# Patient Record
Sex: Female | Born: 1980 | Race: Black or African American | Hispanic: No | Marital: Single | Smoking: Former smoker
Health system: Southern US, Community
[De-identification: ages and names within clinical notes are randomized; demographics above are authoritative.]

## PROBLEM LIST (undated history)

## (undated) ENCOUNTER — Inpatient Hospital Stay (HOSPITAL_COMMUNITY): Payer: Self-pay

## (undated) DIAGNOSIS — F329 Major depressive disorder, single episode, unspecified: Secondary | ICD-10-CM

## (undated) DIAGNOSIS — F32A Depression, unspecified: Secondary | ICD-10-CM

## (undated) DIAGNOSIS — R7303 Prediabetes: Secondary | ICD-10-CM

## (undated) DIAGNOSIS — E669 Obesity, unspecified: Secondary | ICD-10-CM

## (undated) DIAGNOSIS — O119 Pre-existing hypertension with pre-eclampsia, unspecified trimester: Secondary | ICD-10-CM

## (undated) DIAGNOSIS — I10 Essential (primary) hypertension: Secondary | ICD-10-CM

## (undated) DIAGNOSIS — R0789 Other chest pain: Secondary | ICD-10-CM

## (undated) DIAGNOSIS — Z8619 Personal history of other infectious and parasitic diseases: Secondary | ICD-10-CM

## (undated) HISTORY — PX: DILATION AND CURETTAGE OF UTERUS: SHX78

## (undated) HISTORY — PX: WISDOM TOOTH EXTRACTION: SHX21

## (undated) HISTORY — DX: Pre-existing hypertension with pre-eclampsia, unspecified trimester: O11.9

## (undated) HISTORY — DX: Other chest pain: R07.89

## (undated) HISTORY — DX: Prediabetes: R73.03

## (undated) HISTORY — DX: Personal history of other infectious and parasitic diseases: Z86.19

## (undated) HISTORY — PX: CHOLECYSTECTOMY: SHX55

---
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
## 2000-01-21 ENCOUNTER — Encounter: Payer: Self-pay | Admitting: Emergency Medicine

## 2000-01-21 ENCOUNTER — Emergency Department (HOSPITAL_COMMUNITY): Admission: EM | Admit: 2000-01-21 | Discharge: 2000-01-21 | Payer: Self-pay | Admitting: Emergency Medicine

## 2000-06-14 ENCOUNTER — Emergency Department (HOSPITAL_COMMUNITY): Admission: EM | Admit: 2000-06-14 | Discharge: 2000-06-15 | Payer: Self-pay | Admitting: Emergency Medicine

## 2001-05-13 ENCOUNTER — Ambulatory Visit (HOSPITAL_COMMUNITY): Admission: RE | Admit: 2001-05-13 | Discharge: 2001-05-13 | Payer: Self-pay | Admitting: *Deleted

## 2001-10-01 ENCOUNTER — Inpatient Hospital Stay (HOSPITAL_COMMUNITY): Admission: AD | Admit: 2001-10-01 | Discharge: 2001-10-01 | Payer: Self-pay | Admitting: *Deleted

## 2001-10-16 ENCOUNTER — Inpatient Hospital Stay (HOSPITAL_COMMUNITY): Admission: AD | Admit: 2001-10-16 | Discharge: 2001-10-16 | Payer: Self-pay | Admitting: *Deleted

## 2001-10-16 ENCOUNTER — Encounter: Payer: Self-pay | Admitting: *Deleted

## 2001-10-23 ENCOUNTER — Encounter (HOSPITAL_COMMUNITY): Admission: RE | Admit: 2001-10-23 | Discharge: 2001-10-23 | Payer: Self-pay | Admitting: *Deleted

## 2001-10-23 ENCOUNTER — Inpatient Hospital Stay (HOSPITAL_COMMUNITY): Admission: AD | Admit: 2001-10-23 | Discharge: 2001-10-25 | Payer: Self-pay | Admitting: *Deleted

## 2002-06-23 ENCOUNTER — Ambulatory Visit (HOSPITAL_COMMUNITY): Admission: RE | Admit: 2002-06-23 | Discharge: 2002-06-23 | Payer: Self-pay | Admitting: Obstetrics and Gynecology

## 2002-12-20 ENCOUNTER — Inpatient Hospital Stay (HOSPITAL_COMMUNITY): Admission: AD | Admit: 2002-12-20 | Discharge: 2002-12-20 | Payer: Self-pay | Admitting: *Deleted

## 2002-12-20 ENCOUNTER — Encounter: Payer: Self-pay | Admitting: *Deleted

## 2002-12-23 ENCOUNTER — Inpatient Hospital Stay (HOSPITAL_COMMUNITY): Admission: AD | Admit: 2002-12-23 | Discharge: 2002-12-25 | Payer: Self-pay | Admitting: *Deleted

## 2003-07-28 ENCOUNTER — Emergency Department (HOSPITAL_COMMUNITY): Admission: EM | Admit: 2003-07-28 | Discharge: 2003-07-28 | Payer: Self-pay | Admitting: Emergency Medicine

## 2003-09-29 ENCOUNTER — Ambulatory Visit (HOSPITAL_COMMUNITY): Admission: RE | Admit: 2003-09-29 | Discharge: 2003-09-29 | Payer: Self-pay | Admitting: Obstetrics & Gynecology

## 2003-11-29 ENCOUNTER — Ambulatory Visit (HOSPITAL_COMMUNITY): Admission: RE | Admit: 2003-11-29 | Discharge: 2003-11-29 | Payer: Self-pay | Admitting: Obstetrics & Gynecology

## 2004-04-09 ENCOUNTER — Inpatient Hospital Stay (HOSPITAL_COMMUNITY): Admission: AD | Admit: 2004-04-09 | Discharge: 2004-04-09 | Payer: Self-pay | Admitting: Obstetrics

## 2004-04-26 ENCOUNTER — Inpatient Hospital Stay (HOSPITAL_COMMUNITY): Admission: AD | Admit: 2004-04-26 | Discharge: 2004-04-28 | Payer: Self-pay | Admitting: Obstetrics & Gynecology

## 2004-05-11 ENCOUNTER — Inpatient Hospital Stay (HOSPITAL_COMMUNITY): Admission: AD | Admit: 2004-05-11 | Discharge: 2004-05-11 | Payer: Self-pay | Admitting: Obstetrics

## 2008-01-30 ENCOUNTER — Ambulatory Visit: Payer: Self-pay | Admitting: Internal Medicine

## 2008-01-30 ENCOUNTER — Observation Stay (HOSPITAL_COMMUNITY): Admission: EM | Admit: 2008-01-30 | Discharge: 2008-01-31 | Payer: Self-pay | Admitting: Emergency Medicine

## 2008-01-30 ENCOUNTER — Ambulatory Visit: Payer: Self-pay | Admitting: Infectious Disease

## 2008-02-24 ENCOUNTER — Encounter (INDEPENDENT_AMBULATORY_CARE_PROVIDER_SITE_OTHER): Payer: Self-pay | Admitting: Internal Medicine

## 2008-02-24 ENCOUNTER — Ambulatory Visit: Payer: Self-pay | Admitting: Infectious Diseases

## 2008-02-24 DIAGNOSIS — J45901 Unspecified asthma with (acute) exacerbation: Secondary | ICD-10-CM | POA: Insufficient documentation

## 2008-02-24 DIAGNOSIS — R7309 Other abnormal glucose: Secondary | ICD-10-CM | POA: Insufficient documentation

## 2008-02-25 ENCOUNTER — Encounter (INDEPENDENT_AMBULATORY_CARE_PROVIDER_SITE_OTHER): Payer: Self-pay | Admitting: Internal Medicine

## 2008-02-25 LAB — CONVERTED CEMR LAB
ALT: 14 U/L
AST: 11 U/L
Albumin: 3.9 g/dL
Alkaline Phosphatase: 66 U/L
BUN: 11 mg/dL
Basophils Absolute: 0 K/uL
Basophils Relative: 0 %
CO2: 25 meq/L
Calcium: 9.2 mg/dL
Chloride: 104 meq/L
Creatinine, Ser: 0.64 mg/dL
Eosinophils Absolute: 0.7 K/uL
Eosinophils Relative: 9 % — ABNORMAL HIGH
Glucose, Bld: 81 mg/dL
HCT: 37.3 %
Hemoglobin: 11.5 g/dL — ABNORMAL LOW
Lymphocytes Relative: 36 %
Lymphs Abs: 2.8 K/uL
MCHC: 30.8 g/dL
MCV: 84.8 fL
Monocytes Absolute: 0.4 K/uL
Monocytes Relative: 6 %
Neutro Abs: 3.8 K/uL
Neutrophils Relative %: 49 %
Platelets: 380 K/uL
Potassium: 4.2 meq/L
RBC: 4.4 M/uL
RDW: 13.9 %
Sodium: 140 meq/L
TSH: 1.085 u[IU]/mL
Total Bilirubin: 0.3 mg/dL
Total Protein: 6.6 g/dL
WBC: 7.8 10*3/microliter

## 2008-02-26 ENCOUNTER — Encounter (INDEPENDENT_AMBULATORY_CARE_PROVIDER_SITE_OTHER): Payer: Self-pay | Admitting: Internal Medicine

## 2008-03-18 ENCOUNTER — Telehealth (INDEPENDENT_AMBULATORY_CARE_PROVIDER_SITE_OTHER): Payer: Self-pay | Admitting: Internal Medicine

## 2008-07-03 ENCOUNTER — Emergency Department (HOSPITAL_COMMUNITY): Admission: EM | Admit: 2008-07-03 | Discharge: 2008-07-03 | Payer: Self-pay | Admitting: Emergency Medicine

## 2008-07-05 ENCOUNTER — Ambulatory Visit (HOSPITAL_COMMUNITY): Admission: RE | Admit: 2008-07-05 | Discharge: 2008-07-05 | Payer: Self-pay | Admitting: *Deleted

## 2008-07-05 ENCOUNTER — Ambulatory Visit: Payer: Self-pay | Admitting: *Deleted

## 2008-07-05 DIAGNOSIS — S335XXA Sprain of ligaments of lumbar spine, initial encounter: Secondary | ICD-10-CM

## 2008-07-05 DIAGNOSIS — S339XXA Sprain of unspecified parts of lumbar spine and pelvis, initial encounter: Secondary | ICD-10-CM | POA: Insufficient documentation

## 2009-01-21 ENCOUNTER — Emergency Department (HOSPITAL_COMMUNITY): Admission: EM | Admit: 2009-01-21 | Discharge: 2009-01-21 | Payer: Self-pay | Admitting: Family Medicine

## 2009-02-17 ENCOUNTER — Telehealth: Payer: Self-pay | Admitting: Internal Medicine

## 2009-02-17 ENCOUNTER — Emergency Department (HOSPITAL_COMMUNITY): Admission: EM | Admit: 2009-02-17 | Discharge: 2009-02-17 | Payer: Self-pay | Admitting: Emergency Medicine

## 2009-07-14 ENCOUNTER — Emergency Department (HOSPITAL_COMMUNITY): Admission: EM | Admit: 2009-07-14 | Discharge: 2009-07-14 | Payer: Self-pay | Admitting: Family Medicine

## 2009-08-18 ENCOUNTER — Emergency Department (HOSPITAL_COMMUNITY): Admission: EM | Admit: 2009-08-18 | Discharge: 2009-08-18 | Payer: Self-pay | Admitting: Family Medicine

## 2010-02-05 ENCOUNTER — Ambulatory Visit: Payer: Self-pay | Admitting: Nurse Practitioner

## 2010-02-05 ENCOUNTER — Inpatient Hospital Stay (HOSPITAL_COMMUNITY): Admission: AD | Admit: 2010-02-05 | Discharge: 2010-02-05 | Payer: Self-pay | Admitting: Obstetrics and Gynecology

## 2010-03-14 ENCOUNTER — Ambulatory Visit (HOSPITAL_COMMUNITY)
Admission: RE | Admit: 2010-03-14 | Discharge: 2010-03-14 | Payer: Self-pay | Source: Home / Self Care | Admitting: Obstetrics & Gynecology

## 2010-04-02 IMAGING — CR DG CHEST 2V
2 series · 2 of 2 positions shown · non-contrast
Comparison: 07/03/2008

CLINICAL DATA: Cough.  Shortness of breath.  Asthma.

CHEST - 2 VIEW

[view not recorded (1 of 2)]
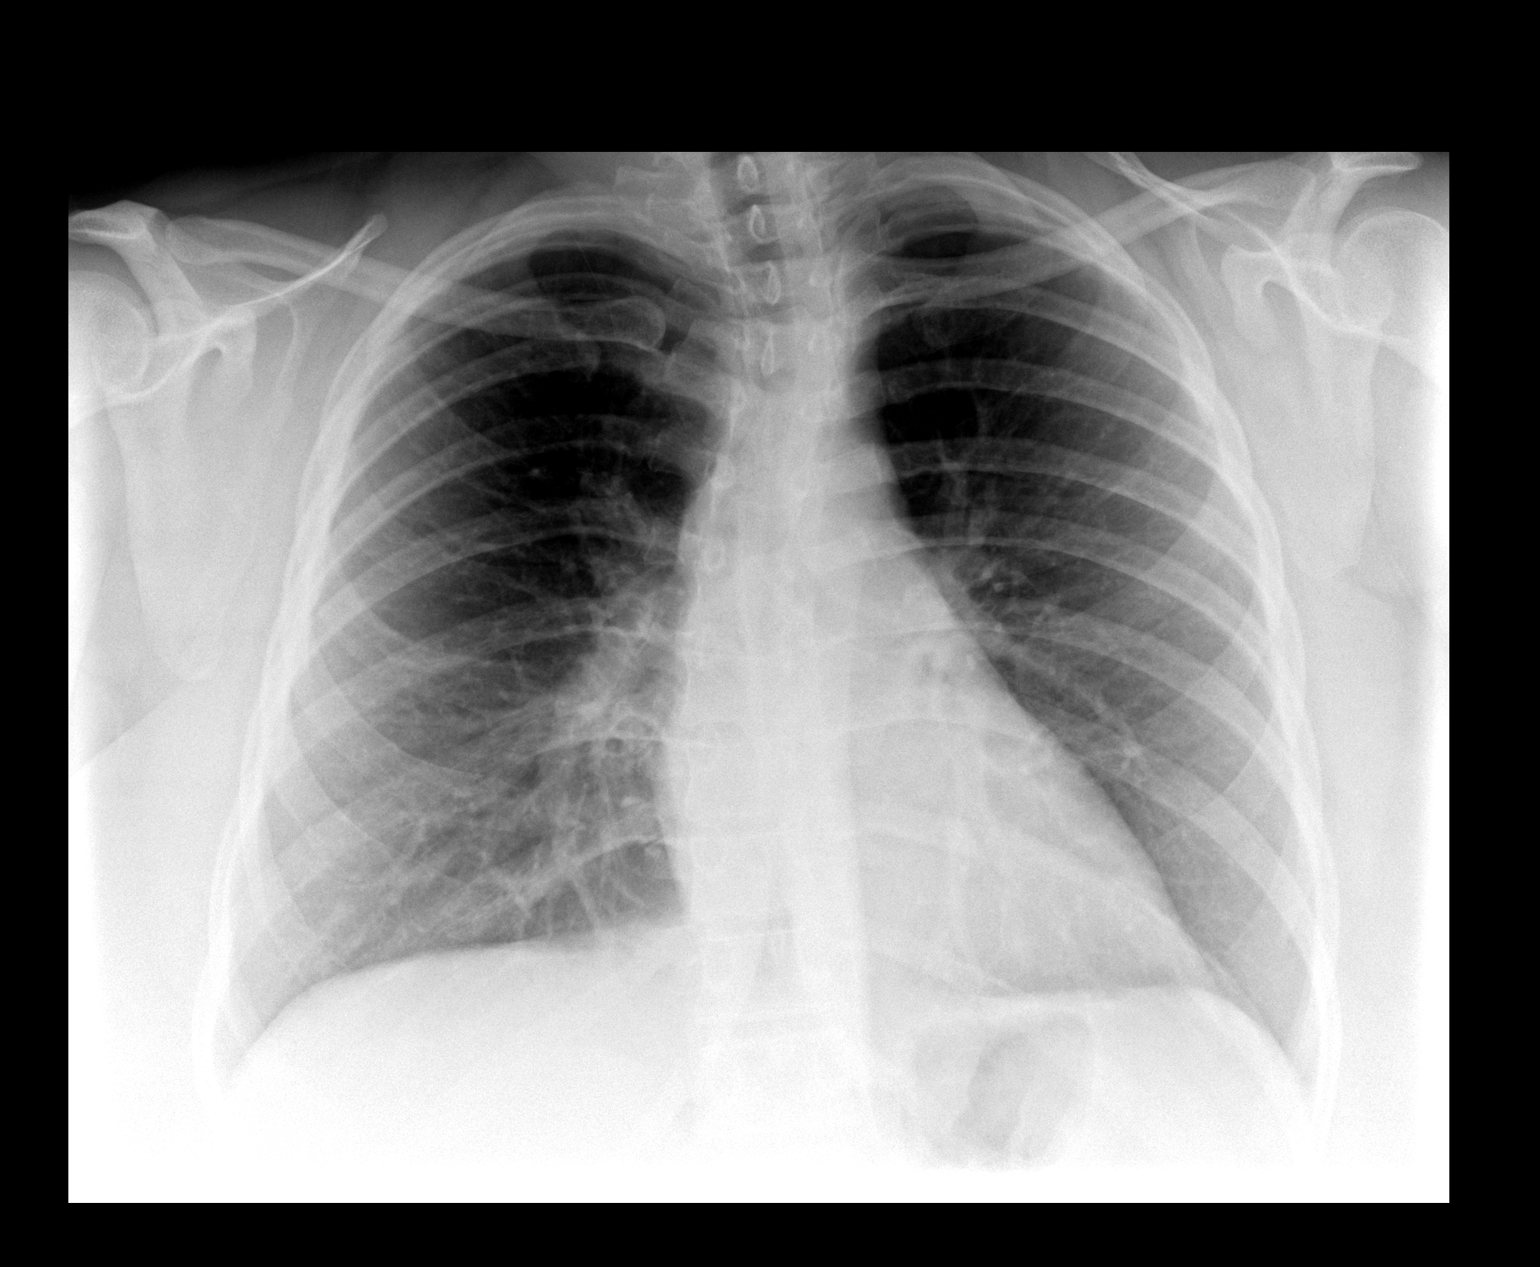

[view not recorded (2 of 2)]
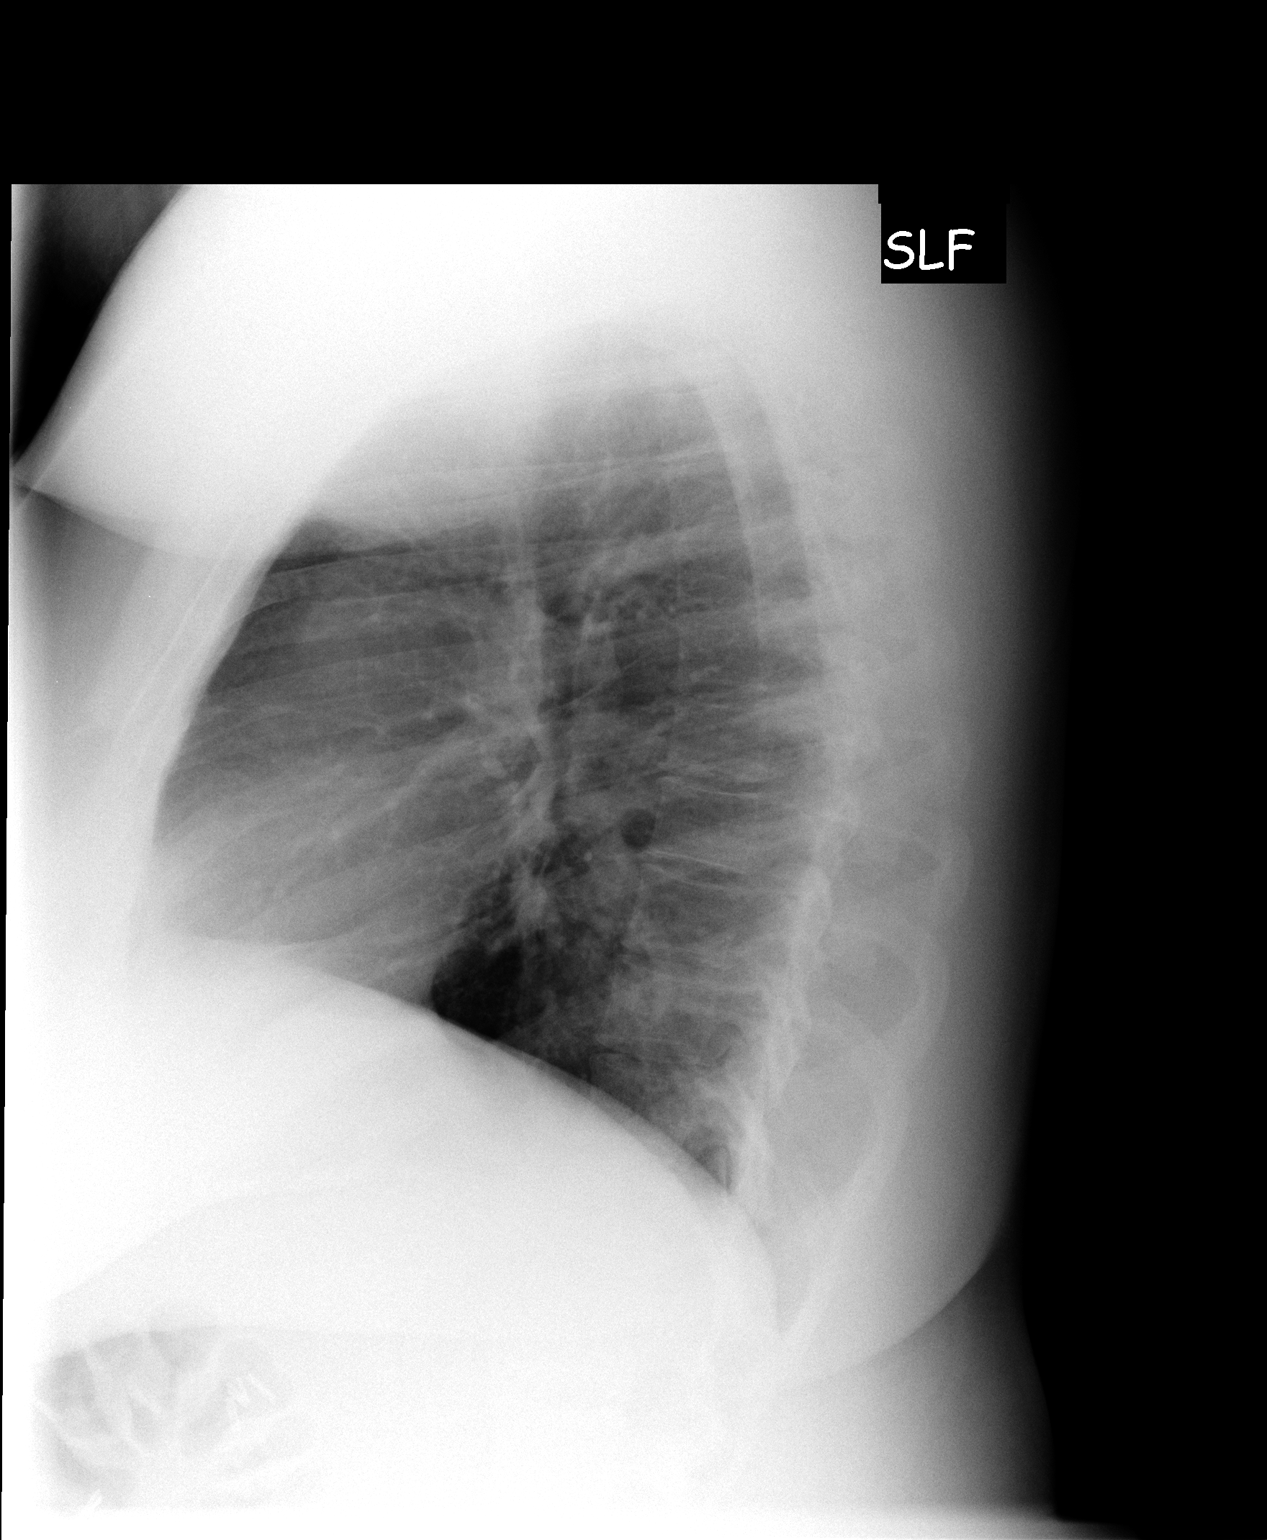

[2 of 2 positions shown; findings below may reference images not displayed]

FINDINGS: Heart size and mediastinal contours are normal.  Both
lungs are clear.  No evidence of pleural effusion.  No mass or
adenopathy identified.
IMPRESSION: Stable exam.  No active disease.

## 2010-04-29 ENCOUNTER — Encounter: Payer: Self-pay | Admitting: Obstetrics & Gynecology

## 2010-05-04 ENCOUNTER — Ambulatory Visit (HOSPITAL_COMMUNITY)
Admission: RE | Admit: 2010-05-04 | Discharge: 2010-05-04 | Payer: Self-pay | Source: Home / Self Care | Attending: Obstetrics | Admitting: Obstetrics

## 2010-06-20 LAB — URINALYSIS, ROUTINE W REFLEX MICROSCOPIC
Hgb urine dipstick: NEGATIVE
Ketones, ur: NEGATIVE mg/dL
Nitrite: NEGATIVE
Urobilinogen, UA: 0.2 mg/dL (ref 0.0–1.0)

## 2010-06-20 LAB — POCT PREGNANCY, URINE: Preg Test, Ur: POSITIVE

## 2010-06-26 LAB — POCT I-STAT, CHEM 8
BUN: 12 mg/dL (ref 6–23)
Chloride: 104 mEq/L (ref 96–112)
Glucose, Bld: 81 mg/dL (ref 70–99)
HCT: 38 % (ref 36.0–46.0)
Potassium: 4.3 mEq/L (ref 3.5–5.1)
Sodium: 142 mEq/L (ref 135–145)

## 2010-06-26 LAB — POCT URINALYSIS DIP (DEVICE)
Ketones, ur: NEGATIVE mg/dL
pH: 6 (ref 5.0–8.0)

## 2010-06-26 LAB — POCT PREGNANCY, URINE: Preg Test, Ur: NEGATIVE

## 2010-07-19 LAB — CBC
MCV: 82.1 fL (ref 78.0–100.0)
RBC: 4.02 MIL/uL (ref 3.87–5.11)
RDW: 14.2 % (ref 11.5–15.5)
WBC: 7.7 10*3/uL (ref 4.0–10.5)

## 2010-07-19 LAB — DIFFERENTIAL
Basophils Relative: 1 % (ref 0–1)
Eosinophils Relative: 3 % (ref 0–5)
Lymphocytes Relative: 26 % (ref 12–46)
Monocytes Relative: 6 % (ref 3–12)
Neutro Abs: 4.9 10*3/uL (ref 1.7–7.7)
Neutrophils Relative %: 64 % (ref 43–77)

## 2010-07-19 LAB — COMPREHENSIVE METABOLIC PANEL
AST: 18 U/L (ref 0–37)
CO2: 29 mEq/L (ref 19–32)
Calcium: 8.9 mg/dL (ref 8.4–10.5)
Chloride: 102 mEq/L (ref 96–112)
Creatinine, Ser: 0.6 mg/dL (ref 0.4–1.2)
GFR calc non Af Amer: >60 mL/min (ref >60)
Sodium: 135 mEq/L (ref 135–145)

## 2010-07-19 LAB — URINALYSIS, ROUTINE W REFLEX MICROSCOPIC
Bilirubin Urine: NEGATIVE
Nitrite: NEGATIVE
pH: 6 (ref 5.0–8.0)

## 2010-08-24 ENCOUNTER — Inpatient Hospital Stay (HOSPITAL_COMMUNITY)
Admission: AD | Admit: 2010-08-24 | Discharge: 2010-08-24 | Disposition: A | Discharge status: Home / Self Care | Payer: Self-pay | Source: Ambulatory Visit | Attending: Obstetrics | Admitting: Obstetrics

## 2010-08-24 DIAGNOSIS — O212 Late vomiting of pregnancy: Secondary | ICD-10-CM | POA: Insufficient documentation

## 2010-08-24 DIAGNOSIS — M545 Low back pain, unspecified: Secondary | ICD-10-CM | POA: Insufficient documentation

## 2010-08-24 LAB — URINALYSIS, ROUTINE W REFLEX MICROSCOPIC
Bilirubin Urine: NEGATIVE
Glucose, UA: NEGATIVE mg/dL
Hgb urine dipstick: NEGATIVE
Ketones, ur: NEGATIVE mg/dL
Protein, ur: NEGATIVE mg/dL

## 2010-08-24 NOTE — Discharge Summary (Signed)
Burns, Crystal NO.:  [id number]   MEDICAL RECORD NO.:  [id number]          PATIENT TYPE:  OBV   LOCATION:  5003                         FACILITY:  [facility]   PHYSICIAN:  Acey Lav, MD  DATE OF BIRTH:  [birth year]   DATE OF ADMISSION:  01/30/2008  DATE OF DISCHARGE:  01/31/2008                               DISCHARGE SUMMARY   DISCHARGE DIAGNOSES:  1. Shortness of breath likely secondary to acute upper respiratory      illness/bronchitis with asthmatic component.  2. Hypertension.  3. Likely prediabetic as the patient had a hemoglobin A1c of 6.1.   DISCHARGE MEDICATIONS:  1. p.o. azithromycin 250 mg daily x4 days.  2. p.o. Tamiflu 75 mg b.i.d. x5 days.  3. p.o. prednisone 60 mg daily x5 days.  4. Symbicort inhaler 160/4.5 one puff b.i.d.  This medication was      provided by urgent care.  5. Ventolin 90 mcg inhaler 2 puffs q.6 h. p.r.n. shortness of breath.      This medication was provided by urgent care.   CONDITION ON DISCHARGE:  The patient's dyspnea had improved  substantially with the albuterol and Atrovent nebulizers as well as the  IV Solu-Medrol that the patient had received during the hospitalization.  The patient is to follow up with Dr. Elby Showers at the outpatient  clinic on February 24, 2008, at 1:30 p.m.  She has a BMET scheduled for  that time.  Please evaluate the patient's recovery from the upper  respiratory illness/asthma exacerbation.  Please also evaluate her for  hypertension as she had been started on Cozaar by the urgent care that  she had seen prior to hospitalization.  Please also follow her in the  outpatient clinic for potential diabetes as the patient's hemoglobin A1c  was 6.1.   PROCEDURES:  The patient had a peak flow done at [facility] on  the morning of January 31, 2008.  The patient had a chest x-ray done  that showed no acute cardiopulmonary process.  Low volume exam.   CONSULTATIONS:  None.   ADMISSION HISTORY AND PHYSICAL:  The patient is a 30 year old female  smoker with past medical history significant for hypertension, once-a-  year bronchitis, no asthma history, who presents with shortness of  breath, cough with productive yellow sputum, rhinorrhea, and sneezing x3  days.  The patient denies any fevers or chills or diffuse myalgias.  The  patient seen at urgent care 2 days prior to admission and diagnosed with  acute bronchitis/possible asthma and prescribed amoxicillin, Symbicort,  and Ventolin inhalers.  The patient also took daughter's amoxicillin.  The patient's only sick contact is her daughter, who had the flu 2 weeks  prior to admission.  The patient works as a Conservation officer, nature at [employer].  The  patient became acutely worse on the morning of admission and this  prompted her to come to the emergency department.   PHYSICAL EXAMINATION:  VITAL SIGNS:  Temperature of 97.5, blood pressure  of 93/63, it came up to 103/72 in the ED.  Pulse of 105, respiratory  rate of 22,  and O2 sats of 98% on 4 L.  GENERAL APPEARANCE:  Short of breath, obese young female.  HEENT:  Eyes, anicteric.  Extraocular muscles intact.  ENT, moist  membranous mucosa.  RESPIRATORY:  Decreased air movement.  Mild inspiratory and expiratory  wheezes.  Use of accessory muscles.  No crackles.  No rhonchi.  CARDIOVASCULAR:  Tachycardic, regular rhythm.  No murmurs, rubs, or  gallops.  GI:  Obese, nontender, normoactive bowel sounds.  EXTREMITIES:  No cyanosis, clubbing, or edema.  NEUROLOGIC:  Nonfocal.   INITIAL LABORATORY DATA:  The patient's initial CMET; sodium of 137,  potassium 4.3, chloride of 101, bicarbonate 27, glucose 133, BUN of 8,  creatinine of 0.65.  Alkaline phosphatase 77, AST 23, ALT 28.  Calcium  9.2.  Initial CBC; white blood cell count of 12.1, hemoglobin 11.9,  hematocrit 35.5, platelet count of 296.  Venous blood gas, pH 7.37, PCO2  of 47.1, PO2 of 161, bicarbonate 27.6, and O2  saturation of 99.0.   HOSPITAL COURSE:  1. Upper respiratory illness with likely asthmatic component:  The      patient's chest x-ray did not show any focal infiltrate, making a      pneumonia much less likely.  Given her symptoms and progression of      her disease, it sounded like an upper respiratory illness from a      virus etiology was most likely.  She was covered with Tamiflu and      we will continue with that as an outpatient for a full 5-day      course.  Given the possibility of bronchitis or atypical pneumonia,      although less likely, she was started on azithromycin and we will      continue with that as an outpatient.  Because the patient exhibited      wheezing on physical exam, she was started on Atrovent and      albuterol nebulizers q.4 h. scheduled and q.2 h. p.r.n.  With this,      she improved dramatically overnight and was close to her baseline      upon discharge on January 31, 2008.  Given that she has no history      of asthma, but has once-a-year episodes of bronchitis, she should      be evaluated as an outpatient for asthma with PFTs.  2. Hypertension:  The patient's initial blood pressure was borderline      low at 103/72, however, her blood pressure did increase to as high      as 149/94.  She had been prescribed Cozaar as an outpatient at the      urgent care.  However, this may not be the best first line      treatment for hypertension, and she should be reevaluated on an      outpatient basis for continued care.  3. Hemoglobin A1c of 6.1:  The patient does not have a history of      diabetes.  However, she has multiple risk factors including family      history significant for diabetes as well as obesity.  She should be      evaluated as an outpatient for her potential diabetes and treated      with lifestyle changes or medications accordingly.   DISCHARGE VITALS:  The patient's vitals on discharge, temperature 98.7,  pulse of 88, respiratory rate of  18, saturations of 97%-100% on room  air, and blood  pressure of 133/84.   DISCHARGE LABS:  BMET; sodium 138, potassium 4.0, chloride 106,  bicarbonate 23, glucose 159, BUN 7, creatinine 0.58.  CBC; white blood  cell count 24.7, likely secondary to steroids, hemoglobin 1.4,  hematocrit 34.3, and platelets of 323.   PENDING LABS:  There are no pending labs at this time.      Linward Foster, MD  Electronically Signed      Acey Lav, MD  Electronically Signed    LW/MEDQ  D:  02/01/2008  T:  02/02/2008  Job:  [id number]   cc:   Elby Showers, MD

## 2010-09-06 ENCOUNTER — Other Ambulatory Visit: Payer: Self-pay | Admitting: Obstetrics & Gynecology

## 2010-09-06 DIAGNOSIS — I10 Essential (primary) hypertension: Secondary | ICD-10-CM

## 2010-09-11 ENCOUNTER — Ambulatory Visit (HOSPITAL_COMMUNITY)
Admission: RE | Admit: 2010-09-11 | Discharge: 2010-09-11 | Disposition: A | Discharge status: Home / Self Care | Payer: Medicaid Other | Source: Ambulatory Visit | Attending: Obstetrics & Gynecology | Admitting: Obstetrics & Gynecology

## 2010-09-11 ENCOUNTER — Other Ambulatory Visit: Payer: Self-pay | Admitting: Obstetrics & Gynecology

## 2010-09-11 ENCOUNTER — Other Ambulatory Visit: Payer: Self-pay | Admitting: Obstetrics

## 2010-09-11 DIAGNOSIS — O09299 Supervision of pregnancy with other poor reproductive or obstetric history, unspecified trimester: Secondary | ICD-10-CM | POA: Insufficient documentation

## 2010-09-11 DIAGNOSIS — O24419 Gestational diabetes mellitus in pregnancy, unspecified control: Secondary | ICD-10-CM

## 2010-09-11 DIAGNOSIS — O365990 Maternal care for other known or suspected poor fetal growth, unspecified trimester, not applicable or unspecified: Secondary | ICD-10-CM | POA: Insufficient documentation

## 2010-09-11 DIAGNOSIS — O99810 Abnormal glucose complicating pregnancy: Secondary | ICD-10-CM | POA: Insufficient documentation

## 2010-09-11 DIAGNOSIS — I10 Essential (primary) hypertension: Secondary | ICD-10-CM

## 2010-09-11 DIAGNOSIS — O10019 Pre-existing essential hypertension complicating pregnancy, unspecified trimester: Secondary | ICD-10-CM | POA: Insufficient documentation

## 2010-09-11 DIAGNOSIS — E669 Obesity, unspecified: Secondary | ICD-10-CM | POA: Insufficient documentation

## 2010-09-18 ENCOUNTER — Inpatient Hospital Stay (HOSPITAL_COMMUNITY): Admission: RE | Admit: 2010-09-18 | Payer: Medicaid Other | Source: Ambulatory Visit

## 2010-09-26 ENCOUNTER — Inpatient Hospital Stay (HOSPITAL_COMMUNITY)
Admission: EM | Admit: 2010-09-26 | Discharge: 2010-09-28 | DRG: 775 | Disposition: A | Discharge status: Home / Self Care | Payer: Medicaid Other | Source: Ambulatory Visit | Attending: Obstetrics | Admitting: Obstetrics

## 2010-09-26 ENCOUNTER — Encounter (HOSPITAL_COMMUNITY): Payer: Self-pay | Admitting: *Deleted

## 2010-09-26 DIAGNOSIS — O99892 Other specified diseases and conditions complicating childbirth: Secondary | ICD-10-CM | POA: Diagnosis present

## 2010-09-26 DIAGNOSIS — O99814 Abnormal glucose complicating childbirth: Principal | ICD-10-CM | POA: Diagnosis present

## 2010-09-26 DIAGNOSIS — O24419 Gestational diabetes mellitus in pregnancy, unspecified control: Secondary | ICD-10-CM

## 2010-09-26 DIAGNOSIS — Z22330 Carrier of Group B streptococcus: Secondary | ICD-10-CM

## 2010-09-26 LAB — GLUCOSE, CAPILLARY: Glucose-Capillary: 105 mg/dL — ABNORMAL HIGH (ref 70–99)

## 2010-09-26 LAB — CBC
HCT: 31.2 % — ABNORMAL LOW (ref 36.0–46.0)
MCH: 24.7 pg — ABNORMAL LOW (ref 26.0–34.0)
MCV: 77.8 fL — ABNORMAL LOW (ref 78.0–100.0)
RDW: 15.5 % (ref 11.5–15.5)
WBC: 8.1 10*3/uL (ref 4.0–10.5)

## 2010-09-27 LAB — CBC
HCT: 31 % — ABNORMAL LOW (ref 36.0–46.0)
Platelets: 260 10*3/uL (ref 150–400)
RDW: 15.5 % (ref 11.5–15.5)
WBC: 11 10*3/uL — ABNORMAL HIGH (ref 4.0–10.5)

## 2010-09-27 LAB — ABO/RH: ABO/RH(D): O POS

## 2010-09-27 LAB — GLUCOSE, CAPILLARY: Glucose-Capillary: 72 mg/dL (ref 70–99)

## 2010-10-01 NOTE — H&P (Signed)
  NAMEWILHELMENA, ZEA NO.:  [id number]  MEDICAL RECORD NO.:  [id number]  LOCATION:  9163                          FACILITY:  [facility]  PHYSICIAN:  Roseanna Rainbow, M.D.DATE OF BIRTH:  Jun 10, 1980  DATE OF ADMISSION:  09/26/2010 DATE OF DISCHARGE:                             HISTORY & PHYSICAL   CHIEF COMPLAINT:  The patient is a 30 year old para 4 with an estimated date of confinement of October 03, 2010, with an intrauterine pregnancy at 37 plus weeks for induction of labor secondary to the gestational diabetes on glyburide.  HISTORY OF PRESENT ILLNESS:  Please see the above.  ALLERGIES:  No known drug allergies.  MEDICATIONS:  Glyburide.  OB RISK FACTORS:  History of chronic hypertension.  Please see the above.  The patient was initially on Aldomet for blood pressure control; however, the blood pressures nadir in the second trimester and the blood pressure medications were discontinued.  PRENATAL LABS:  Chlamydia probe negative.  Urine culture and sensitivity insignificant growth.  GC probe negative.  GBS positive on Aug 31, 2010. 2-hour GTT abnormal.  Hepatitis B surface antigen negative.  Hematocrit 36, hemoglobin 11.7.  Hemoglobin A1c December 2011 of 6, platelets 358,000.  Quad screen negative.  Blood type O positive.  Antibody screen negative.  RPR nonreactive.  Rubella immune.  Sickle cell negative.  PAST OB HISTORY:  In October 1999, she was delivered at term 5 pounds 10 ounces female vaginal delivery.  In February 2003, she was delivered at 41 weeks 7 pounds 10 ounces female vaginal delivery.  In May 2004, she was delivered a 39-week 6 pounds 6 ounces female vaginal delivery.  In January 2006, she was delivered at term 6 pounds 11 ounces female vaginal delivery.  SOCIAL HISTORY:  She denies any tobacco, ethanol, or drug abuse.  PAST MEDICAL HISTORY:  Please see the above; asthma, anemia.  PHYSICAL EXAMINATION:  Vital signs stable, afebrile.   Fetal heart tracing 140.  Moderate long-term variability.  Tocodynamometer no uterine contractions.  Vulva and vaginal exam per the RN.  ASSESSMENT:  Multipara at term with the pregnancy complicated by gestational diabetes on an oral agent.  The patient has been well- controlled.  The infant is AGA on recent ultrasound.  Other comorbidities; history of chronic hypertension, now currently without medications and normotensive.  Category one fetal heart tracing, unfavorable Bishop score.  GBS positive, penicillin GBS prophylaxis in labor.  PLAN:  Admission, two-stage induction of labor.  Continue glyburide during the wiping process.  Check CBGs.  ADA diet.  Anticipate a vaginal delivery.     Roseanna Rainbow, M.D.     Judee Clara  D:  09/26/2010  T:  09/26/2010  Job:  [id number]  Electronically Signed by Antionette Char M.D. on 10/01/2010 10:56:22 AM

## 2011-01-08 LAB — DIFFERENTIAL
Basophils Absolute: 0
Basophils Relative: 0
Eosinophils Absolute: 0.8 — ABNORMAL HIGH
Eosinophils Relative: 7 — ABNORMAL HIGH
Monocytes Absolute: 0.2
Monocytes Relative: 1 — ABNORMAL LOW
Neutro Abs: 9 — ABNORMAL HIGH

## 2011-01-08 LAB — POCT I-STAT 3, VENOUS BLOOD GAS (G3P V)
Acid-Base Excess: 2
Bicarbonate: 27.6 — ABNORMAL HIGH
pH, Ven: 7.376 — ABNORMAL HIGH

## 2011-01-08 LAB — POCT I-STAT, CHEM 8
Calcium, Ion: 1.14
Chloride: 104
Glucose, Bld: 127 — ABNORMAL HIGH
HCT: 37
Hemoglobin: 12.6
TCO2: 26

## 2011-01-08 LAB — CBC
HCT: 34.3 — ABNORMAL LOW
Hemoglobin: 11.9 — ABNORMAL LOW
MCHC: 33.1
MCHC: 33.5
MCV: 81.7
MCV: 82.9
RBC: 4.14
RBC: 4.35
RDW: 14
WBC: 12.1 — ABNORMAL HIGH
WBC: 24.7 — ABNORMAL HIGH

## 2011-01-08 LAB — BASIC METABOLIC PANEL
BUN: 7
CO2: 23
Chloride: 106
GFR calc Af Amer: >60
Potassium: 4

## 2011-01-08 LAB — COMPREHENSIVE METABOLIC PANEL
AST: 23
Albumin: 3.8
Alkaline Phosphatase: 77
BUN: 8
Chloride: 101
GFR calc Af Amer: >60
Potassium: 4.3
Total Protein: 7.2

## 2011-01-08 LAB — HEMOGLOBIN A1C: Mean Plasma Glucose: 128

## 2011-04-05 ENCOUNTER — Emergency Department (HOSPITAL_COMMUNITY)
Admission: EM | Admit: 2011-04-05 | Discharge: 2011-04-05 | Disposition: A | Discharge status: Home / Self Care | Payer: Medicaid Other | Attending: Emergency Medicine | Admitting: Emergency Medicine

## 2011-04-05 ENCOUNTER — Encounter (HOSPITAL_COMMUNITY): Payer: Self-pay

## 2011-04-05 DIAGNOSIS — R059 Cough, unspecified: Secondary | ICD-10-CM | POA: Insufficient documentation

## 2011-04-05 DIAGNOSIS — J3489 Other specified disorders of nose and nasal sinuses: Secondary | ICD-10-CM | POA: Insufficient documentation

## 2011-04-05 DIAGNOSIS — R220 Localized swelling, mass and lump, head: Secondary | ICD-10-CM | POA: Insufficient documentation

## 2011-04-05 DIAGNOSIS — R05 Cough: Secondary | ICD-10-CM | POA: Insufficient documentation

## 2011-04-05 DIAGNOSIS — R5381 Other malaise: Secondary | ICD-10-CM | POA: Insufficient documentation

## 2011-04-05 DIAGNOSIS — J069 Acute upper respiratory infection, unspecified: Secondary | ICD-10-CM | POA: Insufficient documentation

## 2011-04-05 DIAGNOSIS — H9209 Otalgia, unspecified ear: Secondary | ICD-10-CM | POA: Insufficient documentation

## 2011-04-05 DIAGNOSIS — R070 Pain in throat: Secondary | ICD-10-CM | POA: Insufficient documentation

## 2011-04-05 DIAGNOSIS — R062 Wheezing: Secondary | ICD-10-CM | POA: Insufficient documentation

## 2011-04-05 DIAGNOSIS — I10 Essential (primary) hypertension: Secondary | ICD-10-CM | POA: Insufficient documentation

## 2011-04-05 HISTORY — DX: Essential (primary) hypertension: I10

## 2011-04-05 MED ORDER — BENZONATATE 100 MG PO CAPS: 100.0000 mg | ORAL_CAPSULE | Freq: Three times a day (TID) | ORAL | Status: AC

## 2011-04-05 NOTE — ED Provider Notes (Signed)
History     CSN: [id number]  Arrival date & time 04/05/11  1535   First MD Initiated Contact with Patient 04/05/11 1603      Chief Complaint  Patient presents with  . Cough    (Consider location/radiation/quality/duration/timing/severity/associated sxs/prior treatment) HPI Comments: Patient with 48-hour history of nasal congestion, sore throat, cough. Patient denies fever or vomiting. She states that she has a history of bronchitis and has been having some wheezing. She has an albuterol inhaler at home that she has used twice. She also reports using various over-the-counter cough and congestion medications which give mild relief. The patient's daughters have been sick with similar symptoms over the past week.  Patient is a 30 y.o. female presenting with URI. The history is provided by the patient.  URI The primary symptoms include fatigue, ear pain, sore throat, cough and wheezing. Primary symptoms do not include fever, headaches, abdominal pain, nausea, vomiting, myalgias or rash. The current episode started 2 days ago. This is a new problem.  Symptoms associated with the illness include plugged ear sensation, sinus pressure, congestion and rhinorrhea. The illness is not associated with chills or facial pain.    Past Medical History  Diagnosis Date  . Hypertension     No past surgical history on file.  No family history on file.  History  Substance Use Topics  . Smoking status: Not on file  . Smokeless tobacco: Not on file  . Alcohol Use:     OB History    Grav Para Term Preterm Abortions TAB SAB Ect Mult Living   1               Review of Systems  Constitutional: Positive for fatigue. Negative for fever and chills.  HENT: Positive for ear pain, congestion, sore throat, rhinorrhea and sinus pressure.   Eyes: Negative for discharge.  Respiratory: Positive for cough and wheezing. Negative for shortness of breath.   Cardiovascular: Negative for chest pain.    Gastrointestinal: Negative for nausea, vomiting, abdominal pain, diarrhea and constipation.  Genitourinary: Negative for dysuria.  Musculoskeletal: Negative for myalgias.  Skin: Negative for rash.  Neurological: Negative for headaches.  Psychiatric/Behavioral: Negative for confusion.    Allergies  Review of patient's allergies indicates no known allergies.  Home Medications   Current Outpatient Rx  Name Route Sig Dispense Refill  . ACETAMINOPHEN-GUAIFENESIN 500-200 MG/15ML PO LIQD Oral Take 15 mLs by mouth every 4 (four) hours as needed. For cough and cold     . AMLODIPINE BESYLATE 5 MG PO TABS Oral Take 5 mg by mouth daily.      . AZITHROMYCIN 500 MG PO TABS Oral Take 500 mg by mouth daily.        BP 160/104  Pulse 91  Temp(Src) 97.1 F (36.2 C) (Oral)  Resp 24  SpO2 98%  Physical Exam  Nursing note and vitals reviewed. Constitutional: She is oriented to person, place, and time. She appears well-developed and well-nourished.  HENT:  Head: Normocephalic and atraumatic.  Right Ear: Tympanic membrane and external ear normal.  Left Ear: Tympanic membrane and external ear normal.  Nose: Mucosal edema and rhinorrhea present. Right sinus exhibits frontal sinus tenderness. Left sinus exhibits frontal sinus tenderness.  Mouth/Throat: Uvula is midline and mucous membranes are normal. Posterior oropharyngeal erythema present. No oropharyngeal exudate.  Eyes: Pupils are equal, round, and reactive to light. Right eye exhibits no discharge. Left eye exhibits no discharge.  Neck: Normal range of motion. Neck supple.  Cardiovascular: Normal rate and regular rhythm.  Exam reveals no gallop and no friction rub.   No murmur heard. Pulmonary/Chest: Effort normal and breath sounds normal. No respiratory distress. She has no wheezes.  Abdominal: Soft. There is no tenderness. There is no rebound and no guarding.  Musculoskeletal: Normal range of motion.  Neurological: She is alert and oriented  to person, place, and time.  Skin: Skin is warm and dry. No rash noted.  Psychiatric: She has a normal mood and affect.    ED Course  Procedures (including critical care time)  Labs Reviewed - No data to display No results found.   1. Upper respiratory tract infection   2. Hypertension     4:24 PM Pt seen and examined.   4:25 PM Patient counseled on supportive care for viral URI and s/s to return including worsening symptoms, persistent fever, persistent vomiting, or if they have any other concerns.  Urged to see PCP if symptoms persist for more than 3 days. Patient verbalizes understanding and agrees with plan.   4:25 PM Patient counseled on use of albuterol HFA.  Told to use 1-2 puffs q 4 hours as needed for SOB.    MDM  Patient with symptoms consistent with a viral syndrome.  Vitals are stable, no fever.  No signs of dehydration.  Lung exam normal, no signs of pneumonia.  Supportive therapy indicated with return if symptoms worsen.  Patient is stable and appears well for discharge. Asked patient to take her blood pressure medications as directed. Her hypertension today is likely a result of her not taking her blood pressure medications this morning. Do not suspect any complications of high blood pressure at this time.    Medical screening examination/treatment/procedure(s) were performed by non-physician practitioner and as supervising physician I was immediately available for consultation/collaboration. Osvaldo Human, M.D.      Eustace Moore [place], [place] 04/05/11 1637  Carleene Cooper III, MD 04/06/11 [phone]

## 2011-04-05 NOTE — ED Notes (Signed)
Pt reports cough/congestion x 3 days.  C/o chest pain due to cough.  Reports tactile fever--temp not taken at home.

## 2011-05-01 ENCOUNTER — Emergency Department (INDEPENDENT_AMBULATORY_CARE_PROVIDER_SITE_OTHER)
Admission: EM | Admit: 2011-05-01 | Discharge: 2011-05-01 | Disposition: A | Discharge status: Home / Self Care | Payer: Medicaid Other | Source: Home / Self Care | Attending: Emergency Medicine | Admitting: Emergency Medicine

## 2011-05-01 DIAGNOSIS — J45901 Unspecified asthma with (acute) exacerbation: Secondary | ICD-10-CM

## 2011-05-01 MED ORDER — PREDNISONE 20 MG PO TABS
ORAL_TABLET | ORAL | Status: AC
  Filled 2011-05-01: qty 3

## 2011-05-01 MED ORDER — PREDNISONE 20 MG PO TABS: 40.0000 mg | ORAL_TABLET | Freq: Every day | ORAL | Status: AC

## 2011-05-01 MED ORDER — PREDNISONE 20 MG PO TABS
60.0000 mg | ORAL_TABLET | Freq: Once | ORAL | Status: AC
  Administered 2011-05-01: 60 mg via ORAL

## 2011-05-01 MED ORDER — ALBUTEROL SULFATE (5 MG/ML) 0.5% IN NEBU
5.0000 mg | INHALATION_SOLUTION | Freq: Once | RESPIRATORY_TRACT | Status: AC
  Administered 2011-05-01: 5 mg via RESPIRATORY_TRACT

## 2011-05-01 MED ORDER — ALBUTEROL SULFATE (5 MG/ML) 0.5% IN NEBU: 5.0000 mg | INHALATION_SOLUTION | Freq: Once | RESPIRATORY_TRACT | Status: DC

## 2011-05-01 MED ORDER — ALBUTEROL SULFATE (5 MG/ML) 0.5% IN NEBU
INHALATION_SOLUTION | RESPIRATORY_TRACT | Status: AC
  Filled 2011-05-01: qty 1

## 2011-05-01 MED ORDER — IPRATROPIUM BROMIDE 0.02 % IN SOLN
0.5000 mg | Freq: Once | RESPIRATORY_TRACT | Status: AC
  Administered 2011-05-01: 0.5 mg via RESPIRATORY_TRACT

## 2011-05-01 NOTE — ED Provider Notes (Signed)
History     CSN: [id number]  Arrival date & time 05/01/11  0830   First MD Initiated Contact with Patient 05/01/11 [phone]      Chief Complaint  Patient presents with  . Asthma    (Consider location/radiation/quality/duration/timing/severity/associated sxs/prior treatment) HPI Comments: Been wheezing and short of breath with tightness for the last 2 days, but this month been getting asthma almost every week, if its not a cold its the cold weather, have a neb machine at home, been using it" Feel a bit of congestion, no fevers, woke up this morning around 3-4 am and use the machine cause couldn't breath well"  Patient is a 31 y.o. female presenting with wheezing. The history is provided by the patient.  Wheezing  The current episode started 2 days ago. The problem occurs frequently. The problem has been gradually worsening. The problem is moderate. The symptoms are relieved by rest and beta-agonist inhalers. The symptoms are aggravated by activity (cold weather and when i get a cold"). Associated symptoms include cough, shortness of breath and wheezing. Pertinent negatives include no fever.    Past Medical History  Diagnosis Date  . Hypertension     No past surgical history on file.  No family history on file.  History  Substance Use Topics  . Smoking status: Not on file  . Smokeless tobacco: Not on file  . Alcohol Use:     OB History    Grav Para Term Preterm Abortions TAB SAB Ect Mult Living   1               Review of Systems  Constitutional: Negative for fever, diaphoresis, activity change and appetite change.  Respiratory: Positive for cough, shortness of breath and wheezing.     Allergies  Review of patient's allergies indicates no known allergies.  Home Medications   Current Outpatient Rx  Name Route Sig Dispense Refill  . ACETAMINOPHEN-GUAIFENESIN 500-200 MG/15ML PO LIQD Oral Take 15 mLs by mouth every 4 (four) hours as needed. For cough and cold     .  ALBUTEROL SULFATE (5 MG/ML) 0.5% IN NEBU Nebulization Take 1 mL (5 mg total) by nebulization once. 20 mL 1  . AMLODIPINE BESYLATE 5 MG PO TABS Oral Take 5 mg by mouth daily.      . AZITHROMYCIN 500 MG PO TABS Oral Take 500 mg by mouth daily.      Marland Kitchen PREDNISONE 20 MG PO TABS Oral Take 2 tablets (40 mg total) by mouth daily. 2 tablets daily for 5 days 10 tablet 0    BP 125/85  Pulse 75  Temp(Src) 97.3 F (36.3 C) (Oral)  Resp 20  SpO2 97%  Physical Exam  Nursing note and vitals reviewed. Constitutional: No distress.  HENT:  Head: Normocephalic.  Eyes: Conjunctivae are normal.  Neck: Normal range of motion.  Cardiovascular: Normal rate and regular rhythm.   Pulmonary/Chest: Effort normal. No accessory muscle usage. Not tachypneic. She has decreased breath sounds. She has wheezes in the right upper field, the right middle field, the right lower field, the left upper field, the left middle field and the left lower field. She has no rhonchi. She has no rales.  Lymphadenopathy:    She has no cervical adenopathy.  Skin: She is not diaphoretic.    ED Course  Procedures (including critical care time)  Labs Reviewed - No data to display No results found.   1. ASTHMA UNSPECIFIED WITH EXACERBATION  MDM  Asthma exacerbations last 48 hours- afebrile- comfortable global wheezing- mild distress- full sentences, oxygenating well        Jimmie Molly, MD 05/01/11 2020

## 2011-05-01 NOTE — ED Notes (Signed)
PT HERE WITH ASTHMA FLARE UP POST COLD/COUGH SX X 2 DYS.PT AHS HX ASTHMA AND HAS BEEN TAKING NEBS/INHALER BUT DIFF WITH SLEEPING,SOB AND WHEEZING

## 2011-06-18 ENCOUNTER — Other Ambulatory Visit: Payer: Self-pay | Admitting: Obstetrics & Gynecology

## 2011-06-18 DIAGNOSIS — O3680X0 Pregnancy with inconclusive fetal viability, not applicable or unspecified: Secondary | ICD-10-CM

## 2011-07-05 ENCOUNTER — Ambulatory Visit (HOSPITAL_COMMUNITY)
Admission: RE | Admit: 2011-07-05 | Discharge: 2011-07-05 | Disposition: A | Discharge status: Home / Self Care | Payer: Medicaid Other | Source: Ambulatory Visit | Attending: Obstetrics & Gynecology | Admitting: Obstetrics & Gynecology

## 2011-07-05 DIAGNOSIS — O3680X0 Pregnancy with inconclusive fetal viability, not applicable or unspecified: Secondary | ICD-10-CM

## 2011-07-05 DIAGNOSIS — O10019 Pre-existing essential hypertension complicating pregnancy, unspecified trimester: Secondary | ICD-10-CM | POA: Insufficient documentation

## 2011-07-05 DIAGNOSIS — Z3689 Encounter for other specified antenatal screening: Secondary | ICD-10-CM | POA: Insufficient documentation

## 2011-07-25 ENCOUNTER — Inpatient Hospital Stay (HOSPITAL_COMMUNITY)
Admission: AD | Admit: 2011-07-25 | Discharge: 2011-07-26 | Disposition: A | Discharge status: Home / Self Care | Payer: Medicaid Other | Source: Ambulatory Visit | Attending: Obstetrics & Gynecology | Admitting: Obstetrics & Gynecology

## 2011-07-25 ENCOUNTER — Encounter (HOSPITAL_COMMUNITY): Payer: Self-pay | Admitting: *Deleted

## 2011-07-25 DIAGNOSIS — O039 Complete or unspecified spontaneous abortion without complication: Secondary | ICD-10-CM

## 2011-07-25 DIAGNOSIS — O021 Missed abortion: Secondary | ICD-10-CM | POA: Insufficient documentation

## 2011-07-25 NOTE — MAU Note (Signed)
Pt reports she is 9 weeks preg and has had brownish discharge off/on. Today started having bleeding on the tissue when she wiped. Denies cramping at the time of bleeding , states she is cramping some now but she thinks it is because she is so nervous.

## 2011-07-26 ENCOUNTER — Inpatient Hospital Stay (HOSPITAL_COMMUNITY): Payer: Medicaid Other

## 2011-07-26 LAB — GC/CHLAMYDIA PROBE AMP, GENITAL: GC Probe Amp, Genital: NEGATIVE

## 2011-07-26 LAB — WET PREP, GENITAL: Yeast Wet Prep HPF POC: NONE SEEN

## 2011-07-26 NOTE — MAU Provider Note (Signed)
History     CSN: [id number]  Arrival date and time: 07/25/11 2307   First Provider Initiated Contact with Patient 07/26/11 0004      Chief Complaint  Patient presents with  . Vaginal Bleeding   HPI 31 y.o. [id number] at [redacted]w[redacted]d with brown spotting on and off since onset of pregnancy, some red bleeding with wiping today, no bleeding now, no pain. Had u/s around 6 weeks that showed fetal pole with ? FHR, no u/s since.    Past Medical History  Diagnosis Date  . Hypertension   . Asthma   . Gestational diabetes     2012    Past Surgical History  Procedure Date  . Dilation and curettage of uterus   . Cholecystectomy   . Tonsillectomy   . Wisdom tooth extraction     Family History  Problem Relation Age of Onset  . Hypertension Mother   . Diabetes Mother   . Cancer Mother   . Hypertension Father   . Diabetes Father   . Hyperlipidemia Father     History  Substance Use Topics  . Smoking status: Never Smoker   . Smokeless tobacco: Not on file  . Alcohol Use: No    Allergies: No Known Allergies  No prescriptions prior to admission    Review of Systems  Constitutional: Negative.   Respiratory: Negative.   Cardiovascular: Negative.   Gastrointestinal: Negative for nausea, vomiting, abdominal pain, diarrhea and constipation.  Genitourinary: Negative for dysuria, urgency, frequency, hematuria and flank pain.       Positive for vaginal bleeding  Musculoskeletal: Negative.   Neurological: Negative.   Psychiatric/Behavioral: Negative.    Physical Exam   Blood pressure 141/90, pulse 82, temperature 98.6 F (37 C), temperature source Oral, resp. rate 20, height 5\' 3"  (1.6 m), weight 253 lb (114.76 kg), SpO2 100.00%, unknown if currently breastfeeding.  Physical Exam  Vitals reviewed. Constitutional: She is oriented to person, place, and time. She appears well-developed and well-nourished. No distress.  HENT:  Head: Normocephalic and atraumatic.  Cardiovascular:  Normal rate, regular rhythm and normal heart sounds.   Respiratory: Effort normal and breath sounds normal. No respiratory distress.  GI: Soft. Bowel sounds are normal. She exhibits no distension and no mass. There is no tenderness. There is no rebound and no guarding.  Genitourinary: There is no rash or lesion on the right labia. There is no rash or lesion on the left labia. Uterus is not deviated, not enlarged, not fixed and not tender. Cervix exhibits no motion tenderness, no discharge and no friability. Right adnexum displays no mass, no tenderness and no fullness. Left adnexum displays no mass, no tenderness and no fullness. No erythema, tenderness or bleeding around the vagina. Vaginal discharge (brown) found.       Cervix closed  Neurological: She is alert and oriented to person, place, and time.  Skin: Skin is warm and dry.  Psychiatric: She has a normal mood and affect.    MAU Course  Procedures  Results for orders placed during the hospital encounter of 07/25/11 (from the past 24 hour(s))  WET PREP, GENITAL     Status: Abnormal   Collection Time   07/26/11 12:05 AM      Component Value Range   Yeast Wet Prep HPF POC NONE SEEN  NONE SEEN    Trich, Wet Prep NONE SEEN  NONE SEEN    Clue Cells Wet Prep HPF POC NONE SEEN  NONE SEEN    WBC,  Wet Prep HPF POC FEW (*) NONE SEEN    U/S: 7.2 week size irregular IUGS, yolk sac and fetal pole no longer seen Assessment and Plan  30 y.o. [id number] with missed AB F/U with Dr. Tamela Oddi tomorrow Precautions rev'd  Georges Mouse 07/26/2011, 3:34 AM

## 2011-12-05 LAB — OB RESULTS CONSOLE ABO/RH: RH Type: POSITIVE

## 2011-12-05 LAB — OB RESULTS CONSOLE HEPATITIS B SURFACE ANTIGEN: Hepatitis B Surface Ag: NEGATIVE

## 2011-12-05 LAB — OB RESULTS CONSOLE ANTIBODY SCREEN: Antibody Screen: NEGATIVE

## 2011-12-05 LAB — OB RESULTS CONSOLE RUBELLA ANTIBODY, IGM: Rubella: IMMUNE

## 2012-01-03 ENCOUNTER — Encounter (HOSPITAL_COMMUNITY): Payer: Self-pay

## 2012-01-03 ENCOUNTER — Ambulatory Visit (HOSPITAL_COMMUNITY)
Admission: RE | Admit: 2012-01-03 | Discharge: 2012-01-03 | Disposition: A | Discharge status: Home / Self Care | Payer: Medicaid Other | Source: Ambulatory Visit | Attending: Obstetrics | Admitting: Obstetrics

## 2012-01-03 ENCOUNTER — Other Ambulatory Visit: Payer: Self-pay | Admitting: Obstetrics

## 2012-01-03 DIAGNOSIS — O9989 Other specified diseases and conditions complicating pregnancy, childbirth and the puerperium: Secondary | ICD-10-CM

## 2012-01-03 DIAGNOSIS — O3680X0 Pregnancy with inconclusive fetal viability, not applicable or unspecified: Secondary | ICD-10-CM | POA: Insufficient documentation

## 2012-01-03 DIAGNOSIS — O368390 Maternal care for abnormalities of the fetal heart rate or rhythm, unspecified trimester, not applicable or unspecified: Secondary | ICD-10-CM | POA: Insufficient documentation

## 2012-02-07 ENCOUNTER — Encounter (HOSPITAL_COMMUNITY): Payer: Self-pay

## 2012-02-07 ENCOUNTER — Inpatient Hospital Stay (HOSPITAL_COMMUNITY)
Admission: AD | Admit: 2012-02-07 | Discharge: 2012-02-07 | Disposition: A | Discharge status: Home / Self Care | Payer: Medicaid Other | Source: Ambulatory Visit | Attending: Obstetrics & Gynecology | Admitting: Obstetrics & Gynecology

## 2012-02-07 DIAGNOSIS — O210 Mild hyperemesis gravidarum: Secondary | ICD-10-CM | POA: Insufficient documentation

## 2012-02-07 DIAGNOSIS — O219 Vomiting of pregnancy, unspecified: Secondary | ICD-10-CM

## 2012-02-07 DIAGNOSIS — R51 Headache: Secondary | ICD-10-CM | POA: Insufficient documentation

## 2012-02-07 HISTORY — DX: Depression, unspecified: F32.A

## 2012-02-07 HISTORY — DX: Major depressive disorder, single episode, unspecified: F32.9

## 2012-02-07 LAB — URINALYSIS, ROUTINE W REFLEX MICROSCOPIC
Glucose, UA: NEGATIVE mg/dL
Ketones, ur: NEGATIVE mg/dL
Leukocytes, UA: NEGATIVE
pH: 7 (ref 5.0–8.0)

## 2012-02-07 MED ORDER — PROMETHAZINE HCL 25 MG/ML IJ SOLN
25.0000 mg | Freq: Once | INTRAMUSCULAR | Status: AC
  Administered 2012-02-07: 25 mg via INTRAMUSCULAR
  Filled 2012-02-07: qty 1

## 2012-02-07 MED ORDER — PROMETHAZINE HCL 12.5 MG PO TABS: 25.0000 mg | ORAL_TABLET | Freq: Four times a day (QID) | ORAL | Status: DC | PRN

## 2012-02-07 NOTE — MAU Note (Signed)
Pt states having sharp stomach cramps, has had headache and nausea and vomiting. Vomited x3. Took zofran and it didn't help. Denies abnormal vaginal discharge and bleeding.

## 2012-02-07 NOTE — MAU Provider Note (Signed)
  History     CSN: [id number]  Arrival date and time: 02/07/12 1752   First Provider Initiated Contact with Patient 02/07/12 2152      Chief Complaint  Patient presents with  . Abdominal Pain   HPI  Crystal Burns is a 32 y.o. [id number] who presents today with nausea and vomiting. She has been taking zofran, and it has stopped working for the nausea/vomiting. Pt states she lost about 5# in the last week or two. She also has a headache.   Past Medical History  Diagnosis Date  . Hypertension   . Asthma   . Gestational diabetes     2012  . Depression     Past Surgical History  Procedure Date  . Dilation and curettage of uterus   . Cholecystectomy   . Wisdom tooth extraction     Family History  Problem Relation Age of Onset  . Hypertension Mother   . Diabetes Mother   . Cancer Mother   . Hypertension Father   . Diabetes Father   . Hyperlipidemia Father     History  Substance Use Topics  . Smoking status: Never Smoker   . Smokeless tobacco: Not on file  . Alcohol Use: No    Allergies:  Allergies  Allergen Reactions  . Shellfish Allergy Anaphylaxis    Prescriptions prior to admission  Medication Sig Dispense Refill  . acetaminophen (TYLENOL) 500 MG tablet Take 1,000 mg by mouth every 6 (six) hours as needed. Head ache      . albuterol (PROVENTIL HFA;VENTOLIN HFA) 108 (90 BASE) MCG/ACT inhaler Inhale 2 puffs into the lungs every 6 (six) hours as needed. asthma      . ondansetron (ZOFRAN-ODT) 8 MG disintegrating tablet Take 8 mg by mouth as needed. Used for nausea.      . Prenatal Vit-Fe Fumarate-FA (PRENATAL MULTIVITAMIN) TABS Take 1 tablet by mouth daily.      Marland Kitchen albuterol (PROVENTIL) (5 MG/ML) 0.5% nebulizer solution Take 1 mL (5 mg total) by nebulization once.  20 mL  1    Review of Systems  Constitutional: Negative for fever and chills.  Eyes: Negative for blurred vision.  Respiratory: Negative for cough.   Cardiovascular: Negative for chest pain and  orthopnea.  Gastrointestinal: Positive for nausea and vomiting. Negative for heartburn, abdominal pain, diarrhea and constipation.  Genitourinary: Negative for dysuria, urgency and frequency.   Physical Exam   Blood pressure 144/71, pulse 78, temperature 98.4 F (36.9 C), temperature source Oral, resp. rate 18, height 5\' 3"  (1.6 m), weight 114.533 kg (252 lb 8 oz), last menstrual period 09/07/2011, not currently breastfeeding.  Physical Exam  Nursing note and vitals reviewed. Constitutional: She is oriented to person, place, and time. She appears well-developed and well-nourished.  Cardiovascular: Normal rate and regular rhythm.   Respiratory: Effort normal and breath sounds normal.  GI: Soft. Bowel sounds are normal.  Neurological: She is alert and oriented to person, place, and time.  Skin: Skin is warm and dry.  Psychiatric: She has a normal mood and affect.    MAU Course  Procedures  2250: pt states that she has been able to keep down food after taking phenergan and is feeling better.   Assessment and Plan   1. Nausea and vomiting in pregnancy   phenergan 25 mg PO q 6 hours PRN FU with [facility] as needed or when scheduled.  [name] 02/07/2012, 9:53 PM

## 2012-02-07 NOTE — MAU Note (Signed)
Pt states she has had vomitting for entire pregnancy & Zofran has helped.  Pt states she is now vomitting every time she eats & the Zofran doesn't help, also has HA.

## 2012-04-07 LAB — OB RESULTS CONSOLE RPR: RPR: NONREACTIVE

## 2012-04-08 NOTE — L&D Delivery Note (Signed)
Delivery Note At 12:46 PM a viable female was delivered via Vaginal, Spontaneous Delivery.  APGAR: 8, 9; weight .   Placenta status: Intact, Spontaneous.  Cord: 3 vessels with the following complications: None.    Anesthesia: Local  Episiotomy: None Lacerations: first degree, peiurethral Suture Repair: 3.0 vicryl rapide Est. Blood Loss (mL): 100 ml  Mom to postpartum.  Baby to nursery-stable.  Burns,Crystal Yoshino A 07/18/2012, 1:16 PM

## 2012-05-10 ENCOUNTER — Encounter (HOSPITAL_COMMUNITY): Payer: Self-pay | Admitting: *Deleted

## 2012-05-10 ENCOUNTER — Inpatient Hospital Stay (HOSPITAL_COMMUNITY)
Admission: AD | Admit: 2012-05-10 | Discharge: 2012-05-10 | Disposition: A | Discharge status: Home / Self Care | Payer: Medicaid Other | Source: Ambulatory Visit | Attending: Obstetrics | Admitting: Obstetrics

## 2012-05-10 DIAGNOSIS — N949 Unspecified condition associated with female genital organs and menstrual cycle: Secondary | ICD-10-CM | POA: Insufficient documentation

## 2012-05-10 DIAGNOSIS — N898 Other specified noninflammatory disorders of vagina: Secondary | ICD-10-CM

## 2012-05-10 DIAGNOSIS — O99891 Other specified diseases and conditions complicating pregnancy: Secondary | ICD-10-CM | POA: Insufficient documentation

## 2012-05-10 DIAGNOSIS — O9989 Other specified diseases and conditions complicating pregnancy, childbirth and the puerperium: Secondary | ICD-10-CM

## 2012-05-10 DIAGNOSIS — O26899 Other specified pregnancy related conditions, unspecified trimester: Secondary | ICD-10-CM

## 2012-05-10 LAB — POCT FERN TEST: POCT Fern Test: NEGATIVE

## 2012-05-10 LAB — URINALYSIS, ROUTINE W REFLEX MICROSCOPIC
Bilirubin Urine: NEGATIVE
Leukocytes, UA: NEGATIVE
Nitrite: NEGATIVE
Specific Gravity, Urine: 1.02 (ref 1.005–1.030)
pH: 6 (ref 5.0–8.0)

## 2012-05-10 LAB — WET PREP, GENITAL: Yeast Wet Prep HPF POC: NONE SEEN

## 2012-05-10 NOTE — MAU Provider Note (Signed)
History     CSN: [id number]  Arrival date and time: 05/10/12 1528   First Provider Initiated Contact with Patient 05/10/12 1634      Chief Complaint  Patient presents with  . Vaginal Discharge   HPI 32 y.o. [id number] at [redacted]w[redacted]d with vaginal discharge noted upon wakening. Brownish/pink discharge, watery. No contractions.    Past Medical History  Diagnosis Date  . Hypertension   . Asthma   . Gestational diabetes     2012  . Depression     Past Surgical History  Procedure Date  . Dilation and curettage of uterus   . Cholecystectomy   . Wisdom tooth extraction     Family History  Problem Relation Age of Onset  . Hypertension Mother   . Diabetes Mother   . Cancer Mother   . Hypertension Father   . Diabetes Father   . Hyperlipidemia Father     History  Substance Use Topics  . Smoking status: Never Smoker   . Smokeless tobacco: Not on file  . Alcohol Use: No    Allergies:  Allergies  Allergen Reactions  . Shellfish Allergy Anaphylaxis    No prescriptions prior to admission    Review of Systems  Constitutional: Negative.   Respiratory: Negative.   Cardiovascular: Negative.   Gastrointestinal: Negative for nausea, vomiting, abdominal pain, diarrhea and constipation.  Genitourinary: Negative for dysuria, urgency, frequency, hematuria and flank pain.       Negative for vaginal bleeding, cramping/contractions  Musculoskeletal: Negative.   Neurological: Negative.   Psychiatric/Behavioral: Negative.    Physical Exam   Blood pressure 124/69, pulse 85, temperature 98 F (36.7 C), temperature source Oral, resp. rate 16, height 5\' 3"  (1.6 m), last menstrual period 09/07/2011.  Physical Exam  Nursing note and vitals reviewed. Constitutional: She is oriented to person, place, and time. She appears well-developed and well-nourished. No distress.  Cardiovascular: Normal rate.   Respiratory: Effort normal.  GI: Soft. There is no tenderness.  Genitourinary: No  bleeding around the vagina. Vaginal discharge (white) found.       Dilation: Closed Effacement (%): Thick Cervical Position: Posterior Exam by:: Georges Mouse CNM   Musculoskeletal: Normal range of motion.  Neurological: She is alert and oriented to person, place, and time.  Skin: Skin is warm.  Psychiatric: She has a normal mood and affect.    MAU Course  Procedures  Results for orders placed during the hospital encounter of 05/10/12 (from the past 24 hour(s))  WET PREP, GENITAL     Status: Abnormal   Collection Time   05/10/12  4:23 PM      Component Value Range   Yeast Wet Prep HPF POC NONE SEEN  NONE SEEN   Trich, Wet Prep NONE SEEN  NONE SEEN   Clue Cells Wet Prep HPF POC NONE SEEN  NONE SEEN   WBC, Wet Prep HPF POC FEW (*) NONE SEEN  URINALYSIS, ROUTINE W REFLEX MICROSCOPIC     Status: Normal   Collection Time   05/10/12  4:38 PM      Component Value Range   Color, Urine YELLOW  YELLOW   APPearance CLEAR  CLEAR   Specific Gravity, Urine 1.020  1.005 - 1.030   pH 6.0  5.0 - 8.0   Glucose, UA NEGATIVE  NEGATIVE mg/dL   Hgb urine dipstick NEGATIVE  NEGATIVE   Bilirubin Urine NEGATIVE  NEGATIVE   Ketones, ur NEGATIVE  NEGATIVE mg/dL   Protein, ur NEGATIVE  NEGATIVE  mg/dL   Urobilinogen, UA 1.0  0.0 - 1.0 mg/dL   Nitrite NEGATIVE  NEGATIVE   Leukocytes, UA NEGATIVE  NEGATIVE  POCT FERN TEST     Status: Normal   Collection Time   05/10/12  5:22 PM      Component Value Range   POCT Fern Test Negative = intact amniotic membranes     EFM reactive at 29 weeks, TOCO quiet   Assessment and Plan   1. Vaginal discharge in pregnancy       Medication List     As of 05/10/2012  5:43 PM    CONTINUE taking these medications         acetaminophen 500 MG tablet   Commonly known as: TYLENOL      * albuterol 108 (90 BASE) MCG/ACT inhaler   Commonly known as: PROVENTIL HFA;VENTOLIN HFA      * albuterol (5 MG/ML) 0.5% nebulizer solution   Commonly known as: PROVENTIL       ondansetron 8 MG disintegrating tablet   Commonly known as: ZOFRAN-ODT     * Notice: This list has 2 medication(s) that are the same as other medications prescribed for you. Read the directions carefully, and ask your doctor or other care provider to review them with you.          Follow-up Information    Follow up with Roseanna Rainbow, MD. (as scheduled)    Contact information:   [address], [address] [place] [address] [phone]            Georges Mouse 05/10/2012, 5:40 PM

## 2012-05-10 NOTE — MAU Note (Signed)
Crystal Burns is here due to increased vaginal discharge. She called the nurse line and they told her this could be her membranes that ruptured. She is [redacted]w[redacted]d. The discharge is white with brownish, pink liquid discharge as well.

## 2012-05-11 LAB — GC/CHLAMYDIA PROBE AMP: GC Probe RNA: NEGATIVE

## 2012-05-18 ENCOUNTER — Ambulatory Visit: Payer: Medicaid Other | Attending: Obstetrics & Gynecology | Admitting: Physical Therapy

## 2012-05-25 ENCOUNTER — Ambulatory Visit: Payer: Medicaid Other | Admitting: Physical Therapy

## 2012-06-02 ENCOUNTER — Ambulatory Visit: Payer: Medicaid Other | Admitting: *Deleted

## 2012-06-20 LAB — OB RESULTS CONSOLE GBS: GBS: NEGATIVE

## 2012-06-24 ENCOUNTER — Encounter: Payer: Self-pay | Admitting: Obstetrics

## 2012-06-27 ENCOUNTER — Encounter: Payer: Self-pay | Admitting: *Deleted

## 2012-06-30 ENCOUNTER — Encounter: Payer: Medicaid Other | Admitting: Obstetrics & Gynecology

## 2012-06-30 ENCOUNTER — Encounter: Payer: Self-pay | Admitting: Obstetrics & Gynecology

## 2012-06-30 ENCOUNTER — Other Ambulatory Visit: Payer: Medicaid Other

## 2012-06-30 DIAGNOSIS — IMO0002 Reserved for concepts with insufficient information to code with codable children: Secondary | ICD-10-CM

## 2012-06-30 DIAGNOSIS — O0990 Supervision of high risk pregnancy, unspecified, unspecified trimester: Secondary | ICD-10-CM

## 2012-07-06 ENCOUNTER — Ambulatory Visit (INDEPENDENT_AMBULATORY_CARE_PROVIDER_SITE_OTHER): Payer: Medicaid Other | Admitting: Obstetrics & Gynecology

## 2012-07-06 ENCOUNTER — Inpatient Hospital Stay (HOSPITAL_COMMUNITY)
Admission: AD | Admit: 2012-07-06 | Discharge: 2012-07-06 | Disposition: A | Discharge status: Home / Self Care | Payer: Medicaid Other | Source: Ambulatory Visit | Attending: Obstetrics & Gynecology | Admitting: Obstetrics & Gynecology

## 2012-07-06 ENCOUNTER — Encounter (HOSPITAL_COMMUNITY): Payer: Self-pay | Admitting: *Deleted

## 2012-07-06 VITALS — BP 159/96 | Temp 97.2°F | Wt 269.0 lb

## 2012-07-06 DIAGNOSIS — O99891 Other specified diseases and conditions complicating pregnancy: Secondary | ICD-10-CM | POA: Insufficient documentation

## 2012-07-06 DIAGNOSIS — E669 Obesity, unspecified: Secondary | ICD-10-CM

## 2012-07-06 DIAGNOSIS — O0990 Supervision of high risk pregnancy, unspecified, unspecified trimester: Secondary | ICD-10-CM

## 2012-07-06 DIAGNOSIS — R030 Elevated blood-pressure reading, without diagnosis of hypertension: Secondary | ICD-10-CM | POA: Insufficient documentation

## 2012-07-06 LAB — COMPREHENSIVE METABOLIC PANEL
ALT: 8 U/L (ref 0–35)
Alkaline Phosphatase: 129 U/L — ABNORMAL HIGH (ref 39–117)
CO2: 26 mEq/L (ref 19–32)
Chloride: 102 mEq/L (ref 96–112)
GFR calc Af Amer: >90 mL/min (ref >90)
GFR calc non Af Amer: >90 mL/min (ref >90)
Glucose, Bld: 71 mg/dL (ref 70–99)
Potassium: 3.9 mEq/L (ref 3.5–5.1)
Sodium: 134 mEq/L — ABNORMAL LOW (ref 135–145)

## 2012-07-06 LAB — PROTEIN / CREATININE RATIO, URINE: Protein Creatinine Ratio: 0.09 (ref 0.00–0.15)

## 2012-07-06 LAB — POCT URINALYSIS DIPSTICK
Bilirubin, UA: NEGATIVE
Blood, UA: NEGATIVE
Nitrite, UA: NEGATIVE
pH, UA: 7

## 2012-07-06 LAB — CBC
Hemoglobin: 10.1 g/dL — ABNORMAL LOW (ref 12.0–15.0)
RBC: 4.08 MIL/uL (ref 3.87–5.11)

## 2012-07-06 LAB — URINALYSIS, ROUTINE W REFLEX MICROSCOPIC
Bilirubin Urine: NEGATIVE
Leukocytes, UA: NEGATIVE
Nitrite: NEGATIVE
Specific Gravity, Urine: 1.02 (ref 1.005–1.030)
pH: 8 (ref 5.0–8.0)

## 2012-07-06 MED ORDER — ACETAMINOPHEN 500 MG PO TABS
1000.0000 mg | ORAL_TABLET | Freq: Once | ORAL | Status: AC
  Administered 2012-07-06: 1000 mg via ORAL
  Filled 2012-07-06: qty 2

## 2012-07-06 NOTE — MAU Note (Signed)
Pt sent from MD office for PIH eval.  BP in office 148/100.  Pt C/O HA, nausea, occasional uc's, vaginal pressure.  Denies any LOF or bleeding.

## 2012-07-06 NOTE — MAU Provider Note (Signed)
History     CSN: [id number]  Arrival date and time: 07/06/12 1312   First Provider Initiated Contact with Patient 07/06/12 1456      Chief Complaint  Patient presents with  . Hypertension   HPI [name] is a 32 y.o. [id number] at [redacted]w[redacted]d who was sent over from the office for evaluation of GHTN. The patient states BP was 159/100 in the office. She has been having headache and lower abdominal discomfort. She states that she has had a lot of pressure down low. She states that she has had a headache x3 days and that was why she went to the office this morning, but she was not seen as the MD was called away for an emergency.   OB History   Grav Para Term Preterm Abortions TAB SAB Ect Mult Living   9 5 5  3  3   4       Past Medical History  Diagnosis Date  . Hypertension   . Asthma   . Gestational diabetes     2012  . Depression   . History of gonorrhea     Past Surgical History  Procedure Laterality Date  . Dilation and curettage of uterus    . Cholecystectomy    . Wisdom tooth extraction      Family History  Problem Relation Age of Onset  . Hypertension Mother   . Diabetes Mother   . Cancer Mother   . Hypertension Father   . Diabetes Father   . Hyperlipidemia Father   . Arthritis    . Asthma    . Breast cancer    . Heart failure    . Congenital heart disease    . Depression    . Heart attack      History  Substance Use Topics  . Smoking status: Never Smoker   . Smokeless tobacco: Not on file  . Alcohol Use: No    Allergies:  Allergies  Allergen Reactions  . Shellfish Allergy Anaphylaxis    No prescriptions prior to admission    Review of Systems  Constitutional: Negative for fever.  Gastrointestinal: Positive for abdominal pain. Negative for nausea and vomiting.  Genitourinary:       Neg - vaginal bleeding Neg - abnormal discharge, LOF  Neurological: Positive for headaches.   Physical Exam   Blood pressure 139/81, pulse 93,  temperature 98 F (36.7 C), temperature source Oral, resp. rate 20, height 5\' 3"  (1.6 m), weight 268 lb 6.4 oz (121.745 kg), last menstrual period 09/07/2011.  Physical Exam  Constitutional: She is oriented to person, place, and time. She appears well-developed and well-nourished. No distress.  HENT:  Head: Normocephalic and atraumatic.  Cardiovascular: Normal rate, regular rhythm and normal heart sounds.   Respiratory: Effort normal and breath sounds normal. No respiratory distress.  GI: Soft. Bowel sounds are normal. She exhibits no distension and no mass. There is tenderness (moderate suprapubic tenderness to palpation). There is no rebound and no guarding.  Neurological: She is alert and oriented to person, place, and time.  Skin: Skin is warm and dry. No erythema.  Psychiatric: She has a normal mood and affect.  Dilation: Fingertip Station: Ballotable Presentation: Vertex  Results for orders placed during the hospital encounter of 07/06/12 (from the past 24 hour(s))  PROTEIN / CREATININE RATIO, URINE     Status: None   Collection Time    07/06/12  1:35 PM      Result Value Range  Creatinine, Urine 57.29     Total Protein, Urine 5.3     PROTEIN CREATININE RATIO 0.09  0.00 - 0.15  URINALYSIS, ROUTINE W REFLEX MICROSCOPIC     Status: None   Collection Time    07/06/12  1:38 PM      Result Value Range   Color, Urine YELLOW  YELLOW   APPearance CLEAR  CLEAR   Specific Gravity, Urine 1.020  1.005 - 1.030   pH 8.0  5.0 - 8.0   Glucose, UA NEGATIVE  NEGATIVE mg/dL   Hgb urine dipstick NEGATIVE  NEGATIVE   Bilirubin Urine NEGATIVE  NEGATIVE   Ketones, ur NEGATIVE  NEGATIVE mg/dL   Protein, ur NEGATIVE  NEGATIVE mg/dL   Urobilinogen, UA 1.0  0.0 - 1.0 mg/dL   Nitrite NEGATIVE  NEGATIVE   Leukocytes, UA NEGATIVE  NEGATIVE  CBC     Status: Abnormal   Collection Time    07/06/12  1:40 PM      Result Value Range   WBC 7.7  4.0 - 10.5 K/uL   RBC 4.08  3.87 - 5.11 MIL/uL    Hemoglobin 10.1 (*) 12.0 - 15.0 g/dL   HCT 11.9 (*) 14.7 - 82.9 %   MCV 77.2 (*) 78.0 - 100.0 fL   MCH 24.8 (*) 26.0 - 34.0 pg   MCHC 32.1  30.0 - 36.0 g/dL   RDW 56.2  13.0 - 86.5 %   Platelets 208  150 - 400 K/uL  COMPREHENSIVE METABOLIC PANEL     Status: Abnormal   Collection Time    07/06/12  1:40 PM      Result Value Range   Sodium 134 (*) 135 - 145 mEq/L   Potassium 3.9  3.5 - 5.1 mEq/L   Chloride 102  96 - 112 mEq/L   CO2 26  19 - 32 mEq/L   Glucose, Bld 71  70 - 99 mg/dL   BUN 3 (*) 6 - 23 mg/dL   Creatinine, Ser 7.84 (*) 0.50 - 1.10 mg/dL   Calcium 9.5  8.4 - 69.6 mg/dL   Total Protein 6.6  6.0 - 8.3 g/dL   Albumin 2.6 (*) 3.5 - 5.2 g/dL   AST 8  0 - 37 U/L   ALT 8  0 - 35 U/L   Alkaline Phosphatase 129 (*) 39 - 117 U/L   Total Bilirubin 0.3  0.3 - 1.2 mg/dL   GFR calc non Af Amer >90  >90 mL/min   GFR calc Af Amer >90  >90 mL/min  LACTATE DEHYDROGENASE     Status: None   Collection Time    07/06/12  1:40 PM      Result Value Range   LDH 116  94 - 250 U/L   Fetal Monitoring: Baseline: 125 bpm, moderate variability, + accelerations, no decelerations Contractions: none  MAU Course  Procedures None  MDM Discussed patient with Dr. Tamela Oddi. She states that patient should be re-evaluated 1 hour after tylenol is given. If headache is improved she may be discharged, otherwise call back to Dr. Tamela Oddi for further instruction.  Patient given Tylenol. Patient refuses to wait for re-evaluation. Patient left AMA. Has follow-up scheduled in the office for Wednesday.    Assessment and Plan  A: Elevated blood pressure in pregnancy  P: Patient left AMA prior to re-evaluation for headache management Patient encouraged previously to follow-up as scheduled in the office or return to MAU with any worsening of her condition  Crystal Alexandria  Ethier, PA-C  07/06/2012, 5:40 PM

## 2012-07-06 NOTE — Progress Notes (Signed)
Pulse-88 Pt c/o headache x 3 days, lightheaded, dizzy, nausea, vomiting, vaginal pain with pressure, and constipation.

## 2012-07-08 ENCOUNTER — Ambulatory Visit (INDEPENDENT_AMBULATORY_CARE_PROVIDER_SITE_OTHER): Payer: Medicaid Other | Admitting: Obstetrics & Gynecology

## 2012-07-08 ENCOUNTER — Ambulatory Visit (INDEPENDENT_AMBULATORY_CARE_PROVIDER_SITE_OTHER): Payer: Medicaid Other

## 2012-07-08 ENCOUNTER — Encounter: Payer: Self-pay | Admitting: Obstetrics & Gynecology

## 2012-07-08 ENCOUNTER — Other Ambulatory Visit: Payer: Self-pay | Admitting: Obstetrics & Gynecology

## 2012-07-08 VITALS — BP 133/93 | Temp 97.2°F | Wt 267.0 lb

## 2012-07-08 DIAGNOSIS — O3660X0 Maternal care for excessive fetal growth, unspecified trimester, not applicable or unspecified: Secondary | ICD-10-CM

## 2012-07-08 DIAGNOSIS — O3663X1 Maternal care for excessive fetal growth, third trimester, fetus 1: Secondary | ICD-10-CM

## 2012-07-08 DIAGNOSIS — O99810 Abnormal glucose complicating pregnancy: Secondary | ICD-10-CM

## 2012-07-08 DIAGNOSIS — O0990 Supervision of high risk pregnancy, unspecified, unspecified trimester: Secondary | ICD-10-CM | POA: Insufficient documentation

## 2012-07-08 LAB — US OB DETAIL + 14 WK

## 2012-07-08 LAB — POCT URINALYSIS DIPSTICK
Blood, UA: NEGATIVE
Glucose, UA: NEGATIVE
Nitrite, UA: NEGATIVE
Spec Grav, UA: 1.015

## 2012-07-08 MED ORDER — ZOLPIDEM TARTRATE 5 MG PO TABS: 5.0000 mg | ORAL_TABLET | Freq: Every evening | ORAL | Status: DC | PRN

## 2012-07-08 NOTE — Progress Notes (Signed)
NST OK. 

## 2012-07-08 NOTE — Progress Notes (Signed)
Pulse-102 Peak flow-400

## 2012-07-08 NOTE — Patient Instructions (Signed)
Labor Induction  Most women go into labor on their own between 37 and 42 weeks of the pregnancy. When this does not happen or when there is a medical need, medicine or other methods may be used to induce labor. Labor induction causes a pregnant woman's uterus to contract. It also causes the cervix to soften (ripen), open (dilate), and thin out (efface). Usually, labor is not induced before 39 weeks of the pregnancy unless there is a problem with the baby or mother. Whether your labor will be induced depends on a number of factors, including the following:  The medical condition of you and the baby.  How many weeks along you are.  The status of baby's lung maturity.  The condition of the cervix.  The position of the baby. REASONS FOR LABOR INDUCTION  The health of the baby or mother is at risk.  The pregnancy is overdue by 1 week or more.  The water breaks but labor does not start on its own.  The mother has a health condition or serious illness such as high blood pressure, infection, placental abruption, or diabetes.  The amniotic fluid amounts are low around the baby.  The baby is distressed. REASONS TO NOT INDUCE LABOR Labor induction may not be a good idea if:  It is shown that your baby does not tolerate labor.  An induction is just more convenient.  You want the baby to be born on a certain date, like a holiday.  You have had previous surgeries on your uterus, such as a myomectomy or the removal of fibroids.  Your placenta lies very low in the uterus and blocks the opening of the cervix (placenta previa).  Your baby is not in a head down position.  The umbilical cord drops down into the birth canal in front of the baby. This could cut off the baby's blood and oxygen supply.  You have had a previous cesarean delivery.  There areunusual circumstances, such as the baby being extremely premature. RISKS AND COMPLICATIONS Problems may occur in the process of induction  and plans may need to be modified as a situation unfolds. Some of the risks of induction include:  Change in fetal heart rate, such as too high, too low, or erratic.  Risk of fetal distress.  Risk of infection to mother and baby.  Increased chance of having a cesarean delivery.  The rare, but increased chance that the placenta will separate from the uterus (abruption).  Uterine rupture (very rare). When induction is needed for medical reasons, the benefits of induction may outweigh the risks. BEFORE THE PROCEDURE Your caregiver will check your cervix and the baby's position. This will help your caregiver decide if you are far enough along for an induction to work. PROCEDURE Several methods of labor induction may be used, such as:   Taking prostaglandin medicine to dilate and ripen the cervix. The medicine will also start contractions. It can be taken by mouth or by inserting a suppository into the vagina.  A thin tube (catheter) with a balloon on the end may be inserted into your vagina to dilate the cervix. Once inserted, the balloon expands with water, which causes the cervix to open.  Striping the membranes. Your caregiver inserts a finger between the cervix and membranes, which causes the cervix to be stretched and may cause the uterus to contract. This is often done during an office visit. You will be sent home to wait for the contractions to begin. You will   then come in for an induction.  Breaking the water. Your caregiver will make a hole in the amniotic sac using a small instrument. Once the amniotic sac breaks, contractions should begin. This may still take hours to see an effect.  Taking medicine to trigger or strengthen contractions. This medicine is given intravenously through a tube in your arm. All of the methods of induction, besides stripping the membranes, will be done in the hospital. Induction is done in the hospital so that you and the baby can be carefully  monitored. AFTER THE PROCEDURE Some inductions can take up to 2 or 3 days. Depending on the cervix, it usually takes less time. It takes longer when you are induced early in the pregnancy or if this is your first pregnancy. If a mother is still pregnant and the induction has been going on for 2 to 3 days, either the mother will be sent home or a cesarean delivery will be needed. Document Released: 08/14/2006 Document Revised: 06/17/2011 Document Reviewed: 01/28/2011 [employer] Patient Information 2013 [employer].  

## 2012-07-13 ENCOUNTER — Encounter: Payer: Self-pay | Admitting: Obstetrics & Gynecology

## 2012-07-14 ENCOUNTER — Telehealth: Payer: Self-pay | Admitting: *Deleted

## 2012-07-14 NOTE — Telephone Encounter (Signed)
Spoke with patient- she is miserable. Pt wants to be induced- she is having problems walking and she is so uncomfortable. Encouraged to keep appointment on Thursday increase fluids, take her Tylenol and get rest. Will call to check her tomorrow.

## 2012-07-14 NOTE — Telephone Encounter (Signed)
Pt states she is has a headache and her legs are hurting. Pt states her children have a stomach virus. Pt has taken tylenol and is resting Pt want to know what she should do. Pt encourage to increase fluids and to make sure she is eating. Pt encourage to make sure everyone is washing their hands to keep from spreading the stomach virus. Pt states she is wanting to be induced. Pt has a follow up appointment on Thursday with Dr. Tamela Oddi. Pt informed she need to discuss it at that appointment. Per August Saucer, RN

## 2012-07-16 ENCOUNTER — Encounter: Payer: Self-pay | Admitting: Obstetrics & Gynecology

## 2012-07-16 ENCOUNTER — Other Ambulatory Visit: Payer: Medicaid Other

## 2012-07-16 ENCOUNTER — Ambulatory Visit (INDEPENDENT_AMBULATORY_CARE_PROVIDER_SITE_OTHER): Payer: Medicaid Other | Admitting: Obstetrics & Gynecology

## 2012-07-16 DIAGNOSIS — O0990 Supervision of high risk pregnancy, unspecified, unspecified trimester: Secondary | ICD-10-CM

## 2012-07-16 NOTE — Progress Notes (Signed)
NST OK.  IOL tomorrow.

## 2012-07-16 NOTE — Patient Instructions (Signed)
Labor Induction  Most women go into labor on their own between 37 and 42 weeks of the pregnancy. When this does not happen or when there is a medical need, medicine or other methods may be used to induce labor. Labor induction causes a pregnant woman's uterus to contract. It also causes the cervix to soften (ripen), open (dilate), and thin out (efface). Usually, labor is not induced before 39 weeks of the pregnancy unless there is a problem with the baby or mother. Whether your labor will be induced depends on a number of factors, including the following:  The medical condition of you and the baby.  How many weeks along you are.  The status of baby's lung maturity.  The condition of the cervix.  The position of the baby. REASONS FOR LABOR INDUCTION  The health of the baby or mother is at risk.  The pregnancy is overdue by 1 week or more.  The water breaks but labor does not start on its own.  The mother has a health condition or serious illness such as high blood pressure, infection, placental abruption, or diabetes.  The amniotic fluid amounts are low around the baby.  The baby is distressed. REASONS TO NOT INDUCE LABOR Labor induction may not be a good idea if:  It is shown that your baby does not tolerate labor.  An induction is just more convenient.  You want the baby to be born on a certain date, like a holiday.  You have had previous surgeries on your uterus, such as a myomectomy or the removal of fibroids.  Your placenta lies very low in the uterus and blocks the opening of the cervix (placenta previa).  Your baby is not in a head down position.  The umbilical cord drops down into the birth canal in front of the baby. This could cut off the baby's blood and oxygen supply.  You have had a previous cesarean delivery.  There areunusual circumstances, such as the baby being extremely premature. RISKS AND COMPLICATIONS Problems may occur in the process of induction  and plans may need to be modified as a situation unfolds. Some of the risks of induction include:  Change in fetal heart rate, such as too high, too low, or erratic.  Risk of fetal distress.  Risk of infection to mother and baby.  Increased chance of having a cesarean delivery.  The rare, but increased chance that the placenta will separate from the uterus (abruption).  Uterine rupture (very rare). When induction is needed for medical reasons, the benefits of induction may outweigh the risks. BEFORE THE PROCEDURE Your caregiver will check your cervix and the baby's position. This will help your caregiver decide if you are far enough along for an induction to work. PROCEDURE Several methods of labor induction may be used, such as:   Taking prostaglandin medicine to dilate and ripen the cervix. The medicine will also start contractions. It can be taken by mouth or by inserting a suppository into the vagina.  A thin tube (catheter) with a balloon on the end may be inserted into your vagina to dilate the cervix. Once inserted, the balloon expands with water, which causes the cervix to open.  Striping the membranes. Your caregiver inserts a finger between the cervix and membranes, which causes the cervix to be stretched and may cause the uterus to contract. This is often done during an office visit. You will be sent home to wait for the contractions to begin. You will   then come in for an induction.  Breaking the water. Your caregiver will make a hole in the amniotic sac using a small instrument. Once the amniotic sac breaks, contractions should begin. This may still take hours to see an effect.  Taking medicine to trigger or strengthen contractions. This medicine is given intravenously through a tube in your arm. All of the methods of induction, besides stripping the membranes, will be done in the hospital. Induction is done in the hospital so that you and the baby can be carefully  monitored. AFTER THE PROCEDURE Some inductions can take up to 2 or 3 days. Depending on the cervix, it usually takes less time. It takes longer when you are induced early in the pregnancy or if this is your first pregnancy. If a mother is still pregnant and the induction has been going on for 2 to 3 days, either the mother will be sent home or a cesarean delivery will be needed. Document Released: 08/14/2006 Document Revised: 06/17/2011 Document Reviewed: 01/28/2011 [employer] Patient Information 2013 [employer].  

## 2012-07-17 ENCOUNTER — Inpatient Hospital Stay (HOSPITAL_COMMUNITY)
Admission: RE | Admit: 2012-07-17 | Discharge: 2012-07-19 | DRG: 774 | Disposition: A | Discharge status: Home / Self Care | Payer: Medicaid Other | Source: Ambulatory Visit | Attending: Obstetrics & Gynecology | Admitting: Obstetrics & Gynecology

## 2012-07-17 ENCOUNTER — Other Ambulatory Visit: Payer: Self-pay | Admitting: *Deleted

## 2012-07-17 ENCOUNTER — Encounter (HOSPITAL_COMMUNITY): Payer: Self-pay

## 2012-07-17 VITALS — BP 137/83 | HR 87 | Temp 98.5°F | Resp 20 | Ht 63.0 in | Wt 269.0 lb

## 2012-07-17 DIAGNOSIS — O1002 Pre-existing essential hypertension complicating childbirth: Principal | ICD-10-CM | POA: Diagnosis present

## 2012-07-17 DIAGNOSIS — J45901 Unspecified asthma with (acute) exacerbation: Secondary | ICD-10-CM

## 2012-07-17 DIAGNOSIS — I10 Essential (primary) hypertension: Secondary | ICD-10-CM | POA: Diagnosis present

## 2012-07-17 LAB — CBC
HCT: 30.6 % — ABNORMAL LOW (ref 36.0–46.0)
MCHC: 31.7 g/dL (ref 30.0–36.0)
Platelets: 225 10*3/uL (ref 150–400)
RDW: 14.7 % (ref 11.5–15.5)
WBC: 7.7 10*3/uL (ref 4.0–10.5)

## 2012-07-17 LAB — TYPE AND SCREEN
ABO/RH(D): O POS
Antibody Screen: NEGATIVE

## 2012-07-17 MED ORDER — LIDOCAINE HCL (PF) 1 % IJ SOLN
30.0000 mL | INTRAMUSCULAR | Status: DC | PRN
  Administered 2012-07-18: 30 mL via SUBCUTANEOUS
  Filled 2012-07-17 (×2): qty 30

## 2012-07-17 MED ORDER — MISOPROSTOL 25 MCG QUARTER TABLET
25.0000 ug | ORAL_TABLET | ORAL | Status: DC | PRN
  Administered 2012-07-17 – 2012-07-18 (×3): 25 ug via VAGINAL
  Filled 2012-07-17 (×2): qty 0.25
  Filled 2012-07-17: qty 1
  Filled 2012-07-17 (×2): qty 0.25

## 2012-07-17 MED ORDER — ONDANSETRON HCL 4 MG/2ML IJ SOLN
4.0000 mg | Freq: Four times a day (QID) | INTRAMUSCULAR | Status: DC | PRN
  Administered 2012-07-17: 4 mg via INTRAVENOUS
  Filled 2012-07-17: qty 2

## 2012-07-17 MED ORDER — BUTORPHANOL TARTRATE 1 MG/ML IJ SOLN
1.0000 mg | INTRAMUSCULAR | Status: DC | PRN
  Administered 2012-07-18 (×5): 1 mg via INTRAVENOUS
  Filled 2012-07-17 (×5): qty 1

## 2012-07-17 MED ORDER — ACETAMINOPHEN 325 MG PO TABS
650.0000 mg | ORAL_TABLET | ORAL | Status: DC | PRN
  Administered 2012-07-18: 650 mg via ORAL
  Filled 2012-07-17: qty 2

## 2012-07-17 MED ORDER — IBUPROFEN 600 MG PO TABS
600.0000 mg | ORAL_TABLET | Freq: Four times a day (QID) | ORAL | Status: DC | PRN
  Administered 2012-07-18: 600 mg via ORAL
  Filled 2012-07-17: qty 1

## 2012-07-17 MED ORDER — LACTATED RINGERS IV SOLN
INTRAVENOUS | Status: DC
  Administered 2012-07-17 – 2012-07-18 (×3): via INTRAVENOUS

## 2012-07-17 MED ORDER — LACTATED RINGERS IV SOLN: 500.0000 mL | INTRAVENOUS | Status: DC | PRN

## 2012-07-17 MED ORDER — OXYTOCIN BOLUS FROM INFUSION
500.0000 mL | INTRAVENOUS | Status: DC
  Administered 2012-07-18: 500 mL via INTRAVENOUS

## 2012-07-17 MED ORDER — HYDROXYZINE HCL 50 MG PO TABS
50.0000 mg | ORAL_TABLET | Freq: Four times a day (QID) | ORAL | Status: DC | PRN
  Administered 2012-07-18: 50 mg via ORAL
  Filled 2012-07-17 (×2): qty 1

## 2012-07-17 MED ORDER — TERBUTALINE SULFATE 1 MG/ML IJ SOLN: 0.2500 mg | Freq: Once | INTRAMUSCULAR | Status: AC | PRN

## 2012-07-17 MED ORDER — CITRIC ACID-SODIUM CITRATE 334-500 MG/5ML PO SOLN: 30.0000 mL | ORAL | Status: DC | PRN

## 2012-07-17 MED ORDER — OXYCODONE-ACETAMINOPHEN 5-325 MG PO TABS: 1.0000 | ORAL_TABLET | ORAL | Status: DC | PRN

## 2012-07-17 MED ORDER — OXYTOCIN 40 UNITS IN LACTATED RINGERS INFUSION - SIMPLE MED
62.5000 mL/h | INTRAVENOUS | Status: DC
  Filled 2012-07-17: qty 1000

## 2012-07-17 NOTE — H&P (Signed)
Crystal Burns is a 32 y.o. female presenting for IOL. Maternal Medical History:  Reason for admission: The pt has multiple musculoskeletal complaints.  H/O chronic HTN-- no medications required for B/P management this pregnancy.  Contractions: Onset was more than 2 days ago.   Frequency: irregular.    Fetal activity: Perceived fetal activity is normal.    Prenatal complications: PIH.   Prenatal Complications - Diabetes: none.    OB History   Grav Para Term Preterm Abortions TAB SAB Ect Mult Living   9 5 5  3  3   4      Past Medical History  Diagnosis Date  . Hypertension   . Asthma   . Gestational diabetes     2012  . Depression   . History of gonorrhea    Past Surgical History  Procedure Laterality Date  . Dilation and curettage of uterus    . Cholecystectomy    . Wisdom tooth extraction     Family History: family history includes Arthritis in an unspecified family member; Asthma in an unspecified family member; Breast cancer in an unspecified family member; Cancer in her mother; Congenital heart disease in an unspecified family member; Depression in an unspecified family member; Diabetes in her father and mother; Heart attack in an unspecified family member; Heart failure in an unspecified family member; Hyperlipidemia in her father; and Hypertension in her father and mother. Social History:  reports that she quit smoking about 20 months ago. Her smoking use included Cigarettes. She smoked 0.00 packs per day. She has never used smokeless tobacco. She reports that she does not drink alcohol or use illicit drugs.   Prenatal Transfer Tool  Maternal Diabetes: No Genetic Screening: Normal Maternal Ultrasounds/Referrals: Normal Fetal Ultrasounds or other Referrals:  None Maternal Substance Abuse:  No Significant Maternal Medications:  None Significant Maternal Lab Results:  None Other Comments:  None  Review of Systems  Constitutional: Negative for fever.  Eyes:  Negative for blurred vision.  Respiratory: Negative for shortness of breath.   Gastrointestinal: Negative for vomiting.  Musculoskeletal: Positive for joint pain.  Skin: Negative for rash.  Neurological: Negative for headaches.      Last menstrual period 09/07/2011. Maternal Exam:  Uterine Assessment: Contraction frequency is irregular.   Abdomen: Fetal presentation: vertex  Introitus: not evaluated.   Pelvis: adequate for delivery.   Cervix: Cervix evaluated by digital exam.     Fetal Exam Fetal Monitor Review: Variability: moderate (6-25 bpm).   Pattern: accelerations present and no decelerations.    Fetal State Assessment: Category I - tracings are normal.     Physical Exam  Constitutional: She appears well-developed.  HENT:  Head: Normocephalic.  Neck: Neck supple. No thyromegaly present.  Cardiovascular: Normal rate and regular rhythm.   Respiratory: Breath sounds normal.  GI: Soft. Bowel sounds are normal.  Skin: No rash noted.    Prenatal labs: ABO, Rh: O/Positive/-- (08/29 0000) Antibody: Negative (08/29 0000) Rubella: Immune (08/29 0000) RPR: Nonreactive (12/31 0000)  HBsAg: Negative (08/29 0000)  HIV: Non-reactive (12/31 0000)  GBS: Negative (03/15 0000)   Assessment/Plan: Multipara @ [redacted]w[redacted]d w/ multiple musculoskeletal complaints, unfavorable Bishop's score. Multiple false labor starts.  For IOL at maternal request  Admit  Two-stage IOL  JACKSON-MOORE,Mellie Buccellato A 07/17/2012, 7:51 PM

## 2012-07-18 ENCOUNTER — Encounter (HOSPITAL_COMMUNITY): Payer: Self-pay

## 2012-07-18 DIAGNOSIS — I10 Essential (primary) hypertension: Secondary | ICD-10-CM | POA: Diagnosis present

## 2012-07-18 LAB — COMPREHENSIVE METABOLIC PANEL
BUN: 4 mg/dL — ABNORMAL LOW (ref 6–23)
CO2: 24 mEq/L (ref 19–32)
Calcium: 9.2 mg/dL (ref 8.4–10.5)
Creatinine, Ser: 0.53 mg/dL (ref 0.50–1.10)
GFR calc Af Amer: >90 mL/min (ref >90)
GFR calc non Af Amer: >90 mL/min (ref >90)
Glucose, Bld: 98 mg/dL (ref 70–99)
Sodium: 137 mEq/L (ref 135–145)
Total Protein: 6.4 g/dL (ref 6.0–8.3)

## 2012-07-18 LAB — RPR: RPR Ser Ql: NONREACTIVE

## 2012-07-18 MED ORDER — OXYCODONE-ACETAMINOPHEN 5-325 MG PO TABS
1.0000 | ORAL_TABLET | ORAL | Status: DC | PRN
  Administered 2012-07-18 – 2012-07-19 (×2): 1 via ORAL
  Filled 2012-07-18 (×3): qty 1

## 2012-07-18 MED ORDER — PRENATAL MULTIVITAMIN CH
1.0000 | ORAL_TABLET | Freq: Every day | ORAL | Status: DC
  Administered 2012-07-19: 1 via ORAL
  Filled 2012-07-18: qty 1

## 2012-07-18 MED ORDER — TETANUS-DIPHTH-ACELL PERTUSSIS 5-2.5-18.5 LF-MCG/0.5 IM SUSP: 0.5000 mL | Freq: Once | INTRAMUSCULAR | Status: DC

## 2012-07-18 MED ORDER — HYDROXYZINE HCL 25 MG PO TABS
25.0000 mg | ORAL_TABLET | Freq: Four times a day (QID) | ORAL | Status: DC | PRN
  Administered 2012-07-18: 25 mg via ORAL
  Filled 2012-07-18 (×2): qty 1

## 2012-07-18 MED ORDER — TERBUTALINE SULFATE 1 MG/ML IJ SOLN: 0.2500 mg | Freq: Once | INTRAMUSCULAR | Status: DC | PRN

## 2012-07-18 MED ORDER — OXYTOCIN 40 UNITS IN LACTATED RINGERS INFUSION - SIMPLE MED
1.0000 m[IU]/min | INTRAVENOUS | Status: DC
  Administered 2012-07-18: 2 m[IU]/min via INTRAVENOUS

## 2012-07-18 MED ORDER — NALBUPHINE SYRINGE 5 MG/0.5 ML
10.0000 mg | INJECTION | INTRAMUSCULAR | Status: DC | PRN
  Administered 2012-07-18: 10 mg via INTRAVENOUS
  Filled 2012-07-18 (×2): qty 1

## 2012-07-18 MED ORDER — MEDROXYPROGESTERONE ACETATE 150 MG/ML IM SUSP: 150.0000 mg | INTRAMUSCULAR | Status: DC | PRN

## 2012-07-18 MED ORDER — MEASLES, MUMPS & RUBELLA VAC ~~LOC~~ INJ: 0.5000 mL | INJECTION | Freq: Once | SUBCUTANEOUS | Status: DC

## 2012-07-18 MED ORDER — ZOLPIDEM TARTRATE 5 MG PO TABS: 5.0000 mg | ORAL_TABLET | Freq: Every evening | ORAL | Status: DC | PRN

## 2012-07-18 MED ORDER — IBUPROFEN 600 MG PO TABS
600.0000 mg | ORAL_TABLET | Freq: Four times a day (QID) | ORAL | Status: DC
  Administered 2012-07-18 – 2012-07-19 (×4): 600 mg via ORAL
  Filled 2012-07-18 (×4): qty 1

## 2012-07-18 MED ORDER — DIBUCAINE 1 % RE OINT: 1.0000 "application " | TOPICAL_OINTMENT | RECTAL | Status: DC | PRN

## 2012-07-18 MED ORDER — SENNOSIDES-DOCUSATE SODIUM 8.6-50 MG PO TABS
2.0000 | ORAL_TABLET | Freq: Every day | ORAL | Status: DC
  Administered 2012-07-18: 2 via ORAL

## 2012-07-18 MED ORDER — BENZOCAINE-MENTHOL 20-0.5 % EX AERO
1.0000 "application " | INHALATION_SPRAY | CUTANEOUS | Status: DC | PRN
  Filled 2012-07-18: qty 56

## 2012-07-18 MED ORDER — MAGNESIUM HYDROXIDE 400 MG/5ML PO SUSP: 30.0000 mL | ORAL | Status: DC | PRN

## 2012-07-18 MED ORDER — NALBUPHINE SYRINGE 5 MG/0.5 ML
10.0000 mg | INJECTION | INTRAMUSCULAR | Status: DC | PRN
  Administered 2012-07-18: 10 mg via INTRAMUSCULAR
  Filled 2012-07-18 (×2): qty 1

## 2012-07-18 MED ORDER — ONDANSETRON HCL 4 MG PO TABS
4.0000 mg | ORAL_TABLET | ORAL | Status: DC | PRN
  Administered 2012-07-18: 4 mg via ORAL
  Filled 2012-07-18 (×2): qty 1

## 2012-07-18 MED ORDER — WITCH HAZEL-GLYCERIN EX PADS: 1.0000 "application " | MEDICATED_PAD | CUTANEOUS | Status: DC | PRN

## 2012-07-18 MED ORDER — LANOLIN HYDROUS EX OINT: TOPICAL_OINTMENT | CUTANEOUS | Status: DC | PRN

## 2012-07-18 MED ORDER — FERROUS SULFATE 325 (65 FE) MG PO TABS
325.0000 mg | ORAL_TABLET | Freq: Two times a day (BID) | ORAL | Status: DC
  Administered 2012-07-18 – 2012-07-19 (×2): 325 mg via ORAL
  Filled 2012-07-18 (×2): qty 1

## 2012-07-18 MED ORDER — DIPHENHYDRAMINE HCL 25 MG PO CAPS: 25.0000 mg | ORAL_CAPSULE | Freq: Four times a day (QID) | ORAL | Status: DC | PRN

## 2012-07-18 MED ORDER — ONDANSETRON HCL 4 MG/2ML IJ SOLN: 4.0000 mg | INTRAMUSCULAR | Status: DC | PRN

## 2012-07-18 NOTE — Progress Notes (Signed)
Crystal Burns is a 32 y.o. [id number] at [redacted]w[redacted]d by LMP admitted for induction of labor due to patient request.  Subjective: Comfortable  Objective: BP 139/96  Pulse 83  Temp(Src) 98.2 F (36.8 C) (Oral)  Resp 20  Ht 5\' 3"  (1.6 m)  Wt 122.018 kg (269 lb)  BMI 47.66 kg/m2  LMP 09/07/2011      FHT:  FHR: 140 bpm, variability: moderate,  accelerations:  Present,  decelerations:  Absent UC:   irregular, every 5 minutes SVE:   Dilation: 2.5 Effacement (%): 70;60 Station: -2 Exam by:: Dr. Tamela Oddi  Labs: Lab Results  Component Value Date   WBC 7.7 07/17/2012   HGB 9.7* 07/17/2012   HCT 30.6* 07/17/2012   MCV 77.5* 07/17/2012   PLT 225 07/17/2012    Assessment / Plan: Early labor  Labor: see above Preeclampsia:  check labs, no signs or sxs of severe, superimposed PIH Fetal Wellbeing:  Category I Pain Control:  IV narcotics I/D:  n/a Anticipated MOD:  NSVD  JACKSON-MOORE,Shadi Larner A 07/18/2012, 10:11 AM

## 2012-07-18 NOTE — Progress Notes (Signed)
CSW aware of consult.  MOB currently in birthing suite.  CSW will consult when MOB is transferred to regular unit. [phone]

## 2012-07-18 NOTE — Progress Notes (Signed)
Dr Tamela Oddi updated on SVE, FHR, and inability to place 0400 dose of cytotec due to UC pattern.  Orders given to continue to watch UC pattern and place cytotec if possible.

## 2012-07-19 LAB — CBC
HCT: 31.8 % — ABNORMAL LOW (ref 36.0–46.0)
Hemoglobin: 10.1 g/dL — ABNORMAL LOW (ref 12.0–15.0)
MCH: 24.7 pg — ABNORMAL LOW (ref 26.0–34.0)
MCV: 77.8 fL — ABNORMAL LOW (ref 78.0–100.0)
RBC: 4.09 MIL/uL (ref 3.87–5.11)

## 2012-07-19 MED ORDER — OXYCODONE-ACETAMINOPHEN 5-325 MG PO TABS: 1.0000 | ORAL_TABLET | ORAL | Status: DC | PRN

## 2012-07-19 NOTE — Discharge Summary (Signed)
  Obstetric Discharge Summary Reason for Admission: induction of labor Prenatal Procedures: none Intrapartum Procedures: spontaneous vaginal delivery Postpartum Procedures: none Complications-Operative and Postpartum: none  Hemoglobin  Date Value Range Status  07/19/2012 10.1* 12.0 - 15.0 g/dL Final     HCT  Date Value Range Status  07/19/2012 31.8* 36.0 - 46.0 % Final    Physical Exam:  General: alert Lochia: appropriate Uterine: firm Incision: n/a DVT Evaluation: No evidence of DVT seen on physical exam.  Discharge Diagnoses: Active Problems:   Essential hypertension   Normal delivery   Discharge Information: Date: 07/19/2012 Activity: pelvic rest Diet: routine Medications:  Prior to Admission medications   Medication Sig Start Date End Date Taking? Authorizing Provider  oxyCODONE-acetaminophen (PERCOCET/ROXICET) 5-325 MG per tablet Take 1-2 tablets by mouth every 4 (four) hours as needed. 07/19/12   Antionette Char, MD    Condition: stable Instructions: refer to routine discharge instructions Discharge to: home Follow-up Information   Follow up with Antionette Char A, MD. Schedule an appointment as soon as possible for a visit in 2 days. (For B/P check)    Contact information:   [address] [place] [address] [phone]       Newborn Data: Live born  Information for the patient's newborn:  Aysiah, Jurado Girl Shahana [[id number]]  female ; APGAR (1 MIN): 9   APGAR (5 MINS): 9     Home with mother.  JACKSON-MOORE,Maahir Horst A 07/19/2012, 12:05 PM

## 2012-07-20 NOTE — Progress Notes (Signed)
Post discharge chart review completed.  

## 2012-07-27 ENCOUNTER — Ambulatory Visit (INDEPENDENT_AMBULATORY_CARE_PROVIDER_SITE_OTHER): Payer: Medicaid Other | Admitting: Obstetrics & Gynecology

## 2012-07-27 VITALS — BP 139/100 | HR 61 | Wt 252.0 lb

## 2012-07-27 DIAGNOSIS — IMO0001 Reserved for inherently not codable concepts without codable children: Secondary | ICD-10-CM

## 2012-07-27 DIAGNOSIS — O1002 Pre-existing essential hypertension complicating childbirth: Secondary | ICD-10-CM

## 2012-07-27 MED ORDER — NIFEDIPINE ER OSMOTIC RELEASE 30 MG PO TB24: 30.0000 mg | ORAL_TABLET | Freq: Every day | ORAL | Status: DC

## 2012-07-27 NOTE — Progress Notes (Signed)
Pt in office today for blood pressure check. Pt reports she delivered 07/18/2012, vaginally, and her blood pressure was high at the hospital. Per Dr Tamela Oddi - Procardia 30XL and recheck next week.

## 2012-08-03 ENCOUNTER — Ambulatory Visit (INDEPENDENT_AMBULATORY_CARE_PROVIDER_SITE_OTHER): Payer: Medicaid Other | Admitting: Obstetrics & Gynecology

## 2012-08-03 ENCOUNTER — Encounter: Payer: Self-pay | Admitting: *Deleted

## 2012-08-03 NOTE — Patient Instructions (Addendum)

## 2012-08-03 NOTE — Progress Notes (Signed)
Subjective:     Crystal Burns is a 32 y.o. female here for a routine exam.  Current complaints: Pt here today for a blood pressure check. Pt states she also wants to discuss birth control. Pt states she is interested in the Nexplanon.  Personal health questionnaire reviewed: yes.   Gynecologic History No LMP recorded. Patient is not currently having periods (Reason: Lactating). Contraception: abstinence Last Pap: 2013. Results were: normal Last mammogram: n/a. Results were: n/a   Obstetric History OB History   Grav Para Term Preterm Abortions TAB SAB Ect Mult Living   9 6 6  3  3   5      # Outc Date GA Lbr Len/2nd Wgt Sex Del Anes PTL Lv   1 TRM 4/14 [redacted]w[redacted]d 12:16 / 00:00 7lb6.9oz(3.37kg) F SVD Local  Yes   Comments: na   2 SAB            Comments: Molar with D&C   3 SAB            4 TRM      SVD      5 TRM      SVD      6 TRM      SVD   ND   Comments: SIDS   7 TRM      SVD      8 TRM      SVD      9 SAB                The following portions of the patient's history were reviewed and updated as appropriate: allergies, current medications, past family history, past medical history, past social history, past surgical history and problem list.  Review of Systems Pertinent items are noted in HPI.    Objective:   General:  alert     Abdomen: soft, non-tender; bowel sounds normal; no masses,  no organomegaly   Vulva:  normal  Vagina: normal vagina  Cervix:  no lesions  Corpus: normal size, contour, position, consistency, mobility, non-tender  Adnexa:  normal adnexa    Assessment:    Healthy female exam.  B/Ps OK   Plan:   Return for Nexplanon insertion

## 2012-08-27 ENCOUNTER — Encounter: Payer: Self-pay | Admitting: Obstetrics & Gynecology

## 2012-08-27 ENCOUNTER — Ambulatory Visit (INDEPENDENT_AMBULATORY_CARE_PROVIDER_SITE_OTHER): Payer: Medicaid Other | Admitting: Obstetrics & Gynecology

## 2012-08-27 VITALS — BP 113/79 | HR 81 | Temp 98.9°F | Wt 239.0 lb

## 2012-08-27 DIAGNOSIS — Z30017 Encounter for initial prescription of implantable subdermal contraceptive: Secondary | ICD-10-CM

## 2012-08-27 DIAGNOSIS — Z3202 Encounter for pregnancy test, result negative: Secondary | ICD-10-CM

## 2012-08-27 DIAGNOSIS — IMO0001 Reserved for inherently not codable concepts without codable children: Secondary | ICD-10-CM

## 2012-08-27 LAB — POCT URINE PREGNANCY: Preg Test, Ur: NEGATIVE

## 2012-08-27 NOTE — Progress Notes (Deleted)
Subjective:     Crystal Burns is a 32 y.o. female who presents for a postpartum visit. She is 6 weeks postpartum following a spontaneous vaginal delivery. I have fully reviewed the prenatal and intrapartum course. The delivery was at 39 gestational weeks. Outcome: spontaneous vaginal delivery. Anesthesia: IV sedation. Postpartum course has been normal. Baby's course has been normal. Baby is feeding by bottle - Gerber-Soothe. Bleeding {vag bleed:12292}. Bowel function is {normal:32111}. Bladder function is {normal:32111}. Patient is not sexually active. Contraception method is abstinence. Postpartum depression screening: {neg default:13464::"negative"}.  {Common ambulatory SmartLinks:19316}  Review of Systems {ros; complete:30496}   Objective:    BP 113/79  Pulse 81  Temp(Src) 98.9 F (37.2 C)  Wt 239 lb (108.41 kg)  BMI 42.35 kg/m2  Breastfeeding? No  General:  {gen appearance:16600}   Breasts:  {breast exam:1202::"inspection negative, no nipple discharge or bleeding, no masses or nodularity palpable"}  Lungs: {lung exam:16931}  Heart:  {heart exam:5510}  Abdomen: {abdomen exam:16834}   Vulva:  {labia exam:12198}  Vagina: {vagina exam:12200}  Cervix:  {cervix exam:14595}  Corpus: {uterus exam:12215}  Adnexa:  {adnexa exam:12223}  Rectal Exam: {rectal/vaginal exam:12274}        Assessment:    *** postpartum exam. Pap smear {done:10129} at today's visit.   Plan:    1. Contraception: {method:5051} 2. *** 3. Follow up in: {1-10:13787} {time; units:19136} or as needed.   NEXPLANON INSERTION NOTE  Date of LMP:   Recently delivered  Contraception used: abstience  Pregnancy test result:  Lab Results  Component Value Date   PREGTESTUR  Value: POSITIVE        THE SENSITIVITY OF THIS METHODOLOGY IS >24 mIU/mL 02/05/2010    Indications:  The patient desires contraception.  She understands risks, benefits, and alternatives to Implanon and would like to  proceed.  Anesthesia:   Lidocaine 1% plain.  Procedure:  A time-out was performed confirming the procedure and the patient's allergy status.  The patient's non-dominant was identified as the {left/right:311354} arm.  The protection cap was removed. While placing countertraction on the skin, the needle was inserted at a 30 degree angle.  The applicator was held horizontal to the skin; the skin was tented upward as the needle was introduced into the subdermal space.  While holding the applicator in place, the slider was unlocked. The Nexplanon was removed from the field.  The Nexplanon was palpated to ensure proper placement.  Complications: {Mis complications:31741}  Instructions:  The patient was instructed to remove the dressing in 24 hours and that some bruising is to be expected.  She was advised to use over the counter analgesics as needed for any pain at the site.  She is to keep the area dry for 24 hours and to call if her hand or arm becomes cold, numb, or blue.  Return visit:  Return in {1-6 weeks:20341}

## 2012-08-27 NOTE — Patient Instructions (Signed)

## 2012-08-27 NOTE — Progress Notes (Signed)
NEXPLANON INSERTION NOTE      Pregnancy test result:  Lab Results  Component Value Date   PREGTESTUR Negative 08/27/2012    Indications:  The patient desires contraception.  She understands risks, benefits, and alternatives to Implanon and would like to proceed.  Anesthesia:   Lidocaine 1% plain.  Procedure:  A time-out was performed confirming the procedure and the patient's allergy status.  The patient's non-dominant was identified as the left arm.  The protection cap was removed. While placing countertraction on the skin, the needle was inserted at a 30 degree angle.  The applicator was held horizontal to the skin; the skin was tented upward as the needle was introduced into the subdermal space.  While holding the applicator in place, the slider was unlocked. The Nexplanon was removed from the field.  The Nexplanon was palpated to ensure proper placement.  Complications: None  Instructions:  The patient was instructed to remove the dressing in 24 hours and that some bruising is to be expected.  She was advised to use over the counter analgesics as needed for any pain at the site.  She is to keep the area dry for 24 hours and to call if her hand or arm becomes cold, numb, or blue.  Return visit:  Return in 6+ weeks

## 2012-09-02 ENCOUNTER — Telehealth: Payer: Self-pay | Admitting: *Deleted

## 2012-09-02 ENCOUNTER — Ambulatory Visit: Payer: Medicaid Other | Admitting: Obstetrics & Gynecology

## 2012-09-02 NOTE — Telephone Encounter (Signed)
error 

## 2012-09-10 ENCOUNTER — Ambulatory Visit: Payer: Medicaid Other | Admitting: Obstetrics & Gynecology

## 2012-09-13 ENCOUNTER — Encounter: Payer: Self-pay | Admitting: Obstetrics & Gynecology

## 2012-09-18 ENCOUNTER — Encounter: Payer: Self-pay | Admitting: Obstetrics & Gynecology

## 2012-10-02 ENCOUNTER — Encounter: Payer: Self-pay | Admitting: Obstetrics & Gynecology

## 2012-12-09 ENCOUNTER — Ambulatory Visit (INDEPENDENT_AMBULATORY_CARE_PROVIDER_SITE_OTHER): Payer: Medicaid Other | Admitting: Obstetrics & Gynecology

## 2012-12-09 ENCOUNTER — Encounter: Payer: Self-pay | Admitting: Obstetrics & Gynecology

## 2012-12-09 VITALS — BP 141/91 | HR 67 | Temp 99.1°F | Ht 63.0 in | Wt 247.4 lb

## 2012-12-09 DIAGNOSIS — Z3046 Encounter for surveillance of implantable subdermal contraceptive: Secondary | ICD-10-CM

## 2012-12-09 NOTE — Progress Notes (Signed)
 .  NEXPLANON REMOVAL NOTE  Date of LMP:   unknown  Contraception used: *Nexplanon     Anesthesia:   Lidocaine 1% plain.  Procedure:  A time-out was performed confirming the procedure and the patient's allergy status.  Complications: None                      The rod was palpated and the area was sterilely prepped.  The area beneath the distal tip was anesthetized with 1% xylocaine and the skin incised                       Over the tip and the tip was exposed, grasped with forcep and removed intact.  A single suture of 4-0 Vicryl was used to close incision.  Steri strip                       And a bandage applied and the arm was wrapped with gauze bandage.  The patient tolerated well.  Instructions:  The patient was instructed to remove the dressing in 24 hours and that some bruising is to be expected.  She was advised to use over the counter analgesics as needed for any pain at the site.  She is to keep the area dry for 24 hours and to call if her hand or arm becomes cold, numb, or blue.  Return visit:  prn

## 2012-12-11 ENCOUNTER — Encounter: Payer: Self-pay | Admitting: Obstetrics & Gynecology

## 2012-12-11 NOTE — Patient Instructions (Signed)
Contraception Choices  Contraception (birth control) is the use of any methods or devices to prevent pregnancy. Below are some methods to help avoid pregnancy.  HORMONAL METHODS   · Contraceptive implant. This is a thin, plastic tube containing progesterone hormone. It does not contain estrogen hormone. Your caregiver inserts the tube in the inner part of the upper arm. The tube can remain in place for up to 3 years. After 3 years, the implant must be removed. The implant prevents the ovaries from releasing an egg (ovulation), thickens the cervical mucus which prevents sperm from entering the uterus, and thins the lining of the inside of the uterus.  · Progesterone-only injections. These injections are given every 3 months by your caregiver to prevent pregnancy. This synthetic progesterone hormone stops the ovaries from releasing eggs. It also thickens cervical mucus and changes the uterine lining. This makes it harder for sperm to survive in the uterus.  · Birth control pills. These pills contain estrogen and progesterone hormone. They work by stopping the egg from forming in the ovary (ovulation). Birth control pills are prescribed by a caregiver. Birth control pills can also be used to treat heavy periods.  · Minipill. This type of birth control pill contains only the progesterone hormone. They are taken every day of each month and must be prescribed by your caregiver.  · Birth control patch. The patch contains hormones similar to those in birth control pills. It must be changed once a week and is prescribed by a caregiver.  · Vaginal ring. The ring contains hormones similar to those in birth control pills. It is left in the vagina for 3 weeks, removed for 1 week, and then a new one is put back in place. The patient must be comfortable inserting and removing the ring from the vagina. A caregiver's prescription is necessary.  · Emergency contraception. Emergency contraceptives prevent pregnancy after unprotected  sexual intercourse. This pill can be taken right after sex or up to 5 days after unprotected sex. It is most effective the sooner you take the pills after having sexual intercourse. Emergency contraceptive pills are available without a prescription. Check with your pharmacist. Do not use emergency contraception as your only form of birth control.  BARRIER METHODS   · Female condom. This is a thin sheath (latex or rubber) that is worn over the penis during sexual intercourse. It can be used with spermicide to increase effectiveness.  · Female condom. This is a soft, loose-fitting sheath that is put into the vagina before sexual intercourse.  · Diaphragm. This is a soft, latex, dome-shaped barrier that must be fitted by a caregiver. It is inserted into the vagina, along with a spermicidal jelly. It is inserted before intercourse. The diaphragm should be left in the vagina for 6 to 8 hours after intercourse.  · Cervical cap. This is a round, soft, latex or plastic cup that fits over the cervix and must be fitted by a caregiver. The cap can be left in place for up to 48 hours after intercourse.  · Sponge. This is a soft, circular piece of polyurethane foam. The sponge has spermicide in it. It is inserted into the vagina after wetting it and before sexual intercourse.  · Spermicides. These are chemicals that kill or block sperm from entering the cervix and uterus. They come in the form of creams, jellies, suppositories, foam, or tablets. They do not require a prescription. They are inserted into the vagina with an applicator before having sexual intercourse.   The process must be repeated every time you have sexual intercourse.  INTRAUTERINE CONTRACEPTION  · Intrauterine device (IUD). This is a T-shaped device that is put in a woman's uterus during a menstrual period to prevent pregnancy. There are 2 types:  · Copper IUD. This type of IUD is wrapped in copper wire and is placed inside the uterus. Copper makes the uterus and  fallopian tubes produce a fluid that kills sperm. It can stay in place for 10 years.  · Hormone IUD. This type of IUD contains the hormone progestin (synthetic progesterone). The hormone thickens the cervical mucus and prevents sperm from entering the uterus, and it also thins the uterine lining to prevent implantation of a fertilized egg. The hormone can weaken or kill the sperm that get into the uterus. It can stay in place for 5 years.  PERMANENT METHODS OF CONTRACEPTION  · Female tubal ligation. This is when the woman's fallopian tubes are surgically sealed, tied, or blocked to prevent the egg from traveling to the uterus.  · Female sterilization. This is when the female has the tubes that carry sperm tied off (vasectomy). This blocks sperm from entering the vagina during sexual intercourse. After the procedure, the man can still ejaculate fluid (semen).  NATURAL PLANNING METHODS  · Natural family planning. This is not having sexual intercourse or using a barrier method (condom, diaphragm, cervical cap) on days the woman could become pregnant.  · Calendar method. This is keeping track of the length of each menstrual cycle and identifying when you are fertile.  · Ovulation method. This is avoiding sexual intercourse during ovulation.  · Symptothermal method. This is avoiding sexual intercourse during ovulation, using a thermometer and ovulation symptoms.  · Post-ovulation method. This is timing sexual intercourse after you have ovulated.  Regardless of which type or method of contraception you choose, it is important that you use condoms to protect against the transmission of sexually transmitted diseases (STDs). Talk with your caregiver about which form of contraception is most appropriate for you.  Document Released: 03/25/2005 Document Revised: 06/17/2011 Document Reviewed: 08/01/2010  [employer]® Patient Information ©2014 [employer].

## 2012-12-17 ENCOUNTER — Ambulatory Visit: Payer: Medicaid Other | Admitting: Obstetrics & Gynecology

## 2012-12-18 ENCOUNTER — Ambulatory Visit: Payer: Medicaid Other | Admitting: Advanced Practice Midwife

## 2013-02-15 ENCOUNTER — Ambulatory Visit (INDEPENDENT_AMBULATORY_CARE_PROVIDER_SITE_OTHER): Payer: Medicaid Other | Admitting: Obstetrics & Gynecology

## 2013-02-15 ENCOUNTER — Encounter: Payer: Self-pay | Admitting: Obstetrics & Gynecology

## 2013-02-15 VITALS — BP 136/91 | HR 75 | Wt 246.0 lb

## 2013-02-15 DIAGNOSIS — N912 Amenorrhea, unspecified: Secondary | ICD-10-CM

## 2013-02-15 LAB — POCT URINE PREGNANCY: Preg Test, Ur: NEGATIVE

## 2013-02-15 NOTE — Patient Instructions (Signed)
Contraception Choices Contraception (birth control) is the use of any methods or devices to prevent pregnancy. Below are some methods to help avoid pregnancy. HORMONAL METHODS   Contraceptive implant This is a thin, plastic tube containing progesterone hormone. It does not contain estrogen hormone. Your health care provider inserts the tube in the inner part of the upper arm. The tube can remain in place for up to 3 years. After 3 years, the implant must be removed. The implant prevents the ovaries from releasing an egg (ovulation), thickens the cervical mucus to prevent sperm from entering the uterus, and thins the lining of the inside of the uterus.  Progesterone-only injections These injections are given every 3 months by your health care provider to prevent pregnancy. This synthetic progesterone hormone stops the ovaries from releasing eggs. It also thickens cervical mucus and changes the uterine lining. This makes it harder for sperm to survive in the uterus.  Birth control pills These pills contain estrogen and progesterone hormone. They work by preventing the ovaries from releasing eggs (ovulation). They also cause the cervical mucus to thicken, preventing the sperm from entering the uterus. Birth control pills are prescribed by a health care provider.Birth control pills can also be used to treat heavy periods.  Minipill This type of birth control pill contains only the progesterone hormone. They are taken every day of each month and must be prescribed by your health care provider.  Birth control patch The patch contains hormones similar to those in birth control pills. It must be changed once a week and is prescribed by a health care provider.  Vaginal ring The ring contains hormones similar to those in birth control pills. It is left in the vagina for 3 weeks, removed for 1 week, and then a new one is put back in place. The patient must be comfortable inserting and removing the ring from the  vagina.A health care provider's prescription is necessary.  Emergency contraception Emergency contraceptives prevent pregnancy after unprotected sexual intercourse. This pill can be taken right after sex or up to 5 days after unprotected sex. It is most effective the sooner you take the pills after having sexual intercourse. Most emergency contraceptive pills are available without a prescription. Check with your pharmacist. Do not use emergency contraception as your only form of birth control. BARRIER METHODS   Female condom This is a thin sheath (latex or rubber) that is worn over the penis during sexual intercourse. It can be used with spermicide to increase effectiveness.  Female condom. This is a soft, loose-fitting sheath that is put into the vagina before sexual intercourse.  Diaphragm This is a soft, latex, dome-shaped barrier that must be fitted by a health care provider. It is inserted into the vagina, along with a spermicidal jelly. It is inserted before intercourse. The diaphragm should be left in the vagina for 6 to 8 hours after intercourse.  Cervical cap This is a round, soft, latex or plastic cup that fits over the cervix and must be fitted by a health care provider. The cap can be left in place for up to 48 hours after intercourse.  Sponge This is a soft, circular piece of polyurethane foam. The sponge has spermicide in it. It is inserted into the vagina after wetting it and before sexual intercourse.  Spermicides These are chemicals that kill or block sperm from entering the cervix and uterus. They come in the form of creams, jellies, suppositories, foam, or tablets. They do not require a   prescription. They are inserted into the vagina with an applicator before having sexual intercourse. The process must be repeated every time you have sexual intercourse. INTRAUTERINE CONTRACEPTION  Intrauterine device (IUD) This is a T-shaped device that is put in a woman's uterus during a  menstrual period to prevent pregnancy. There are 2 types:  Copper IUD This type of IUD is wrapped in copper wire and is placed inside the uterus. Copper makes the uterus and fallopian tubes produce a fluid that kills sperm. It can stay in place for 10 years.  Hormone IUD This type of IUD contains the hormone progestin (synthetic progesterone). The hormone thickens the cervical mucus and prevents sperm from entering the uterus, and it also thins the uterine lining to prevent implantation of a fertilized egg. The hormone can weaken or kill the sperm that get into the uterus. It can stay in place for 3 5 years, depending on which type of IUD is used. PERMANENT METHODS OF CONTRACEPTION  Female tubal ligation This is when the woman's fallopian tubes are surgically sealed, tied, or blocked to prevent the egg from traveling to the uterus.  Hysteroscopic sterilization This involves placing a small coil or insert into each fallopian tube. Your doctor uses a technique called hysteroscopy to do the procedure. The device causes scar tissue to form. This results in permanent blockage of the fallopian tubes, so the sperm cannot fertilize the egg. It takes about 3 months after the procedure for the tubes to become blocked. You must use another form of birth control for these 3 months.  Female sterilization This is when the female has the tubes that carry sperm tied off (vasectomy).This blocks sperm from entering the vagina during sexual intercourse. After the procedure, the man can still ejaculate fluid (semen). NATURAL PLANNING METHODS  Natural family planning This is not having sexual intercourse or using a barrier method (condom, diaphragm, cervical cap) on days the woman could become pregnant.  Calendar method This is keeping track of the length of each menstrual cycle and identifying when you are fertile.  Ovulation method This is avoiding sexual intercourse during ovulation.  Symptothermal method This is  avoiding sexual intercourse during ovulation, using a thermometer and ovulation symptoms.  Post ovulation method This is timing sexual intercourse after you have ovulated. Regardless of which type or method of contraception you choose, it is important that you use condoms to protect against the transmission of sexually transmitted infections (STIs). Talk with your health care provider about which form of contraception is most appropriate for you. Document Released: 03/25/2005 Document Revised: 11/25/2012 Document Reviewed: 09/17/2012 [employer] Patient Information 2014 [employer].  

## 2013-02-15 NOTE — Progress Notes (Signed)
Subjective:     Crystal Burns is a 32 y.o. female here for a routine exam.  Current complaints: Patient states she messed up her pill pack and is having breast tenderness and bloating with gas. Patient states her cycle is 10 days late and her UPT at home was negative..  Personal health questionnaire reviewed: no.   Gynecologic History No LMP recorded. Contraception: OCP (estrogen/progesterone)    Obstetric History OB History  Gravida Para Term Preterm AB SAB TAB Ectopic Multiple Living  9 6 6  3 3    5     # Outcome Date GA Lbr Len/2nd Weight Sex Delivery Anes PTL Lv  9 TRM 07/18/12 [redacted]w[redacted]d 12:16 7 lb 6.9 oz (3.37 kg) F SVD Local  Y     Comments: na  8 TRM      SVD     7 TRM      SVD     6 TRM      SVD   ND     Comments: SIDS  5 TRM      SVD     4 TRM      SVD     3 SAB           2 SAB           1 SAB              Comments: Molar with D&C       The following portions of the patient's history were reviewed and updated as appropriate: allergies, current medications, past family history, past medical history, past social history, past surgical history and problem list.  Review of Systems Pertinent items are noted in HPI.    Objective:   No exam today   Assessment:    Symptoms of pregnancy reviewed; missed one pill and doubled up  Plan:     Alternative contraceptive methods reviewed Ordered Check quantitative bHCG Return prn

## 2013-02-16 LAB — HCG, QUANTITATIVE, PREGNANCY: hCG, Beta Chain, Quant, S: <2 m[IU]/mL

## 2013-02-17 ENCOUNTER — Other Ambulatory Visit: Payer: Self-pay | Admitting: *Deleted

## 2013-02-17 DIAGNOSIS — IMO0001 Reserved for inherently not codable concepts without codable children: Secondary | ICD-10-CM

## 2013-02-17 MED ORDER — NORETHIN-ETH ESTRAD-FE BIPHAS 1 MG-10 MCG / 10 MCG PO TABS: 1.0000 | ORAL_TABLET | Freq: Every day | ORAL | Status: DC

## 2013-05-24 ENCOUNTER — Encounter: Payer: Self-pay | Admitting: Obstetrics & Gynecology

## 2013-05-24 ENCOUNTER — Ambulatory Visit (INDEPENDENT_AMBULATORY_CARE_PROVIDER_SITE_OTHER): Payer: Medicaid Other | Admitting: Obstetrics & Gynecology

## 2013-05-24 VITALS — BP 136/96 | HR 86 | Temp 98.6°F | Ht 63.0 in | Wt 270.0 lb

## 2013-05-24 DIAGNOSIS — Z3201 Encounter for pregnancy test, result positive: Secondary | ICD-10-CM

## 2013-05-24 LAB — POCT URINE PREGNANCY: Preg Test, Ur: POSITIVE

## 2013-05-24 MED ORDER — CITRANATAL ASSURE 300 MG PO MISC: 1.0000 | Freq: Every day | ORAL | Status: DC

## 2013-05-24 NOTE — Progress Notes (Signed)
Subjective:     Crystal Burns is a 33 y.o. female here for a routine exam.  Current complaints: patient in office today for a confirmation of pregnancy. Pregnancy test in office was positive. Patient states she is having some cramping.   Personal health questionnaire reviewed: yes.   Gynecologic History Patient's last menstrual period was 03/27/2013. Contraception: OCP (estrogen/progesterone) Last Dose: 05-19-13  Obstetric History OB History  Gravida Para Term Preterm AB SAB TAB Ectopic Multiple Living  10 6 6  3 3    5     # Outcome Date GA Lbr Len/2nd Weight Sex Delivery Anes PTL Lv  10 CUR           9 TRM 07/18/12 [id number] 12:16 7 lb 6.9 oz (3.37 kg) F SVD Local  Y     Comments: na  8 TRM      SVD     7 TRM      SVD     6 TRM      SVD   ND     Comments: SIDS  5 TRM      SVD     4 TRM      SVD     3 SAB           2 SAB           1 SAB              Comments: Molar with D&C       The following portions of the patient's history were reviewed and updated as appropriate: allergies, current medications, past family history, past medical history, past social history, past surgical history and problem list.  Review of Systems Pertinent items are noted in HPI.    Objective:     Exam deferred     Assessment:   Early pregnant  Plan:   Orders Placed This Encounter  Procedures  . B-HCG Quant  . POCT urine pregnancy  U/S for dating Return to start prenatal care

## 2013-05-25 LAB — HCG, QUANTITATIVE, PREGNANCY: hCG, Beta Chain, Quant, S: 464.4 m[IU]/mL

## 2013-05-28 ENCOUNTER — Other Ambulatory Visit: Payer: Self-pay | Admitting: *Deleted

## 2013-05-28 DIAGNOSIS — O3680X0 Pregnancy with inconclusive fetal viability, not applicable or unspecified: Secondary | ICD-10-CM

## 2013-05-31 ENCOUNTER — Ambulatory Visit (HOSPITAL_COMMUNITY)
Admission: RE | Admit: 2013-05-31 | Discharge: 2013-05-31 | Disposition: A | Discharge status: Home / Self Care | Payer: Medicaid Other | Source: Ambulatory Visit | Attending: Obstetrics & Gynecology | Admitting: Obstetrics & Gynecology

## 2013-05-31 ENCOUNTER — Other Ambulatory Visit: Payer: Self-pay | Admitting: Obstetrics & Gynecology

## 2013-05-31 DIAGNOSIS — O3680X0 Pregnancy with inconclusive fetal viability, not applicable or unspecified: Secondary | ICD-10-CM

## 2013-05-31 DIAGNOSIS — Z3689 Encounter for other specified antenatal screening: Secondary | ICD-10-CM | POA: Insufficient documentation

## 2013-06-04 ENCOUNTER — Inpatient Hospital Stay (HOSPITAL_COMMUNITY)
Admission: AD | Admit: 2013-06-04 | Discharge: 2013-06-04 | Disposition: A | Discharge status: Home / Self Care | Payer: Medicaid Other | Source: Ambulatory Visit | Attending: Obstetrics & Gynecology | Admitting: Obstetrics & Gynecology

## 2013-06-04 ENCOUNTER — Encounter (HOSPITAL_COMMUNITY): Payer: Self-pay

## 2013-06-04 DIAGNOSIS — R109 Unspecified abdominal pain: Secondary | ICD-10-CM | POA: Insufficient documentation

## 2013-06-04 DIAGNOSIS — O209 Hemorrhage in early pregnancy, unspecified: Secondary | ICD-10-CM

## 2013-06-04 LAB — HCG, QUANTITATIVE, PREGNANCY: HCG, BETA CHAIN, QUANT, S: 258 m[IU]/mL — AB (ref <5)

## 2013-06-04 LAB — URINALYSIS, ROUTINE W REFLEX MICROSCOPIC
Bilirubin Urine: NEGATIVE
GLUCOSE, UA: NEGATIVE mg/dL
Ketones, ur: NEGATIVE mg/dL
LEUKOCYTES UA: NEGATIVE
NITRITE: NEGATIVE
PH: 6 (ref 5.0–8.0)
Protein, ur: NEGATIVE mg/dL
Specific Gravity, Urine: 1.01 (ref 1.005–1.030)
Urobilinogen, UA: 0.2 mg/dL (ref 0.0–1.0)

## 2013-06-04 LAB — URINE MICROSCOPIC-ADD ON

## 2013-06-04 NOTE — Discharge Instructions (Signed)
Your pregnancy hormone level today has dropped to 258. Last week it was over 400. With a normal pregnancy the number should double every 2 days. This appears to be a failed pregnancy. You will need to follow up with Dr. Tamela OddiJackson-Moore on Monday for re evaluation. If you have problems before then such as heavy bleeding, severe cramping, fever or other problems.

## 2013-06-04 NOTE — MAU Provider Note (Signed)
Crystal Burns is a 33 y.o. female who presents to the MAU with bleeding in early pregnancy. She was in the office last week and had a BHCG of 464 and ultrasound that showed a 5 week GS, no YS. Today the Bhcg has dropped to 258. She denies pain just the bleeding.   Results for orders placed during the hospital encounter of 06/04/13 (from the past 24 hour(s))  URINALYSIS, ROUTINE W REFLEX MICROSCOPIC     Status: Abnormal   Collection Time    06/04/13 11:40 AM      Result Value Ref Range   Color, Urine YELLOW  YELLOW   APPearance CLEAR  CLEAR   Specific Gravity, Urine 1.010  1.005 - 1.030   pH 6.0  5.0 - 8.0   Glucose, UA NEGATIVE  NEGATIVE mg/dL   Hgb urine dipstick LARGE (*) NEGATIVE   Bilirubin Urine NEGATIVE  NEGATIVE   Ketones, ur NEGATIVE  NEGATIVE mg/dL   Protein, ur NEGATIVE  NEGATIVE mg/dL   Urobilinogen, UA 0.2  0.0 - 1.0 mg/dL   Nitrite NEGATIVE  NEGATIVE   Leukocytes, UA NEGATIVE  NEGATIVE  URINE MICROSCOPIC-ADD ON     Status: Abnormal   Collection Time    06/04/13 11:40 AM      Result Value Ref Range   Squamous Epithelial / LPF FEW (*) RARE   WBC, UA 3-6  <3 WBC/hpf   RBC / HPF 3-6  <3 RBC/hpf   Bacteria, UA FEW (*) RARE   Urine-Other MUCOUS PRESENT    HCG, QUANTITATIVE, PREGNANCY     Status: Abnormal   Collection Time    06/04/13 12:22 PM      Result Value Ref Range   hCG, Beta Chain, Quant, S 258 (*) <5 mIU/mL   I have reviewed this patient's vital signs, nurses notes, appropriate labs and imaging.  I have discussed findings and plan of care with the patient and she voices understanding. She will call Dr. Marcia BrashJackson-Moore's office for a follow up appointment. She will return here for any problems such as fever, heavy bleeding, severe pain or other problems. Stable for discharge without any signs of hemorrhage or problems at this time.

## 2013-06-04 NOTE — MAU Note (Signed)
Patient states she has had spotting of blood with wiping since yesterday. Sometime brown, sometime red. Has been having upper abdominal pain that she thinks is gas.

## 2013-06-09 ENCOUNTER — Other Ambulatory Visit: Payer: Medicaid Other

## 2013-06-09 ENCOUNTER — Ambulatory Visit: Payer: Medicaid Other | Admitting: Obstetrics & Gynecology

## 2013-06-14 ENCOUNTER — Ambulatory Visit: Payer: Medicaid Other | Admitting: Obstetrics & Gynecology

## 2013-06-17 ENCOUNTER — Ambulatory Visit: Payer: Medicaid Other | Admitting: Obstetrics & Gynecology

## 2013-07-22 ENCOUNTER — Ambulatory Visit: Payer: Medicaid Other | Admitting: Obstetrics & Gynecology

## 2013-07-29 ENCOUNTER — Encounter: Payer: Self-pay | Admitting: Obstetrics & Gynecology

## 2013-07-29 ENCOUNTER — Ambulatory Visit (INDEPENDENT_AMBULATORY_CARE_PROVIDER_SITE_OTHER): Payer: Medicaid Other | Admitting: Obstetrics & Gynecology

## 2013-07-29 VITALS — BP 150/90 | HR 72 | Wt 275.0 lb

## 2013-07-29 DIAGNOSIS — IMO0001 Reserved for inherently not codable concepts without codable children: Secondary | ICD-10-CM

## 2013-07-29 DIAGNOSIS — Z309 Encounter for contraceptive management, unspecified: Secondary | ICD-10-CM

## 2013-07-29 LAB — POCT URINE PREGNANCY: Preg Test, Ur: NEGATIVE

## 2013-07-29 NOTE — Progress Notes (Signed)
Patient ID: Crystal Burns, female   DOB: 02/06/1981, 33 y.o.   MRN: [id number]  Chief Complaint  Patient presents with  . Follow-up    SAB    HPI Crystal Burns is a 33 y.o. female.   Vaginal Bleeding The patient's pertinent negatives include no vaginal bleeding. Episode frequency: Minimal bleeding since passing the POC several weeks ago. The patient is experiencing no pain. She is not pregnant. Her past medical history is significant for miscarriage.    Past Medical History  Diagnosis Date  . Hypertension   . Asthma   . Gestational diabetes     2012  . Depression   . History of gonorrhea     Past Surgical History  Procedure Laterality Date  . Dilation and curettage of uterus    . Cholecystectomy    . Wisdom tooth extraction      Family History  Problem Relation Age of Onset  . Hypertension Mother   . Diabetes Mother   . Cancer Mother   . Hypertension Father   . Diabetes Father   . Hyperlipidemia Father   . Arthritis    . Asthma    . Breast cancer    . Heart failure    . Congenital heart disease    . Depression    . Heart attack      Social History History  Substance Use Topics  . Smoking status: Former Smoker    Types: Cigarettes    Quit date: 10/31/2010  . Smokeless tobacco: Never Used  . Alcohol Use: Yes     Comment: Socially    Allergies  Allergen Reactions  . Shellfish Allergy Anaphylaxis  . Bee Venom Swelling    Current Outpatient Prescriptions  Medication Sig Dispense Refill  . NIFEdipine (PROCARDIA XL) 30 MG 24 hr tablet Take 1 tablet (30 mg total) by mouth daily.  30 tablet  3  . Prenat w/o A-FeCbGl-DSS-FA-DHA (CITRANATAL ASSURE) 300 MG MISC Take 1 capsule by mouth daily.  30 each  11   No current facility-administered medications for this visit.    Review of Systems Review of Systems  Genitourinary: Positive for vaginal bleeding.  Constitutional: negative for fatigue and weight loss Respiratory: negative for cough and  wheezing Cardiovascular: negative for chest pain, fatigue and palpitations Gastrointestinal: negative for abdominal pain and change in bowel habits Integument/breast: negative for nipple discharge Musculoskeletal:negative for myalgias Neurological: negative for gait problems and tremors Behavioral/Psych: negative for abusive relationship, depression Endocrine: negative for temperature intolerance     Blood pressure 150/90, pulse 72, weight 124.739 kg (275 lb), last menstrual period 07/05/2013, unknown if currently breastfeeding.  Physical Exam Physical Exam General:   alert  Skin:   no rash or abnormalities  Lungs:   clear to auscultation bilaterally  Heart:   regular rate and rhythm, S1, S2 normal, no murmur, click, rub or gallop  Breasts:   normal without suspicious masses, skin or nipple changes or axillary nodes  Abdomen:  normal findings: no organomegaly, soft, non-tender and no hernia  Pelvis:  External genitalia: normal general appearance Urinary system: urethral meatus normal and bladder without fullness, nontender Vaginal: normal without tenderness, induration or masses Cervix: normal appearance Adnexa: normal bimanual exam Uterus: anteverted and non-tender, normal size   Data Reviewed Labs, U/S  Assessment    Complete SAB  H/O multiple SAB  Plan    Possible management options include: LARC, sterilization Follow up as needed.  Antionette CharLisa Jackson-Moore 07/29/2013, 4:11 PM

## 2013-08-01 ENCOUNTER — Encounter: Payer: Self-pay | Admitting: Obstetrics & Gynecology

## 2013-08-01 NOTE — Patient Instructions (Signed)
Sterilization Information, Female Female sterilization is a procedure to permanently prevent pregnancy. There are different ways to perform sterilization, but all either block or close the fallopian tubes so that your eggs cannot reach your uterus. If your egg cannot reach your uterus, sperm cannot fertilize the egg, and you cannot get pregnant.  Sterilization is performed by a surgical procedure. Sometimes these procedures are performed in a hospital while a patient is asleep. Sometimes they can be done in a clinic setting with the patient awake. The fallopian tubes can be surgically cut, tied, or sealed through a procedure called tubal ligation. The fallopian tubes can also be closed with clips or rings. Sterilization can also be done by placing a tiny coil into each fallopian tube, which causes scar tissue to grow inside the tube. The scar tissue then blocks the tubes.  Discuss sterilization with your caregiver to answer any concerns you or your partner may have. You may want to ask what type of sterilization your caregiver performs. Some caregivers may not perform all the various options. Sterilization is permanent and should only be done if you are sure you do not want children or do not want any more children. Having a sterilization reversed may not be successful.  STERILIZATION PROCEDURES  Laparoscopic sterilization. This is a surgical method performed at a time other than right after childbirth. Two incisions are made in the lower abdomen. A thin, lighted tube (laparoscope) is inserted into one of the incisions and is used to perform the procedure. The fallopian tubes are closed with a ring or a clip. An instrument that uses heat could be used to seal the tubes closed (electrocautery).   Mini-laparotomy. This is a surgical method done 1 or 2 days after giving birth. Typically, a small incision is made just below the belly button (umbilicus) and the fallopian tubes are exposed. The tubes can then be  sealed, tied, or cut.   Hysteroscopic sterilization. This is performed at a time other than right after childbirth. A tiny, spring-like coil is inserted through the cervix and uterus and placed into the fallopian tubes. The coil causes scaring and blocks the tubes. Other forms of contraception should be used for 3 months after the procedure to allow the scar tissue to form completely. Additionally, it is required hysterosalpingography be done 3 months later to ensure that the procedure was successful. Hysterosalpingography is a procedure that uses X-rays to look at your uterus and fallopian tubes after a material to make them show up better has been inserted. IS STERILIZATION SAFE? Sterilization is considered safe with very rare complications. Risks depend on the type of procedure you have. As with any surgical procedure, there are risks. Some risks of sterilization by any means include:   Bleeding.  Infection.  Reaction to anesthesia medicine.  Injury to surrounding organs. Risks specific to having hysteroscopic coils placed include:  The coils may not be placed correctly the first time.   The coils may move out of place.   The tubes may not get completely blocked after 3 months.   Injury to surrounding organs when placing the coil.  HOW EFFECTIVE IS FEMALE STERILIZATION? Sterilization is nearly 100% effective, but it can fail. Depending on the type of sterilization, the rate of failure can be as high as 3%. After hysteroscopic sterilization with placement of fallopian tube coils, you will need back-up birth control for 3 months after the procedure. Sterilization is effective for a lifetime.  BENEFITS OF STERILIZATION  It does   not affect your hormones, and therefore will not affect your menstrual periods, sexual desire, or performance.   It is effective for a lifetime.   It is safe.   You do not need to worry about getting pregnant. Keep in mind that if you had the  hysteroscopic placement procedure, you must wait 3 months after the procedure (or until your caregiver confirms) before pregnancy is not considered possible.   There are no side effects unlike other types of birth control (contraception).  DRAWBACKS OF STERILIZATION  You must be sure you do not want children or any more children. The procedure is permanent.   It does not provide protection against sexually transmitted infections (STIs).   The tubes can grow back together. If this happens, there is a risk of pregnancy. There is also an increased risk (50%) of pregnancy being an ectopic pregnancy. This is a pregnancy that happens outside of the uterus. Document Released: 09/11/2007 Document Revised: 09/24/2011 Document Reviewed: 07/11/2011 Exit[employer] Patient Information 2014 [employer].  

## 2013-08-18 ENCOUNTER — Ambulatory Visit (INDEPENDENT_AMBULATORY_CARE_PROVIDER_SITE_OTHER): Payer: Medicaid Other | Admitting: Obstetrics & Gynecology

## 2013-08-18 ENCOUNTER — Encounter: Payer: Self-pay | Admitting: Obstetrics & Gynecology

## 2013-08-18 VITALS — BP 132/84 | HR 71 | Temp 98.2°F

## 2013-08-18 DIAGNOSIS — Z3202 Encounter for pregnancy test, result negative: Secondary | ICD-10-CM

## 2013-08-18 DIAGNOSIS — Z30430 Encounter for insertion of intrauterine contraceptive device: Secondary | ICD-10-CM

## 2013-08-18 DIAGNOSIS — Z01818 Encounter for other preprocedural examination: Secondary | ICD-10-CM

## 2013-08-18 LAB — POCT URINE PREGNANCY: Preg Test, Ur: NEGATIVE

## 2013-08-18 NOTE — Progress Notes (Signed)
IUD Insertion Procedure Note  Pre-operative Diagnosis: Requests LARC  Post-operative Diagnosis: same  Indications: contraception  Procedure Details  Urine pregnancy test was done and result was negative.  The risks (including infection, bleeding, pain, and uterine perforation) and benefits of the procedure were explained to the patient and Written informed consent was obtained.    Cervix cleansed with Betadine. Uterus sounded to 9 cm. IUD inserted without difficulty. String visible and trimmed. Patient tolerated procedure well.  IUD Information: Mirena.  Condition: Stable  Complications: None  Plan:  The patient was advised to call for any fever or for prolonged or severe pain or bleeding. She was advised to use OTC ibuprofen as needed for mild to moderate pain.

## 2013-08-19 ENCOUNTER — Encounter: Payer: Self-pay | Admitting: Obstetrics & Gynecology

## 2013-08-22 NOTE — Patient Instructions (Signed)
Intrauterine Device Insertion, Care After  Refer to this sheet in the next few weeks. These instructions provide you with information on caring for yourself after your procedure. Your health care provider may also give you more specific instructions. Your treatment has been planned according to current medical practices, but problems sometimes occur. Call your health care provider if you have any problems or questions after your procedure.  WHAT TO EXPECT AFTER THE PROCEDURE  Insertion of the IUD may cause some discomfort, such as cramping. The cramping should improve after the IUD is in place. You may have bleeding after the procedure. This is normal. It varies from light spotting for a few days to menstrual-like bleeding. When the IUD is in place, a string will extend past the cervix into the vagina for 1 2 inches. The strings should not bother you or your partner. If they do, talk to your health care provider.   HOME CARE INSTRUCTIONS    Check your intrauterine device (IUD) to make sure it is in place before you resume sexual activity. You should be able to feel the strings. If you cannot feel the strings, something may be wrong. The IUD may have fallen out of the uterus, or the uterus may have been punctured (perforated) during placement. Also, if the strings are getting longer, it may mean that the IUD is being forced out of the uterus. You no longer have full protection from pregnancy if any of these problems occur.   You may resume sexual intercourse if you are not having problems with the IUD. The copper IUD is considered immediately effective, and the hormone IUD works right away if inserted within 7 days of your period starting. You will need to use a backup method of birth control for 7 days if the IUD in inserted at any other time in your cycle.   Continue to check that the IUD is still in place by feeling for the strings after every menstrual period.   You may need to take pain medicine such as  acetaminophen or ibuprofen. Only take medicines as directed by your health care provider.  SEEK MEDICAL CARE IF:    You have bleeding that is heavier or lasts longer than a normal menstrual cycle.   You have a fever.   You have increasing cramps or abdominal pain not relieved with medicine.   You have abdominal pain that does not seem to be related to the same area of earlier cramping and pain.   You are lightheaded, unusually weak, or faint.   You have abnormal vaginal discharge or smells.   You have pain during sexual intercourse.   You cannot feel the IUD strings, or the IUD string has gotten longer.   You feel the IUD at the opening of the cervix in the vagina.   You think you are pregnant, or you miss your menstrual period.   The IUD string is hurting your sex partner.  MAKE SURE YOU:   Understand these instructions.   Will watch your condition.   Will get help right away if you are not doing well or get worse.  Document Released: 11/21/2010 Document Revised: 01/13/2013 Document Reviewed: 09/13/2012  [employer] Patient Information 2014 [employer].

## 2013-08-26 ENCOUNTER — Ambulatory Visit: Payer: Medicaid Other | Admitting: Obstetrics & Gynecology

## 2013-09-07 ENCOUNTER — Telehealth: Payer: Self-pay | Admitting: *Deleted

## 2013-09-07 NOTE — Telephone Encounter (Signed)
Patient c/o pain with IUD and bleeding with intercourse. Pain not going away as expected and patient is worried about the position.Advised she probably needs appointment. Call forwarded to brenda to schedule .

## 2013-09-09 ENCOUNTER — Encounter: Payer: Self-pay | Admitting: Obstetrics & Gynecology

## 2013-09-09 ENCOUNTER — Ambulatory Visit (INDEPENDENT_AMBULATORY_CARE_PROVIDER_SITE_OTHER): Payer: Medicaid Other | Admitting: Obstetrics & Gynecology

## 2013-09-09 VITALS — BP 139/94 | HR 86 | Temp 97.9°F | Ht 63.0 in | Wt 281.0 lb

## 2013-09-09 DIAGNOSIS — Z30431 Encounter for routine checking of intrauterine contraceptive device: Secondary | ICD-10-CM

## 2013-09-09 DIAGNOSIS — R102 Pelvic and perineal pain: Secondary | ICD-10-CM

## 2013-09-09 MED ORDER — DOXYCYCLINE HYCLATE 50 MG PO CAPS: 100.0000 mg | ORAL_CAPSULE | Freq: Two times a day (BID) | ORAL | Status: AC

## 2013-09-11 LAB — GC/CHLAMYDIA PROBE AMP
CT PROBE, AMP APTIMA: NEGATIVE
GC Probe RNA: NEGATIVE

## 2013-09-14 NOTE — Progress Notes (Signed)
Patient ID: Crystal Burns, female   DOB: March 19, 1981, 33 y.o.   MRN: [id number]  Chief Complaint  Patient presents with  . Problem    IUD, cramping    HPI HELEANA Burns is a 33 y.o. female.  Reports cramping since insertion.  HPI  Past Medical History  Diagnosis Date  . Hypertension   . Asthma   . Gestational diabetes     2012  . Depression   . History of gonorrhea     Past Surgical History  Procedure Laterality Date  . Dilation and curettage of uterus    . Cholecystectomy    . Wisdom tooth extraction      Family History  Problem Relation Age of Onset  . Hypertension Mother   . Diabetes Mother   . Cancer Mother   . Hypertension Father   . Diabetes Father   . Hyperlipidemia Father   . Arthritis    . Asthma    . Breast cancer    . Heart failure    . Congenital heart disease    . Depression    . Heart attack      Social History History  Substance Use Topics  . Smoking status: Former Smoker    Types: Cigarettes    Quit date: 10/31/2010  . Smokeless tobacco: Never Used  . Alcohol Use: Yes     Comment: Socially    Allergies  Allergen Reactions  . Shellfish Allergy Anaphylaxis  . Bee Venom Swelling    Current Outpatient Prescriptions  Medication Sig Dispense Refill  . NIFEdipine (PROCARDIA XL) 30 MG 24 hr tablet Take 1 tablet (30 mg total) by mouth daily.  30 tablet  3  . Prenat w/o A-FeCbGl-DSS-FA-DHA (CITRANATAL ASSURE) 300 MG MISC Take 1 capsule by mouth daily.  30 each  11  . doxycycline (VIBRAMYCIN) 50 MG capsule Take 2 capsules (100 mg total) by mouth 2 (two) times daily.  40 capsule  0   No current facility-administered medications for this visit.    Review of Systems Review of Systems Constitutional: negative for fatigue and weight loss Respiratory: negative for cough and wheezing Cardiovascular: negative for chest pain, fatigue and palpitations Gastrointestinal: negative for abdominal pain and change in bowel  habits Genitourinary: positive for cramping Integument/breast: negative for nipple discharge Musculoskeletal:negative for myalgias Neurological: negative for gait problems and tremors Behavioral/Psych: negative for abusive relationship, depression Endocrine: negative for temperature intolerance     Blood pressure 139/94, pulse 86, temperature 97.9 F (36.6 C), height 5\' 3"  (1.6 m), weight 127.461 kg (281 lb), last menstrual period 08/06/2013, unknown if currently breastfeeding.  Physical Exam Physical Exam General:   alert  Skin:   no rash or abnormalities  Lungs:   clear to auscultation bilaterally  Heart:   regular rate and rhythm, S1, S2 normal, no murmur, click, rub or gallop  Abdomen:  normal findings: no organomegaly, soft, non-tender and no hernia  Pelvis:  External genitalia: normal general appearance Urinary system: urethral meatus normal and bladder without fullness, nontender Vaginal: normal without tenderness, induration or masses Cervix: normal appearance; IUD strings present Adnexa: normal bimanual exam Uterus: anteverted and non-tender, normal size    3-D coronal imaging/U/S: IUD in an appropriate position; no myometrial penetration; left ovary normal; right ovary not seen   Data Reviewed None  Assessment    IUD w/cramping after insertion Offered removal     Plan    Orders Placed This Encounter  Procedures  . GC/Chlamydia Probe Amp  .  US Transvaginal Non-OB    Standing Status: Future     Number of Occurrences:      Standing Expiration Date: 11/10/2014    Order Specific Question:  Reason for Exam (SYMPTOM  OR DIAGNOSIS REQUIRED)    Answer:  pelvic pain    Order Specific Question:  Preferred imaging location?    Answer[facility]  . US Pelvis Complete    Standing Status: Future     Number of Occurrences:      Standing Expiration Date: 11/10/2014    Order Specific Question:  Reason for Exam (SYMPTOM  OR DIAGNOSIS REQUIRED)    Answer:  pelvic pain     Order Specific Question:  Preferred imaging location?    Answer:  [facility]  . US 3-Dimensional Imaging    Order Specific Question:  Reason for Exam (SYMPTOM  OR DIAGNOSIS REQUIRED)    Answer:  IUD placement    Order Specific Question:  Preferred imaging location?    Answer:  Internal   Meds ordered this encounter  Medications  . doxycycline (VIBRAMYCIN) 50 MG capsule    Sig: Take 2 capsules (100 mg total) by mouth 2 (two) times daily.    Dispense:  40 capsule    Refill:  0    Follow up as needed or after the U/S         Crystal Burns 09/14/2013, 6:43 PM

## 2013-09-16 ENCOUNTER — Ambulatory Visit (HOSPITAL_COMMUNITY): Admission: RE | Admit: 2013-09-16 | Payer: Medicaid Other | Source: Ambulatory Visit

## 2013-09-20 ENCOUNTER — Telehealth: Payer: Self-pay | Admitting: *Deleted

## 2013-09-20 NOTE — Telephone Encounter (Signed)
Patient missed her appointment for her [facility] at [name]. Patient states she is still bleeding and cramping. Appointment rescheduled, will ask Dr Tamela OddiJackson Moore about her pain and bleeding.

## 2013-09-21 ENCOUNTER — Ambulatory Visit (HOSPITAL_COMMUNITY)
Admission: RE | Admit: 2013-09-21 | Discharge: 2013-09-21 | Disposition: A | Discharge status: Home / Self Care | Payer: Medicaid Other | Source: Ambulatory Visit | Attending: Obstetrics & Gynecology | Admitting: Obstetrics & Gynecology

## 2013-09-21 DIAGNOSIS — N949 Unspecified condition associated with female genital organs and menstrual cycle: Secondary | ICD-10-CM | POA: Insufficient documentation

## 2013-09-21 DIAGNOSIS — N898 Other specified noninflammatory disorders of vagina: Secondary | ICD-10-CM | POA: Insufficient documentation

## 2013-09-21 DIAGNOSIS — Z30431 Encounter for routine checking of intrauterine contraceptive device: Secondary | ICD-10-CM | POA: Insufficient documentation

## 2013-09-21 DIAGNOSIS — R102 Pelvic and perineal pain: Secondary | ICD-10-CM

## 2013-09-22 ENCOUNTER — Ambulatory Visit: Payer: Medicaid Other | Admitting: Obstetrics & Gynecology

## 2013-09-27 ENCOUNTER — Telehealth: Payer: Self-pay | Admitting: *Deleted

## 2013-09-27 NOTE — Telephone Encounter (Signed)
Patient called stating she had her IUD placed one month ago, Patient states she has been bleeding since then. Patient states she has had an ultrasound and would like to know if she can just have the IUD removed.   IUD appropriate per U/S on 6/16.

## 2013-11-03 NOTE — Telephone Encounter (Signed)
Followed up with patient. Patient states she is doing better with the IUD, still having some spotting and cramping here and there. Patient advised that is normal. Patient notified that if she ever had any issues with it that she could come in and we could remove it or that if she was ever worried about the placement she could come in and we could check the placement for her. Patient voiced understanding.

## 2013-12-09 ENCOUNTER — Telehealth: Payer: Self-pay | Admitting: *Deleted

## 2013-12-09 NOTE — Telephone Encounter (Signed)
Patient states she is requesting a sooner appointment because she is having increasing problems with her IUD. Patient states her partner can feel the IUD and she has cramping. 2:36 Call to patient -cell number- left message paitent could come today. Patient did not come to office. Will forward call to see if we can schedule her next Friday.

## 2013-12-16 ENCOUNTER — Encounter: Payer: Self-pay | Admitting: Obstetrics & Gynecology

## 2013-12-16 ENCOUNTER — Ambulatory Visit (INDEPENDENT_AMBULATORY_CARE_PROVIDER_SITE_OTHER): Payer: Medicaid Other | Admitting: Obstetrics & Gynecology

## 2013-12-16 ENCOUNTER — Other Ambulatory Visit: Payer: Self-pay | Admitting: Obstetrics & Gynecology

## 2013-12-16 VITALS — Temp 97.4°F | Ht 63.0 in | Wt 285.0 lb

## 2013-12-16 DIAGNOSIS — Z30011 Encounter for initial prescription of contraceptive pills: Secondary | ICD-10-CM

## 2013-12-16 DIAGNOSIS — Z30432 Encounter for removal of intrauterine contraceptive device: Secondary | ICD-10-CM

## 2013-12-16 DIAGNOSIS — T8384XA Pain from genitourinary prosthetic devices, implants and grafts, initial encounter: Secondary | ICD-10-CM

## 2013-12-16 DIAGNOSIS — Z3202 Encounter for pregnancy test, result negative: Secondary | ICD-10-CM

## 2013-12-16 DIAGNOSIS — G8918 Other acute postprocedural pain: Secondary | ICD-10-CM

## 2013-12-16 LAB — OB RESULTS CONSOLE GC/CHLAMYDIA
CHLAMYDIA, DNA PROBE: NEGATIVE
GC PROBE AMP, GENITAL: NEGATIVE

## 2013-12-16 LAB — POCT URINE PREGNANCY: PREG TEST UR: NEGATIVE

## 2013-12-16 MED ORDER — LEVONORGESTREL-ETHINYL ESTRAD 0.15-30 MG-MCG PO TABS: 1.0000 | ORAL_TABLET | Freq: Every day | ORAL | Status: DC

## 2013-12-16 NOTE — Progress Notes (Signed)
Patient ID: Crystal Burns, female   DOB: [birth year], 33 y.o.   MRN: [id number]  Chief Complaint  Patient presents with  . IUD Check    HPI Crystal Burns is a 33 y.o. female.  C/O cramping/abnormal uterine bleeding x several months.  HPI  Past Medical History  Diagnosis Date  . Hypertension   . Asthma   . Gestational diabetes     2012  . Depression   . History of gonorrhea     Past Surgical History  Procedure Laterality Date  . Dilation and curettage of uterus    . Cholecystectomy    . Wisdom tooth extraction      Family History  Problem Relation Age of Onset  . Hypertension Mother   . Diabetes Mother   . Cancer Mother   . Hypertension Father   . Diabetes Father   . Hyperlipidemia Father   . Arthritis    . Asthma    . Breast cancer    . Heart failure    . Congenital heart disease    . Depression    . Heart attack      Social History History  Substance Use Topics  . Smoking status: Former Smoker    Types: Cigarettes    Quit date: 10/31/2010  . Smokeless tobacco: Never Used  . Alcohol Use: Yes     Comment: Socially    Allergies  Allergen Reactions  . Shellfish Allergy Anaphylaxis  . Bee Venom Swelling    Current Outpatient Prescriptions  Medication Sig Dispense Refill  . NIFEdipine (PROCARDIA XL) 30 MG 24 hr tablet Take 1 tablet (30 mg total) by mouth daily.  30 tablet  3  . levonorgestrel-ethinyl estradiol (NORDETTE) 0.15-30 MG-MCG tablet Take 1 tablet by mouth daily.  1 Package  11   No current facility-administered medications for this visit.    Review of Systems Review of Systems Constitutional: negative for fatigue and weight loss Respiratory: negative for cough and wheezing Cardiovascular: negative for chest pain, fatigue and palpitations Gastrointestinal: negative for abdominal pain and change in bowel habits Genitourinary:positive cramping, abnormal uterine bleeding Integument/breast: negative for nipple  discharge Musculoskeletal:negative for myalgias Neurological: negative for gait problems and tremors Behavioral/Psych: negative for abusive relationship, depression Endocrine: negative for temperature intolerance     Temperature 97.4 F (36.3 C), height  (1.6 m), weight 129.275 kg (285 lb), unknown if currently breastfeeding.  Physical Exam Physical Exam General:   alert  Skin:   no rash or abnormalities  Lungs:   clear to auscultation bilaterally  Heart:   regular rate and rhythm, S1, S2 normal, no murmur, click, rub or gallop  Abdomen:  normal findings: no organomegaly, soft, non-tender and no hernia  Pelvis:  External genitalia: normal general appearance Urinary system: urethral meatus normal and bladder without fullness, nontender Vaginal: normal without tenderness, induration or masses Cervix: normal appearance; IUD strings seen Adnexa: normal bimanual exam Uterus: anteverted and non-tender, normal size    U/S w/ 3-D imaging-->IUD in an appropriate position  As per the pt's request, the IUD was removed intact  Data Reviewed UPT  Assessment    ?Etiology of complaints AUB likely unscheduled bleed with the Mirena IUD    Plan    Orders Placed This Encounter  Procedures  . WET PREP BY MOLECULAR PROBE  . GC/Chlamydia Probe Amp  . US Transvaginal Non-OB    Standing Status: Future     Number of Occurrences:      Standing  Expiration Date: 02/16/2015    Order Specific Question:  Reason for Exam (SYMPTOM  OR DIAGNOSIS REQUIRED)    Answer:  IUD Placement    Order Specific Question:  Preferred imaging location?    Answer:  Internal  . POCT urine pregnancy   Meds ordered this encounter  Medications  . levonorgestrel-ethinyl estradiol (NORDETTE) 0.15-30 MG-MCG tablet    Sig: Take 1 tablet by mouth daily.    Dispense:  1 Package    Refill:  11    Possible management options include: keep menstrual/symptom diary, NSAIDS prn Follow up in a few months or prn          JACKSON-MOORE,Araly Kaas A 12/19/2013, 10:35 AM

## 2013-12-17 LAB — GC/CHLAMYDIA PROBE AMP
CT Probe RNA: NEGATIVE
GC Probe RNA: NEGATIVE

## 2013-12-21 ENCOUNTER — Other Ambulatory Visit: Payer: Medicaid Other

## 2013-12-22 ENCOUNTER — Other Ambulatory Visit: Payer: Medicaid Other

## 2013-12-30 ENCOUNTER — Ambulatory Visit: Payer: Medicaid Other | Admitting: Obstetrics & Gynecology

## 2014-01-12 ENCOUNTER — Other Ambulatory Visit (INDEPENDENT_AMBULATORY_CARE_PROVIDER_SITE_OTHER): Payer: Medicaid Other

## 2014-01-12 DIAGNOSIS — T8384XA Pain from genitourinary prosthetic devices, implants and grafts, initial encounter: Secondary | ICD-10-CM

## 2014-01-31 ENCOUNTER — Emergency Department (INDEPENDENT_AMBULATORY_CARE_PROVIDER_SITE_OTHER)
Admission: EM | Admit: 2014-01-31 | Discharge: 2014-01-31 | Disposition: A | Discharge status: Home / Self Care | Payer: Medicaid Other | Source: Home / Self Care | Attending: Family Medicine | Admitting: Family Medicine

## 2014-01-31 ENCOUNTER — Encounter (HOSPITAL_COMMUNITY): Payer: Self-pay | Admitting: Emergency Medicine

## 2014-01-31 DIAGNOSIS — J020 Streptococcal pharyngitis: Secondary | ICD-10-CM

## 2014-01-31 LAB — POCT URINALYSIS DIP (DEVICE)
Bilirubin Urine: NEGATIVE
Glucose, UA: NEGATIVE mg/dL
Hgb urine dipstick: NEGATIVE
Ketones, ur: NEGATIVE mg/dL
Nitrite: NEGATIVE
PH: 6.5 (ref 5.0–8.0)
PROTEIN: NEGATIVE mg/dL
Specific Gravity, Urine: 1.015 (ref 1.005–1.030)
Urobilinogen, UA: 0.2 mg/dL (ref 0.0–1.0)

## 2014-01-31 LAB — POCT PREGNANCY, URINE: Preg Test, Ur: NEGATIVE

## 2014-01-31 LAB — POCT RAPID STREP A: STREPTOCOCCUS, GROUP A SCREEN (DIRECT): POSITIVE — AB

## 2014-01-31 MED ORDER — AMOXICILLIN 500 MG PO CAPS: 500.0000 mg | ORAL_CAPSULE | Freq: Three times a day (TID) | ORAL | Status: DC

## 2014-01-31 NOTE — Discharge Instructions (Signed)
Drink lots of fluids, take all of medicine, use lozenges as needed.return if needed °

## 2014-01-31 NOTE — ED Provider Notes (Signed)
CSN: [id number]     Arrival date & time 01/31/14  1538 History   First MD Initiated Contact with Patient 01/31/14 1603     Chief Complaint  Patient presents with  . Sore Throat   (Consider location/radiation/quality/duration/timing/severity/associated sxs/prior Treatment) Patient is a 33 y.o. female presenting with pharyngitis. The history is provided by the patient.  Sore Throat This is a new problem. The current episode started yesterday. The problem has been gradually worsening. The symptoms are aggravated by swallowing.    Past Medical History  Diagnosis Date  . Hypertension   . Asthma   . Gestational diabetes     2012  . Depression   . History of gonorrhea    Past Surgical History  Procedure Laterality Date  . Dilation and curettage of uterus    . Cholecystectomy    . Wisdom tooth extraction     Family History  Problem Relation Age of Onset  . Hypertension Mother   . Diabetes Mother   . Cancer Mother   . Hypertension Father   . Diabetes Father   . Hyperlipidemia Father   . Arthritis    . Asthma    . Breast cancer    . Heart failure    . Congenital heart disease    . Depression    . Heart attack     History  Substance Use Topics  . Smoking status: Former Smoker    Types: Cigarettes    Quit date: 10/31/2010  . Smokeless tobacco: Never Used  . Alcohol Use: Yes     Comment: Socially   OB History   Grav Para Term Preterm Abortions TAB SAB Ect Mult Living   10 6 6  3  3   5      Review of Systems  Constitutional: Positive for fever, chills and appetite change.  HENT: Positive for sore throat.   Respiratory: Negative.   Gastrointestinal: Negative.   Skin: Negative for rash.    Allergies  Shellfish allergy and Bee venom  Home Medications   Prior to Admission medications   Medication Sig Start Date End Date Taking? Authorizing Provider  amoxicillin (AMOXIL) 500 MG capsule Take 1 capsule (500 mg total) by mouth 3 (three) times daily. 01/31/14    Linna HoffJames D Kindl, MD  levonorgestrel-ethinyl estradiol (NORDETTE) 0.15-30 MG-MCG tablet Take 1 tablet by mouth daily. 12/16/13   Antionette CharLisa Jackson-Moore, MD  NIFEdipine (PROCARDIA XL) 30 MG 24 hr tablet Take 1 tablet (30 mg total) by mouth daily. 07/27/12   Antionette CharLisa Jackson-Moore, MD   BP 122/74  Pulse 86  Temp(Src) 98.8 F (37.1 C) (Oral)  Resp 18  SpO2 98% Physical Exam  Nursing note and vitals reviewed. Constitutional: She is oriented to person, place, and time. She appears well-developed and well-nourished.  HENT:  Right Ear: External ear normal.  Mouth/Throat: Uvula is midline and mucous membranes are normal. Oropharyngeal exudate and posterior oropharyngeal erythema present.  Eyes: Conjunctivae are normal. Pupils are equal, round, and reactive to light.  Neck: Normal range of motion. Neck supple.  Cardiovascular: Normal heart sounds.   Pulmonary/Chest: Effort normal and breath sounds normal.  Lymphadenopathy:    She has cervical adenopathy.  Neurological: She is alert and oriented to person, place, and time.  Skin: Skin is warm and dry.    ED Course  Procedures (including critical care time) Labs Review Labs Reviewed  POCT URINALYSIS DIP (DEVICE) - Abnormal; Notable for the following:    Leukocytes, UA TRACE (*)  All other components within normal limits  POCT RAPID STREP A ([facility] CARE ONLY) - Abnormal; Notable for the following:    Streptococcus, Group A Screen (Direct) POSITIVE (*)    All other components within normal limits  POCT PREGNANCY, URINE    Imaging Review No results found.   MDM   1. Streptococcal sore throat       Linna HoffJames D Kindl, MD 01/31/14 [phone]

## 2014-01-31 NOTE — ED Notes (Signed)
Pt  Has        sorethroat      r  Earache                With  Fever          Since  yest            Pt  Reports  She  Has  Been taking  otc    Motrin    And  otc    Cold  meds  For same      Pt sitting  Upright on  The  Exam table  Speaking in complete  sentances      Appearing   In no  Acute  Distress

## 2014-02-07 ENCOUNTER — Encounter (HOSPITAL_COMMUNITY): Payer: Self-pay | Admitting: Emergency Medicine

## 2014-02-14 ENCOUNTER — Ambulatory Visit: Payer: Medicaid Other | Admitting: Obstetrics & Gynecology

## 2014-02-15 ENCOUNTER — Encounter (HOSPITAL_COMMUNITY): Payer: Self-pay | Admitting: Emergency Medicine

## 2014-02-15 ENCOUNTER — Emergency Department (INDEPENDENT_AMBULATORY_CARE_PROVIDER_SITE_OTHER)
Admission: EM | Admit: 2014-02-15 | Discharge: 2014-02-15 | Disposition: A | Discharge status: Home / Self Care | Payer: Medicaid Other | Source: Home / Self Care | Attending: Family Medicine | Admitting: Family Medicine

## 2014-02-15 DIAGNOSIS — M79604 Pain in right leg: Secondary | ICD-10-CM

## 2014-02-15 DIAGNOSIS — M79601 Pain in right arm: Secondary | ICD-10-CM

## 2014-02-15 MED ORDER — IBUPROFEN 800 MG PO TABS: 800.0000 mg | ORAL_TABLET | Freq: Three times a day (TID) | ORAL | Status: DC | PRN

## 2014-02-15 NOTE — ED Provider Notes (Signed)
CSN: [id number]     Arrival date & time 02/15/14  1857 History   First MD Initiated Contact with Patient 02/15/14 1958     Chief Complaint  Patient presents with  . 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 This is a second job. Also works at [employer] standing all day. Does not wear supportive footwear. Since starting new job, R lower leg has been sore from knee to ankle. Usually goes away with rest but has not resolved in last 2 days and pt feels is getting more severe.   Patient is a 33 y.o. female presenting with leg pain. The history is provided by the patient. No language interpreter was used.  Leg Pain Location:  Leg Time since incident:  2 days Injury: no   Leg location:  R lower leg Pain details:    Quality:  Aching   Radiates to:  Does not radiate   Severity:  Moderate   Onset quality:  Gradual   Duration:  2 days   Timing:  Constant   Progression:  Unchanged Chronicity:  New Prior injury to area:  No Relieved by:  Nothing Worsened by:  Bearing weight Ineffective treatments:  Rest and NSAIDs Associated symptoms: no fever, no muscle weakness, no numbness and no swelling     Past Medical History  Diagnosis Date  . Hypertension   . Asthma   . Gestational diabetes     2012  . Depression   . History of gonorrhea    Past Surgical History  Procedure Laterality Date  . Dilation and curettage of uterus    . Cholecystectomy    . Wisdom tooth extraction     Family History  Problem Relation Age of Onset  . Hypertension Mother   . Diabetes Mother   . Cancer Mother   . Hypertension Father   . Diabetes Father   . Hyperlipidemia Father   . Arthritis    . Asthma    . Breast cancer    . Heart failure    . Congenital heart disease    . Depression    . Heart attack     History   Substance Use Topics  . Smoking status: Former Smoker    Types: Cigarettes    Quit date: 10/31/2010  . Smokeless tobacco: Never Used  . Alcohol Use: Yes     Comment: Socially   OB History    Gravida Para Term Preterm AB TAB SAB Ectopic Multiple Living   10 6 6  3  3   5      Review of Systems  Constitutional: Negative for fever and chills.  Musculoskeletal: Negative for joint swelling.       R lower leg pain  Skin: Negative for color change.  Neurological: Negative for weakness and numbness.    Allergies  Shellfish allergy and Bee venom  Home Medications   Prior to Admission medications   Medication Sig Start Date End Date Taking? Authorizing Provider  ibuprofen (ADVIL,MOTRIN) 800 MG tablet Take 1 tablet (800 mg total) by mouth every 8 (eight) hours as needed. 02/15/14   Cathlyn ParsonsAngela M Gerardine Peltz, NP  levonorgestrel-ethinyl estradiol (NORDETTE) 0.15-30 MG-MCG tablet Take 1 tablet by mouth daily. 12/16/13   Antionette CharLisa Jackson-Moore, MD  NIFEdipine (PROCARDIA XL) 30 MG 24 hr tablet Take 1 tablet (30 mg total) by mouth daily. 07/27/12   Antionette CharLisa Jackson-Moore,  MD   BP 123/83 mmHg  Pulse 82  Temp(Src) 97.8 F (36.6 C) (Oral)  SpO2 98%  LMP 01/31/2014 Physical Exam  Constitutional: She appears well-developed and well-nourished. No distress.  Pt is morbidly obese. BMI 50.   Musculoskeletal:       Right lower leg: She exhibits tenderness. She exhibits no bony tenderness, no swelling and no deformity.  No erythema, warmth or edema of R calf. Negative Homan's sign. Pt is wearing unsupportive shoes. Does not have "knot" behind R knee as reported to RN but there is a roll of fat behind both knees. No tenderness to palpation of B knees.   Neurological:  Gait is normal except limping, favoring RLE.   Skin: Skin is warm, dry and intact. No erythema.    ED Course  Procedures (including critical care time) Labs Review Labs Reviewed - No data to display  Imaging Review No results found.   MDM    1. 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 Rx ibuprofen 800mg  TID prn #21.      Cathlyn ParsonsAngela M Krystle Polcyn, NP 02/15/14 2031

## 2014-02-15 NOTE — ED Notes (Signed)
C/o right calf pain.  Patient reports she has had a knot behind right knee.  Patient noted pain for 2 weeks.  Pin into calf of leg.  Patient has been working 2 jobs for 3 weeks.

## 2014-02-15 NOTE — Discharge Instructions (Signed)
Consider getting more supportive footwear, consider wearing support hose or support socks. Consider a different kind of job if you are able.    Musculoskeletal Pain Musculoskeletal pain is muscle and boney aches and pains. These pains can occur in any part of the body. Your caregiver may treat you without knowing the cause of the pain. They may treat you if blood or urine tests, X-rays, and other tests were normal.  CAUSES There is often not a definite cause or reason for these pains. These pains may be caused by a type of germ (virus). The discomfort may also come from overuse. Overuse includes working out too hard when your body is not fit. Boney aches also come from weather changes. Bone is sensitive to atmospheric pressure changes. HOME CARE INSTRUCTIONS   Ask when your test results will be ready. Make sure you get your test results.  Only take over-the-counter or prescription medicines for pain, discomfort, or fever as directed by your caregiver. If you were given medications for your condition, do not drive, operate machinery or power tools, or sign legal documents for 24 hours. Do not drink alcohol. Do not take sleeping pills or other medications that may interfere with treatment.  Continue all activities unless the activities cause more pain. When the pain lessens, slowly resume normal activities. Gradually increase the intensity and duration of the activities or exercise.  During periods of severe pain, bed rest may be helpful. Lay or sit in any position that is comfortable.  Putting ice on the injured area.  Put ice in a bag.  Place a towel between your skin and the bag.  Leave the ice on for 15 to 20 minutes, 3 to 4 times a day.  Follow up with your caregiver for continued problems and no reason can be found for the pain. If the pain becomes worse or does not go away, it may be necessary to repeat tests or do additional testing. Your caregiver may need to look further for a  possible cause. SEEK IMMEDIATE MEDICAL CARE IF:  You have pain that is getting worse and is not relieved by medications.  You develop chest pain that is associated with shortness or breath, sweating, feeling sick to your stomach (nauseous), or throw up (vomit).  Your pain becomes localized to the abdomen.  You develop any new symptoms that seem different or that concern you. MAKE SURE YOU:   Understand these instructions.  Will watch your condition.  Will get help right away if you are not doing well or get worse. Document Released: 03/25/2005 Document Revised: 06/17/2011 Document Reviewed: 11/27/2012 [facility] Patient Information 2015 [employer] [place]. This information is not intended to replace advice given to you by your health care provider. Make sure you discuss any questions you have with your health care provider.

## 2014-03-08 ENCOUNTER — Encounter (HOSPITAL_COMMUNITY): Payer: Self-pay | Admitting: Emergency Medicine

## 2014-03-08 ENCOUNTER — Emergency Department (HOSPITAL_COMMUNITY)
Admission: EM | Admit: 2014-03-08 | Discharge: 2014-03-08 | Disposition: A | Discharge status: Home / Self Care | Payer: Medicaid Other | Attending: Emergency Medicine | Admitting: Emergency Medicine

## 2014-03-08 ENCOUNTER — Emergency Department (HOSPITAL_COMMUNITY): Payer: Medicaid Other

## 2014-03-08 DIAGNOSIS — Z79899 Other long term (current) drug therapy: Secondary | ICD-10-CM | POA: Insufficient documentation

## 2014-03-08 DIAGNOSIS — M25561 Pain in right knee: Secondary | ICD-10-CM | POA: Insufficient documentation

## 2014-03-08 DIAGNOSIS — Z8619 Personal history of other infectious and parasitic diseases: Secondary | ICD-10-CM | POA: Insufficient documentation

## 2014-03-08 DIAGNOSIS — Z87891 Personal history of nicotine dependence: Secondary | ICD-10-CM | POA: Diagnosis not present

## 2014-03-08 DIAGNOSIS — I10 Essential (primary) hypertension: Secondary | ICD-10-CM | POA: Diagnosis not present

## 2014-03-08 DIAGNOSIS — J45909 Unspecified asthma, uncomplicated: Secondary | ICD-10-CM | POA: Insufficient documentation

## 2014-03-08 DIAGNOSIS — R609 Edema, unspecified: Secondary | ICD-10-CM

## 2014-03-08 DIAGNOSIS — Z8659 Personal history of other mental and behavioral disorders: Secondary | ICD-10-CM | POA: Diagnosis not present

## 2014-03-08 DIAGNOSIS — R52 Pain, unspecified: Secondary | ICD-10-CM

## 2014-03-08 MED ORDER — HYDROCODONE-ACETAMINOPHEN 5-325 MG PO TABS: 1.0000 | ORAL_TABLET | Freq: Once | ORAL | Status: DC

## 2014-03-08 MED ORDER — HYDROCODONE-ACETAMINOPHEN 5-325 MG PO TABS
1.0000 | ORAL_TABLET | Freq: Once | ORAL | Status: DC
  Filled 2014-03-08: qty 1

## 2014-03-08 MED ORDER — IBUPROFEN 600 MG PO TABS: 600.0000 mg | ORAL_TABLET | Freq: Four times a day (QID) | ORAL | Status: DC | PRN

## 2014-03-08 MED ORDER — HYDROCODONE-ACETAMINOPHEN 5-325 MG PO TABS: 2.0000 | ORAL_TABLET | ORAL | Status: DC | PRN

## 2014-03-08 NOTE — Discharge Instructions (Signed)
Please follow up with your primary care physician in 1-2 days. If you do not have one please call the [facility] number listed above. Please follow up with Dr. Eulah PontMurphy to schedule a follow up appointment.  Please follow RICE method below. Please take pain medication and/or muscle relaxants as prescribed and as needed for pain. Please do not drive on narcotic pain medication or on muscle relaxants. Please read all discharge instructions and return precautions.   Knee Pain The knee is the complex joint between your thigh and your lower leg. It is made up of bones, tendons, ligaments, and cartilage. The bones that make up the knee are:  The femur in the thigh.  The tibia and fibula in the lower leg.  The patella or kneecap riding in the groove on the lower femur. CAUSES  Knee pain is a common complaint with many causes. A few of these causes are:  Injury, such as:  A ruptured ligament or tendon injury.  Torn cartilage.  Medical conditions, such as:  Gout  Arthritis  Infections  Overuse, over training, or overdoing a physical activity. Knee pain can be minor or severe. Knee pain can accompany debilitating injury. Minor knee problems often respond well to self-care measures or get well on their own. More serious injuries may need medical intervention or even surgery. SYMPTOMS The knee is complex. Symptoms of knee problems can vary widely. Some of the problems are:  Pain with movement and weight bearing.  Swelling and tenderness.  Buckling of the knee.  Inability to straighten or extend your knee.  Your knee locks and you cannot straighten it.  Warmth and redness with pain and fever.  Deformity or dislocation of the kneecap. DIAGNOSIS  Determining what is wrong may be very straight forward such as when there is an injury. It can also be challenging because of the complexity of the knee. Tests to make a diagnosis may include:  Your caregiver taking a  history and doing a physical exam.  Routine X-rays can be used to rule out other problems. X-rays will not reveal a cartilage tear. Some injuries of the knee can be diagnosed by:  Arthroscopy a surgical technique by which a small video camera is inserted through tiny incisions on the sides of the knee. This procedure is used to examine and repair internal knee joint problems. Tiny instruments can be used during arthroscopy to repair the torn knee cartilage (meniscus).  Arthrography is a radiology technique. A contrast liquid is directly injected into the knee joint. Internal structures of the knee joint then become visible on X-ray film.  An MRI scan is a non X-ray radiology procedure in which magnetic fields and a computer produce two- or three-dimensional images of the inside of the knee. Cartilage tears are often visible using an MRI scanner. MRI scans have largely replaced arthrography in diagnosing cartilage tears of the knee.  Blood work.  Examination of the fluid that helps to lubricate the knee joint (synovial fluid). This is done by taking a sample out using a needle and a syringe. TREATMENT The treatment of knee problems depends on the cause. Some of these treatments are:  Depending on the injury, proper casting, splinting, surgery, or physical therapy care will be needed.  Give yourself adequate recovery time. Do not overuse your joints. If you begin to get sore during workout routines, back off. Slow down or do fewer repetitions.  For repetitive activities such as cycling or running, maintain your  strength and nutrition.  Alternate muscle groups. For example, if you are a weight lifter, work the upper body on one day and the lower body the next.  Either tight or weak muscles do not give the proper support for your knee. Tight or weak muscles do not absorb the stress placed on the knee joint. Keep the muscles surrounding the knee strong.  Take care of mechanical problems.  If  you have flat feet, orthotics or special shoes may help. See your caregiver if you need help.  Arch supports, sometimes with wedges on the inner or outer aspect of the heel, can help. These can shift pressure away from the side of the knee most bothered by osteoarthritis.  A brace called an "unloader" brace also may be used to help ease the pressure on the most arthritic side of the knee.  If your caregiver has prescribed crutches, braces, wraps or ice, use as directed. The acronym for this is PRICE. This means protection, rest, ice, compression, and elevation.  Nonsteroidal anti-inflammatory drugs (NSAIDs), can help relieve pain. But if taken immediately after an injury, they may actually increase swelling. Take NSAIDs with food in your stomach. Stop them if you develop stomach problems. Do not take these if you have a history of ulcers, stomach pain, or bleeding from the bowel. Do not take without your caregiver's approval if you have problems with fluid retention, heart failure, or kidney problems.  For ongoing knee problems, physical therapy may be helpful.  Glucosamine and chondroitin are over-the-counter dietary supplements. Both may help relieve the pain of osteoarthritis in the knee. These medicines are different from the usual anti-inflammatory drugs. Glucosamine may decrease the rate of cartilage destruction.  Injections of a corticosteroid drug into your knee joint may help reduce the symptoms of an arthritis flare-up. They may provide pain relief that lasts a few months. You may have to wait a few months between injections. The injections do have a small increased risk of infection, water retention, and elevated blood sugar levels.  Hyaluronic acid injected into damaged joints may ease pain and provide lubrication. These injections may work by reducing inflammation. A series of shots may give relief for as long as 6 months.  Topical painkillers. Applying certain ointments to your skin  may help relieve the pain and stiffness of osteoarthritis. Ask your pharmacist for suggestions. Many over the-counter products are approved for temporary relief of arthritis pain.  In some countries, doctors often prescribe topical NSAIDs for relief of chronic conditions such as arthritis and tendinitis. A review of treatment with NSAID creams found that they worked as well as oral medications but without the serious side effects. PREVENTION  Maintain a healthy weight. Extra pounds put more strain on your joints.  Get strong, stay limber. Weak muscles are a common cause of knee injuries. Stretching is important. Include flexibility exercises in your workouts.  Be smart about exercise. If you have osteoarthritis, chronic knee pain or recurring injuries, you may need to change the way you exercise. This does not mean you have to stop being active. If your knees ache after jogging or playing basketball, consider switching to swimming, water aerobics, or other low-impact activities, at least for a few days a week. Sometimes limiting high-impact activities will provide relief.  Make sure your shoes fit well. Choose footwear that is right for your sport.  Protect your knees. Use the proper gear for knee-sensitive activities. Use kneepads when playing volleyball or laying carpet. Buckle your seat  belt every time you drive. Most shattered kneecaps occur in car accidents.  Rest when you are tired. SEEK MEDICAL CARE IF:  You have knee pain that is continual and does not seem to be getting better.  SEEK IMMEDIATE MEDICAL CARE IF:  Your knee joint feels hot to the touch and you have a high fever. MAKE SURE YOU:   Understand these instructions.  Will watch your condition.  Will get help right away if you are not doing well or get worse. Document Released: 01/20/2007 Document Revised: 06/17/2011 Document Reviewed: 01/20/2007 [facility] Patient Information 2015 [place], [place]. This information is not  intended to replace advice given to you by your health care provider. Make sure you discuss any questions you have with your health care provider.  RICE: Routine Care for Injuries The routine care of many injuries includes Rest, Ice, Compression, and Elevation (RICE). HOME CARE INSTRUCTIONS  Rest is needed to allow your body to heal. Routine activities can usually be resumed when comfortable. Injured tendons and bones can take up to 6 weeks to heal. Tendons are the cord-like structures that attach muscle to bone.  Ice following an injury helps keep the swelling down and reduces pain.  Put ice in a plastic bag.  Place a towel between your skin and the bag.  Leave the ice on for 15-20 minutes, 3-4 times a day, or as directed by your health care provider. Do this while awake, for the first 24 to 48 hours. After that, continue as directed by your caregiver.  Compression helps keep swelling down. It also gives support and helps with discomfort. If an elastic bandage has been applied, it should be removed and reapplied every 3 to 4 hours. It should not be applied tightly, but firmly enough to keep swelling down. Watch fingers or toes for swelling, bluish discoloration, coldness, numbness, or excessive pain. If any of these problems occur, remove the bandage and reapply loosely. Contact your caregiver if these problems continue.  Elevation helps reduce swelling and decreases pain. With extremities, such as the arms, hands, legs, and feet, the injured area should be placed near or above the level of the heart, if possible. SEEK IMMEDIATE MEDICAL CARE IF:  You have persistent pain and swelling.  You develop redness, numbness, or unexpected weakness.  Your symptoms are getting worse rather than improving after several days. These symptoms may indicate that further evaluation or further X-rays are needed. Sometimes, X-rays may not show a small broken bone (fracture) until 1 week or 10 days later. Make  a follow-up appointment with your caregiver. Ask when your X-ray results will be ready. Make sure you get your X-ray results. Document Released: 07/07/2000 Document Revised: 03/30/2013 Document Reviewed: 08/24/2010 [facility] Patient Information 2015 [place], [place]. This information is not intended to replace advice given to you by your health care provider. Make sure you discuss any questions you have with your health care provider. Knee Effusion The medical term for having fluid in your knee is effusion. This is often due to an internal derangement of the knee. This means something is wrong inside the knee. Some of the causes of fluid in the knee may be torn cartilage, a torn ligament, or bleeding into the joint from an injury. Your knee is likely more difficult to bend and move. This is often because there is increased pain and pressure in the joint. The time it takes for recovery from a knee effusion depends on different factors, including:   Type of  injury.  Your age.  Physical and medical conditions.  Rehabilitation Strategies. How long you will be away from your normal activities will depend on what kind of knee problem you have and how much damage is present. Your knee has two types of cartilage. Articular cartilage covers the bone ends and lets your knee bend and move smoothly. Two menisci, thick pads of cartilage that form a rim inside the joint, help absorb shock and stabilize your knee. Ligaments bind the bones together and support your knee joint. Muscles move the joint, help support your knee, and take stress off the joint itself. CAUSES  Often an effusion in the knee is caused by an injury to one of the menisci. This is often a tear in the cartilage. Recovery after a meniscus injury depends on how much meniscus is damaged and whether you have damaged other knee tissue. Small tears may heal on their own with conservative treatment. Conservative means rest, limited weight bearing activity  and muscle strengthening exercises. Your recovery may take up to 6 weeks.  TREATMENT  Larger tears may require surgery. Meniscus injuries may be treated during arthroscopy. Arthroscopy is a procedure in which your surgeon uses a small telescope like instrument to look in your knee. Your caregiver can make a more accurate diagnosis (learning what is wrong) by performing an arthroscopic procedure. If your injury is on the inner margin of the meniscus, your surgeon may trim the meniscus back to a smooth rim. In other cases your surgeon will try to repair a damaged meniscus with stitches (sutures). This may make rehabilitation take longer, but may provide better long term result by helping your knee keep its shock absorption capabilities. Ligaments which are completely torn usually require surgery for repair. HOME CARE INSTRUCTIONS  Use crutches as instructed.  If a brace is applied, use as directed.  Once you are home, an ice pack applied to your swollen knee may help with discomfort and help decrease swelling.  Keep your knee raised (elevated) when you are not up and around or on crutches.  Only take over-the-counter or prescription medicines for pain, discomfort, or fever as directed by your caregiver.  Your caregivers will help with instructions for rehabilitation of your knee. This often includes strengthening exercises.  You may resume a normal diet and activities as directed. SEEK MEDICAL CARE IF:   There is increased swelling in your knee.  You notice redness, swelling, or increasing pain in your knee.  An unexplained oral temperature above 102 F (38.9 C) develops. SEEK IMMEDIATE MEDICAL CARE IF:   You develop a rash.  You have difficulty breathing.  You have any allergic reactions from medications you may have been given.  There is severe pain with any motion of the knee. MAKE SURE YOU:   Understand these instructions.  Will watch your condition.  Will get help right  away if you are not doing well or get worse. Document Released: 06/15/2003 Document Revised: 06/17/2011 Document Reviewed: 08/19/2007 [facility] Patient Information 2015 [place], [place]. This information is not intended to replace advice given to you by your health care provider. Make sure you discuss any questions you have with your health care provider.

## 2014-03-08 NOTE — ED Notes (Signed)
Pt. reports right knee pain / swelling onset 2 weeks ago , denies injury or fall / ambulatory , pt. stated long hours of standing./walking / heavy work load on her job .

## 2014-03-08 NOTE — ED Provider Notes (Signed)
CSN: [id number]     Arrival date & time 03/08/14  2011 History  This chart was scribed for non-physician practitioner, Francee PiccoloJennifer Phuong Moffatt, PA-C working with Gerhard Munchobert Lockwood, MD by Greggory StallionKayla Andersen, ED scribe. This patient was seen in room TR07C/TR07C and the patient's care was started at 10:21 PM.   Chief Complaint  Patient presents with  . Knee Pain   The history is provided by the patient. No language interpreter was used.    HPI Comments: Crystal Burns is a 33 y.o. female who presents to the Emergency Department complaining of right knee pain with associated swelling that started 2 weeks ago. Denies fall or injury but states she recently started a second job and has to stand and walk around a lot. She has taken 800 mg ibuprofen and used cold compresses with some relief. Denies fever. Pt has not had any recent injections into her knee. Denies past injury to knee. Denies history of IV drug use.  Past Medical History  Diagnosis Date  . Hypertension   . Asthma   . Gestational diabetes     2012  . Depression   . History of gonorrhea    Past Surgical History  Procedure Laterality Date  . Dilation and curettage of uterus    . Cholecystectomy    . Wisdom tooth extraction     Family History  Problem Relation Age of Onset  . Hypertension Mother   . Diabetes Mother   . Cancer Mother   . Hypertension Father   . Diabetes Father   . Hyperlipidemia Father   . Arthritis    . Asthma    . Breast cancer    . Heart failure    . Congenital heart disease    . Depression    . Heart attack     History  Substance Use Topics  . Smoking status: Former Smoker    Types: Cigarettes    Quit date: 10/31/2010  . Smokeless tobacco: Never Used  . Alcohol Use: Yes     Comment: Socially   OB History    Gravida Para Term Preterm AB TAB SAB Ectopic Multiple Living   10 6 6  3  3   5      Review of Systems  Constitutional: Negative for fever.  Musculoskeletal: Positive for joint swelling  and arthralgias.  All other systems reviewed and are negative.  Allergies  Shellfish allergy and Bee venom  Home Medications   Prior to Admission medications   Medication Sig Start Date End Date Taking? Authorizing Provider  HYDROcodone-acetaminophen (NORCO/VICODIN) 5-325 MG per tablet Take 2 tablets by mouth every 4 (four) hours as needed. 03/08/14   Jeralynn Vaquera L Tahmir Kleckner, PA-C  ibuprofen (ADVIL,MOTRIN) 600 MG tablet Take 1 tablet (600 mg total) by mouth every 6 (six) hours as needed. 03/08/14   Jaselyn Nahm L Claudene Gatliff, PA-C  ibuprofen (ADVIL,MOTRIN) 800 MG tablet Take 1 tablet (800 mg total) by mouth every 8 (eight) hours as needed. 02/15/14   Cathlyn ParsonsAngela M Kabbe, NP  levonorgestrel-ethinyl estradiol (NORDETTE) 0.15-30 MG-MCG tablet Take 1 tablet by mouth daily. 12/16/13   Antionette CharLisa Jackson-Moore, MD  NIFEdipine (PROCARDIA XL) 30 MG 24 hr tablet Take 1 tablet (30 mg total) by mouth daily. 07/27/12   Antionette CharLisa Jackson-Moore, MD   BP 152/95 mmHg  Pulse 75  Temp(Src) 98.4 F (36.9 C) (Oral)  Resp 16  Ht 5\' 4"  (1.626 m)  Wt 279 lb (126.554 kg)  BMI 47.87 kg/m2  SpO2 100%  LMP 03/03/2014  Physical Exam  Constitutional: She is oriented to person, place, and time. She appears well-developed and well-nourished.  Non-toxic appearance. No distress.  Examination limited by body habitus.   HENT:  Head: Normocephalic and atraumatic.  Right Ear: External ear normal.  Left Ear: External ear normal.  Nose: Nose normal.  Mouth/Throat: Oropharynx is clear and moist.  Eyes: Conjunctivae are normal.  Neck: Normal range of motion. Neck supple.  Cardiovascular: Normal rate, regular rhythm, normal heart sounds and intact distal pulses.   Pulmonary/Chest: Effort normal.  Abdominal: Soft. There is no tenderness.  Musculoskeletal: Normal range of motion. She exhibits no edema.       Right knee: She exhibits normal range of motion, no swelling, no effusion, no ecchymosis, no deformity, no laceration, no erythema,  normal alignment, no LCL laxity, normal patellar mobility and no bony tenderness. Tenderness found.       Left knee: Normal.       Right lower leg: Normal.       Left lower leg: Normal.       Legs: Neurological: She is alert and oriented to person, place, and time.  Skin: Skin is warm and dry. She is not diaphoretic. No erythema.  Psychiatric: She has a normal mood and affect.  Nursing note and vitals reviewed.   ED Course  Procedures (including critical care time)  DIAGNOSTIC STUDIES: Oxygen Saturation is 100% on RA, normal by my interpretation.    COORDINATION OF CARE: 10:25 PM-Discussed treatment plan which includes knee sleeve, ice and pain management with pt at bedside and pt agreed to plan. Will give pt an orthopedic referral and advised her to follow up.   Labs Review Labs Reviewed - No data to display  Imaging Review Dg Knee Complete 4 Views Right  03/08/2014   CLINICAL DATA:  33 year old female with 2 week history of right knee pain and swelling  EXAM: RIGHT KNEE - COMPLETE 4+ VIEW  COMPARISON:  None.  FINDINGS: No evidence of acute fracture or malalignment. Suspect suprapatellar knee joint effusion. Mild degenerative change with narrowing of the medial joint space and early osteophyte formation. Normal bony mineralization. No lytic or blastic osseous lesion.  IMPRESSION: 1. Suprapatellar knee joint effusion. Differential considerations include inflammatory common degenerative than infectious etiologies. 2. 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   Electronically Signed   By: Malachy MoanHeath  McCullough M.D.   On: 03/08/2014 22:03     EKG Interpretation None      MDM   Final diagnoses:  Right knee pain    Filed Vitals:   03/08/14 2240  BP: 152/95  Pulse: 75  Temp: 98.4 F (36.9 C)  Resp: 16   Afebrile, NAD, non-toxic appearing, AAOx4.  Neurovascularly intact. Normal sensation. No evidence of compartment syndrome. Patient  with R knee pain x 3 weeks. No obvious swelling to the knee appreciated. ROM intact without pain. No erythema or warmth to suggest infectious process. Patient prefers to attempt conservative measures and will follow up with orthopedics for persistent symptoms. Return precautions discussed. Patient is agreeable to plan.  Patient is stable at time of discharge   I personally performed the services described in this documentation, which was scribed in my presence. The recorded information has been reviewed and is accurate.  Jeannetta EllisJennifer L Lyall Faciane, PA-C 03/08/14 2250  Gerhard Munchobert Lockwood, MD 03/09/14 [phone]

## 2014-04-04 ENCOUNTER — Encounter: Payer: Self-pay | Admitting: *Deleted

## 2014-04-05 ENCOUNTER — Encounter: Payer: Self-pay | Admitting: Obstetrics & Gynecology

## 2014-04-08 NOTE — L&D Delivery Note (Signed)
Delivery Note At 5:51 PM a viable female was delivered via Vaginal, Spontaneous Delivery (Presentation: ; Occiput Anterior).  APGAR: 8, 9; weight: 3290 grams .   Placenta status: Intact, Spontaneous.  Cord: 3 vessels with the following complications: None.  Cord pH: none  Anesthesia: None  Episiotomy: None Lacerations: None Suture Repair: none Est. Blood Loss (mL): 350  Mom to postpartum.  Baby to Couplet care / Skin to Skin.  Narely Nobles A 01/27/2015, 6:09 PM

## 2014-04-22 ENCOUNTER — Ambulatory Visit: Payer: Medicaid Other | Attending: Family Medicine | Admitting: Family Medicine

## 2014-04-22 ENCOUNTER — Encounter: Payer: Self-pay | Admitting: Family Medicine

## 2014-04-22 VITALS — BP 148/86 | HR 97 | Temp 98.7°F | Resp 16 | Ht 63.0 in | Wt 284.0 lb

## 2014-04-22 DIAGNOSIS — Z9114 Patient's other noncompliance with medication regimen: Secondary | ICD-10-CM | POA: Insufficient documentation

## 2014-04-22 DIAGNOSIS — M25561 Pain in right knee: Secondary | ICD-10-CM | POA: Diagnosis present

## 2014-04-22 DIAGNOSIS — M179 Osteoarthritis of knee, unspecified: Secondary | ICD-10-CM | POA: Diagnosis not present

## 2014-04-22 DIAGNOSIS — Z6841 Body Mass Index (BMI) 40.0 and over, adult: Secondary | ICD-10-CM | POA: Insufficient documentation

## 2014-04-22 DIAGNOSIS — I10 Essential (primary) hypertension: Secondary | ICD-10-CM | POA: Diagnosis not present

## 2014-04-22 DIAGNOSIS — Z87891 Personal history of nicotine dependence: Secondary | ICD-10-CM | POA: Insufficient documentation

## 2014-04-22 DIAGNOSIS — M1711 Unilateral primary osteoarthritis, right knee: Secondary | ICD-10-CM

## 2014-04-22 LAB — CBC
HEMATOCRIT: 35.7 % — AB (ref 36.0–46.0)
Hemoglobin: 11.7 g/dL — ABNORMAL LOW (ref 12.0–15.0)
MCH: 25.5 pg — ABNORMAL LOW (ref 26.0–34.0)
MCHC: 32.8 g/dL (ref 30.0–36.0)
MCV: 77.9 fL — AB (ref 78.0–100.0)
MPV: 9.5 fL (ref 8.6–12.4)
Platelets: 349 10*3/uL (ref 150–400)
RBC: 4.58 MIL/uL (ref 3.87–5.11)
RDW: 15.1 % (ref 11.5–15.5)
WBC: 7.5 10*3/uL (ref 4.0–10.5)

## 2014-04-22 LAB — COMPLETE METABOLIC PANEL WITH GFR
ALT: 20 U/L (ref 0–35)
AST: 15 U/L (ref 0–37)
Albumin: 3.7 g/dL (ref 3.5–5.2)
Alkaline Phosphatase: 71 U/L (ref 39–117)
BILIRUBIN TOTAL: 0.3 mg/dL (ref 0.2–1.2)
BUN: 10 mg/dL (ref 6–23)
CHLORIDE: 102 meq/L (ref 96–112)
CO2: 28 mEq/L (ref 19–32)
Calcium: 10 mg/dL (ref 8.4–10.5)
Creat: 0.59 mg/dL (ref 0.50–1.10)
GFR, Est African American: >89 mL/min
GFR, Est Non African American: >89 mL/min
Glucose, Bld: 83 mg/dL (ref 70–99)
POTASSIUM: 5.1 meq/L (ref 3.5–5.3)
SODIUM: 136 meq/L (ref 135–145)
Total Protein: 6.8 g/dL (ref 6.0–8.3)

## 2014-04-22 MED ORDER — METHYLPREDNISOLONE ACETATE 40 MG/ML IJ SUSP
40.0000 mg | Freq: Once | INTRAMUSCULAR | Status: AC
  Administered 2014-04-22: 40 mg via INTRA_ARTICULAR

## 2014-04-22 MED ORDER — HYDROCHLOROTHIAZIDE 25 MG PO TABS: 25.0000 mg | ORAL_TABLET | Freq: Every day | ORAL | Status: DC

## 2014-04-22 NOTE — Assessment & Plan Note (Addendum)
1. R knee OA You have received a shot of steroid in your joint today. Rest and ice knee today. Regular activity tomorrow. Look out for redness, swelling, fever,severe pain in joint and call if you experience these symptoms.  Return to work Monday without restrictions.  Work on weight loss. Your plan to cut out pepsi is a great one. Replace Pepsi with caffeine tea or decaf coffee, unsweetened. Choose healthy snack, I recommend whole foods like vegetable and fruits.  Be as physically active as possible.  You may take scheduled tylenol [phone] mg every 8 hrs.

## 2014-04-22 NOTE — Assessment & Plan Note (Signed)
2. HTN: Change to HCTZ 25 mg daily Stop nifedipine

## 2014-04-22 NOTE — Assessment & Plan Note (Signed)
Work on weight loss. Your plan to cut out pepsi is a great one. Replace Pepsi with caffeine tea or decaf coffee, unsweetened. Choose healthy snack, I recommend whole foods like vegetable and fruits.  Be as physically active as possible.

## 2014-04-22 NOTE — Progress Notes (Signed)
Patient here to establish care Takes procardia-noncompliant due to side effects Right knee pain 7/10 aching, constant, shooting pain Knee "pops" according to patient Pain shoots into calf Edema every day according to patient Works 2 jobs on her feet works 55+ week Patient needs note for work on her restrictions if any Lost her mother in 2010 and son to SIDS in 2005 Pap within three years -normal

## 2014-04-22 NOTE — Patient Instructions (Addendum)
Crystal Burns,   Thank you for coming in today. It was a pleasure meeting you. I look forward to being your primary doctor.  1. R knee OA You have received a shot of steroid in your joint today. Rest and ice knee today. Regular activity tomorrow. Look out for redness, swelling, fever,severe pain in joint and call if you experience these symptoms.  Return to work Monday without restrictions.  Work on weight loss. Your plan to cut out pepsi is a great one. Replace Pepsi with caffeine tea or decaf coffee, unsweetened. Choose healthy snack, I recommend whole foods like vegetable and fruits.  Be as physically active as possible.  You may take scheduled tylenol [phone] mg every 8 hrs.   2. HTN: Change to HCTZ 25 mg daily Stop nifedipine   F/u with me in 6 weeks  For R knee OA and HTN  Dr. Armen Pickup   Osteoarthritis Osteoarthritis is a disease that causes soreness and inflammation of a joint. It occurs when the cartilage at the affected joint wears down. Cartilage acts as a cushion, covering the ends of bones where they meet to form a joint. Osteoarthritis is the most common form of arthritis. It often occurs in older people. The joints affected most often by this condition include those in the:  Ends of the fingers.  Thumbs.  Neck.  Lower back.  Knees.  Hips. CAUSES  Over time, the cartilage that covers the ends of bones begins to wear away. This causes bone to rub on bone, producing pain and stiffness in the affected joints.  RISK FACTORS Certain factors can increase your chances of having osteoarthritis, including:  Older age.  Excessive body weight.  Overuse of joints.  Previous joint injury. SIGNS AND SYMPTOMS   Pain, swelling, and stiffness in the joint.  Over time, the joint may lose its normal shape.  Small deposits of bone (osteophytes) may grow on the edges of the joint.  Bits of bone or cartilage can break off and float inside the joint space. This may  cause more pain and damage. DIAGNOSIS  Your health care provider will do a physical exam and ask about your symptoms. Various tests may be ordered, such as:  X-rays of the affected joint.  An MRI scan.  Blood tests to rule out other types of arthritis.  Joint fluid tests. This involves using a needle to draw fluid from the joint and examining the fluid under a microscope. TREATMENT  Goals of treatment are to control pain and improve joint function. Treatment plans may include:  A prescribed exercise program that allows for rest and joint relief.  A weight control plan.  Pain relief techniques, such as:  Properly applied heat and cold.  Electric pulses delivered to nerve endings under the skin (transcutaneous electrical nerve stimulation [TENS]).  Massage.  Certain nutritional supplements.  Medicines to control pain, such as:  Acetaminophen.  Nonsteroidal anti-inflammatory drugs (NSAIDs), such as naproxen.  Narcotic or central-acting agents, such as tramadol.  Corticosteroids. These can be given orally or as an injection.  Surgery to reposition the bones and relieve pain (osteotomy) or to remove loose pieces of bone and cartilage. Joint replacement may be needed in advanced states of osteoarthritis. HOME CARE INSTRUCTIONS   Take medicines only as directed by your health care provider.  Maintain a healthy weight. Follow your health care provider's instructions for weight control. This may include dietary instructions.  Exercise as directed. Your health care provider can recommend specific types  of exercise. These may include:  Strengthening exercises. These are done to strengthen the muscles that support joints affected by arthritis. They can be performed with weights or with exercise bands to add resistance.  Aerobic activities. These are exercises, such as brisk walking or low-impact aerobics, that get your heart pumping.  Range-of-motion activities. These keep your  joints limber.  Balance and agility exercises. These help you maintain daily living skills.  Rest your affected joints as directed by your health care provider.  Keep all follow-up visits as directed by your health care provider. SEEK MEDICAL CARE IF:   Your skin turns red.  You develop a rash in addition to your joint pain.  You have worsening joint pain.  You have a fever along with joint or muscle aches. SEEK IMMEDIATE MEDICAL CARE IF:  You have a significant loss of weight or appetite.  You have night sweats. FOR MORE INFORMATION   National Institute of Arthritis and Musculoskeletal and Skin Diseases: www.niams.http://www.myers.net/nih.gov  [employer]ging: https://walker.com/www.nia.nih.gov  American College of Rheumatology: www.rheumatology.org Document Released: 03/25/2005 Document Revised: 08/09/2013 Document Reviewed: 11/30/2012 [facility] Patient Information 2015 [place], [place]. This information is not intended to replace advice given to you by your health care provider. Make sure you discuss any questions you have with your health care provider.

## 2014-04-22 NOTE — Progress Notes (Signed)
   Subjective:    Patient ID: Crystal Burns, female    DOB: 1980/06/14, 34 y.o.   MRN: [id number] CC: R knee pain  HPI 34 yo F establish care:  1. R knee pain: x 2 months. No injury. Pain and swelling R lateral knee, suprapatellar, behind the knee, upper lateral calf. No erythema. Pain is exacerbated by prolonged standing, walking up stairs. Patient has gained 30 # in the past 3 years. Admit to poor diet of Pepsi and calorie snacks. Ibuprofen and tylenol do not help pain.    2. HTN: non compliant with nefedipine because she feels like it worsens swelling.   Soc Hx: former smoker, quit in 2012  Med Hx:  HTN since 2004  Surg Hx: s/p chole in  Review of Systems As per HPI     Objective:   Physical Exam BP 148/86 mmHg  Pulse 97  Temp(Src) 98.7 F (37.1 C)  Resp 16  Ht 5\' 3"  (1.6 m)  Wt 284 lb (128.822 kg)  BMI 50.32 kg/m2  SpO2 100%  LMP 04/02/2014 General appearance: alert, cooperative, no distress and morbidly obese  Knee: Obese, mild suprapatellar effusion.  No erythema or obvious bony abnormalities. Palpation normal with no warmth or joint line tenderness or patellar tenderness or condyle tenderness. ROM normal in flexion and extension and lower leg rotation. Ligaments with solid consistent endpoints including ACL, PCL, LCL, MCL. Non painful patellar compression. Patellar and quadriceps tendons unremarkable. Hamstring and quadriceps strength is normal.  After obtaining informed consent and cleaning the skin using iodine and alcohol a  steroid injection was performed at R knee using 1% plain Lidocaine and 40 mg of Depo Medrol. This was well tolerated      Assessment & Plan:

## 2014-04-25 ENCOUNTER — Telehealth: Payer: Self-pay | Admitting: *Deleted

## 2014-04-25 NOTE — Telephone Encounter (Signed)
-----   Message from Lora PaulaJosalyn C Funches, MD sent at 04/25/2014  2:35 PM EST ----- Normal CMP and CBC

## 2014-04-25 NOTE — Telephone Encounter (Signed)
Left voice message with normal labs If any question return call 

## 2014-06-01 ENCOUNTER — Encounter: Payer: Self-pay | Admitting: Obstetrics

## 2014-06-01 ENCOUNTER — Ambulatory Visit (INDEPENDENT_AMBULATORY_CARE_PROVIDER_SITE_OTHER): Payer: Medicaid Other | Admitting: Obstetrics

## 2014-06-01 ENCOUNTER — Encounter: Payer: Self-pay | Admitting: *Deleted

## 2014-06-01 ENCOUNTER — Other Ambulatory Visit: Payer: Medicaid Other

## 2014-06-01 VITALS — Wt 281.0 lb

## 2014-06-01 DIAGNOSIS — Z3201 Encounter for pregnancy test, result positive: Secondary | ICD-10-CM | POA: Diagnosis not present

## 2014-06-01 DIAGNOSIS — O99211 Obesity complicating pregnancy, first trimester: Secondary | ICD-10-CM

## 2014-06-01 DIAGNOSIS — O0991 Supervision of high risk pregnancy, unspecified, first trimester: Secondary | ICD-10-CM

## 2014-06-01 DIAGNOSIS — E669 Obesity, unspecified: Secondary | ICD-10-CM

## 2014-06-01 DIAGNOSIS — Z3200 Encounter for pregnancy test, result unknown: Secondary | ICD-10-CM

## 2014-06-01 DIAGNOSIS — O2690 Pregnancy related conditions, unspecified, unspecified trimester: Secondary | ICD-10-CM | POA: Diagnosis not present

## 2014-06-01 DIAGNOSIS — O161 Unspecified maternal hypertension, first trimester: Secondary | ICD-10-CM

## 2014-06-01 DIAGNOSIS — O09299 Supervision of pregnancy with other poor reproductive or obstetric history, unspecified trimester: Secondary | ICD-10-CM

## 2014-06-01 DIAGNOSIS — O09291 Supervision of pregnancy with other poor reproductive or obstetric history, first trimester: Secondary | ICD-10-CM

## 2014-06-01 LAB — POCT URINE PREGNANCY: PREG TEST UR: POSITIVE

## 2014-06-01 MED ORDER — OB COMPLETE PETITE 35-5-1-200 MG PO CAPS: 1.0000 | ORAL_CAPSULE | Freq: Every day | ORAL | Status: DC

## 2014-06-01 MED ORDER — LABETALOL HCL 300 MG PO TABS: 300.0000 mg | ORAL_TABLET | Freq: Two times a day (BID) | ORAL | Status: DC

## 2014-06-01 NOTE — Progress Notes (Signed)
Patient ID: Crystal Burns, female   DOB: July 10, 1980, 34 y.o.   MRN: [id number]  No chief complaint on file.   HPI Crystal AnoLaquetha T Simonetti is a 34 y.o. female.  Missed period.  Positive home UCG.  H/O multiple SAB's.  Hypertensive, on HCTZ.  H/O GDM.  HPI  Past Medical History  Diagnosis Date  . History of gonorrhea   . Asthma     2010  . Depression     2010  . Gestational diabetes     2012  . Hypertension     2004    Past Surgical History  Procedure Laterality Date  . Dilation and curettage of uterus    . Cholecystectomy    . Wisdom tooth extraction      Family History  Problem Relation Age of Onset  . Hypertension Mother   . Diabetes Mother   . Cancer Mother   . Hypertension Father   . Diabetes Father   . Hyperlipidemia Father   . Arthritis    . Asthma    . Breast cancer    . Heart failure    . Congenital heart disease    . Depression    . Heart attack      Social History History  Substance Use Topics  . Smoking status: Former Smoker    Types: Cigarettes    Quit date: 10/31/2010  . Smokeless tobacco: Never Used  . Alcohol Use: Yes     Comment: Socially    Allergies  Allergen Reactions  . Shellfish Allergy Anaphylaxis  . Bee Venom Swelling    Current Outpatient Prescriptions  Medication Sig Dispense Refill  . HYDROcodone-acetaminophen (NORCO/VICODIN) 5-325 MG per tablet Take 2 tablets by mouth every 4 (four) hours as needed. 10 tablet 0  . ibuprofen (ADVIL,MOTRIN) 600 MG tablet Take 1 tablet (600 mg total) by mouth every 6 (six) hours as needed. 30 tablet 0  . labetalol (NORMODYNE) 300 MG tablet Take 1 tablet (300 mg total) by mouth 2 (two) times daily. 60 tablet 11  . Prenat-FeCbn-FeAspGl-FA-Omega (OB COMPLETE PETITE) 35-5-1-200 MG CAPS Take 1 capsule by mouth daily before breakfast. 90 capsule 3   No current facility-administered medications for this visit.    Review of Systems Review of Systems Constitutional: negative for fatigue and  weight loss Respiratory: negative for cough and wheezing Cardiovascular: negative for chest pain, fatigue and palpitations Gastrointestinal: negative for abdominal pain and change in bowel habits Genitourinary:negative Integument/breast: negative for nipple discharge Musculoskeletal:negative for myalgias Neurological: negative for gait problems and tremors Behavioral/Psych: negative for abusive relationship, depression Endocrine: negative for temperature intolerance     Last menstrual period 04/29/2014, unknown if currently breastfeeding.  Physical Exam Physical Exam General:   alert  Skin:   no rash or abnormalities  Lungs:   clear to auscultation bilaterally  Heart:   regular rate and rhythm, S1, S2 normal, no murmur, click, rub or gallop  Breasts:   normal without suspicious masses, skin or nipple changes or axillary nodes  Abdomen:  normal findings: no organomegaly, soft, non-tender and no hernia  Pelvis:  External genitalia: normal general appearance Urinary system: urethral meatus normal and bladder without fullness, nontender Vaginal: normal without tenderness, induration or masses Cervix: normal appearance Adnexa: normal bimanual exam Uterus: anteverted and non-tender, normal size    100% of 10 min visit spent on counseling and coordination of care.   Data Reviewed Labs  Assessment     Early 1st trimester high risk pregnancy.  Plan    Stop HCTZ.  Labetalol Rx. Referred to MFM.  Orders Placed This Encounter  Procedures  . US OB Comp Less 14 Wks    Standing Status: Future     Number of Occurrences:      Standing Expiration Date: 07/31/2015    Order Specific Question:  Reason for Exam (SYMPTOM  OR DIAGNOSIS REQUIRED)    Answer:  Hypertension.  Obesity.  Poor OB History.    Order Specific Question:  Preferred imaging location?    Answer:  [facility]  . AMB Referral to Maternal Fetal Medicine (MFM)    Referral Priority:  Routine    Referral Type:   Consultation    Number of Visits Requested:  1   Meds ordered this encounter  Medications  . labetalol (NORMODYNE) 300 MG tablet    Sig: Take 1 tablet (300 mg total) by mouth 2 (two) times daily.    Dispense:  60 tablet    Refill:  11  . Prenat-FeCbn-FeAspGl-FA-Omega (OB COMPLETE PETITE) 35-5-1-200 MG CAPS    Sig: Take 1 capsule by mouth daily before breakfast.    Dispense:  90 capsule    Refill:  3

## 2014-06-12 ENCOUNTER — Encounter (HOSPITAL_COMMUNITY): Payer: Self-pay | Admitting: *Deleted

## 2014-06-12 ENCOUNTER — Inpatient Hospital Stay (HOSPITAL_COMMUNITY)
Admission: AD | Admit: 2014-06-12 | Discharge: 2014-06-12 | Disposition: A | Discharge status: Home / Self Care | Payer: Medicaid Other | Source: Ambulatory Visit | Attending: Obstetrics | Admitting: Obstetrics

## 2014-06-12 ENCOUNTER — Telehealth: Payer: Self-pay | Admitting: Nurse Practitioner

## 2014-06-12 DIAGNOSIS — O210 Mild hyperemesis gravidarum: Secondary | ICD-10-CM | POA: Diagnosis not present

## 2014-06-12 DIAGNOSIS — Z3A01 Less than 8 weeks gestation of pregnancy: Secondary | ICD-10-CM | POA: Insufficient documentation

## 2014-06-12 DIAGNOSIS — J069 Acute upper respiratory infection, unspecified: Secondary | ICD-10-CM | POA: Insufficient documentation

## 2014-06-12 DIAGNOSIS — Z3491 Encounter for supervision of normal pregnancy, unspecified, first trimester: Secondary | ICD-10-CM

## 2014-06-12 DIAGNOSIS — O9989 Other specified diseases and conditions complicating pregnancy, childbirth and the puerperium: Secondary | ICD-10-CM | POA: Diagnosis not present

## 2014-06-12 DIAGNOSIS — J029 Acute pharyngitis, unspecified: Secondary | ICD-10-CM | POA: Diagnosis present

## 2014-06-12 DIAGNOSIS — H9203 Otalgia, bilateral: Secondary | ICD-10-CM

## 2014-06-12 DIAGNOSIS — O219 Vomiting of pregnancy, unspecified: Secondary | ICD-10-CM

## 2014-06-12 LAB — URINALYSIS, ROUTINE W REFLEX MICROSCOPIC
BILIRUBIN URINE: NEGATIVE
Glucose, UA: NEGATIVE mg/dL
Hgb urine dipstick: NEGATIVE
Ketones, ur: NEGATIVE mg/dL
Leukocytes, UA: NEGATIVE
NITRITE: NEGATIVE
Protein, ur: NEGATIVE mg/dL
Specific Gravity, Urine: 1.025 (ref 1.005–1.030)
Urobilinogen, UA: 0.2 mg/dL (ref 0.0–1.0)
pH: 6 (ref 5.0–8.0)

## 2014-06-12 MED ORDER — PROMETHAZINE HCL 12.5 MG PO TABS: 12.5000 mg | ORAL_TABLET | Freq: Four times a day (QID) | ORAL | Status: DC | PRN

## 2014-06-12 NOTE — Discharge Instructions (Signed)
Morning Sickness Morning sickness is when you feel sick to your stomach (nauseous) during pregnancy. You may feel sick to your stomach and throw up (vomit). You may feel sick in the morning, but you can feel this way any time of day. Some women feel very sick to their stomach and cannot stop throwing up (hyperemesis gravidarum). HOME CARE  Only take medicines as told by your doctor.  Take multivitamins as told by your doctor. Taking multivitamins before getting pregnant can stop or lessen the harshness of morning sickness.  Eat dry toast or unsalted crackers before getting out of bed.  Eat 5 to 6 small meals a day.  Eat dry and bland foods like rice and baked potatoes.  Do not drink liquids with meals. Drink between meals.  Do not eat greasy, fatty, or spicy foods.  Have someone cook for you if the smell of food causes you to feel sick or throw up.  If you feel sick to your stomach after taking prenatal vitamins, take them at night or with a snack.  Eat protein when you need a snack (nuts, yogurt, cheese).  Eat unsweetened gelatins for dessert.  Wear a bracelet used for sea sickness (acupressure wristband).  Go to a doctor that puts thin needles into certain body points (acupuncture) to improve how you feel.  Do not smoke.  Use a humidifier to keep the air in your house free of odors.  Get lots of fresh air. GET HELP IF:  You need medicine to feel better.  You feel dizzy or lightheaded.  You are losing weight. GET HELP RIGHT AWAY IF:   You feel very sick to your stomach and cannot stop throwing up.  You pass out (faint). MAKE SURE YOU:  Understand these instructions.  Will watch your condition.  Will get help right away if you are not doing well or get worse. Document Released: 05/02/2004 Document Revised: 03/30/2013 Document Reviewed: 09/09/2012 [facility] Patient Information 2015 [place], [place]. This information is not intended to replace advice given to you by  your health care provider. Make sure you discuss any questions you have with your health care provider.   YOU MAY TAKE SUDAFED ONLY WHEN NEEDED> Mucinex and/or Robitussin as needed

## 2014-06-12 NOTE — MAU Provider Note (Signed)
History     CSN: [id number]  Arrival date and time: 06/12/14 1415   None     Chief Complaint  Patient presents with  . Sore Throat  . Cough  . Nausea  . Shortness of Breath  . Otalgia   HPI Crystal Burns is 34 y.o. [id number] [redacted]w[redacted]d weeks presenting with sore throat, ear pain, nasal congestion, and cough X 3 days.  Denies fever and chills but very achy.  Patient of Dr. Verdell Carmine.  She spoke to RN in office and was told to take Tylenol.  She came here today because she feels terrible.  She hasn't tried anything OTC for cold/congestion sxs.   She is also having nausea and vomiting associated with the pregnancy--has not worsened over the last 3 days with onset of respiratory sxs.  She would like med for nausea.    Past Medical History  Diagnosis Date  . History of gonorrhea   . Asthma     2010  . Depression     2010  . Gestational diabetes     2012  . Hypertension     2004    Past Surgical History  Procedure Laterality Date  . Dilation and curettage of uterus    . Cholecystectomy    . Wisdom tooth extraction      Family History  Problem Relation Age of Onset  . Hypertension Mother   . Diabetes Mother   . Cancer Mother   . Hypertension Father   . Diabetes Father   . Hyperlipidemia Father   . Arthritis    . Asthma    . Breast cancer    . Heart failure    . Congenital heart disease    . Depression    . Heart attack      History  Substance Use Topics  . Smoking status: Former Smoker    Types: Cigarettes    Quit date: 10/31/2010  . Smokeless tobacco: Never Used  . Alcohol Use: Yes     Comment: Socially    Allergies:  Allergies  Allergen Reactions  . Shellfish Allergy Anaphylaxis  . Bee Venom Swelling    Prescriptions prior to admission  Medication Sig Dispense Refill Last Dose  . HYDROcodone-acetaminophen (NORCO/VICODIN) 5-325 MG per tablet Take 2 tablets by mouth every 4 (four) hours as needed. 10 tablet 0 Taking  . ibuprofen (ADVIL,MOTRIN) 600  MG tablet Take 1 tablet (600 mg total) by mouth every 6 (six) hours as needed. 30 tablet 0 Taking  . labetalol (NORMODYNE) 300 MG tablet Take 1 tablet (300 mg total) by mouth 2 (two) times daily. 60 tablet 11   . Prenat-FeCbn-FeAspGl-FA-Omega (OB COMPLETE PETITE) 35-5-1-200 MG CAPS Take 1 capsule by mouth daily before breakfast. 90 capsule 3     Review of Systems  Constitutional: Negative for fever, chills and malaise/fatigue.  HENT: Positive for congestion and sore throat.   Respiratory: Positive for cough.   Cardiovascular: Negative for chest pain.  Gastrointestinal: Positive for nausea and vomiting. Negative for abdominal pain, diarrhea and constipation.  Genitourinary:       Neg for vaginal bleeding.  Musculoskeletal: Negative for back pain.  Neurological: Negative for headaches.   Physical Exam   Blood pressure 127/74, pulse 82, temperature 98.4 F (36.9 C), resp. rate 18, last menstrual period 04/29/2014, SpO2 100 %, unknown if currently breastfeeding.  Physical Exam  Vitals reviewed. Constitutional: She is oriented to person, place, and time. She appears well-developed and well-nourished. No distress.  HENT:  Head: Normocephalic.  Right Ear: No tenderness. Tympanic membrane is injected. Tympanic membrane is not erythematous, not retracted and not bulging.  Left Ear: No tenderness. Tympanic membrane is injected. Tympanic membrane is not erythematous, not retracted and not bulging.  Nose: Rhinorrhea present. Right sinus exhibits no maxillary sinus tenderness and no frontal sinus tenderness. Left sinus exhibits no maxillary sinus tenderness and no frontal sinus tenderness.  Mouth/Throat: No oropharyngeal exudate, posterior oropharyngeal edema or posterior oropharyngeal erythema.  Neck: Normal range of motion.  Cardiovascular: Normal rate, regular rhythm and normal heart sounds.   No murmur heard. Respiratory: Effort normal and breath sounds normal. No respiratory distress. She  has no wheezes. She has no rales. She exhibits no tenderness.  Genitourinary:  Pelvic deferred--neg for vaginal sxs  Neurological: She is alert and oriented to person, place, and time.  Skin: Skin is warm and dry.  Psychiatric: She has a normal mood and affect. Her behavior is normal.    Results for orders placed or performed during the hospital encounter of 06/12/14 (from the past 24 hour(s))  Urinalysis, Routine w reflex microscopic     Status: None   Collection Time: 06/12/14  2:45 PM  Result Value Ref Range   Color, Urine YELLOW YELLOW   APPearance CLEAR CLEAR   Specific Gravity, Urine 1.025 1.005 - 1.030   pH 6.0 5.0 - 8.0   Glucose, UA NEGATIVE NEGATIVE mg/dL   Hgb urine dipstick NEGATIVE NEGATIVE   Bilirubin Urine NEGATIVE NEGATIVE   Ketones, ur NEGATIVE NEGATIVE mg/dL   Protein, ur NEGATIVE NEGATIVE mg/dL   Urobilinogen, UA 0.2 0.0 - 1.0 mg/dL   Nitrite NEGATIVE NEGATIVE   Leukocytes, UA NEGATIVE NEGATIVE   MAU Course  Procedures  Influenza and strept testing pending.  MDM  Assessment and Plan  A:  Upper respiratory infection       Nasal congestion       Ear ache-bilaterally       Nausea and vomiting in first trimester pregnancy  P:  Suggested she take OTC Sudafed, Mucinex , Robitussin as needed       Rx for Phenergan prn for nausea       Flu and strept testing will be reported when resulted       Keep scheduled appt with Dr. Clearance CootsHarper       Call office for worsening sxs  Sanjay Broadfoot,EVE M 06/12/2014, 3:10 PM

## 2014-06-12 NOTE — Telephone Encounter (Signed)
Advised patient [facility]. She would be called with flu testing in morning Delbert PhenixLinda M Clemon Devaul, NP

## 2014-06-12 NOTE — MAU Note (Signed)
Pt presents to MAU with complaints of nausea, cough, sore throat, headache and earache  for three days. Denies any concerns with pregnancy

## 2014-06-13 LAB — INFLUENZA PANEL BY PCR (TYPE A & B)
H1N1FLUPCR: NOT DETECTED
Influenza A By PCR: NEGATIVE
Influenza B By PCR: NEGATIVE

## 2014-06-13 LAB — RAPID STREP SCREEN (MED CTR MEBANE ONLY): Streptococcus, Group A Screen (Direct): NEGATIVE

## 2014-06-15 LAB — CULTURE, GROUP A STREP: Strep A Culture: NEGATIVE

## 2014-06-20 ENCOUNTER — Telehealth: Payer: Self-pay | Admitting: *Deleted

## 2014-06-20 DIAGNOSIS — O219 Vomiting of pregnancy, unspecified: Secondary | ICD-10-CM

## 2014-06-20 MED ORDER — METOCLOPRAMIDE HCL 10 MG PO TABS: 10.0000 mg | ORAL_TABLET | Freq: Three times a day (TID) | ORAL | Status: DC

## 2014-06-20 NOTE — Addendum Note (Signed)
Addended by: Henriette CombsHATTON, ANDREA L on: 06/20/2014 05:16 PM   Modules accepted: Orders

## 2014-06-20 NOTE — Telephone Encounter (Signed)
Per Dr. Clearance CootsHarper: [facility] Knockkay to send a prescription for Reglan 10 mg po every 8 hours #30 with 1 refill.  Prescription sent to the pharmacy and left message to notify the patient.

## 2014-06-20 NOTE — Telephone Encounter (Signed)
Patient is calling about her medication- patient is wanting to change her nausea medication it is not working.

## 2014-06-29 ENCOUNTER — Other Ambulatory Visit: Payer: Self-pay | Admitting: Obstetrics

## 2014-06-29 ENCOUNTER — Other Ambulatory Visit (HOSPITAL_COMMUNITY): Payer: Medicaid Other

## 2014-06-29 ENCOUNTER — Ambulatory Visit (HOSPITAL_COMMUNITY)
Admission: RE | Admit: 2014-06-29 | Discharge: 2014-06-29 | Disposition: A | Discharge status: Home / Self Care | Payer: Medicaid Other | Source: Ambulatory Visit | Attending: Obstetrics | Admitting: Obstetrics

## 2014-06-29 DIAGNOSIS — O0991 Supervision of high risk pregnancy, unspecified, first trimester: Secondary | ICD-10-CM

## 2014-06-29 DIAGNOSIS — Z36 Encounter for antenatal screening of mother: Secondary | ICD-10-CM | POA: Insufficient documentation

## 2014-06-29 DIAGNOSIS — O09299 Supervision of pregnancy with other poor reproductive or obstetric history, unspecified trimester: Secondary | ICD-10-CM

## 2014-06-29 DIAGNOSIS — O161 Unspecified maternal hypertension, first trimester: Secondary | ICD-10-CM

## 2014-06-29 DIAGNOSIS — O99211 Obesity complicating pregnancy, first trimester: Secondary | ICD-10-CM

## 2014-06-30 ENCOUNTER — Ambulatory Visit (INDEPENDENT_AMBULATORY_CARE_PROVIDER_SITE_OTHER): Payer: Medicaid Other | Admitting: Obstetrics

## 2014-06-30 ENCOUNTER — Encounter: Payer: Self-pay | Admitting: Obstetrics

## 2014-06-30 VITALS — BP 114/77 | HR 77 | Temp 97.3°F | Wt 294.0 lb

## 2014-06-30 DIAGNOSIS — O0991 Supervision of high risk pregnancy, unspecified, first trimester: Secondary | ICD-10-CM | POA: Diagnosis not present

## 2014-06-30 LAB — POCT URINALYSIS DIPSTICK
BILIRUBIN UA: NEGATIVE
Glucose, UA: NEGATIVE
KETONES UA: NEGATIVE
LEUKOCYTES UA: NEGATIVE
Nitrite, UA: NEGATIVE
PH UA: 5
Protein, UA: NEGATIVE
RBC UA: NEGATIVE
SPEC GRAV UA: 1.015
Urobilinogen, UA: NEGATIVE

## 2014-06-30 NOTE — Progress Notes (Addendum)
  Subjective:    Crystal Burns is a 34 y.o. female being seen today for her obstetrical visit. She is at 6657w6d gestation. Patient reports: Nausea and vomiting.  Reglan and Phenergan ineffective  Problem List Items Addressed This Visit    None    Visit Diagnoses    Supervision of high risk pregnancy in first trimester    -  Primary    Relevant Orders    Obstetric panel    HIV antibody    Hemoglobinopathy evaluation    Varicella zoster antibody, IgG    Vit D  25 hydroxy (rtn osteoporosis monitoring)    Culture, OB Urine    POCT urinalysis dipstick      Patient Active Problem List   Diagnosis Date Noted  . Osteoarthritis of right knee 04/22/2014  . Essential hypertension 07/18/2012  . LUMBOSACRAL STRAIN, ACUTE 07/05/2008  . Morbid obesity 02/24/2008  . ASTHMA UNSPECIFIED WITH EXACERBATION 02/24/2008  . HYPERGLYCEMIA, BORDERLINE 02/24/2008    Objective:     BP 114/77 mmHg  Pulse 77  Temp(Src) 97.3 F (36.3 C)  Wt 294 lb (133.358 kg)  LMP 04/29/2014 Uterine Size: Below umbilicus     Assessment:    Pregnancy @ 6157w6d  weeks  Hyperemesis Gravidarum  Plan:   Dietary management of N/V stressed.   Problem list reviewed and updated. Labs reviewed.  Follow up in 4 weeks. FIRST/CF mutation testing/NIPT/QUAD SCREEN/fragile X/Ashkenazi Jewish population testing/Spinal muscular atrophy discussed: requested. Role of ultrasound in pregnancy discussed; fetal survey: requested. Amniocentesis discussed: not indicated.

## 2014-06-30 NOTE — Addendum Note (Signed)
Addended by: Henriette CombsHATTON, ANDREA L on: 06/30/2014 01:35 PM   Modules accepted: Orders

## 2014-07-01 LAB — OBSTETRIC PANEL
Antibody Screen: NEGATIVE
Basophils Absolute: 0 10*3/uL (ref 0.0–0.1)
Basophils Relative: 0 % (ref 0–1)
EOS PCT: 3 % (ref 0–5)
Eosinophils Absolute: 0.2 10*3/uL (ref 0.0–0.7)
HCT: 32.5 % — ABNORMAL LOW (ref 36.0–46.0)
Hemoglobin: 10.2 g/dL — ABNORMAL LOW (ref 12.0–15.0)
Hepatitis B Surface Ag: NEGATIVE
LYMPHS PCT: 25 % (ref 12–46)
Lymphs Abs: 2 10*3/uL (ref 0.7–4.0)
MCH: 25.3 pg — ABNORMAL LOW (ref 26.0–34.0)
MCHC: 31.4 g/dL (ref 30.0–36.0)
MCV: 80.6 fL (ref 78.0–100.0)
MPV: 9.2 fL (ref 8.6–12.4)
Monocytes Absolute: 0.3 10*3/uL (ref 0.1–1.0)
Monocytes Relative: 4 % (ref 3–12)
Neutro Abs: 5.5 10*3/uL (ref 1.7–7.7)
Neutrophils Relative %: 68 % (ref 43–77)
PLATELETS: 317 10*3/uL (ref 150–400)
RBC: 4.03 MIL/uL (ref 3.87–5.11)
RDW: 15.9 % — AB (ref 11.5–15.5)
RUBELLA: 2.39 {index} — AB (ref <0.90)
Rh Type: POSITIVE
WBC: 8.1 10*3/uL (ref 4.0–10.5)

## 2014-07-01 LAB — CULTURE, OB URINE

## 2014-07-01 LAB — VITAMIN D 25 HYDROXY (VIT D DEFICIENCY, FRACTURES): Vit D, 25-Hydroxy: 10 ng/mL — ABNORMAL LOW (ref 30–100)

## 2014-07-01 LAB — HIV ANTIBODY (ROUTINE TESTING W REFLEX): HIV 1&2 Ab, 4th Generation: NONREACTIVE

## 2014-07-01 LAB — VARICELLA ZOSTER ANTIBODY, IGG: VARICELLA IGG: 2117 {index} — AB (ref <135.00)

## 2014-07-03 LAB — SURESWAB, VAGINOSIS/VAGINITIS PLUS
Atopobium vaginae: 5.5 Log (cells/mL)
BV CATEGORY: UNDETERMINED — AB
C. GLABRATA, DNA: NOT DETECTED
C. TROPICALIS, DNA: NOT DETECTED
C. albicans, DNA: NOT DETECTED
C. parapsilosis, DNA: NOT DETECTED
C. trachomatis RNA, TMA: NOT DETECTED
Gardnerella vaginalis: >8 Log (cells/mL)
LACTOBACILLUS SPECIES: 7.6 Log (cells/mL)
MEGASPHAERA SPECIES: 5.2 Log (cells/mL)
N. gonorrhoeae RNA, TMA: NOT DETECTED
T. vaginalis RNA, QL TMA: NOT DETECTED

## 2014-07-04 ENCOUNTER — Other Ambulatory Visit: Payer: Self-pay | Admitting: Obstetrics

## 2014-07-04 DIAGNOSIS — B9689 Other specified bacterial agents as the cause of diseases classified elsewhere: Secondary | ICD-10-CM

## 2014-07-04 DIAGNOSIS — N760 Acute vaginitis: Principal | ICD-10-CM

## 2014-07-04 LAB — PAP IG AND HPV HIGH-RISK: HPV DNA HIGH RISK: NOT DETECTED

## 2014-07-04 LAB — HEMOGLOBINOPATHY EVALUATION
HGB F QUANT: 0 % (ref 0.0–2.0)
Hemoglobin Other: 0 %
Hgb A2 Quant: 2.1 % — ABNORMAL LOW (ref 2.2–3.2)
Hgb A: 97.9 % — ABNORMAL HIGH (ref 96.8–97.8)
Hgb S Quant: 0 %

## 2014-07-04 MED ORDER — TINIDAZOLE 500 MG PO TABS: 1000.0000 mg | ORAL_TABLET | Freq: Every day | ORAL | Status: DC

## 2014-07-06 ENCOUNTER — Other Ambulatory Visit: Payer: Self-pay | Admitting: *Deleted

## 2014-07-06 DIAGNOSIS — B9689 Other specified bacterial agents as the cause of diseases classified elsewhere: Secondary | ICD-10-CM

## 2014-07-06 DIAGNOSIS — N760 Acute vaginitis: Principal | ICD-10-CM

## 2014-07-06 MED ORDER — METRONIDAZOLE 500 MG PO TABS: 500.0000 mg | ORAL_TABLET | Freq: Two times a day (BID) | ORAL | Status: DC

## 2014-07-06 NOTE — Progress Notes (Signed)
Pt medication change to Metronidazole due to insurance will not approve Tinidazole as pt does not have history of Metronidazole failure.

## 2014-07-06 NOTE — Progress Notes (Signed)
Patient notified - discussed reason for Rx- will check for prior authorization with Rosalita ChessmanSuzanne

## 2014-07-20 ENCOUNTER — Other Ambulatory Visit: Payer: Self-pay | Admitting: Obstetrics

## 2014-07-20 DIAGNOSIS — O210 Mild hyperemesis gravidarum: Secondary | ICD-10-CM

## 2014-07-20 MED ORDER — VITAMIN B-6 50 MG PO TABS: 50.0000 mg | ORAL_TABLET | ORAL | Status: DC

## 2014-07-20 MED ORDER — DOXYLAMINE SUCCINATE (SLEEP) 25 MG PO TABS: 25.0000 mg | ORAL_TABLET | Freq: Every evening | ORAL | Status: DC | PRN

## 2014-07-21 ENCOUNTER — Encounter (HOSPITAL_COMMUNITY): Payer: Self-pay | Admitting: *Deleted

## 2014-07-21 ENCOUNTER — Inpatient Hospital Stay (HOSPITAL_COMMUNITY)
Admission: AD | Admit: 2014-07-21 | Discharge: 2014-07-21 | Disposition: A | Discharge status: Home / Self Care | Payer: Medicaid Other | Source: Ambulatory Visit | Attending: Obstetrics | Admitting: Obstetrics

## 2014-07-21 DIAGNOSIS — O219 Vomiting of pregnancy, unspecified: Secondary | ICD-10-CM

## 2014-07-21 DIAGNOSIS — Z87891 Personal history of nicotine dependence: Secondary | ICD-10-CM | POA: Insufficient documentation

## 2014-07-21 DIAGNOSIS — Z3A12 12 weeks gestation of pregnancy: Secondary | ICD-10-CM | POA: Diagnosis not present

## 2014-07-21 DIAGNOSIS — Z3A11 11 weeks gestation of pregnancy: Secondary | ICD-10-CM | POA: Diagnosis not present

## 2014-07-21 DIAGNOSIS — O210 Mild hyperemesis gravidarum: Secondary | ICD-10-CM | POA: Insufficient documentation

## 2014-07-21 LAB — URINALYSIS, ROUTINE W REFLEX MICROSCOPIC
Bilirubin Urine: NEGATIVE
GLUCOSE, UA: NEGATIVE mg/dL
HGB URINE DIPSTICK: NEGATIVE
KETONES UR: NEGATIVE mg/dL
LEUKOCYTES UA: NEGATIVE
Nitrite: NEGATIVE
PROTEIN: NEGATIVE mg/dL
Specific Gravity, Urine: 1.02 (ref 1.005–1.030)
Urobilinogen, UA: 0.2 mg/dL (ref 0.0–1.0)
pH: 8 (ref 5.0–8.0)

## 2014-07-21 MED ORDER — ONDANSETRON HCL 4 MG PO TABS: 4.0000 mg | ORAL_TABLET | Freq: Four times a day (QID) | ORAL | Status: DC

## 2014-07-21 MED ORDER — ONDANSETRON 8 MG PO TBDP
8.0000 mg | ORAL_TABLET | Freq: Once | ORAL | Status: AC
  Administered 2014-07-21: 8 mg via ORAL
  Filled 2014-07-21: qty 1

## 2014-07-21 NOTE — MAU Note (Signed)
Pt states she has been vomiting every time she eats for the last 3 days, also HA for 3 days.  Had occasional vomiting before, also nausea.  Feeling tired, weak.  States Dr. Clearance CootsHarper called her yesterday & told her to take unisom & B6, she took it last night & immediately vomited.  Denies abd pain, bleeding or discharge.

## 2014-07-21 NOTE — Discharge Instructions (Signed)
Morning Sickness °Morning sickness is when you feel sick to your stomach (nauseous) during pregnancy. You may feel sick to your stomach and throw up (vomit). You may feel sick in the morning, but you can feel this way any time of day. Some women feel very sick to their stomach and cannot stop throwing up (hyperemesis gravidarum). °HOME CARE °· Only take medicines as told by your doctor. °· Take multivitamins as told by your doctor. Taking multivitamins before getting pregnant can stop or lessen the harshness of morning sickness. °· Eat dry toast or unsalted crackers before getting out of bed. °· Eat 5 to 6 small meals a day. °· Eat dry and bland foods like rice and baked potatoes. °· Do not drink liquids with meals. Drink between meals. °· Do not eat greasy, fatty, or spicy foods. °· Have someone cook for you if the smell of food causes you to feel sick or throw up. °· If you feel sick to your stomach after taking prenatal vitamins, take them at night or with a snack. °· Eat protein when you need a snack (nuts, yogurt, cheese). °· Eat unsweetened gelatins for dessert. °· Wear a bracelet used for sea sickness (acupressure wristband). °· Go to a doctor that puts thin needles into certain body points (acupuncture) to improve how you feel. °· Do not smoke. °· Use a humidifier to keep the air in your house free of odors. °· Get lots of fresh air. °GET HELP IF: °· You need medicine to feel better. °· You feel dizzy or lightheaded. °· You are losing weight. °GET HELP RIGHT AWAY IF:  °· You feel very sick to your stomach and cannot stop throwing up. °· You pass out (faint). °MAKE SURE YOU: °· Understand these instructions. °· Will watch your condition. °· Will get help right away if you are not doing well or get worse. °Document Released: 05/02/2004 Document Revised: 03/30/2013 Document Reviewed: 09/09/2012 °ExitCare® Patient Information ©2015 [employer]. This information is not intended to replace advice given to you by  your health care provider. Make sure you discuss any questions you have with your health care provider. ° °Eating Plan for Hyperemesis Gravidarum °Severe cases of hyperemesis gravidarum can lead to dehydration and malnutrition. The hyperemesis eating plan is one way to lessen the symptoms of nausea and vomiting. It is often used with prescribed medicines to control your symptoms.  °WHAT CAN I DO TO RELIEVE MY SYMPTOMS? °Listen to your body. Everyone is different and has different preferences. Find what works best for you. Some of the following things may help: °· Eat and drink slowly. °· Eat 5-6 small meals daily instead of 3 large meals.   °· Eat crackers before you get out of bed in the morning.   °· Starchy foods are usually well tolerated (such as cereal, toast, bread, potatoes, pasta, rice, and pretzels).   °· Ginger may help with nausea. Add ¼ tsp ground ginger to hot tea or choose ginger tea.   °· Try drinking 100% fruit juice or an electrolyte drink. °· Continue to take your prenatal vitamins as directed by your health care provider. If you are having trouble taking your prenatal vitamins, talk with your health care provider about different options. °· Include at least 1 serving of protein with your meals and snacks (such as meats or poultry, beans, nuts, eggs, or yogurt). Try eating a protein-rich snack before bed (such as cheese and crackers or a half turkey or peanut butter sandwich). °WHAT THINGS SHOULD I   AVOID TO REDUCE MY SYMPTOMS? °The following things may help reduce your symptoms: °· Avoid foods with strong smells. Try eating meals in well-ventilated areas that are free of odors. °· Avoid drinking water or other beverages with meals. Try not to drink anything less than 30 minutes before and after meals. °· Avoid drinking more than 1 cup of fluid at a time. °· Avoid fried or high-fat foods, such as butter and cream sauces. °· Avoid spicy foods. °· Avoid skipping meals the best you can. Nausea can be  more intense on an empty stomach. If you cannot tolerate food at that time, do not force it. Try sucking on ice chips or other frozen items and make up the calories later. °· Avoid lying down within 2 hours after eating. °Document Released: 01/20/2007 Document Revised: 03/30/2013 Document Reviewed: 01/27/2013 °ExitCare® Patient Information ©2015 [employer]. This information is not intended to replace advice given to you by your health care provider. Make sure you discuss any questions you have with your health care provider. ° °

## 2014-07-21 NOTE — MAU Provider Note (Signed)
History     CSN: [id number]  Arrival date and time: 07/21/14 1333   First Provider Initiated Contact with Patient 07/21/14 1424      Chief Complaint  Patient presents with  . Emesis During Pregnancy  . Headache   HPI  [name] is a 34 y.o. [id number] at .619w6d who presents to MAU today with complaint of N/V. The patient states this has been going on throughout the pregnancy and that she has tried Reglan, Phenergan and Unisom/B6 without relief. She states that she is unable to tolerate PO today. She has had increased "spitting" as well. She denies abdominal pain, vaginal bleeding or discharge. She states mild headache.   OB History    Gravida Para Term Preterm AB TAB SAB Ectopic Multiple Living   12 7 7  0 4 0 4 0 0 5      Past Medical History  Diagnosis Date  . History of gonorrhea   . Asthma     2010  . Depression     2010  . Gestational diabetes     2012  . Hypertension     2004    Past Surgical History  Procedure Laterality Date  . Dilation and curettage of uterus    . Cholecystectomy    . Wisdom tooth extraction      Family History  Problem Relation Age of Onset  . Hypertension Mother   . Diabetes Mother   . Cancer Mother   . Hypertension Father   . Diabetes Father   . Hyperlipidemia Father   . Arthritis    . Asthma    . Breast cancer    . Heart failure    . Congenital heart disease    . Depression    . Heart attack      History  Substance Use Topics  . Smoking status: Former Smoker    Types: Cigarettes    Quit date: 10/31/2010  . Smokeless tobacco: Never Used  . Alcohol Use: No    Allergies:  Allergies  Allergen Reactions  . Shellfish Allergy Anaphylaxis  . Bee Venom Swelling    Prescriptions prior to admission  Medication Sig Dispense Refill Last Dose  . doxylamine, Sleep, (UNISOM) 25 MG tablet Take 1 tablet (25 mg total) by mouth at bedtime as needed. 30 tablet 5 07/20/2014 at Unknown time  . labetalol (NORMODYNE) 300  MG tablet Take 1 tablet (300 mg total) by mouth 2 (two) times daily. 60 tablet 11 07/21/2014 at Unknown time  . metoCLOPramide (REGLAN) 10 MG tablet Take 1 tablet (10 mg total) by mouth every 8 (eight) hours. (Patient not taking: Reported on 07/21/2014) 30 tablet 1 Taking  . metroNIDAZOLE (FLAGYL) 500 MG tablet Take 1 tablet (500 mg total) by mouth 2 (two) times daily. For 7 days (Patient not taking: Reported on 07/21/2014) 14 tablet 2   . Prenat-FeCbn-FeAspGl-FA-Omega (OB COMPLETE PETITE) 35-5-1-200 MG CAPS Take 1 capsule by mouth daily before breakfast. (Patient not taking: Reported on 07/21/2014) 90 capsule 3 Taking  . promethazine (PHENERGAN) 12.5 MG tablet Take 1 tablet (12.5 mg total) by mouth every 6 (six) hours as needed for nausea or vomiting. (Patient not taking: Reported on 07/21/2014) 30 tablet 0 Taking  . pyridOXINE (VITAMIN B-6) 50 MG tablet Take 1 tablet (50 mg total) by mouth 2 (two) times daily in the am and at bedtime.. (Patient not taking: Reported on 07/21/2014) 60 tablet 5     Review of Systems  Constitutional: Negative  for fever and malaise/fatigue.  Gastrointestinal: Positive for nausea and vomiting. Negative for abdominal pain.  Genitourinary:       Neg - vaginal bleeding, discharge, LOF   Physical Exam   Blood pressure 122/69, pulse 88, temperature 98.1 F (36.7 C), temperature source Oral, resp. rate 18, last menstrual period 04/29/2014, unknown if currently breastfeeding.  Physical Exam  Constitutional: She is oriented to person, place, and time. She appears well-developed and well-nourished. No distress.  HENT:  Head: Normocephalic.  Cardiovascular: Normal rate.   Respiratory: Effort normal.  GI: Soft. Bowel sounds are normal. She exhibits no distension and no mass. There is no tenderness. There is no rebound and no guarding.  Neurological: She is alert and oriented to person, place, and time.  Skin: Skin is warm and dry. No erythema.  Psychiatric: She has a normal  mood and affect.   Results for orders placed or performed during the hospital encounter of 07/21/14 (from the past 24 hour(s))  Urinalysis, Routine w reflex microscopic     Status: Abnormal   Collection Time: 07/21/14  1:47 PM  Result Value Ref Range   Color, Urine YELLOW YELLOW   APPearance CLOUDY (A) CLEAR   Specific Gravity, Urine 1.020 1.005 - 1.030   pH 8.0 5.0 - 8.0   Glucose, UA NEGATIVE NEGATIVE mg/dL   Hgb urine dipstick NEGATIVE NEGATIVE   Bilirubin Urine NEGATIVE NEGATIVE   Ketones, ur NEGATIVE NEGATIVE mg/dL   Protein, ur NEGATIVE NEGATIVE mg/dL   Urobilinogen, UA 0.2 0.0 - 1.0 mg/dL   Nitrite NEGATIVE NEGATIVE   Leukocytes, UA NEGATIVE NEGATIVE    MAU Course  Procedures  MDM UA today shows no significant dehydration ODT - Zofran ordered No episodes of emesis while in MAU. Patient ate prior to arrival.   Assessment and Plan  A: SIUP at [redacted]w[redacted]d Nausea and vomiting in pregnancy prior to [redacted] weeks gestation  P: Discharge home Rx for Zofran sent to patient's pharmacy AVS contains information about diet for N/V in pregnancy Patient advised to follow-up with Dr. Clearance Coots as scheduled for routine prenatal care Patient may return to MAU as needed or if her condition were to change or worsen   Marny Lowenstein, PA-C  07/21/2014, 3:30 PM

## 2014-07-25 ENCOUNTER — Other Ambulatory Visit: Payer: Self-pay | Admitting: *Deleted

## 2014-07-25 ENCOUNTER — Telehealth: Payer: Self-pay | Admitting: *Deleted

## 2014-07-25 DIAGNOSIS — O219 Vomiting of pregnancy, unspecified: Secondary | ICD-10-CM

## 2014-07-25 MED ORDER — ONDANSETRON 8 MG PO TBDP: 8.0000 mg | ORAL_TABLET | Freq: Three times a day (TID) | ORAL | Status: DC | PRN

## 2014-07-25 NOTE — Telephone Encounter (Signed)
Patient is calling for a refill of her Zofran sublingual ( Rx from hospital). Patient states it is the only thing that helps. Told patient would check with provider and see what he recommends. LM on VM.

## 2014-07-27 ENCOUNTER — Ambulatory Visit (INDEPENDENT_AMBULATORY_CARE_PROVIDER_SITE_OTHER): Payer: Medicaid Other | Admitting: Obstetrics

## 2014-07-27 VITALS — BP 126/84 | HR 92 | Wt 296.0 lb

## 2014-07-27 DIAGNOSIS — O2241 Hemorrhoids in pregnancy, first trimester: Secondary | ICD-10-CM

## 2014-07-27 DIAGNOSIS — Z3482 Encounter for supervision of other normal pregnancy, second trimester: Secondary | ICD-10-CM | POA: Diagnosis not present

## 2014-07-27 DIAGNOSIS — K5909 Other constipation: Secondary | ICD-10-CM

## 2014-07-27 DIAGNOSIS — K5904 Chronic idiopathic constipation: Secondary | ICD-10-CM

## 2014-07-27 LAB — POCT URINALYSIS DIPSTICK
Bilirubin, UA: NEGATIVE
Blood, UA: NEGATIVE
GLUCOSE UA: NEGATIVE
Ketones, UA: NEGATIVE
Leukocytes, UA: NEGATIVE
Nitrite, UA: NEGATIVE
Protein, UA: NEGATIVE
Spec Grav, UA: 1.015
UROBILINOGEN UA: NEGATIVE
pH, UA: 7

## 2014-07-27 MED ORDER — HYDROCORTISONE ACETATE 25 MG RE SUPP: 25.0000 mg | Freq: Two times a day (BID) | RECTAL | Status: DC

## 2014-07-27 MED ORDER — DOCUSATE SODIUM 100 MG PO CAPS: 100.0000 mg | ORAL_CAPSULE | Freq: Two times a day (BID) | ORAL | Status: DC

## 2014-07-28 ENCOUNTER — Encounter: Payer: Self-pay | Admitting: Obstetrics

## 2014-07-28 NOTE — Progress Notes (Signed)
  Subjective:    Sandie AnoLaquetha T Raschke is a 34 y.o. female being seen today for her obstetrical visit. She is at [redacted]w[redacted]d gestation. Patient reports: painful hemorrhoids.  Problem List Items Addressed This Visit    None    Visit Diagnoses    Encounter for supervision of other normal pregnancy in second trimester    -  Primary    Relevant Orders    POCT urinalysis dipstick (Completed)    Hemorrhoids during pregnancy in first trimester        Relevant Medications    hydrocortisone (ANUSOL-HC) 25 MG suppository    Constipation - functional        Relevant Medications    docusate sodium (COLACE) 100 MG capsule      Patient Active Problem List   Diagnosis Date Noted  . Osteoarthritis of right knee 04/22/2014  . Essential hypertension 07/18/2012  . LUMBOSACRAL STRAIN, ACUTE 07/05/2008  . Morbid obesity 02/24/2008  . ASTHMA UNSPECIFIED WITH EXACERBATION 02/24/2008  . HYPERGLYCEMIA, BORDERLINE 02/24/2008    Objective:     BP 126/84 mmHg  Pulse 92  Wt 296 lb (134.265 kg)  LMP 04/29/2014 Uterine Size: Below umbilicus     Assessment:    Pregnancy @ 106w6d  weeks  Hemorrhoids  Constipation  Plan:   Anusol HC Rx Dietary changes, Colace and increased fluids recommended.   Problem list reviewed and updated. Labs reviewed.  Follow up in 4 weeks. FIRST/CF mutation testing/NIPT/QUAD SCREEN/fragile X/Ashkenazi Jewish population testing/Spinal muscular atrophy discussed: requested. Role of ultrasound in pregnancy discussed; fetal survey: requested. Amniocentesis discussed: not indicated.

## 2014-08-24 ENCOUNTER — Ambulatory Visit (INDEPENDENT_AMBULATORY_CARE_PROVIDER_SITE_OTHER): Payer: Medicaid Other | Admitting: Certified Nurse Midwife

## 2014-08-24 VITALS — Wt 295.8 lb

## 2014-08-24 DIAGNOSIS — O0942 Supervision of pregnancy with grand multiparity, second trimester: Secondary | ICD-10-CM | POA: Diagnosis not present

## 2014-08-24 LAB — POCT URINALYSIS DIPSTICK
Bilirubin, UA: NEGATIVE
GLUCOSE UA: NEGATIVE
Ketones, UA: NEGATIVE
LEUKOCYTES UA: NEGATIVE
NITRITE UA: NEGATIVE
Protein, UA: NEGATIVE
RBC UA: NEGATIVE
Spec Grav, UA: 1.015
UROBILINOGEN UA: NEGATIVE
pH, UA: 7

## 2014-08-24 NOTE — Progress Notes (Signed)
  Subjective:    Crystal Burns is a 34 y.o. female being seen today for her obstetrical visit. She is at 5532w5d gestation. Patient reports: backache, no contractions, no bleeding, no leaking. Does report nausea and occasional vomiting that is improved with Zofran, has tried Dicelgis, and regland.  Employed full time at [employer] and part time at [employer].  Diet consists of water, Gatorade, fruits, salads.    Problem List Items Addressed This Visit    None    Visit Diagnoses    Supervision of pregnancy with grand multiparity in second trimester    -  Primary    Relevant Orders    POCT urinalysis dipstick (Completed)    AFP, Quad Screen    US OB Comp + 14 Wk      Patient Active Problem List   Diagnosis Date Noted  . Osteoarthritis of right knee 04/22/2014  . Essential hypertension 07/18/2012  . LUMBOSACRAL STRAIN, ACUTE 07/05/2008  . Morbid obesity 02/24/2008  . ASTHMA UNSPECIFIED WITH EXACERBATION 02/24/2008  . HYPERGLYCEMIA, BORDERLINE 02/24/2008    Objective:     Wt 134.174 kg (295 lb 12.8 oz)  LMP 04/29/2014 Uterine Size: Below umbilicus   FHR: 150's.    Assessment:    Pregnancy @ 3332w5d  weeks Doing well    N&V in pregnancy  Plan:    Problem list reviewed and updated. Labs reviewed. Follow up in 4 weeks. FIRST/CF mutation testing/NIPT/QUAD SCREEN/fragile X/Ashkenazi Jewish population testing/Spinal muscular atrophy discussed: ordered. Role of ultrasound in pregnancy discussed; fetal survey: ordered. Amniocentesis discussed: not indicated. Desires BTL for birthcontrol.   50% of 15 minute visit spent on counseling and coordination of care.

## 2014-08-26 LAB — AFP, QUAD SCREEN
AFP: 34.8 ng/mL
Age Alone: 1:388 {titer}
CURR GEST AGE: 16.5 wks.days
HCG TOTAL: 23.05 [IU]/mL
INH: 220.4 pg/mL
INTERPRETATION-AFP: NEGATIVE
MoM for AFP: 1.41
MoM for INH: 1.88
MoM for hCG: 0.95
Open Spina bifida: NEGATIVE
Osb Risk: 1:7150 {titer}
Tri 18 Scr Risk Est: NEGATIVE
Trisomy 18 (Edward) Syndrome Interp.: 1:28700 {titer}
uE3 Mom: 0.91
uE3 Value: 0.74 ng/mL

## 2014-09-02 ENCOUNTER — Encounter (HOSPITAL_COMMUNITY): Payer: Self-pay

## 2014-09-02 ENCOUNTER — Ambulatory Visit (HOSPITAL_COMMUNITY)
Admission: RE | Admit: 2014-09-02 | Discharge: 2014-09-02 | Disposition: A | Discharge status: Home / Self Care | Payer: Medicaid Other | Source: Ambulatory Visit | Attending: Certified Nurse Midwife | Admitting: Certified Nurse Midwife

## 2014-09-02 ENCOUNTER — Other Ambulatory Visit: Payer: Self-pay | Admitting: Certified Nurse Midwife

## 2014-09-02 DIAGNOSIS — Z3A18 18 weeks gestation of pregnancy: Secondary | ICD-10-CM | POA: Insufficient documentation

## 2014-09-02 DIAGNOSIS — O0942 Supervision of pregnancy with grand multiparity, second trimester: Secondary | ICD-10-CM

## 2014-09-02 DIAGNOSIS — Z3689 Encounter for other specified antenatal screening: Secondary | ICD-10-CM | POA: Insufficient documentation

## 2014-09-02 DIAGNOSIS — O99210 Obesity complicating pregnancy, unspecified trimester: Secondary | ICD-10-CM

## 2014-09-02 DIAGNOSIS — O4402 Placenta previa specified as without hemorrhage, second trimester: Secondary | ICD-10-CM | POA: Insufficient documentation

## 2014-09-09 ENCOUNTER — Ambulatory Visit (HOSPITAL_COMMUNITY): Payer: Medicaid Other

## 2014-09-20 ENCOUNTER — Other Ambulatory Visit: Payer: Self-pay | Admitting: Certified Nurse Midwife

## 2014-09-20 DIAGNOSIS — O09892 Supervision of other high risk pregnancies, second trimester: Secondary | ICD-10-CM

## 2014-09-21 ENCOUNTER — Ambulatory Visit (INDEPENDENT_AMBULATORY_CARE_PROVIDER_SITE_OTHER): Payer: Medicaid Other | Admitting: Certified Nurse Midwife

## 2014-09-21 VITALS — BP 118/76 | HR 88 | Temp 99.1°F | Wt 299.0 lb

## 2014-09-21 DIAGNOSIS — Z3482 Encounter for supervision of other normal pregnancy, second trimester: Secondary | ICD-10-CM

## 2014-09-21 LAB — POCT URINALYSIS DIPSTICK
Bilirubin, UA: NEGATIVE
Blood, UA: NEGATIVE
Glucose, UA: NEGATIVE
Ketones, UA: NEGATIVE
Leukocytes, UA: NEGATIVE
Nitrite, UA: NEGATIVE
Protein, UA: NEGATIVE
Spec Grav, UA: 1.01
Urobilinogen, UA: NEGATIVE
pH, UA: 7

## 2014-09-21 NOTE — Progress Notes (Signed)
Subjective:    Crystal Burns is a 34 y.o. female being seen today for her obstetrical visit. She is at [redacted]w[redacted]d gestation. Patient reports: heartburn, nausea, no bleeding, no contractions and no leaking . Patient employed.  She would like maternity support belt.  Fetal movement: normal.  Problem List Items Addressed This Visit    None    Visit Diagnoses    Encounter for supervision of other normal pregnancy in second trimester    -  Primary    Relevant Orders    POCT urinalysis dipstick (Completed)      Patient Active Problem List   Diagnosis Date Noted  . [redacted] weeks gestation of pregnancy   . Encounter for fetal anatomic survey   . Maternal morbid obesity, antepartum   . Placenta previa antepartum in second trimester   . Osteoarthritis of right knee 04/22/2014  . Essential hypertension 07/18/2012  . LUMBOSACRAL STRAIN, ACUTE 07/05/2008  . Morbid obesity 02/24/2008  . ASTHMA UNSPECIFIED WITH EXACERBATION 02/24/2008  . HYPERGLYCEMIA, BORDERLINE 02/24/2008   Objective:    BP 118/76 mmHg  Pulse 88  Temp(Src) 99.1 F (37.3 C)  Wt 299 lb (135.626 kg)  LMP 04/29/2014 FHT: 130's BPM  Uterine Size: size equals dates     Assessment:    Pregnancy @ [redacted]w[redacted]d    Lumbar back pain/lower abdominal pain in pannus Obesity Doing well.  Plan:    OBGCT: discussed. Signs and symptoms of preterm labor: discussed. Rx Maternity abdominal support belt Reviewed ultrasound findings. Labs, problem list reviewed and updated 2 hr GTT planned Follow up in 4 weeks.

## 2014-09-22 ENCOUNTER — Encounter: Payer: Self-pay | Admitting: Certified Nurse Midwife

## 2014-10-05 ENCOUNTER — Telehealth: Payer: Self-pay | Admitting: *Deleted

## 2014-10-05 NOTE — Telephone Encounter (Signed)
Patient states she has increased swelling in her ankles for the last 4-5 days, her stomach is sore at the umbilicus, and she is having slight headaches. She is taking her blood pressure medication.Crystal Mastersold Burns to call patient to schedule her for an appointment for evaluation. Patient instructed to go to MAU if her symptoms get worse.

## 2014-10-06 ENCOUNTER — Ambulatory Visit (HOSPITAL_COMMUNITY)
Admission: RE | Admit: 2014-10-06 | Discharge: 2014-10-06 | Disposition: A | Discharge status: Home / Self Care | Payer: Medicaid Other | Source: Ambulatory Visit | Attending: Certified Nurse Midwife | Admitting: Certified Nurse Midwife

## 2014-10-06 ENCOUNTER — Ambulatory Visit (INDEPENDENT_AMBULATORY_CARE_PROVIDER_SITE_OTHER): Payer: Medicaid Other | Admitting: Obstetrics

## 2014-10-06 ENCOUNTER — Other Ambulatory Visit: Payer: Self-pay | Admitting: Certified Nurse Midwife

## 2014-10-06 ENCOUNTER — Encounter: Payer: Self-pay | Admitting: Obstetrics

## 2014-10-06 VITALS — BP 118/72 | HR 92 | Temp 97.9°F | Wt 302.0 lb

## 2014-10-06 DIAGNOSIS — Z0489 Encounter for examination and observation for other specified reasons: Secondary | ICD-10-CM | POA: Insufficient documentation

## 2014-10-06 DIAGNOSIS — O09892 Supervision of other high risk pregnancies, second trimester: Secondary | ICD-10-CM | POA: Diagnosis not present

## 2014-10-06 DIAGNOSIS — Z3492 Encounter for supervision of normal pregnancy, unspecified, second trimester: Secondary | ICD-10-CM

## 2014-10-06 DIAGNOSIS — O99212 Obesity complicating pregnancy, second trimester: Secondary | ICD-10-CM

## 2014-10-06 DIAGNOSIS — O4402 Placenta previa specified as without hemorrhage, second trimester: Secondary | ICD-10-CM

## 2014-10-06 DIAGNOSIS — Z3A22 22 weeks gestation of pregnancy: Secondary | ICD-10-CM | POA: Insufficient documentation

## 2014-10-06 DIAGNOSIS — IMO0002 Reserved for concepts with insufficient information to code with codable children: Secondary | ICD-10-CM | POA: Insufficient documentation

## 2014-10-06 LAB — POCT URINALYSIS DIPSTICK
Bilirubin, UA: NEGATIVE
Blood, UA: NEGATIVE
Glucose, UA: NORMAL
KETONES UA: NEGATIVE
Leukocytes, UA: NEGATIVE
Nitrite, UA: NEGATIVE
PH UA: 7
Protein, UA: NEGATIVE
Spec Grav, UA: <=1.005
UROBILINOGEN UA: NEGATIVE

## 2014-10-06 NOTE — Progress Notes (Signed)
Subjective:    Crystal Burns is a 34 y.o. female being seen today for her obstetrical visit. She is at 6421w6d gestation. Patient reports: pelvic pressure and pain . Fetal movement: normal.  Problem List Items Addressed This Visit    None    Visit Diagnoses    Prenatal care, second trimester    -  Primary    Relevant Orders    POCT urinalysis dipstick (Completed)      Patient Active Problem List   Diagnosis Date Noted  . Evaluate anatomy not seen on prior sonogram   . [redacted] weeks gestation of pregnancy   . [redacted] weeks gestation of pregnancy   . Encounter for fetal anatomic survey   . Maternal morbid obesity, antepartum   . Placenta previa antepartum in second trimester   . Osteoarthritis of right knee 04/22/2014  . Essential hypertension 07/18/2012  . LUMBOSACRAL STRAIN, ACUTE 07/05/2008  . Morbid obesity 02/24/2008  . ASTHMA UNSPECIFIED WITH EXACERBATION 02/24/2008  . HYPERGLYCEMIA, BORDERLINE 02/24/2008   Objective:    BP 118/72 mmHg  Pulse 92  Temp(Src) 97.9 F (36.6 C)  Wt 302 lb (136.986 kg)  LMP 04/29/2014 FHT: 150 BPM  Uterine Size: not measured due to morbid obesity     Assessment:    Pregnancy @ 7121w6d    Plan:    OBGCT: discussed.  Labs, problem list reviewed and updated 2 hr GTT planned Follow up in 4 weeks.

## 2014-10-07 ENCOUNTER — Other Ambulatory Visit: Payer: Self-pay | Admitting: Certified Nurse Midwife

## 2014-10-11 ENCOUNTER — Other Ambulatory Visit: Payer: Self-pay | Admitting: Certified Nurse Midwife

## 2014-10-19 ENCOUNTER — Encounter: Payer: Self-pay | Admitting: *Deleted

## 2014-10-19 ENCOUNTER — Other Ambulatory Visit: Payer: Medicaid Other

## 2014-10-19 ENCOUNTER — Ambulatory Visit (INDEPENDENT_AMBULATORY_CARE_PROVIDER_SITE_OTHER): Payer: Medicaid Other | Admitting: Certified Nurse Midwife

## 2014-10-19 VITALS — BP 130/83 | HR 80 | Temp 98.6°F | Wt 298.0 lb

## 2014-10-19 DIAGNOSIS — O0942 Supervision of pregnancy with grand multiparity, second trimester: Secondary | ICD-10-CM | POA: Diagnosis not present

## 2014-10-19 LAB — POCT URINALYSIS DIPSTICK
Bilirubin, UA: NEGATIVE
Blood, UA: NEGATIVE
Glucose, UA: NEGATIVE
Ketones, UA: NEGATIVE
Leukocytes, UA: NEGATIVE
Nitrite, UA: NEGATIVE
PROTEIN UA: NEGATIVE
Spec Grav, UA: 1.015
UROBILINOGEN UA: NEGATIVE
pH, UA: 5

## 2014-10-19 NOTE — Progress Notes (Signed)
Subjective:    Crystal Burns is a 34 y.o. female being seen today for her obstetrical visit. She is at 2961w5d gestation. Patient reports: backache, no bleeding, no contractions, no cramping, no leaking and lower leg swelling after working . Fetal movement: normal.  Problem List Items Addressed This Visit    None    Visit Diagnoses    Encounter for supervision of other normal pregnancy in second trimester    -  Primary    Relevant Orders    CBC    Glucose Tolerance, 2 Hours w/1 Hour    HIV antibody    RPR    POCT urinalysis dipstick      Patient Active Problem List   Diagnosis Date Noted  . Evaluate anatomy not seen on prior sonogram   . [redacted] weeks gestation of pregnancy   . [redacted] weeks gestation of pregnancy   . Encounter for fetal anatomic survey   . Maternal morbid obesity, antepartum   . Placenta previa antepartum in second trimester   . Osteoarthritis of right knee 04/22/2014  . Essential hypertension 07/18/2012  . LUMBOSACRAL STRAIN, ACUTE 07/05/2008  . Morbid obesity 02/24/2008  . ASTHMA UNSPECIFIED WITH EXACERBATION 02/24/2008  . HYPERGLYCEMIA, BORDERLINE 02/24/2008   Objective:    BP 130/83 mmHg  Pulse 80  Temp(Src) 98.6 F (37 C)  Wt 298 lb (135.172 kg)  LMP 04/29/2014 FHT: 135 BPM  Uterine Size: unable to determine d/t body habitus/morbid obesity     Assessment:    Pregnancy @ 861w5d    Back pain/abdominal pain d/t pannus Morbid obesity  Plan:    OBGCT: ordered. Signs and symptoms of preterm labor: discussed. Encouraged her to go pick up maternity support belt.   Letter for work schedule of non-consecutive days.   Labs, problem list reviewed and updated 2 hr GTT planned Follow up in 2 weeks.

## 2014-10-20 ENCOUNTER — Other Ambulatory Visit: Payer: Self-pay | Admitting: Certified Nurse Midwife

## 2014-10-20 DIAGNOSIS — O09522 Supervision of elderly multigravida, second trimester: Secondary | ICD-10-CM

## 2014-10-20 LAB — CBC
HEMATOCRIT: 30.5 % — AB (ref 36.0–46.0)
HEMOGLOBIN: 9.7 g/dL — AB (ref 12.0–15.0)
MCH: 25.6 pg — AB (ref 26.0–34.0)
MCHC: 31.8 g/dL (ref 30.0–36.0)
MCV: 80.5 fL (ref 78.0–100.0)
MPV: 8.8 fL (ref 8.6–12.4)
Platelets: 269 10*3/uL (ref 150–400)
RBC: 3.79 MIL/uL — AB (ref 3.87–5.11)
RDW: 15.7 % — AB (ref 11.5–15.5)
WBC: 9.3 10*3/uL (ref 4.0–10.5)

## 2014-10-20 LAB — GLUCOSE TOLERANCE, 2 HOURS W/ 1HR
Glucose, 1 hour: 153 mg/dL (ref 70–170)
Glucose, 2 hour: 110 mg/dL (ref 70–139)
Glucose, Fasting: 75 mg/dL (ref 70–99)

## 2014-10-20 LAB — HIV ANTIBODY (ROUTINE TESTING W REFLEX): HIV: NONREACTIVE

## 2014-10-20 LAB — RPR

## 2014-10-20 MED ORDER — CITRANATAL 90 DHA 90-1 & 300 MG PO MISC: 1.0000 | Freq: Every day | ORAL | Status: DC

## 2014-10-26 ENCOUNTER — Other Ambulatory Visit: Payer: Self-pay | Admitting: Obstetrics

## 2014-10-27 NOTE — Telephone Encounter (Signed)
Please advise 

## 2014-10-28 ENCOUNTER — Other Ambulatory Visit: Payer: Self-pay | Admitting: Obstetrics

## 2014-11-03 ENCOUNTER — Encounter: Payer: Self-pay | Admitting: Obstetrics

## 2014-11-03 ENCOUNTER — Ambulatory Visit (INDEPENDENT_AMBULATORY_CARE_PROVIDER_SITE_OTHER): Payer: Medicaid Other | Admitting: Obstetrics

## 2014-11-03 VITALS — BP 135/84 | HR 93 | Temp 97.0°F | Wt 301.0 lb

## 2014-11-03 DIAGNOSIS — O4413 Placenta previa with hemorrhage, third trimester: Secondary | ICD-10-CM

## 2014-11-03 DIAGNOSIS — O4403 Placenta previa specified as without hemorrhage, third trimester: Secondary | ICD-10-CM

## 2014-11-03 DIAGNOSIS — Z3482 Encounter for supervision of other normal pregnancy, second trimester: Secondary | ICD-10-CM | POA: Diagnosis not present

## 2014-11-03 LAB — POCT URINALYSIS DIPSTICK
Bilirubin, UA: NEGATIVE
Blood, UA: NEGATIVE
Glucose, UA: NEGATIVE
Ketones, UA: NEGATIVE
Leukocytes, UA: NEGATIVE
NITRITE UA: NEGATIVE
PH UA: 6
Protein, UA: NEGATIVE
Spec Grav, UA: 1.015
Urobilinogen, UA: NEGATIVE

## 2014-11-03 NOTE — Progress Notes (Signed)
Subjective:    Crystal Burns is a 34 y.o. female being seen today for her obstetrical visit. She is at 104w6d gestation. Patient reports: no complaints . Fetal movement: normal.  Problem List Items Addressed This Visit    None    Visit Diagnoses    Encounter for supervision of other normal pregnancy in second trimester    -  Primary    Relevant Orders    POCT urinalysis dipstick (Completed)    Placenta previa antepartum in third trimester        Relevant Orders    US OB Comp + 14 Wk      Patient Active Problem List   Diagnosis Date Noted  . Evaluate anatomy not seen on prior sonogram   . [redacted] weeks gestation of pregnancy   . [redacted] weeks gestation of pregnancy   . Encounter for fetal anatomic survey   . Maternal morbid obesity, antepartum   . Placenta previa antepartum in second trimester   . Osteoarthritis of right knee 04/22/2014  . Essential hypertension 07/18/2012  . LUMBOSACRAL STRAIN, ACUTE 07/05/2008  . Morbid obesity 02/24/2008  . ASTHMA UNSPECIFIED WITH EXACERBATION 02/24/2008  . HYPERGLYCEMIA, BORDERLINE 02/24/2008   Objective:    BP 135/84 mmHg  Pulse 93  Temp(Src) 97 F (36.1 C)  Wt 301 lb (136.533 kg)  LMP 04/29/2014 FHT: 150 BPM  Uterine Size: size equals dates     Assessment:    Pregnancy @ [redacted]w[redacted]d    Plan:    OBGCT: discussed.  Labs, problem list reviewed and updated 2 hr GTT planned Follow up in 2 weeks.

## 2014-11-16 ENCOUNTER — Encounter: Payer: Self-pay | Admitting: Obstetrics

## 2014-11-16 ENCOUNTER — Ambulatory Visit (INDEPENDENT_AMBULATORY_CARE_PROVIDER_SITE_OTHER): Payer: Medicaid Other | Admitting: Obstetrics

## 2014-11-16 VITALS — BP 137/84 | HR 101 | Temp 98.2°F | Wt 296.0 lb

## 2014-11-16 DIAGNOSIS — Z3483 Encounter for supervision of other normal pregnancy, third trimester: Secondary | ICD-10-CM

## 2014-11-16 DIAGNOSIS — O4403 Placenta previa specified as without hemorrhage, third trimester: Secondary | ICD-10-CM

## 2014-11-16 DIAGNOSIS — O4413 Placenta previa with hemorrhage, third trimester: Secondary | ICD-10-CM

## 2014-11-16 LAB — POCT URINALYSIS DIPSTICK
BILIRUBIN UA: NEGATIVE
Glucose, UA: NEGATIVE
Ketones, UA: NEGATIVE
Leukocytes, UA: NEGATIVE
Nitrite, UA: NEGATIVE
PH UA: 6.5
Protein, UA: NEGATIVE
RBC UA: NEGATIVE
Spec Grav, UA: 1.015
UROBILINOGEN UA: NEGATIVE

## 2014-11-16 NOTE — Progress Notes (Signed)
Subjective:    Crystal Burns is a 34 y.o. female being seen today for her obstetrical visit. She is at [redacted]w[redacted]d gestation. Patient reports no complaints. Fetal movement: normal.  Problem List Items Addressed This Visit    None    Visit Diagnoses    Encounter for supervision of other normal pregnancy in second trimester    -  Primary    Relevant Orders    POCT urinalysis dipstick (Completed)      Patient Active Problem List   Diagnosis Date Noted  . Evaluate anatomy not seen on prior sonogram   . [redacted] weeks gestation of pregnancy   . [redacted] weeks gestation of pregnancy   . Encounter for fetal anatomic survey   . Maternal morbid obesity, antepartum   . Placenta previa antepartum in second trimester   . Osteoarthritis of right knee 04/22/2014  . Essential hypertension 07/18/2012  . LUMBOSACRAL STRAIN, ACUTE 07/05/2008  . Morbid obesity 02/24/2008  . ASTHMA UNSPECIFIED WITH EXACERBATION 02/24/2008  . HYPERGLYCEMIA, BORDERLINE 02/24/2008   Objective:    BP 137/84 mmHg  Pulse 101  Temp(Src) 98.2 F (36.8 C)  Wt 296 lb (134.265 kg)  LMP 04/29/2014 FHT:  150 BPM  Uterine Size: Indeterminable due to obesity   Presentation: unsure     Assessment:    Pregnancy @ [redacted]w[redacted]d weeks   Plan:     labs reviewed, problem list updated Consent signed. GBS sent TDAP offered  Rhogam given for RH negative Pediatrician: discussed. Infant feeding: plans to breastfeed. Maternity leave: discussed. Cigarette smoking: former smoker. Orders Placed This Encounter  Procedures  . POCT urinalysis dipstick   No orders of the defined types were placed in this encounter.   Follow up in 2 Weeks.

## 2014-11-23 ENCOUNTER — Ambulatory Visit (HOSPITAL_COMMUNITY)
Admission: RE | Admit: 2014-11-23 | Discharge: 2014-11-23 | Disposition: A | Discharge status: Home / Self Care | Payer: Medicaid Other | Source: Ambulatory Visit | Attending: Certified Nurse Midwife | Admitting: Certified Nurse Midwife

## 2014-11-23 DIAGNOSIS — O0942 Supervision of pregnancy with grand multiparity, second trimester: Secondary | ICD-10-CM | POA: Diagnosis present

## 2014-11-24 ENCOUNTER — Other Ambulatory Visit: Payer: Self-pay | Admitting: Certified Nurse Midwife

## 2014-11-30 ENCOUNTER — Encounter: Payer: Medicaid Other | Admitting: Obstetrics

## 2014-12-01 ENCOUNTER — Encounter: Payer: Medicaid Other | Admitting: Obstetrics

## 2014-12-05 ENCOUNTER — Encounter: Payer: Medicaid Other | Admitting: Obstetrics

## 2014-12-06 ENCOUNTER — Ambulatory Visit (INDEPENDENT_AMBULATORY_CARE_PROVIDER_SITE_OTHER): Payer: Medicaid Other | Admitting: Obstetrics

## 2014-12-06 VITALS — BP 126/85 | HR 96 | Temp 98.3°F | Wt 293.0 lb

## 2014-12-06 DIAGNOSIS — G4700 Insomnia, unspecified: Secondary | ICD-10-CM | POA: Diagnosis not present

## 2014-12-06 DIAGNOSIS — R52 Pain, unspecified: Secondary | ICD-10-CM | POA: Diagnosis not present

## 2014-12-06 DIAGNOSIS — Z3483 Encounter for supervision of other normal pregnancy, third trimester: Secondary | ICD-10-CM

## 2014-12-06 LAB — POCT URINALYSIS DIPSTICK
Bilirubin, UA: NEGATIVE
Glucose, UA: NORMAL
Ketones, UA: NEGATIVE
Leukocytes, UA: NEGATIVE
Nitrite, UA: NEGATIVE
PH UA: 6
Protein, UA: NEGATIVE
SPEC GRAV UA: 1.015
UROBILINOGEN UA: 1

## 2014-12-06 MED ORDER — OXYCODONE HCL 10 MG PO TABS: 10.0000 mg | ORAL_TABLET | Freq: Four times a day (QID) | ORAL | Status: DC | PRN

## 2014-12-06 MED ORDER — ZOLPIDEM TARTRATE 10 MG PO TABS: 10.0000 mg | ORAL_TABLET | Freq: Every evening | ORAL | Status: AC | PRN

## 2014-12-07 ENCOUNTER — Encounter: Payer: Self-pay | Admitting: Obstetrics

## 2014-12-07 NOTE — Progress Notes (Signed)
Subjective:    Crystal Burns is a 34 y.o. female being seen today for her obstetrical visit. She is at [redacted]w[redacted]d gestation. Patient reports backache, fatigue and occasional contractions. Fetal movement: normal.  Problem List Items Addressed This Visit    None    Visit Diagnoses    Pain aggravated by activities of daily living    -  Primary    Relevant Medications    Oxycodone HCl 10 MG TABS    Insomnia        Relevant Medications    zolpidem (AMBIEN) 10 MG tablet    Encounter for supervision of other normal pregnancy in second trimester        Relevant Orders    POCT urinalysis dipstick (Completed)      Patient Active Problem List   Diagnosis Date Noted  . Evaluate anatomy not seen on prior sonogram   . [redacted] weeks gestation of pregnancy   . [redacted] weeks gestation of pregnancy   . Encounter for fetal anatomic survey   . Maternal morbid obesity, antepartum   . Placenta previa antepartum in second trimester   . Osteoarthritis of right knee 04/22/2014  . Essential hypertension 07/18/2012  . LUMBOSACRAL STRAIN, ACUTE 07/05/2008  . Morbid obesity 02/24/2008  . ASTHMA UNSPECIFIED WITH EXACERBATION 02/24/2008  . HYPERGLYCEMIA, BORDERLINE 02/24/2008   Objective:    BP 126/85 mmHg  Pulse 96  Temp(Src) 98.3 F (36.8 C)  Wt 293 lb (132.904 kg)  LMP 04/29/2014 FHT:  150 BPM  Uterine Size: size equals dates  Presentation: unsure     Assessment:    Pregnancy @ [redacted]w[redacted]d weeks    Backache  Insomnia  Plan:   Oxycodone Rx Ambien Rx    labs reviewed, problem list updated Consent signed. GBS sent TDAP offered  Rhogam given for RH negative Pediatrician: discussed. Infant feeding: plans to breastfeed. Maternity leave: discussed. Cigarette smoking: former smoker. Orders Placed This Encounter  Procedures  . POCT urinalysis dipstick   Meds ordered this encounter  Medications  . Oxycodone HCl 10 MG TABS    Sig: Take 1 tablet (10 mg total) by mouth every 6 (six) hours as  needed.    Dispense:  40 tablet    Refill:  0  . zolpidem (AMBIEN) 10 MG tablet    Sig: Take 1 tablet (10 mg total) by mouth at bedtime as needed for sleep.    Dispense:  30 tablet    Refill:  3   Follow up in 2 Weeks.

## 2014-12-19 ENCOUNTER — Telehealth: Payer: Self-pay | Admitting: *Deleted

## 2014-12-19 NOTE — Telephone Encounter (Signed)
Patient has questions about leaking fluid 4:30 call to patient- LM on VM- Patient needs to be evaluated- here or at the hospital. If she feels she is truly leaking she needs to go to the hospital per Dr Clearance Coots.

## 2014-12-20 ENCOUNTER — Ambulatory Visit (INDEPENDENT_AMBULATORY_CARE_PROVIDER_SITE_OTHER): Payer: Medicaid Other | Admitting: Obstetrics

## 2014-12-20 VITALS — BP 129/89 | HR 82 | Wt 293.0 lb

## 2014-12-20 DIAGNOSIS — O3663X1 Maternal care for excessive fetal growth, third trimester, fetus 1: Secondary | ICD-10-CM

## 2014-12-20 DIAGNOSIS — Z3482 Encounter for supervision of other normal pregnancy, second trimester: Secondary | ICD-10-CM

## 2014-12-20 LAB — POCT URINALYSIS DIPSTICK
Bilirubin, UA: NEGATIVE
Blood, UA: NEGATIVE
Glucose, UA: NEGATIVE
Ketones, UA: NEGATIVE
Leukocytes, UA: NEGATIVE
Nitrite, UA: NEGATIVE
PROTEIN UA: NEGATIVE
SPEC GRAV UA: 1.015
UROBILINOGEN UA: NEGATIVE
pH, UA: 6

## 2014-12-21 ENCOUNTER — Encounter: Payer: Self-pay | Admitting: Obstetrics

## 2014-12-21 NOTE — Progress Notes (Signed)
Subjective:    Crystal Burns is a 34 y.o. female being seen today for her obstetrical visit. She is at [redacted]w[redacted]d gestation. Patient reports no complaints. Fetal movement: normal.  Problem List Items Addressed This Visit    None    Visit Diagnoses    Encounter for supervision of other normal pregnancy in second trimester    -  Primary    Relevant Orders    POCT urinalysis dipstick (Completed)    SureSwab, Vaginosis/Vaginitis Plus    [place] [facility] OB FOLLOW UP    LGA (large for gestational age) fetus affecting management of mother, third trimester, fetus 1        Relevant Orders    [place] [facility] OB FOLLOW UP      Patient Active Problem List   Diagnosis Date Noted  . Evaluate anatomy not seen on prior sonogram   . [redacted] weeks gestation of pregnancy   . [redacted] weeks gestation of pregnancy   . Encounter for fetal anatomic survey   . Maternal morbid obesity, antepartum   . Placenta previa antepartum in second trimester   . Osteoarthritis of right knee 04/22/2014  . Essential hypertension 07/18/2012  . LUMBOSACRAL STRAIN, ACUTE 07/05/2008  . Morbid obesity 02/24/2008  . ASTHMA UNSPECIFIED WITH EXACERBATION 02/24/2008  . HYPERGLYCEMIA, BORDERLINE 02/24/2008   Objective:    BP 129/89 mmHg  Pulse 82  Wt 293 lb (132.904 kg)  LMP 04/29/2014 FHT:  150 BPM  Uterine Size: size greater than dates  Presentation: unsure     Assessment:    Pregnancy @ [redacted]w[redacted]d weeks   Plan:     labs reviewed, problem list updated Consent signed. GBS sent TDAP offered  Rhogam given for RH negative Pediatrician: discussed. Infant feeding: plans to breastfeed. Maternity leave: discussed. Cigarette smoking: former smoker. Orders Placed This Encounter  Procedures  . SureSwab, Vaginosis/Vaginitis Plus  . [place] [facility] OB FOLLOW UP    Standing Status: Future     Number of Occurrences:      Standing Expiration Date: 02/19/2016    Order Specific Question:  Reason for Exam (SYMPTOM  OR DIAGNOSIS REQUIRED)    Answer:  LGA    Order Specific Question:  Preferred imaging location?    Answer:  [facility]  . POCT urinalysis dipstick   No orders of the defined types were placed in this encounter.   Follow up in 2 Weeks.

## 2014-12-23 ENCOUNTER — Other Ambulatory Visit: Payer: Self-pay | Admitting: Obstetrics

## 2014-12-23 DIAGNOSIS — N760 Acute vaginitis: Principal | ICD-10-CM

## 2014-12-23 DIAGNOSIS — B9689 Other specified bacterial agents as the cause of diseases classified elsewhere: Secondary | ICD-10-CM

## 2014-12-23 LAB — SURESWAB, VAGINOSIS/VAGINITIS PLUS
ATOPOBIUM VAGINAE: NOT DETECTED Log (cells/mL)
BV CATEGORY: UNDETERMINED — AB
C. ALBICANS, DNA: NOT DETECTED
C. TRACHOMATIS RNA, TMA: NOT DETECTED
C. TROPICALIS, DNA: NOT DETECTED
C. glabrata, DNA: NOT DETECTED
C. parapsilosis, DNA: NOT DETECTED
GARDNERELLA VAGINALIS: 6.2 Log (cells/mL)
LACTOBACILLUS SPECIES: 7.7 Log (cells/mL)
MEGASPHAERA SPECIES: NOT DETECTED Log (cells/mL)
N. gonorrhoeae RNA, TMA: NOT DETECTED
T. VAGINALIS RNA, QL TMA: NOT DETECTED

## 2014-12-23 MED ORDER — METRONIDAZOLE 500 MG PO TABS: 500.0000 mg | ORAL_TABLET | Freq: Two times a day (BID) | ORAL | Status: DC

## 2014-12-26 ENCOUNTER — Encounter (HOSPITAL_COMMUNITY): Payer: Self-pay | Admitting: *Deleted

## 2014-12-26 ENCOUNTER — Inpatient Hospital Stay (HOSPITAL_COMMUNITY)
Admission: AD | Admit: 2014-12-26 | Discharge: 2014-12-26 | Disposition: A | Discharge status: Home / Self Care | Payer: Medicaid Other | Source: Ambulatory Visit | Attending: Obstetrics | Admitting: Obstetrics

## 2014-12-26 ENCOUNTER — Telehealth: Payer: Self-pay | Admitting: *Deleted

## 2014-12-26 DIAGNOSIS — Z3493 Encounter for supervision of normal pregnancy, unspecified, third trimester: Secondary | ICD-10-CM | POA: Insufficient documentation

## 2014-12-26 DIAGNOSIS — O218 Other vomiting complicating pregnancy: Secondary | ICD-10-CM | POA: Diagnosis not present

## 2014-12-26 DIAGNOSIS — O9989 Other specified diseases and conditions complicating pregnancy, childbirth and the puerperium: Secondary | ICD-10-CM | POA: Diagnosis not present

## 2014-12-26 DIAGNOSIS — R102 Pelvic and perineal pain: Secondary | ICD-10-CM

## 2014-12-26 LAB — URINALYSIS, ROUTINE W REFLEX MICROSCOPIC
Bilirubin Urine: NEGATIVE
Glucose, UA: NEGATIVE mg/dL
Hgb urine dipstick: NEGATIVE
KETONES UR: 15 mg/dL — AB
Leukocytes, UA: NEGATIVE
NITRITE: NEGATIVE
Protein, ur: NEGATIVE mg/dL
Urobilinogen, UA: 2 mg/dL — ABNORMAL HIGH (ref 0.0–1.0)
pH: 6 (ref 5.0–8.0)

## 2014-12-26 LAB — GLUCOSE, CAPILLARY: Glucose-Capillary: 97 mg/dL (ref 65–99)

## 2014-12-26 NOTE — Telephone Encounter (Signed)
Contacted patient to discuss test results. While on the phone patient states she has been having headaches and swelling. Patient states she has been in bed since yesterday. Patient states her ear is hurting and she feels "stopped up" and achy. Patient  States she has been throwing up and having irregular contractions. Patient states she feels like her blood pressure may be up. Spoke with Dr. Clearance Coots and his recommendation is to be evaluated at [facility]. Patient advised of recommendation and verbalized understanding.

## 2014-12-26 NOTE — [facility] Note (Signed)
States hasn't been feeling well, nausea, throwing up, ? Leaking - dr says it is normal from the baby being low.  Feeling lots of pressure.

## 2014-12-26 NOTE — Discharge Instructions (Signed)
Hypoglycemia °Hypoglycemia occurs when the glucose in your blood is too low. Glucose is a type of sugar that is your body's main energy source. Hormones, such as insulin and glucagon, control the level of glucose in the blood. Insulin lowers blood glucose and glucagon increases blood glucose. Having too much insulin in your blood stream, or not eating enough food containing sugar, can result in hypoglycemia. Hypoglycemia can happen to people with or without diabetes. It can develop quickly and can be a medical emergency.  °CAUSES  °· Missing or delaying meals. °· Not eating enough carbohydrates at meals. °· Taking too much diabetes medicine. °· Not timing your oral diabetes medicine or insulin doses with meals, snacks, and exercise. °· Nausea and vomiting. °· Certain medicines. °· Severe illnesses, such as hepatitis, kidney disorders, and certain eating disorders. °· Increased activity or exercise without eating something extra or adjusting medicines. °· Drinking too much alcohol. °· A nerve disorder that affects body functions like your heart rate, blood pressure, and digestion (autonomic neuropathy). °· A condition where the stomach muscles do not function properly (gastroparesis). Therefore, medicines and food may not absorb properly. °· Rarely, a tumor of the pancreas can produce too much insulin. °SYMPTOMS  °· Hunger. °· Sweating (diaphoresis). °· Change in body temperature. °· Shakiness. °· Headache. °· Anxiety. °· Lightheadedness. °· Irritability. °· Difficulty concentrating. °· Dry mouth. °· Tingling or numbness in the hands or feet. °· Restless sleep or sleep disturbances. °· Altered speech and coordination. °· Change in mental status. °· Seizures or prolonged convulsions. °· Combativeness. °· Drowsiness (lethargic). °· Weakness. °· Increased heart rate or palpitations. °· Confusion. °· Pale, gray skin color. °· Blurred or double vision. °· Fainting. °DIAGNOSIS  °A physical exam and medical history will be  performed. Your caregiver may make a diagnosis based on your symptoms. Blood tests and other lab tests may be performed to confirm a diagnosis. Once the diagnosis is made, your caregiver will see if your signs and symptoms go away once your blood glucose is raised.  °TREATMENT  °Usually, you can easily treat your hypoglycemia when you notice symptoms. °· Check your blood glucose. If it is less than 70 mg/dl, take one of the following:   °¨ 3-4 glucose tablets.   °¨ ½ cup juice.   °¨ ½ cup regular soda.   °¨ 1 cup skim milk.   °¨ ½-1 tube of glucose gel.   °¨ 5-6 hard candies.   °· Avoid high-fat drinks or food that may delay a rise in blood glucose levels. °· Do not take more than the recommended amount of sugary foods, drinks, gel, or tablets. Doing so will cause your blood glucose to go too high.   °· Wait 10-15 minutes and recheck your blood glucose. If it is still less than 70 mg/dl or below your target range, repeat treatment.   °· Eat a snack if it is more than 1 hour until your next meal.   °There may be a time when your blood glucose may go so low that you are unable to treat yourself at home when you start to notice symptoms. You may need someone to help you. You may even faint or be unable to swallow. If you cannot treat yourself, someone will need to bring you to the hospital.  °HOME CARE INSTRUCTIONS °· If you have diabetes, follow your diabetes management plan by: °¨ Taking your medicines as directed. °¨ Following your exercise plan. °¨ Following your meal plan. Do not skip meals. Eat on time. °¨ Testing your blood   glucose regularly. Check your blood glucose before and after exercise. If you exercise longer or different than usual, be sure to check blood glucose more frequently. °¨ Wearing your medical alert jewelry that says you have diabetes. °· Identify the cause of your hypoglycemia. Then, develop ways to prevent the recurrence of hypoglycemia. °· Do not take a hot bath or shower right after an  insulin shot. °· Always carry treatment with you. Glucose tablets are the easiest to carry. °· If you are going to drink alcohol, drink it only with meals. °· Tell friends or family members ways to keep you safe during a seizure. This may include removing hard or sharp objects from the area or turning you on your side. °· Maintain a healthy weight. °SEEK MEDICAL CARE IF:  °· You are having problems keeping your blood glucose in your target range. °· You are having frequent episodes of hypoglycemia. °· You feel you might be having side effects from your medicines. °· You are not sure why your blood glucose is dropping so low. °· You notice a change in vision or a new problem with your vision. °SEEK IMMEDIATE MEDICAL CARE IF:  °· Confusion develops. °· A change in mental status occurs. °· The inability to swallow develops. °· Fainting occurs. °Document Released: 03/25/2005 Document Revised: 03/30/2013 Document Reviewed: 07/22/2011 °Exit[employer]® Patient Information ©2015 [employer]. This information is not intended to replace advice given to you by your health care provider. Make sure you discuss any questions you have with your health care provider. ° °

## 2014-12-26 NOTE — [facility] Provider Note (Signed)
History   [id number] at 34.3 wks in with feeling weak and jittery. Denies contractions, vag bleeding or ROM  CSN: [id number]  Arrival date and time: 12/26/14 1838   None     Chief Complaint  Patient presents with  . Nausea  . pelvic pressure    HPI  OB History    Gravida Para Term Preterm AB TAB SAB Ectopic Multiple Living   0 4 0 4 0 0 6      Past Medical History  Diagnosis Date  . History of gonorrhea   . Asthma     2010  . Depression     2010  . Gestational diabetes     2012  . Hypertension     2004    Past Surgical History  Procedure Laterality Date  . Dilation and curettage of uterus    . Cholecystectomy    . Wisdom tooth extraction      Family History  Problem Relation Age of Onset  . Hypertension Mother   . Diabetes Mother   . Cancer Mother   . Hypertension Father   . Diabetes Father   . Hyperlipidemia Father   . Arthritis    . Asthma    . Breast cancer    . Heart failure    . Congenital heart disease    . Depression    . Heart attack      Social History  Substance Use Topics  . Smoking status: Former Smoker    Types: Cigarettes    Quit date: 10/31/2010  . Smokeless tobacco: Never Used  . Alcohol Use: No    Allergies:  Allergies  Allergen Reactions  . Shellfish Allergy Anaphylaxis  . Bee Venom Swelling    Prescriptions prior to admission  Medication Sig Dispense Refill Last Dose  . docusate sodium (COLACE) 100 MG capsule Take 1 capsule (100 mg total) by mouth 2 (two) times daily. (Patient not taking: Reported on 09/21/2014) 60 capsule 11 Not Taking  . hydrocortisone (ANUSOL-HC) 25 MG suppository Place 1 suppository (25 mg total) rectally 2 (two) times daily. (Patient not taking: Reported on 09/21/2014) 24 suppository 11 Not Taking  . labetalol (NORMODYNE) 300 MG tablet Take 1 tablet (300 mg total) by mouth 2 (two) times daily. 60 tablet 11 Taking  . metroNIDAZOLE (FLAGYL) 500 MG tablet Take 1 tablet (500 mg total) by mouth 2  (two) times daily. 14 tablet 2   . ondansetron (ZOFRAN) 4 MG tablet Take 1 tablet (4 mg total) by mouth every 6 (six) hours. 12 tablet 0 Taking  . ondansetron (ZOFRAN-ODT) 8 MG disintegrating tablet TAKE 1 TABLET (8 MG TOTAL) BY MOUTH EVERY 8 (EIGHT) HOURS AS NEEDED FOR NAUSEA OR VOMITING. 30 tablet 2 Taking  . Oxycodone HCl 10 MG TABS Take 1 tablet (10 mg total) by mouth every 6 (six) hours as needed. 40 tablet 0   . Prenat w/o A-FeCbGl-DSS-FA-DHA (CITRANATAL 90 DHA) 90-1 & 300 MG MISC Take 1 tablet by mouth daily. 60 each 6 Taking  . zolpidem (AMBIEN) 10 MG tablet Take 1 tablet (10 mg total) by mouth at bedtime as needed for sleep. 30 tablet 3     Review of Systems  HENT: Negative.   Eyes: Negative.   Respiratory: Negative.   Cardiovascular: Negative.   Gastrointestinal: Negative.   Genitourinary: Negative.   Musculoskeletal: Negative.   Skin: Negative.   Neurological: Positive for weakness.  Endo/Heme/Allergies: Negative.   Psychiatric/Behavioral: Negative.    Physical  Exam   Blood pressure 138/85, pulse 93, last menstrual period 04/29/2014, unknown if currently breastfeeding.  Physical Exam  Constitutional: She is oriented to person, place, and time. She appears well-developed and well-nourished.  HENT:  Head: Normocephalic.  Eyes: Pupils are equal, round, and reactive to light.  Neck: Normal range of motion.  Cardiovascular: Normal rate, regular rhythm, normal heart sounds and intact distal pulses.   Respiratory: Effort normal and breath sounds normal.  GI: Soft. Bowel sounds are normal.  Genitourinary: Vagina normal and uterus normal.  Musculoskeletal: Normal range of motion.  Neurological: She is alert and oriented to person, place, and time. She has normal reflexes.  Skin: Skin is warm and dry.  Psychiatric: She has a normal mood and affect. Her behavior is normal. Judgment and thought content normal.    [facility] Course  Procedures  MDM General discomfort of  pregnancy Morbid obwesity  Assessment and Plan  SVE cl/th/post.high. FHR pattern reassurring VSS, CBG stable. Will d/c home  Aseel Uhde DARLENE 12/26/2014, 7:04 PM

## 2014-12-27 ENCOUNTER — Encounter: Payer: Medicaid Other | Admitting: Obstetrics

## 2014-12-28 ENCOUNTER — Ambulatory Visit (INDEPENDENT_AMBULATORY_CARE_PROVIDER_SITE_OTHER): Payer: Medicaid Other | Admitting: Obstetrics

## 2014-12-28 ENCOUNTER — Encounter: Payer: Self-pay | Admitting: *Deleted

## 2014-12-28 VITALS — BP 129/85 | HR 86 | Wt 291.8 lb

## 2014-12-28 DIAGNOSIS — Z3483 Encounter for supervision of other normal pregnancy, third trimester: Secondary | ICD-10-CM

## 2014-12-28 DIAGNOSIS — N760 Acute vaginitis: Secondary | ICD-10-CM

## 2014-12-28 DIAGNOSIS — A499 Bacterial infection, unspecified: Secondary | ICD-10-CM

## 2014-12-28 DIAGNOSIS — B9689 Other specified bacterial agents as the cause of diseases classified elsewhere: Secondary | ICD-10-CM

## 2014-12-28 LAB — POCT URINALYSIS DIPSTICK
Bilirubin, UA: NEGATIVE
Blood, UA: NEGATIVE
Glucose, UA: NEGATIVE
Ketones, UA: NEGATIVE
LEUKOCYTES UA: NEGATIVE
NITRITE UA: NEGATIVE
PROTEIN UA: NEGATIVE
Spec Grav, UA: 1.01
Urobilinogen, UA: NEGATIVE
pH, UA: 7.5

## 2014-12-28 MED ORDER — CLINDAMYCIN HCL 300 MG PO CAPS: 300.0000 mg | ORAL_CAPSULE | Freq: Three times a day (TID) | ORAL | Status: DC

## 2014-12-29 ENCOUNTER — Encounter: Payer: Self-pay | Admitting: Obstetrics

## 2014-12-29 NOTE — Progress Notes (Signed)
Subjective:    Crystal Burns is a 34 y.o. female being seen today for her obstetrical visit. She is at [redacted]w[redacted]d gestation. Patient reports no complaints. Fetal movement: normal.  Problem List Items Addressed This Visit    None    Visit Diagnoses    Encounter for supervision of other normal pregnancy in third trimester    -  Primary    Relevant Orders    POCT urinalysis dipstick (Completed)    BV (bacterial vaginosis)        Relevant Medications    clindamycin (CLEOCIN) 300 MG capsule      Patient Active Problem List   Diagnosis Date Noted  . Evaluate anatomy not seen on prior sonogram   . [redacted] weeks gestation of pregnancy   . [redacted] weeks gestation of pregnancy   . Encounter for fetal anatomic survey   . Maternal morbid obesity, antepartum   . Placenta previa antepartum in second trimester   . Osteoarthritis of right knee 04/22/2014  . Essential hypertension 07/18/2012  . LUMBOSACRAL STRAIN, ACUTE 07/05/2008  . Morbid obesity 02/24/2008  . ASTHMA UNSPECIFIED WITH EXACERBATION 02/24/2008  . HYPERGLYCEMIA, BORDERLINE 02/24/2008   Objective:    BP 129/85 mmHg  Pulse 86  Wt 291 lb 12.8 oz (132.36 kg)  LMP 04/29/2014 FHT:  150 BPM  Uterine Size: size equals dates  Presentation: unsure     Assessment:    Pregnancy @ [redacted]w[redacted]d weeks   Plan:     labs reviewed, problem list updated Consent signed. GBS sent TDAP offered  Rhogam given for RH negative Pediatrician: discussed. Infant feeding: plans to breastfeed. Maternity leave: discussed. Cigarette smoking: former smoker. Orders Placed This Encounter  Procedures  . POCT urinalysis dipstick   Meds ordered this encounter  Medications  . clindamycin (CLEOCIN) 300 MG capsule    Sig: Take 1 capsule (300 mg total) by mouth 3 (three) times daily.    Dispense:  21 capsule    Refill:  0   Follow up in 2 Weeks.

## 2014-12-30 ENCOUNTER — Ambulatory Visit (HOSPITAL_COMMUNITY)
Admission: RE | Admit: 2014-12-30 | Discharge: 2014-12-30 | Disposition: A | Discharge status: Home / Self Care | Payer: Medicaid Other | Source: Ambulatory Visit | Attending: Obstetrics | Admitting: Obstetrics

## 2014-12-30 ENCOUNTER — Other Ambulatory Visit: Payer: Self-pay | Admitting: Obstetrics

## 2014-12-30 DIAGNOSIS — Z3A35 35 weeks gestation of pregnancy: Secondary | ICD-10-CM

## 2014-12-30 DIAGNOSIS — O163 Unspecified maternal hypertension, third trimester: Secondary | ICD-10-CM | POA: Insufficient documentation

## 2014-12-30 DIAGNOSIS — O99213 Obesity complicating pregnancy, third trimester: Secondary | ICD-10-CM

## 2014-12-30 DIAGNOSIS — O3663X1 Maternal care for excessive fetal growth, third trimester, fetus 1: Secondary | ICD-10-CM

## 2014-12-30 DIAGNOSIS — Z3482 Encounter for supervision of other normal pregnancy, second trimester: Secondary | ICD-10-CM

## 2014-12-30 DIAGNOSIS — E669 Obesity, unspecified: Secondary | ICD-10-CM | POA: Insufficient documentation

## 2015-01-04 ENCOUNTER — Encounter: Payer: Self-pay | Admitting: Obstetrics

## 2015-01-04 ENCOUNTER — Ambulatory Visit (INDEPENDENT_AMBULATORY_CARE_PROVIDER_SITE_OTHER): Payer: Medicaid Other | Admitting: Obstetrics

## 2015-01-04 VITALS — BP 135/86 | HR 93 | Wt 291.0 lb

## 2015-01-04 DIAGNOSIS — O368130 Decreased fetal movements, third trimester, not applicable or unspecified: Secondary | ICD-10-CM | POA: Diagnosis not present

## 2015-01-04 DIAGNOSIS — O24419 Gestational diabetes mellitus in pregnancy, unspecified control: Secondary | ICD-10-CM

## 2015-01-04 DIAGNOSIS — Z3483 Encounter for supervision of other normal pregnancy, third trimester: Secondary | ICD-10-CM

## 2015-01-04 LAB — POCT URINALYSIS DIPSTICK
Bilirubin, UA: NEGATIVE
GLUCOSE UA: NEGATIVE
Ketones, UA: NEGATIVE
NITRITE UA: NEGATIVE
PH UA: 5
SPEC GRAV UA: 1.02
UROBILINOGEN UA: NEGATIVE

## 2015-01-04 MED ORDER — METFORMIN HCL ER 500 MG PO TB24
1000.0000 mg | ORAL_TABLET | Freq: Two times a day (BID) | ORAL | Status: DC
Start: 2015-01-04 — End: 2015-01-29

## 2015-01-04 NOTE — Progress Notes (Signed)
CBG at today's visit: 192, pt states that she had breakfast around 8am - 2 small chicken biscuits.

## 2015-01-04 NOTE — Progress Notes (Signed)
Subjective:    Crystal Burns is a 34 y.o. female being seen today for her obstetrical visit. She is at [redacted]w[redacted]d gestation. Patient reports no complaints. Fetal movement: normal.  Problem List Items Addressed This Visit    None    Visit Diagnoses    Encounter for supervision of other normal pregnancy in third trimester    -  Primary    Relevant Orders    POCT urinalysis dipstick (Completed)    GDM, class A2        Relevant Medications    metFORMIN (GLUCOPHAGE XR) 500 MG 24 hr tablet      Patient Active Problem List   Diagnosis Date Noted  . Evaluate anatomy not seen on prior sonogram   . [redacted] weeks gestation of pregnancy   . [redacted] weeks gestation of pregnancy   . Encounter for fetal anatomic survey   . Maternal morbid obesity, antepartum   . Placenta previa antepartum in second trimester   . Osteoarthritis of right knee 04/22/2014  . Essential hypertension 07/18/2012  . LUMBOSACRAL STRAIN, ACUTE 07/05/2008  . Morbid obesity 02/24/2008  . ASTHMA UNSPECIFIED WITH EXACERBATION 02/24/2008  . HYPERGLYCEMIA, BORDERLINE 02/24/2008   Objective:    BP 135/86 mmHg  Pulse 93  Wt 291 lb (131.997 kg)  LMP 04/29/2014 FHT:  150 BPM  Uterine Size: size greater than dates  Presentation: unsure     Assessment:    Pregnancy @ [redacted]w[redacted]d weeks   Plan:     labs reviewed, problem list updated Consent signed. GBS sent TDAP offered  Rhogam given for RH negative Pediatrician: discussed. Infant feeding: plans to breastfeed. Maternity leave: discussed. Cigarette smoking: former smoker . Orders Placed This Encounter  Procedures  . POCT urinalysis dipstick   Meds ordered this encounter  Medications  . metFORMIN (GLUCOPHAGE XR) 500 MG 24 hr tablet    Sig: Take 2 tablets (1,000 mg total) by mouth 2 (two) times daily after a meal.    Dispense:  120 tablet    Refill:  11   Follow up in 1 Week.

## 2015-01-11 ENCOUNTER — Telehealth: Payer: Self-pay | Admitting: Obstetrics

## 2015-01-11 ENCOUNTER — Encounter: Payer: Medicaid Other | Admitting: Obstetrics

## 2015-01-12 ENCOUNTER — Encounter: Payer: Medicaid Other | Admitting: Obstetrics

## 2015-01-12 ENCOUNTER — Ambulatory Visit (INDEPENDENT_AMBULATORY_CARE_PROVIDER_SITE_OTHER): Payer: Self-pay | Admitting: Certified Nurse Midwife

## 2015-01-12 VITALS — BP 135/83 | HR 96 | Temp 97.6°F | Wt 292.0 lb

## 2015-01-12 DIAGNOSIS — O24913 Unspecified diabetes mellitus in pregnancy, third trimester: Secondary | ICD-10-CM | POA: Diagnosis not present

## 2015-01-12 DIAGNOSIS — O0993 Supervision of high risk pregnancy, unspecified, third trimester: Secondary | ICD-10-CM

## 2015-01-12 DIAGNOSIS — O1002 Pre-existing essential hypertension complicating childbirth: Secondary | ICD-10-CM | POA: Diagnosis not present

## 2015-01-12 DIAGNOSIS — R52 Pain, unspecified: Secondary | ICD-10-CM

## 2015-01-12 LAB — POCT URINALYSIS DIPSTICK
Bilirubin, UA: NEGATIVE
Blood, UA: NEGATIVE
Glucose, UA: NEGATIVE
Ketones, UA: NEGATIVE
LEUKOCYTES UA: NEGATIVE
NITRITE UA: NEGATIVE
PROTEIN UA: NEGATIVE
Spec Grav, UA: 1.02
Urobilinogen, UA: NEGATIVE
pH, UA: 5

## 2015-01-12 MED ORDER — OXYCODONE HCL 10 MG PO TABS: 10.0000 mg | ORAL_TABLET | Freq: Four times a day (QID) | ORAL | Status: DC | PRN

## 2015-01-12 NOTE — Progress Notes (Signed)
Subjective:    Crystal Burns is a 34 y.o. female being seen today for her obstetrical visit. She is at [redacted]w[redacted]d gestation. Patient reports backache, no bleeding, no leaking, occasional contractions and states she lost her mucous plug yesturday, and has been having contractions since yesturday with pelvic pressure. Fetal movement: normal.  States that she has been stressed d/t her older 2 daughters getting into trouble/fighting.  States her blood sugars have been elevated upper 190's.    Problem List Items Addressed This Visit    None    Visit Diagnoses    Supervision of high risk pregnancy in third trimester    -  Primary    Relevant Orders    POCT urinalysis dipstick (Completed)    Strep B DNA probe    Pain aggravated by activities of daily living        Relevant Medications    Oxycodone HCl 10 MG TABS      Patient Active Problem List   Diagnosis Date Noted  . Evaluate anatomy not seen on prior sonogram   . [redacted] weeks gestation of pregnancy   . [redacted] weeks gestation of pregnancy   . Encounter for fetal anatomic survey   . Maternal morbid obesity, antepartum (HCC)   . Placenta previa antepartum in second trimester   . Osteoarthritis of right knee 04/22/2014  . Essential hypertension 07/18/2012  . LUMBOSACRAL STRAIN, ACUTE 07/05/2008  . Morbid obesity ([facility]) 02/24/2008  . ASTHMA UNSPECIFIED WITH EXACERBATION 02/24/2008  . HYPERGLYCEMIA, BORDERLINE 02/24/2008   Objective:    BP 135/83 mmHg  Pulse 96  Temp(Src) 97.6 F (36.4 C)  Wt 292 lb (132.45 kg)  LMP 04/29/2014 FHT:  140 BPM  Uterine Size: size greater than dates, unable to determine maternal obesity  Presentation: cephalic   NST: Cat. 1, +accels, no decels, moderate variability. Uterine irritability noted on strip.  Cervix: 0.5cm dilated, posterior, long & soft.  Cephalic presentation, -3 station.    Assessment:    Pregnancy @ [redacted]w[redacted]d weeks   Maternal obesity  GDM  LGA fetus by ultrasound  Plan:     labs reviewed,  problem list updated Consent signed. TDAP offered  Rhogam given for RH negative Pediatrician: discussed. Infant feeding: plans to breastfeed. Maternity leave: N/A. Cigarette smoking: quit at start of pregnancy. Orders Placed This Encounter  Procedures  . Strep B DNA probe  . POCT urinalysis dipstick   Meds ordered this encounter  Medications  . Oxycodone HCl 10 MG TABS    Sig: Take 1 tablet (10 mg total) by mouth every 6 (six) hours as needed.    Dispense:  40 tablet    Refill:  0   Follow up in 1 Week.

## 2015-01-12 NOTE — Telephone Encounter (Signed)
Close encounter 

## 2015-01-13 ENCOUNTER — Telehealth: Payer: Self-pay | Admitting: Obstetrics

## 2015-01-13 NOTE — Telephone Encounter (Signed)
[id number] - Left Patient a VM advising her Induction has been scheduled for [id number] @ 6:30AM @ [facility] per [place]. brm

## 2015-01-13 NOTE — Addendum Note (Signed)
Addended by: Samantha Crimes on: 01/13/2015 08:51 AM   Modules accepted: Orders

## 2015-01-18 ENCOUNTER — Encounter: Payer: Medicaid Other | Admitting: Obstetrics

## 2015-01-19 ENCOUNTER — Encounter: Payer: Medicaid Other | Admitting: Certified Nurse Midwife

## 2015-01-21 ENCOUNTER — Inpatient Hospital Stay (HOSPITAL_COMMUNITY)
Admission: AD | Admit: 2015-01-21 | Discharge: 2015-01-23 | DRG: 781 | Disposition: A | Discharge status: Home / Self Care | Payer: Medicaid Other | Source: Ambulatory Visit | Attending: Obstetrics | Admitting: Obstetrics

## 2015-01-21 ENCOUNTER — Encounter (HOSPITAL_COMMUNITY): Payer: Self-pay | Admitting: *Deleted

## 2015-01-21 DIAGNOSIS — Z87891 Personal history of nicotine dependence: Secondary | ICD-10-CM | POA: Diagnosis not present

## 2015-01-21 DIAGNOSIS — Z833 Family history of diabetes mellitus: Secondary | ICD-10-CM | POA: Diagnosis not present

## 2015-01-21 DIAGNOSIS — R030 Elevated blood-pressure reading, without diagnosis of hypertension: Secondary | ICD-10-CM

## 2015-01-21 DIAGNOSIS — Z3A38 38 weeks gestation of pregnancy: Secondary | ICD-10-CM | POA: Diagnosis not present

## 2015-01-21 DIAGNOSIS — O119 Pre-existing hypertension with pre-eclampsia, unspecified trimester: Secondary | ICD-10-CM | POA: Diagnosis present

## 2015-01-21 DIAGNOSIS — O24419 Gestational diabetes mellitus in pregnancy, unspecified control: Secondary | ICD-10-CM | POA: Diagnosis present

## 2015-01-21 DIAGNOSIS — IMO0001 Reserved for inherently not codable concepts without codable children: Secondary | ICD-10-CM

## 2015-01-21 DIAGNOSIS — O113 Pre-existing hypertension with pre-eclampsia, third trimester: Principal | ICD-10-CM | POA: Diagnosis present

## 2015-01-21 DIAGNOSIS — Z8249 Family history of ischemic heart disease and other diseases of the circulatory system: Secondary | ICD-10-CM | POA: Diagnosis not present

## 2015-01-21 DIAGNOSIS — I10 Essential (primary) hypertension: Secondary | ICD-10-CM

## 2015-01-21 HISTORY — DX: Pre-existing hypertension with pre-eclampsia, unspecified trimester: O11.9

## 2015-01-21 LAB — URINALYSIS, ROUTINE W REFLEX MICROSCOPIC
BILIRUBIN URINE: NEGATIVE
GLUCOSE, UA: NEGATIVE mg/dL
HGB URINE DIPSTICK: NEGATIVE
KETONES UR: NEGATIVE mg/dL
Leukocytes, UA: NEGATIVE
NITRITE: NEGATIVE
PH: 7 (ref 5.0–8.0)
Protein, ur: NEGATIVE mg/dL
Specific Gravity, Urine: 1.02 (ref 1.005–1.030)
Urobilinogen, UA: 1 mg/dL (ref 0.0–1.0)

## 2015-01-21 LAB — CBC
HCT: 30.8 % — ABNORMAL LOW (ref 36.0–46.0)
Hemoglobin: 9.7 g/dL — ABNORMAL LOW (ref 12.0–15.0)
MCH: 24.7 pg — AB (ref 26.0–34.0)
MCHC: 31.5 g/dL (ref 30.0–36.0)
MCV: 78.4 fL (ref 78.0–100.0)
PLATELETS: 241 10*3/uL (ref 150–400)
RBC: 3.93 MIL/uL (ref 3.87–5.11)
RDW: 16 % — AB (ref 11.5–15.5)
WBC: 8.4 10*3/uL (ref 4.0–10.5)

## 2015-01-21 LAB — COMPREHENSIVE METABOLIC PANEL
ALBUMIN: 2.8 g/dL — AB (ref 3.5–5.0)
ALK PHOS: 132 U/L — AB (ref 38–126)
ALT: 10 U/L — ABNORMAL LOW (ref 14–54)
ANION GAP: 3 — AB (ref 5–15)
AST: 12 U/L — ABNORMAL LOW (ref 15–41)
BILIRUBIN TOTAL: 0.8 mg/dL (ref 0.3–1.2)
BUN: 6 mg/dL (ref 6–20)
CALCIUM: 9.1 mg/dL (ref 8.9–10.3)
CO2: 24 mmol/L (ref 22–32)
CREATININE: 0.53 mg/dL (ref 0.44–1.00)
Chloride: 107 mmol/L (ref 101–111)
GFR calc Af Amer: >60 mL/min (ref >60)
GFR calc non Af Amer: >60 mL/min (ref >60)
GLUCOSE: 97 mg/dL (ref 65–99)
Potassium: 3.8 mmol/L (ref 3.5–5.1)
Sodium: 134 mmol/L — ABNORMAL LOW (ref 135–145)
TOTAL PROTEIN: 6.3 g/dL — AB (ref 6.5–8.1)

## 2015-01-21 LAB — TYPE AND SCREEN
ABO/RH(D): O POS
ANTIBODY SCREEN: NEGATIVE

## 2015-01-21 LAB — URIC ACID: Uric Acid, Serum: 6.1 mg/dL (ref 2.3–6.6)

## 2015-01-21 LAB — PROTEIN / CREATININE RATIO, URINE
CREATININE, URINE: 126 mg/dL
Protein Creatinine Ratio: 0.1 mg/mg{Cre} (ref 0.00–0.15)
Total Protein, Urine: 12 mg/dL

## 2015-01-21 LAB — LACTATE DEHYDROGENASE: LDH: 103 U/L (ref 98–192)

## 2015-01-21 MED ORDER — METFORMIN HCL ER 500 MG PO TB24
500.0000 mg | ORAL_TABLET | Freq: Two times a day (BID) | ORAL | Status: DC
  Administered 2015-01-21 – 2015-01-23 (×4): 500 mg via ORAL
  Filled 2015-01-21 (×4): qty 1

## 2015-01-21 MED ORDER — DOCUSATE SODIUM 100 MG PO CAPS
100.0000 mg | ORAL_CAPSULE | Freq: Every day | ORAL | Status: DC
  Administered 2015-01-22: 100 mg via ORAL
  Filled 2015-01-21 (×4): qty 1

## 2015-01-21 MED ORDER — PRENATAL MULTIVITAMIN CH
1.0000 | ORAL_TABLET | Freq: Every day | ORAL | Status: DC
  Filled 2015-01-21 (×3): qty 1

## 2015-01-21 MED ORDER — LABETALOL HCL 100 MG PO TABS: 300.0000 mg | ORAL_TABLET | Freq: Two times a day (BID) | ORAL | Status: DC

## 2015-01-21 MED ORDER — ACETAMINOPHEN 325 MG PO TABS
650.0000 mg | ORAL_TABLET | ORAL | Status: DC | PRN
  Administered 2015-01-22 – 2015-01-23 (×2): 650 mg via ORAL
  Filled 2015-01-21 (×2): qty 2

## 2015-01-21 MED ORDER — ZOLPIDEM TARTRATE 5 MG PO TABS
5.0000 mg | ORAL_TABLET | Freq: Every evening | ORAL | Status: DC | PRN
  Administered 2015-01-22 – 2015-01-23 (×2): 5 mg via ORAL
  Filled 2015-01-21 (×3): qty 1

## 2015-01-21 MED ORDER — LABETALOL HCL 300 MG PO TABS
300.0000 mg | ORAL_TABLET | Freq: Two times a day (BID) | ORAL | Status: DC
  Administered 2015-01-21 – 2015-01-22 (×3): 300 mg via ORAL
  Filled 2015-01-21 (×5): qty 1

## 2015-01-21 MED ORDER — CALCIUM CARBONATE ANTACID 500 MG PO CHEW
2.0000 | CHEWABLE_TABLET | ORAL | Status: DC | PRN
Start: 2015-01-21 — End: 2015-01-23
  Filled 2015-01-21: qty 2

## 2015-01-21 NOTE — MAU Provider Note (Signed)
History     CSN: [id number]  Arrival date and time: 01/21/15 1705   None     No chief complaint on file.  HPI    [name] is a 34 y.o. female [phone] at [redacted]w[redacted]d with a history of chronic hypertension and gestational hypertension presenting to MAU with contractions.  The patient had elevated pressures and new onset + HA in MAU and Dr. Gaynell Face requested [facility] labs.   The patient She takes Labetalol 150 mg PO in the morning and then in the afternoon she would break a pill in half and take 75 mg. This is the dose she would take every day. This is not the dose that is prescribed; she does not take it as prescribed because she does not like the way it makes her feels.   New onset + HA in the last 24 hours.   OB History    Gravida Para Term Preterm AB TAB SAB Ectopic Multiple Living   0 4 0 4 0 0 6      Past Medical History  Diagnosis Date  . History of gonorrhea   . Asthma     2010  . Depression     2010  . Gestational diabetes     2012  . Hypertension     2004    Past Surgical History  Procedure Laterality Date  . Dilation and curettage of uterus    . Cholecystectomy    . Wisdom tooth extraction      Family History  Problem Relation Age of Onset  . Hypertension Mother   . Diabetes Mother   . Cancer Mother   . Hypertension Father   . Diabetes Father   . Hyperlipidemia Father   . Arthritis    . Asthma    . Breast cancer    . Heart failure    . Congenital heart disease    . Depression    . Heart attack      Social History  Substance Use Topics  . Smoking status: Former Smoker    Types: Cigarettes    Quit date: 10/31/2010  . Smokeless tobacco: Never Used  . Alcohol Use: No    Allergies:  Allergies  Allergen Reactions  . Shellfish Allergy Anaphylaxis  . Bee Venom Hives and Swelling    Prescriptions prior to admission  Medication Sig Dispense Refill Last Dose  . clindamycin (CLEOCIN) 300 MG capsule Take 1 capsule (300 mg  total) by mouth 3 (three) times daily. 21 capsule 0 01/20/2015 at Unknown time  . labetalol (NORMODYNE) 300 MG tablet Take 1 tablet (300 mg total) by mouth 2 (two) times daily. (Patient taking differently: Take 150 mg by mouth 2 (two) times daily. ) 60 tablet 11 01/20/2015 at 1900  . metFORMIN (GLUCOPHAGE XR) 500 MG 24 hr tablet Take 2 tablets (1,000 mg total) by mouth 2 (two) times daily after a meal. (Patient taking differently: Take 500 mg by mouth daily. ) 120 tablet 11 01/20/2015 at Unknown time  . ondansetron (ZOFRAN-ODT) 8 MG disintegrating tablet TAKE 1 TABLET (8 MG TOTAL) BY MOUTH EVERY 8 (EIGHT) HOURS AS NEEDED FOR NAUSEA OR VOMITING. 30 tablet 2 Past Week at Unknown time  . Oxycodone HCl 10 MG TABS Take 1 tablet (10 mg total) by mouth every 6 (six) hours as needed. 40 tablet 0 01/20/2015 at Unknown time  . Prenat w/o A-[id number]-[id number]-FA-DHA (CITRANATAL 90 DHA) 90-1 & 300 MG MISC Take 1 tablet by mouth  daily. (Patient not taking: Reported on 12/26/2014) 60 each 6 Not Taking at Unknown time   Results for orders placed or performed during the hospital encounter of 01/21/15 (from the past 24 hour(s))  Protein / creatinine ratio, urine     Status: None   Collection Time: 01/21/15  6:13 PM  Result Value Ref Range   Creatinine, Urine 126.00 mg/dL   Total Protein, Urine 12 mg/dL   Protein Creatinine Ratio 0.10 0.00 - 0.15 mg/mg[Cre]  CBC     Status: Abnormal   Collection Time: 01/21/15  6:40 PM  Result Value Ref Range   WBC 8.4 4.0 - 10.5 K/uL   RBC 3.93 3.87 - 5.11 MIL/uL   Hemoglobin 9.7 (L) 12.0 - 15.0 g/dL   HCT 98.1 (L) 19.1 - 47.8 %   MCV 78.4 78.0 - 100.0 fL   MCH 24.7 (L) 26.0 - 34.0 pg   MCHC 31.5 30.0 - 36.0 g/dL   RDW 29.5 (H) 62.1 - 30.8 %   Platelets 241 150 - 400 K/uL  Lactate dehydrogenase     Status: None   Collection Time: 01/21/15  6:40 PM  Result Value Ref Range   LDH 103 98 - 192 U/L  Uric acid     Status: None   Collection Time: 01/21/15  6:40 PM  Result Value Ref  Range   Uric Acid, Serum 6.1 2.3 - 6.6 mg/dL  Comprehensive metabolic panel     Status: Abnormal   Collection Time: 01/21/15  6:40 PM  Result Value Ref Range   Sodium 134 (L) 135 - 145 mmol/L   Potassium 3.8 3.5 - 5.1 mmol/L   Chloride 107 101 - 111 mmol/L   CO2 24 22 - 32 mmol/L   Glucose, Bld 97 65 - 99 mg/dL   BUN 6 6 - 20 mg/dL   Creatinine, Ser 6.57 0.44 - 1.00 mg/dL   Calcium 9.1 8.9 - 84.6 mg/dL   Total Protein 6.3 (L) 6.5 - 8.1 g/dL   Albumin 2.8 (L) 3.5 - 5.0 g/dL   AST 12 (L) 15 - 41 U/L   ALT 10 (L) 14 - 54 U/L   Alkaline Phosphatase 132 (H) 38 - 126 U/L   Total Bilirubin 0.8 0.3 - 1.2 mg/dL   GFR calc non Af Amer >60 >60 mL/min   GFR calc Af Amer >60 >60 mL/min   Anion gap 3 (L) 5 - 15    Review of Systems  Eyes: Negative for blurred vision.  Cardiovascular: Negative for chest pain and leg swelling.  Gastrointestinal: Negative for abdominal pain.  Neurological: Positive for headaches (since yesterday. ).   Physical Exam   Blood pressure 152/92, pulse 81, temperature 98.3 F (36.8 C), resp. rate 18, last menstrual period 04/29/2014, unknown if currently breastfeeding.   Patient Vitals for the past 24 hrs:  BP Temp Pulse Resp  01/21/15 1930 147/88 mmHg - 80 -  01/21/15 1915 153/95 mmHg - 84 -  01/21/15 1846 152/92 mmHg - 81 -  01/21/15 1831 157/96 mmHg - 85 -  01/21/15 1826 (!) 149/108 mmHg - 102 -  01/21/15 1801 159/86 mmHg - 80 -  01/21/15 1746 155/84 mmHg - 78 -  01/21/15 1731 146/86 mmHg - 87 -  01/21/15 1720 163/98 mmHg 98.3 F (36.8 C) 86 18    Physical Exam  Constitutional: She is oriented to person, place, and time. She appears well-developed and well-nourished. No distress.  HENT:  Head: Normocephalic.  Cardiovascular: Normal rate and  normal heart sounds.   Respiratory: Effort normal and breath sounds normal.  GI: Soft. There is no tenderness.  Genitourinary:  Dilation: Fingertip Effacement (%): 40 Cervical Position: Posterior Station:  Ballotable Presentation: Vertex Exam by:: K.Wilson,RN  Musculoskeletal: Normal range of motion.  Neurological: She is alert and oriented to person, place, and time. She displays abnormal reflex.  Reflex Scores:      Patellar reflexes are 3+ on the right side and 3+ on the left side. Negative clonus   Skin: Skin is warm. She is not diaphoretic.  Psychiatric: Her behavior is normal.   Fetal Tracing: Baseline: 120 bpm  Variability: Moderate  Accelerations: 15x15 Decelerations: none Toco: occasional, irregular   MAU Course  Procedures  None  MDM  PIH labs per Dr. Gaynell FaceMarshall  Discussed labs, BP readings, and new onset HA with Dr. Gaynell FaceMarshall. Admit to Ante per Dr. Gaynell FaceMarshall. Labetalol and metformin per Dr. Gaynell FaceMarshall.   Assessment and Plan   A:  1. Preeclampsia complicating hypertension; super imposed    2. Essential hypertension   3. Gestational diabetes mellitus in third trimester, unspecified diabetic control   4. Elevated BP    P:  Admit to Ante Rn to Call Dr. Gaynell FaceMarshall with further orders Labetalol 300 mg BID PO Metformin 500 mg BID with meals.    Duane LopeJennifer I Rasch, NP 01/21/2015 7:37 PM

## 2015-01-22 LAB — OB RESULTS CONSOLE GBS: GBS: NEGATIVE

## 2015-01-22 LAB — GROUP B STREP BY PCR: Group B strep by PCR: NEGATIVE

## 2015-01-22 NOTE — H&P (Signed)
This is Dr. Francoise CeoBernard Cia Garretson dictating the history and physical on  Crystal  Burns  she's a [age] year old gravida 2811 para 6046 at 6038 [facility] is a patient of Dr. Rexene EdisonH arper was admitted for observation she has a history of hypertension and diabetes and the patient has not been taking her antihypertensive or her diabetic medication as prescribed she came to the hospital because of him she thought her blood pressure was elevated it was noted that over a few hours baby was reactive but her diastolics got up to 98 and her cemented was normal protein creatinine ratio normal but she developed a headache and it was decided should be brought in for observation and treatment since she does not believe in taking medication as prescribed this morning her blood pressures 132/45 respirations 16 pulse 92 afebrile Past medical history history of chronic hypertensive Henton and diabetes Social surgical history negative Social history negative System review negative Physical exam well-developed female not in labor HEENT negative Lungs clear to P&A Heart regular rhythm no murmurs or gallops Breasts negative Abdomen term Pelvic deferred Extremities negative

## 2015-01-23 LAB — GLUCOSE, CAPILLARY: Glucose-Capillary: 90 mg/dL (ref 65–99)

## 2015-01-24 ENCOUNTER — Telehealth (HOSPITAL_COMMUNITY): Payer: Self-pay | Admitting: *Deleted

## 2015-01-24 ENCOUNTER — Encounter (HOSPITAL_COMMUNITY): Payer: Self-pay | Admitting: *Deleted

## 2015-01-24 NOTE — Discharge Summary (Signed)
Physician Discharge Summary  Patient ID: Crystal Burns MRN: [id number] DOB/AGE: Apr 20, 1980 34 y.o.  Admit date: 01/21/2015 Discharge date: 01/24/2015  Admission Diagnoses: 38.3 weeks.  PIH.  Diabetes.  Discharge Diagnoses: Same Active Problems:   Preeclampsia complicating hypertension   Discharged Condition: good  Hospital Course: Admitted with elevated BP and noncompliance with medication.  BP improved and PIH labs were negative.  Discharged home.  Consults: None  Significant Diagnostic Studies: none  Treatments: Bedrest  Discharge Exam: Blood pressure 121/59, pulse 94, temperature 98.1 F (36.7 C), temperature source Oral, resp. rate 18, height 5\' 3"  (1.6 m), weight 292 lb (132.45 kg), last menstrual period 04/29/2014, unknown if currently breastfeeding. General appearance: alert and no distress Resp: clear to auscultation bilaterally Cardio: regular rate and rhythm, S1, S2 normal, no murmur, click, rub or gallop GI: normal findings: soft, non-tender Extremities: extremities normal, atraumatic, no cyanosis or edema  Disposition: 01-Home or Self Care     Medication List    ASK your doctor about these medications        CITRANATAL 90 DHA 90-1 & 300 MG Misc  Take 1 tablet by mouth daily.     clindamycin 300 MG capsule  Commonly known as:  CLEOCIN  Take 1 capsule (300 mg total) by mouth 3 (three) times daily.     labetalol 300 MG tablet  Commonly known as:  NORMODYNE  Take 1 tablet (300 mg total) by mouth 2 (two) times daily.     metFORMIN 500 MG 24 hr tablet  Commonly known as:  GLUCOPHAGE XR  Take 2 tablets (1,000 mg total) by mouth 2 (two) times daily after a meal.     ondansetron 8 MG disintegrating tablet  Commonly known as:  ZOFRAN-ODT  TAKE 1 TABLET (8 MG TOTAL) BY MOUTH EVERY 8 (EIGHT) HOURS AS NEEDED FOR NAUSEA OR VOMITING.     Oxycodone HCl 10 MG Tabs  Take 1 tablet (10 mg total) by mouth every 6 (six) hours as needed.            Follow-up Information    [facility] On 01/27/2015.   Why:  for induction   Contact information:   [address] [id number] [address]c [place] [id number] [phone]      Signed: Kristl Morioka A 01/24/2015, 12:09 AM

## 2015-01-24 NOTE — Telephone Encounter (Signed)
Preadmission screen  

## 2015-01-27 ENCOUNTER — Encounter (HOSPITAL_COMMUNITY): Payer: Self-pay

## 2015-01-27 ENCOUNTER — Inpatient Hospital Stay (HOSPITAL_COMMUNITY)
Admission: RE | Admit: 2015-01-27 | Discharge: 2015-01-29 | DRG: 774 | Disposition: A | Discharge status: Home / Self Care | Payer: Medicaid Other | Source: Ambulatory Visit | Attending: Obstetrics | Admitting: Obstetrics

## 2015-01-27 DIAGNOSIS — Z8261 Family history of arthritis: Secondary | ICD-10-CM

## 2015-01-27 DIAGNOSIS — O134 Gestational [pregnancy-induced] hypertension without significant proteinuria, complicating childbirth: Secondary | ICD-10-CM | POA: Diagnosis present

## 2015-01-27 DIAGNOSIS — Z833 Family history of diabetes mellitus: Secondary | ICD-10-CM

## 2015-01-27 DIAGNOSIS — Z809 Family history of malignant neoplasm, unspecified: Secondary | ICD-10-CM

## 2015-01-27 DIAGNOSIS — Z87891 Personal history of nicotine dependence: Secondary | ICD-10-CM

## 2015-01-27 DIAGNOSIS — IMO0002 Reserved for concepts with insufficient information to code with codable children: Secondary | ICD-10-CM | POA: Diagnosis present

## 2015-01-27 DIAGNOSIS — O0943 Supervision of pregnancy with grand multiparity, third trimester: Secondary | ICD-10-CM

## 2015-01-27 DIAGNOSIS — O2492 Unspecified diabetes mellitus in childbirth: Secondary | ICD-10-CM | POA: Diagnosis present

## 2015-01-27 DIAGNOSIS — Z818 Family history of other mental and behavioral disorders: Secondary | ICD-10-CM | POA: Diagnosis not present

## 2015-01-27 DIAGNOSIS — Z8249 Family history of ischemic heart disease and other diseases of the circulatory system: Secondary | ICD-10-CM | POA: Diagnosis not present

## 2015-01-27 DIAGNOSIS — Z3A39 39 weeks gestation of pregnancy: Secondary | ICD-10-CM | POA: Diagnosis not present

## 2015-01-27 LAB — GLUCOSE, CAPILLARY
GLUCOSE-CAPILLARY: 121 mg/dL — AB (ref 65–99)
GLUCOSE-CAPILLARY: 88 mg/dL (ref 65–99)
Glucose-Capillary: 83 mg/dL (ref 65–99)

## 2015-01-27 LAB — CBC
HEMATOCRIT: 30.9 % — AB (ref 36.0–46.0)
HEMOGLOBIN: 9.8 g/dL — AB (ref 12.0–15.0)
MCH: 24.6 pg — ABNORMAL LOW (ref 26.0–34.0)
MCHC: 31.7 g/dL (ref 30.0–36.0)
MCV: 77.4 fL — ABNORMAL LOW (ref 78.0–100.0)
Platelets: 281 10*3/uL (ref 150–400)
RBC: 3.99 MIL/uL (ref 3.87–5.11)
RDW: 16.2 % — AB (ref 11.5–15.5)
WBC: 9.3 10*3/uL (ref 4.0–10.5)

## 2015-01-27 LAB — RPR: RPR Ser Ql: NONREACTIVE

## 2015-01-27 LAB — TYPE AND SCREEN
ABO/RH(D): O POS
ANTIBODY SCREEN: NEGATIVE

## 2015-01-27 MED ORDER — NALBUPHINE HCL 10 MG/ML IJ SOLN
10.0000 mg | Freq: Once | INTRAMUSCULAR | Status: AC
  Administered 2015-01-27: 10 mg via INTRAVENOUS
  Filled 2015-01-27 (×2): qty 1

## 2015-01-27 MED ORDER — LANOLIN HYDROUS EX OINT: TOPICAL_OINTMENT | CUTANEOUS | Status: DC | PRN

## 2015-01-27 MED ORDER — WITCH HAZEL-GLYCERIN EX PADS: 1.0000 "application " | MEDICATED_PAD | CUTANEOUS | Status: DC | PRN

## 2015-01-27 MED ORDER — ONDANSETRON HCL 4 MG/2ML IJ SOLN: 4.0000 mg | INTRAMUSCULAR | Status: DC | PRN

## 2015-01-27 MED ORDER — FERROUS SULFATE 325 (65 FE) MG PO TABS
325.0000 mg | ORAL_TABLET | Freq: Three times a day (TID) | ORAL | Status: DC
  Filled 2015-01-27 (×3): qty 1

## 2015-01-27 MED ORDER — LACTATED RINGERS IV SOLN: 500.0000 mL | INTRAVENOUS | Status: DC | PRN

## 2015-01-27 MED ORDER — OXYTOCIN 40 UNITS IN LACTATED RINGERS INFUSION - SIMPLE MED: 62.5000 mL/h | INTRAVENOUS | Status: DC

## 2015-01-27 MED ORDER — CITRIC ACID-SODIUM CITRATE 334-500 MG/5ML PO SOLN: 30.0000 mL | ORAL | Status: DC | PRN

## 2015-01-27 MED ORDER — ACETAMINOPHEN 325 MG PO TABS: 650.0000 mg | ORAL_TABLET | ORAL | Status: DC | PRN

## 2015-01-27 MED ORDER — OXYCODONE-ACETAMINOPHEN 5-325 MG PO TABS: 1.0000 | ORAL_TABLET | ORAL | Status: DC | PRN

## 2015-01-27 MED ORDER — SIMETHICONE 80 MG PO CHEW: 80.0000 mg | CHEWABLE_TABLET | ORAL | Status: DC | PRN

## 2015-01-27 MED ORDER — TERBUTALINE SULFATE 1 MG/ML IJ SOLN
0.2500 mg | Freq: Once | INTRAMUSCULAR | Status: DC | PRN
  Filled 2015-01-27: qty 1

## 2015-01-27 MED ORDER — NALOXONE HCL 0.4 MG/ML IJ SOLN
INTRAMUSCULAR | Status: AC
  Filled 2015-01-27: qty 1

## 2015-01-27 MED ORDER — OXYCODONE-ACETAMINOPHEN 5-325 MG PO TABS
2.0000 | ORAL_TABLET | ORAL | Status: DC | PRN
  Administered 2015-01-28: 2 via ORAL
  Filled 2015-01-27: qty 2

## 2015-01-27 MED ORDER — FLEET ENEMA 7-19 GM/118ML RE ENEM: 1.0000 | ENEMA | Freq: Once | RECTAL | Status: DC

## 2015-01-27 MED ORDER — EPHEDRINE 5 MG/ML INJ
10.0000 mg | INTRAVENOUS | Status: DC | PRN
  Filled 2015-01-27: qty 2

## 2015-01-27 MED ORDER — DIPHENHYDRAMINE HCL 25 MG PO CAPS: 25.0000 mg | ORAL_CAPSULE | Freq: Four times a day (QID) | ORAL | Status: DC | PRN

## 2015-01-27 MED ORDER — OXYTOCIN 40 UNITS IN LACTATED RINGERS INFUSION - SIMPLE MED
1.0000 m[IU]/min | INTRAVENOUS | Status: DC
  Administered 2015-01-27: 2 m[IU]/min via INTRAVENOUS
  Filled 2015-01-27: qty 1000

## 2015-01-27 MED ORDER — ONDANSETRON HCL 4 MG PO TABS: 4.0000 mg | ORAL_TABLET | ORAL | Status: DC | PRN

## 2015-01-27 MED ORDER — PRENATAL MULTIVITAMIN CH
1.0000 | ORAL_TABLET | Freq: Every day | ORAL | Status: DC
  Administered 2015-01-28: 1 via ORAL
  Filled 2015-01-27: qty 1

## 2015-01-27 MED ORDER — BENZOCAINE-MENTHOL 20-0.5 % EX AERO: 1.0000 "application " | INHALATION_SPRAY | CUTANEOUS | Status: DC | PRN

## 2015-01-27 MED ORDER — ZOLPIDEM TARTRATE 5 MG PO TABS: 5.0000 mg | ORAL_TABLET | Freq: Every evening | ORAL | Status: DC | PRN

## 2015-01-27 MED ORDER — PRENATAL MULTIVITAMIN CH: 1.0000 | ORAL_TABLET | Freq: Every day | ORAL | Status: DC

## 2015-01-27 MED ORDER — DIBUCAINE 1 % RE OINT: 1.0000 "application " | TOPICAL_OINTMENT | RECTAL | Status: DC | PRN

## 2015-01-27 MED ORDER — SENNOSIDES-DOCUSATE SODIUM 8.6-50 MG PO TABS
2.0000 | ORAL_TABLET | ORAL | Status: DC
  Administered 2015-01-27 – 2015-01-29 (×2): 2 via ORAL
  Filled 2015-01-27 (×2): qty 2

## 2015-01-27 MED ORDER — PHENYLEPHRINE 40 MCG/ML (10ML) SYRINGE FOR IV PUSH (FOR BLOOD PRESSURE SUPPORT)
80.0000 ug | PREFILLED_SYRINGE | INTRAVENOUS | Status: DC | PRN
  Filled 2015-01-27: qty 2

## 2015-01-27 MED ORDER — TETANUS-DIPHTH-ACELL PERTUSSIS 5-2.5-18.5 LF-MCG/0.5 IM SUSP: 0.5000 mL | Freq: Once | INTRAMUSCULAR | Status: DC

## 2015-01-27 MED ORDER — PROMETHAZINE HCL 25 MG/ML IJ SOLN
25.0000 mg | Freq: Once | INTRAMUSCULAR | Status: AC
  Administered 2015-01-27: 25 mg via INTRAMUSCULAR
  Filled 2015-01-27: qty 1

## 2015-01-27 MED ORDER — LIDOCAINE HCL (PF) 1 % IJ SOLN
30.0000 mL | INTRAMUSCULAR | Status: DC | PRN
  Filled 2015-01-27: qty 30

## 2015-01-27 MED ORDER — FENTANYL 2.5 MCG/ML BUPIVACAINE 1/10 % EPIDURAL INFUSION (WH - ANES): 14.0000 mL/h | INTRAMUSCULAR | Status: DC | PRN

## 2015-01-27 MED ORDER — SENNOSIDES-DOCUSATE SODIUM 8.6-50 MG PO TABS: 2.0000 | ORAL_TABLET | ORAL | Status: DC

## 2015-01-27 MED ORDER — OXYCODONE-ACETAMINOPHEN 5-325 MG PO TABS: 2.0000 | ORAL_TABLET | ORAL | Status: DC | PRN

## 2015-01-27 MED ORDER — NALBUPHINE HCL 10 MG/ML IJ SOLN
10.0000 mg | Freq: Once | INTRAMUSCULAR | Status: AC
  Administered 2015-01-27: 10 mg via INTRAMUSCULAR
  Filled 2015-01-27: qty 1

## 2015-01-27 MED ORDER — LORAZEPAM 2 MG/ML IJ SOLN
1.0000 mg | Freq: Once | INTRAMUSCULAR | Status: AC
  Administered 2015-01-27: 1 mg via INTRAVENOUS

## 2015-01-27 MED ORDER — FENTANYL CITRATE (PF) 100 MCG/2ML IJ SOLN
100.0000 ug | INTRAMUSCULAR | Status: DC | PRN
  Administered 2015-01-27: 100 ug via INTRAVENOUS
  Filled 2015-01-27: qty 2

## 2015-01-27 MED ORDER — IBUPROFEN 600 MG PO TABS: 600.0000 mg | ORAL_TABLET | Freq: Four times a day (QID) | ORAL | Status: DC

## 2015-01-27 MED ORDER — DIPHENHYDRAMINE HCL 50 MG/ML IJ SOLN: 12.5000 mg | INTRAMUSCULAR | Status: DC | PRN

## 2015-01-27 MED ORDER — ONDANSETRON HCL 4 MG/2ML IJ SOLN: 4.0000 mg | Freq: Four times a day (QID) | INTRAMUSCULAR | Status: DC | PRN

## 2015-01-27 MED ORDER — ACETAMINOPHEN 325 MG PO TABS
650.0000 mg | ORAL_TABLET | ORAL | Status: DC | PRN
Start: 2015-01-27 — End: 2015-01-27

## 2015-01-27 MED ORDER — OXYTOCIN BOLUS FROM INFUSION: 500.0000 mL | INTRAVENOUS | Status: DC

## 2015-01-27 MED ORDER — IBUPROFEN 600 MG PO TABS
600.0000 mg | ORAL_TABLET | Freq: Four times a day (QID) | ORAL | Status: DC
  Administered 2015-01-27 – 2015-01-29 (×6): 600 mg via ORAL
  Filled 2015-01-27 (×7): qty 1

## 2015-01-27 MED ORDER — LACTATED RINGERS IV SOLN
INTRAVENOUS | Status: DC
  Administered 2015-01-27 (×2): via INTRAVENOUS

## 2015-01-27 MED ORDER — OXYTOCIN 40 UNITS IN LACTATED RINGERS INFUSION - SIMPLE MED: 62.5000 mL/h | INTRAVENOUS | Status: DC | PRN

## 2015-01-27 MED ORDER — MISOPROSTOL 200 MCG PO TABS
50.0000 ug | ORAL_TABLET | ORAL | Status: DC
  Administered 2015-01-27: 50 ug via ORAL
  Filled 2015-01-27: qty 0.5

## 2015-01-27 MED ORDER — BENZOCAINE-MENTHOL 20-0.5 % EX AERO
1.0000 "application " | INHALATION_SPRAY | CUTANEOUS | Status: DC | PRN
  Administered 2015-01-27: 1 via TOPICAL
  Filled 2015-01-27: qty 56

## 2015-01-27 MED ORDER — OXYCODONE-ACETAMINOPHEN 5-325 MG PO TABS
1.0000 | ORAL_TABLET | ORAL | Status: DC | PRN
  Administered 2015-01-28 – 2015-01-29 (×2): 1 via ORAL
  Filled 2015-01-27 (×4): qty 1

## 2015-01-27 NOTE — Progress Notes (Signed)
I received report that pt had had a baby die of SIDs in recent years and that she had some other significant losses in her life recently.  Pt was in early stages of labor and our visit was short.  She requested prayer for a safe delivery.  After our prayer, I let her know of our ongoing availability.  Chaplain Dyanne CarrelKaty Nilton Lave, Bcc Pager, [phone] 1:19 PM    01/27/15 1300  Clinical Encounter Type  Visited With Patient and family together  Visit Type Spiritual support  Referral From (Rounds)

## 2015-01-27 NOTE — H&P (Signed)
Crystal Burns is a 34 y.o. female presenting for IOL with hx of GDM and hypertension.  Takes PO Metformin and Labetalol.  Patient has a fear of needles.  History OB History    Gravida Para Term Preterm AB TAB SAB Ectopic Multiple Living   11 6 6  0 4 0 4 0 0 6     Past Medical History  Diagnosis Date  . History of gonorrhea   . Gestational diabetes     2012  . Hypertension     2004  . Asthma     2010-only bothers pt during bad cold  . Depression     2010-had when mother passed away  . Pregnancy induced hypertension    Past Surgical History  Procedure Laterality Date  . Dilation and curettage of uterus    . Cholecystectomy    . Wisdom tooth extraction     Family History: family history includes Arthritis in an other family member; Asthma in an other family member; Breast cancer in an other family member; Cancer in her mother; Congenital heart disease in an other family member; Depression in an other family member; Diabetes in her father and mother; Heart attack in an other family member; Heart failure in an other family member; Hyperlipidemia in her father; Hypertension in her father and mother. Social History:  reports that she quit smoking about 4 years ago. Her smoking use included Cigarettes. She has never used smokeless tobacco. She reports that she does not drink alcohol or use illicit drugs.   Prenatal Transfer Tool  Maternal Diabetes: Yes:  Diabetes Type:  Insulin/Medication controlled Genetic Screening: Normal Maternal Ultrasounds/Referrals: Normal Fetal Ultrasounds or other Referrals:  Referred to Materal Fetal Medicine  Maternal Substance Abuse:  No Significant Maternal Medications:  Meds include: Other: Metformin, Labetalol Significant Maternal Lab Results:  None Other Comments:  None  ROS  Dilation: 1 Effacement (%): 40 Station: -2 Exam by:: Rachele CNM Blood pressure 126/64, pulse 87, temperature 97.7 F (36.5 C), temperature source Oral, resp. rate  18, height 5\' 3"  (1.6 m), weight 296 lb (134.265 kg), last menstrual period 04/29/2014, unknown if currently breastfeeding. Exam Physical Exam  Prenatal labs: ABO, Rh: --/--/O POS (10/15 2055) Antibody: NEG (10/15 2055) Rubella: 2.39 (03/24 1100) RPR: NON REAC (07/13 1344)  HBsAg: NEGATIVE (03/24 1100)  HIV: NONREACTIVE (07/13 1344)  GBS: Negative (10/16 0331)   Assessment/Plan: Admit for IOL at term for GDM and HTN.  Foley bulb and PO cytotec.  IV pain medications in active labor.     Roe Coombsachelle A Denney, CNM 01/27/2015, 8:52 AM

## 2015-01-27 NOTE — H&P (Signed)
Crystal Burns is a 34 y.o. female presenting for elective IOL.  Multiparity with favorable cervix. Maternal Medical History:  Reason for admission: Elective IOL.  Fetal activity: Perceived fetal activity is normal.   Last perceived fetal movement was within the past hour.    Prenatal complications: no prenatal complications   OB History    Gravida Para Term Preterm AB TAB SAB Ectopic Multiple Living   11 6 6  0 4 0 4 0 0 6     Past Medical History  Diagnosis Date  . History of gonorrhea   . Gestational diabetes     2012  . Hypertension     2004  . Asthma     2010-only bothers pt during bad cold  . Depression     2010-had when mother passed away  . Pregnancy induced hypertension    Past Surgical History  Procedure Laterality Date  . Dilation and curettage of uterus    . Cholecystectomy    . Wisdom tooth extraction     Family History: family history includes Arthritis in an other family member; Asthma in an other family member; Breast cancer in an other family member; Cancer in her mother; Congenital heart disease in an other family member; Depression in an other family member; Diabetes in her father and mother; Heart attack in an other family member; Heart failure in an other family member; Hyperlipidemia in her father; Hypertension in her father and mother. Social History:  reports that she quit smoking about 4 years ago. Her smoking use included Cigarettes. She has never used smokeless tobacco. She reports that she does not drink alcohol or use illicit drugs.   Prenatal Transfer Tool  Maternal Diabetes: No Genetic Screening: Normal Maternal Ultrasounds/Referrals: Normal Fetal Ultrasounds or other Referrals:  None Maternal Substance Abuse:  No Significant Maternal Medications:  None Significant Maternal Lab Results:  None Other Comments:  None  Review of Systems  All other systems reviewed and are negative.   Dilation: 1 Effacement (%): 40 Station: -2 Exam  by:: Rachele CNM Blood pressure 126/64, pulse 87, temperature 97.7 F (36.5 C), temperature source Oral, resp. rate 18, height 5\' 3"  (1.6 m), weight 296 lb (134.265 kg), last menstrual period 04/29/2014, unknown if currently breastfeeding. Maternal Exam:  Abdomen: Patient reports no abdominal tenderness. Pelvis: adequate for delivery.   Cervix: Cervix evaluated by digital exam.     Physical Exam  Nursing note and vitals reviewed. Constitutional: She is oriented to person, place, and time. She appears well-developed and well-nourished.  HENT:  Head: Normocephalic and atraumatic.  Eyes: Conjunctivae are normal. Pupils are equal, round, and reactive to light.  Neck: Normal range of motion. Neck supple.  Cardiovascular: Normal rate and regular rhythm.   Respiratory: Effort normal and breath sounds normal.  GI: Soft.  Genitourinary: Vagina normal and uterus normal.  Musculoskeletal: Normal range of motion.  Neurological: She is alert and oriented to person, place, and time.  Skin: Skin is warm and dry.  Psychiatric: She has a normal mood and affect. Her behavior is normal. Judgment and thought content normal.    Prenatal labs: ABO, Rh: --/--/O POS (10/15 2055) Antibody: NEG (10/15 2055) Rubella: 2.39 (03/24 1100) RPR: NON REAC (07/13 1344)  HBsAg: NEGATIVE (03/24 1100)  HIV: NONREACTIVE (07/13 1344)  GBS: Negative (10/16 0331)   Assessment/Plan: 39 weeks.  Multiparity.  Favorable cervix,  Elective IOL.   Esli Jernigan A 01/27/2015, 8:37 AM

## 2015-01-28 ENCOUNTER — Encounter (HOSPITAL_COMMUNITY): Payer: Self-pay

## 2015-01-28 LAB — CBC
HEMATOCRIT: 29.1 % — AB (ref 36.0–46.0)
HEMOGLOBIN: 9.2 g/dL — AB (ref 12.0–15.0)
MCH: 24.7 pg — AB (ref 26.0–34.0)
MCHC: 31.6 g/dL (ref 30.0–36.0)
MCV: 78 fL (ref 78.0–100.0)
PLATELETS: 240 10*3/uL (ref 150–400)
RBC: 3.73 MIL/uL — AB (ref 3.87–5.11)
RDW: 16.1 % — ABNORMAL HIGH (ref 11.5–15.5)
WBC: 11.2 10*3/uL — AB (ref 4.0–10.5)

## 2015-01-28 NOTE — Lactation Note (Signed)
This note was copied from the chart of Girl Ellen Niemeier. Lactation Consultation Note  Patient Name: Girl Claud KelpLaquetha Kynard JXBJY'NToday's Date: 01/28/2015   Mom and baby were sleeping, [facility] Left hospital phone for mom to page at next feeding.   Maternal Data    Feeding  LATCH Score/Interventions                      Lactation Tools Discussed/Used     Consult Status      Rulon Eisenmengerlizabeth E Liahna Brickner 01/28/2015, 3:25 PM

## 2015-01-28 NOTE — Lactation Note (Signed)
This note was copied from the chart of Crystal Burns. Lactation Consultation Note  Patient Name: Crystal Claud KelpLaquetha Myles ZOXWR'UToday's Date: 01/28/2015 Reason for consult: Initial assessment Baby at 3424 hr old, experienced bf mom. Mom reports that baby is eating well but she would like help finding a more comfortable position to feed the baby. Tried the baby in cradle but mom does not have good head control, baby did latch for a little while but seemed really far from the breast. Latched baby in football on the L, baby did well and was much closer to the breast but mom stated that she felt like she was squishing the baby. She knows how to manually express, colostrum seen bilaterally. Went over breast changes, nipple care, and milk transition. She will continue to bf on demand, start pumping for return to work after she gets home, and call lactation as needed. She is aware of O/P services and support group.      Maternal Data Has patient been taught Hand Expression?: Yes Does the patient have breastfeeding experience prior to this delivery?: Yes  Feeding Feeding Type: Breast Fed Length of feed: 10 min  LATCH Score/Interventions Latch: Grasps breast easily, tongue down, lips flanged, rhythmical sucking.  Audible Swallowing: Spontaneous and intermittent  Type of Nipple: Everted at rest and after stimulation  Comfort (Breast/Nipple): Soft / non-tender     Hold (Positioning): Assistance needed to correctly position infant at breast and maintain latch. Intervention(s): Support Pillows;Position options  LATCH Score: 9  Lactation Tools Discussed/Used WIC Program: Yes   Consult Status Consult Status: Follow-up Date: 01/29/15 Follow-up type: In-patient    Rulon Eisenmengerlizabeth E Adelaine Roppolo 01/28/2015, 6:15 PM

## 2015-01-28 NOTE — Progress Notes (Signed)
Post Partum Day 1 Subjective: no complaints  Objective: Blood pressure 117/69, pulse 89, temperature 98.3 F (36.8 C), temperature source Oral, resp. rate 20, height 5\' 3"  (1.6 m), weight 296 lb (134.265 kg), last menstrual period 04/29/2014, SpO2 99 %, unknown if currently breastfeeding.  Physical Exam:  General: alert and no distress Lochia: appropriate Uterine Fundus: firm Incision: none DVT Evaluation: No evidence of DVT seen on physical exam.   Recent Labs  01/27/15 0755 01/28/15 0533  HGB 9.8* 9.2*  HCT 30.9* 29.1*    Assessment/Plan: Plan for discharge tomorrow   LOS: 1 day   Niamya Vittitow A 01/28/2015, 12:26 PM

## 2015-01-29 ENCOUNTER — Ambulatory Visit: Payer: Self-pay

## 2015-01-29 MED ORDER — OXYCODONE-ACETAMINOPHEN 5-325 MG PO TABS: 1.0000 | ORAL_TABLET | ORAL | Status: DC | PRN

## 2015-01-29 MED ORDER — IBUPROFEN 600 MG PO TABS: 600.0000 mg | ORAL_TABLET | Freq: Four times a day (QID) | ORAL | Status: DC | PRN

## 2015-01-29 MED ORDER — FUSION PLUS PO CAPS: 1.0000 | ORAL_CAPSULE | Freq: Every day | ORAL | Status: DC

## 2015-01-29 NOTE — Lactation Note (Signed)
This note was copied from the chart of Girl Chaslyn Iyengar. Lactation Consultation Note  Patient Name: Girl Wilmoth Rasnic VACQP'E Date: 01/29/2015 Reason for consult: Follow-up assessment Baby 2 hours old, this LC met with mom at 87 this am. Mom nursing baby when this LC entered room, baby latched deeply, suckling rhythmically with intermittent swallows noted. Mom states that baby is cluster-feeding. Mom has no concerns at this time. Mom aware of OP/BFSG and [place] phone line assistance after D/C.   Maternal Data    Feeding    LATCH Score/Interventions                      Lactation Tools Discussed/Used     Consult Status Consult Status: Complete    Inocente Salles 01/29/2015, 12:04 PM

## 2015-01-29 NOTE — Discharge Summary (Signed)
Obstetric Discharge Summary Reason for Admission: induction of labor Prenatal Procedures: ultrasound Intrapartum Procedures: spontaneous vaginal delivery Postpartum Procedures: none Complications-Operative and Postpartum: none HEMOGLOBIN  Date Value Ref Range Status  01/28/2015 9.2* 12.0 - 15.0 g/dL Final   HCT  Date Value Ref Range Status  01/28/2015 29.1* 36.0 - 46.0 % Final    Physical Exam:  General: alert and no distress Lochia: appropriate Uterine Fundus: firm Incision: none DVT Evaluation: No evidence of DVT seen on physical exam.  Discharge Diagnoses: Term Pregnancy-delivered  Discharge Information: Date: 01/29/2015 Activity: pelvic rest Diet: routine Medications: PNV, Ibuprofen, Colace and Percocet Condition: stable Instructions: refer to practice specific booklet Discharge to: home Follow-up Information    Follow up with Lamona Eimer A, MD. Schedule an appointment as soon as possible for a visit in 2 weeks.   Specialty:  Obstetrics and Gynecology   Contact information:   [address] [place] [address] [phone]       Newborn Data: Live born female  Birth Weight: 7 lb 4.1 oz (3290 g) APGAR: 8, 9  Home with mother.  Brendia Dampier A 01/29/2015, 6:23 AM

## 2015-01-29 NOTE — Progress Notes (Signed)
Post Partum Day 2 Subjective: no complaints  Objective: Blood pressure 137/84, pulse 90, temperature 98.2 F (36.8 C), temperature source Oral, resp. rate 18, height 5\' 3"  (1.6 m), weight 296 lb (134.265 kg), last menstrual period 04/29/2014, SpO2 99 %, unknown if currently breastfeeding.  Physical Exam:  General: alert and no distress Lochia: appropriate Uterine Fundus: firm Incision: none DVT Evaluation: No evidence of DVT seen on physical exam.   Recent Labs  01/27/15 0755 01/28/15 0533  HGB 9.8* 9.2*  HCT 30.9* 29.1*    Assessment/Plan: Discharge home   LOS: 2 days   Emerson Schreifels A 01/29/2015, 6:18 AM

## 2015-01-29 NOTE — Progress Notes (Signed)
Spoke with RN and informed that mother declined CSW consult.

## 2015-01-31 ENCOUNTER — Encounter: Payer: Self-pay | Admitting: *Deleted

## 2015-01-31 ENCOUNTER — Encounter: Payer: Self-pay | Admitting: Obstetrics

## 2015-01-31 ENCOUNTER — Ambulatory Visit (INDEPENDENT_AMBULATORY_CARE_PROVIDER_SITE_OTHER): Payer: Medicaid Other | Admitting: Obstetrics

## 2015-01-31 DIAGNOSIS — I10 Essential (primary) hypertension: Secondary | ICD-10-CM

## 2015-01-31 MED ORDER — HYDROCHLOROTHIAZIDE 25 MG PO TABS: 25.0000 mg | ORAL_TABLET | Freq: Every morning | ORAL | Status: DC

## 2015-01-31 NOTE — Addendum Note (Signed)
Addended by: Elby BeckPAUL, Jerrit Horen F on: 01/31/2015 05:08 PM   Modules accepted: Orders

## 2015-01-31 NOTE — Progress Notes (Signed)
Subjective:     Crystal Burns is a 34 y.o. female who presents for a postpartum visit. She is 4 days postpartum following a spontaneous vaginal delivery. I have fully reviewed the prenatal and intrapartum course. The delivery was at 39 gestational weeks. Outcome: spontaneous vaginal delivery. Anesthesia: none. Postpartum course has been normal. Baby's course has been normal. Baby is feeding by breast. Bleeding thin lochia. Bowel function is normal. Bladder function is normal. Patient is not sexually active. Contraception method is abstinence. Postpartum depression screening: negative.  Tobacco, alcohol and substance abuse history reviewed.  Adult immunizations reviewed including TDAP, rubella and varicella.  The following portions of the patient's history were reviewed and updated as appropriate: allergies, current medications, past family history, past medical history, past social history, past surgical history and problem list.  Review of Systems A comprehensive review of systems was negative.   Objective:    BP 138/94 mmHg  Pulse 86  Wt 285 lb (129.275 kg)   PE:      General:  Alert and no distress      Abdomen:  Soft, NT      Extremities:  No cyanosis, clubbing or edema    Assessment:   Four days postpartum.  Mildly elevated BP.  Stable.   Plan:    1. Contraception: tubal ligation 2. Continue Labetalol.  HCTZ added 3. Follow up in: 5 days or as needed.   Healthy lifestyle practices reviewed

## 2015-02-03 ENCOUNTER — Ambulatory Visit: Payer: Medicaid Other

## 2015-02-06 ENCOUNTER — Encounter (HOSPITAL_COMMUNITY): Payer: Self-pay | Admitting: *Deleted

## 2015-02-06 ENCOUNTER — Inpatient Hospital Stay (HOSPITAL_COMMUNITY)
Admission: AD | Admit: 2015-02-06 | Discharge: 2015-02-06 | Disposition: A | Discharge status: Home / Self Care | Payer: Medicaid Other | Source: Ambulatory Visit | Attending: Obstetrics | Admitting: Obstetrics

## 2015-02-06 DIAGNOSIS — O1093 Unspecified pre-existing hypertension complicating the puerperium: Secondary | ICD-10-CM | POA: Diagnosis not present

## 2015-02-06 DIAGNOSIS — O165 Unspecified maternal hypertension, complicating the puerperium: Secondary | ICD-10-CM | POA: Diagnosis not present

## 2015-02-06 DIAGNOSIS — Z87891 Personal history of nicotine dependence: Secondary | ICD-10-CM | POA: Insufficient documentation

## 2015-02-06 LAB — COMPREHENSIVE METABOLIC PANEL
ALT: 31 U/L (ref 14–54)
AST: 18 U/L (ref 15–41)
Albumin: 3.2 g/dL — ABNORMAL LOW (ref 3.5–5.0)
Alkaline Phosphatase: 123 U/L (ref 38–126)
Anion gap: 4 — ABNORMAL LOW (ref 5–15)
BILIRUBIN TOTAL: 0.3 mg/dL (ref 0.3–1.2)
BUN: 13 mg/dL (ref 6–20)
CHLORIDE: 107 mmol/L (ref 101–111)
CO2: 27 mmol/L (ref 22–32)
Calcium: 9.2 mg/dL (ref 8.9–10.3)
Creatinine, Ser: 0.66 mg/dL (ref 0.44–1.00)
Glucose, Bld: 90 mg/dL (ref 65–99)
POTASSIUM: 3.6 mmol/L (ref 3.5–5.1)
Sodium: 138 mmol/L (ref 135–145)
TOTAL PROTEIN: 6.6 g/dL (ref 6.5–8.1)

## 2015-02-06 LAB — CBC
HEMATOCRIT: 30.7 % — AB (ref 36.0–46.0)
Hemoglobin: 9.5 g/dL — ABNORMAL LOW (ref 12.0–15.0)
MCH: 24.4 pg — AB (ref 26.0–34.0)
MCHC: 30.9 g/dL (ref 30.0–36.0)
MCV: 78.9 fL (ref 78.0–100.0)
PLATELETS: 311 10*3/uL (ref 150–400)
RBC: 3.89 MIL/uL (ref 3.87–5.11)
RDW: 15.7 % — AB (ref 11.5–15.5)
WBC: 8.6 10*3/uL (ref 4.0–10.5)

## 2015-02-06 LAB — URINE MICROSCOPIC-ADD ON

## 2015-02-06 LAB — URINALYSIS, ROUTINE W REFLEX MICROSCOPIC
BILIRUBIN URINE: NEGATIVE
GLUCOSE, UA: NEGATIVE mg/dL
Ketones, ur: NEGATIVE mg/dL
Nitrite: NEGATIVE
PH: 5.5 (ref 5.0–8.0)
Protein, ur: NEGATIVE mg/dL
SPECIFIC GRAVITY, URINE: 1.01 (ref 1.005–1.030)
UROBILINOGEN UA: 0.2 mg/dL (ref 0.0–1.0)

## 2015-02-06 LAB — URIC ACID: Uric Acid, Serum: 9.7 mg/dL — ABNORMAL HIGH (ref 2.3–6.6)

## 2015-02-06 LAB — LACTATE DEHYDROGENASE: LDH: 196 U/L — AB (ref 98–192)

## 2015-02-06 LAB — PROTEIN / CREATININE RATIO, URINE
Creatinine, Urine: 41 mg/dL
Protein Creatinine Ratio: 0.24 mg/mg{Cre} — ABNORMAL HIGH (ref 0.00–0.15)
TOTAL PROTEIN, URINE: 10 mg/dL

## 2015-02-06 MED ORDER — HYDROCHLOROTHIAZIDE 50 MG PO TABS
50.0000 mg | ORAL_TABLET | Freq: Every day | ORAL | Status: DC
Start: 2015-02-06 — End: 2015-04-29

## 2015-02-06 MED ORDER — HYDRALAZINE HCL 20 MG/ML IJ SOLN: 10.0000 mg | Freq: Once | INTRAMUSCULAR | Status: DC | PRN

## 2015-02-06 MED ORDER — LABETALOL HCL 5 MG/ML IV SOLN: 20.0000 mg | INTRAVENOUS | Status: DC | PRN

## 2015-02-06 NOTE — MAU Note (Signed)
Pt delivered on 01/27/2015. [facility]/105 and then 185/111. Takes labetalol and HCTZ. Had HA yesterday, but not currently and denies vision changes. No other concerns at this time.

## 2015-02-06 NOTE — MAU Provider Note (Signed)
History     CSN: [id number]  Arrival date and time: 02/06/15 4098   First Provider Initiated Contact with Patient 02/06/15 0116         Chief Complaint  Patient presents with  . Hypertension  . Postpartum Complications   HPI Crystal Burns is a 34 y.o. [id number] at 10 days postpartum who presents with hypertension.  Has had headache for several days. Took BP at [facility] tonight - BPs were 180s/100s.  Had headache earlier today that resolved with nap & tylenol.  Denies vision changes, n/v, epigastric pain, chest pain, or SOB. Currently no headache.  Taked HCTZ & labetalol BID for chronic HTN.     OB History    Gravida Para Term Preterm AB TAB SAB Ectopic Multiple Living   0 4 0 4 0 0 6      Past Medical History  Diagnosis Date  . History of gonorrhea   . Gestational diabetes     2012  . Hypertension     2004  . Asthma     2010-only bothers pt during bad cold  . Depression     2010-had when mother passed away  . Pregnancy induced hypertension     Past Surgical History  Procedure Laterality Date  . Dilation and curettage of uterus    . Cholecystectomy    . Wisdom tooth extraction      Family History  Problem Relation Age of Onset  . Hypertension Mother   . Diabetes Mother   . Cancer Mother   . Hypertension Father   . Diabetes Father   . Hyperlipidemia Father   . Arthritis    . Asthma    . Breast cancer    . Heart failure    . Congenital heart disease    . Depression    . Heart attack      Social History  Substance Use Topics  . Smoking status: Former Smoker    Types: Cigarettes    Quit date: 10/31/2010  . Smokeless tobacco: Never Used  . Alcohol Use: No    Allergies:  Allergies  Allergen Reactions  . Shellfish Allergy Anaphylaxis  . Bee Venom Hives and Swelling    Prescriptions prior to admission  Medication Sig Dispense Refill Last Dose  . hydrochlorothiazide (HYDRODIURIL) 25 MG tablet Take 1 tablet (25 mg total) by mouth  every morning. 30 tablet 5 02/05/2015 at Unknown time  . ibuprofen (ADVIL,MOTRIN) 600 MG tablet Take 1 tablet (600 mg total) by mouth every 6 (six) hours as needed for mild pain. 30 tablet 5 02/05/2015 at Unknown time  . Iron-FA-B Cmp-C-Biot-Probiotic (FUSION PLUS) CAPS Take 1 capsule by mouth daily before breakfast. 30 capsule 5 02/05/2015 at Unknown time  . labetalol (NORMODYNE) 300 MG tablet Take 300 mg by mouth 2 (two) times daily.   02/05/2015 at 2200  . ondansetron (ZOFRAN-ODT) 8 MG disintegrating tablet TAKE 1 TABLET (8 MG TOTAL) BY MOUTH EVERY 8 (EIGHT) HOURS AS NEEDED FOR NAUSEA OR VOMITING. 30 tablet 2 Past Month at Unknown time  . Oxycodone HCl 10 MG TABS Take 1 tablet (10 mg total) by mouth every 6 (six) hours as needed. 40 tablet 0 Past Month at Unknown time  . oxyCODONE-acetaminophen (PERCOCET/ROXICET) 5-325 MG tablet Take 1-2 tablets by mouth every 4 (four) hours as needed for moderate pain or severe pain (for pain scale greater than 7). 40 tablet 0 Past Month at Unknown time    Review of Systems  Constitutional: Negative.   Eyes: Negative for blurred vision.  Respiratory: Negative.   Cardiovascular: Negative.   Gastrointestinal: Negative.   Genitourinary: Negative.   Neurological: Positive for headaches. Negative for dizziness.   Physical Exam   Blood pressure 139/81, pulse 64, temperature 98.1 F (36.7 C), temperature source Oral, resp. rate 16, height 5' 4.5" (1.638 m), weight 286 lb 3.2 oz (129.819 kg), SpO2 99 %, currently breastfeeding.  Patient Vitals for the past 24 hrs:  BP Temp Temp src Pulse Resp SpO2 Height Weight  02/06/15 0132 149/80 mmHg - - (!) 58 - - - -  02/06/15 0113 139/81 mmHg - - 64 - - - -  02/06/15 0106 153/78 mmHg - - 64 16 - - -  02/06/15 0052 152/79 mmHg - - 70 - - - -  02/06/15 0037 168/89 mmHg 98.1 F (36.7 C) Oral 65 20 99 % 5' 4.5" (1.638 m) 286 lb 3.2 oz (129.819 kg)   Physical Exam  Nursing note and vitals reviewed. Constitutional: She  is oriented to person, place, and time. She appears well-developed and well-nourished. No distress.  HENT:  Head: Normocephalic and atraumatic.  Eyes: Conjunctivae are normal. Right eye exhibits no discharge. Left eye exhibits no discharge. No scleral icterus.  Neck: Normal range of motion.  Cardiovascular: Normal rate, regular rhythm and normal heart sounds.   No murmur heard. Respiratory: Effort normal and breath sounds normal. No respiratory distress. She has no wheezes.  GI: Soft.  Neurological: She is alert and oriented to person, place, and time. She has normal reflexes.  No clonus  Skin: Skin is warm and dry. She is not diaphoretic.  Psychiatric: She has a normal mood and affect. Her behavior is normal. Judgment and thought content normal.    MAU Course  Procedures Results for orders placed or performed during the hospital encounter of 02/06/15 (from the past 24 hour(s))  Protein / creatinine ratio, urine     Status: Abnormal   Collection Time: 02/06/15 12:29 AM  Result Value Ref Range   Creatinine, Urine 41.00 mg/dL   Total Protein, Urine 10 mg/dL   Protein Creatinine Ratio 0.24 (H) 0.00 - 0.15 mg/mg[Cre]  CBC     Status: Abnormal   Collection Time: 02/06/15  1:00 AM  Result Value Ref Range   WBC 8.6 4.0 - 10.5 K/uL   RBC 3.89 3.87 - 5.11 MIL/uL   Hemoglobin 9.5 (L) 12.0 - 15.0 g/dL   HCT 16.1 (L) 09.6 - 04.5 %   MCV 78.9 78.0 - 100.0 fL   MCH 24.4 (L) 26.0 - 34.0 pg   MCHC 30.9 30.0 - 36.0 g/dL   RDW 40.9 (H) 81.1 - 91.4 %   Platelets 311 150 - 400 K/uL  Comprehensive metabolic panel     Status: Abnormal   Collection Time: 02/06/15  1:00 AM  Result Value Ref Range   Sodium 138 135 - 145 mmol/L   Potassium 3.6 3.5 - 5.1 mmol/L   Chloride 107 101 - 111 mmol/L   CO2 27 22 - 32 mmol/L   Glucose, Bld 90 65 - 99 mg/dL   BUN 13 6 - 20 mg/dL   Creatinine, Ser 7.82 0.44 - 1.00 mg/dL   Calcium 9.2 8.9 - 95.6 mg/dL   Total Protein 6.6 6.5 - 8.1 g/dL   Albumin 3.2 (L) 3.5  - 5.0 g/dL   AST 18 15 - 41 U/L   ALT 31 14 - 54 U/L   Alkaline Phosphatase 123 38 -  126 U/L   Total Bilirubin 0.3 0.3 - 1.2 mg/dL   GFR calc non Af Amer >60 >60 mL/min   GFR calc Af Amer >60 >60 mL/min   Anion gap 4 (L) 5 - 15  Lactate dehydrogenase     Status: Abnormal   Collection Time: 02/06/15  1:00 AM  Result Value Ref Range   LDH 196 (H) 98 - 192 U/L  Uric acid     Status: Abnormal   Collection Time: 02/06/15  1:00 AM  Result Value Ref Range   Uric Acid, Serum 9.7 (H) 2.3 - 6.6 mg/dL    MDM PI[facility] labs [id number]- S/w Dr. Gaynell FaceMarshall about pt's BPs & labs. Increase HCTZ to 50mg  daily & continue labetalol as prescribed. F/u with Dr. Clearance CootsHarper in 1 week.  Assessment and Plan  A: 1. Postpartum hypertension    P: Discharge home Rx HCTZ 50 mg daily Continue taking labetalol as prescribed F/u with Dr. Clearance CootsHarper in 1 week Discussed reasons to return to MAU  Judeth HornErin Velton Roselle, NP  02/06/2015, 1:15 AM

## 2015-02-06 NOTE — Discharge Instructions (Signed)
Continue taking labetalol as prescribed.  Hydrochlorothiazide increased to 50 mg once per day  Return for headaches not relieved with Tylenol, BP higher than 160/100, or changes in vision associated with elevated blood pressure.    Postpartum Hypertension Postpartum hypertension is high blood pressure after pregnancy that remains higher than normal for more than two days after delivery. You may not realize that you have postpartum hypertension if your blood pressure is not being checked regularly. In some cases, postpartum hypertension will go away on its own, usually within a week of delivery. However, for some women, medical treatment is required to prevent serious complications, such as seizures or stroke. The following things can affect your blood pressure:  The type of delivery you had.  Having received IV fluids or other medicines during or after delivery. CAUSES  Postpartum hypertension may be caused by any of the following or by a combination of any of the following:  Hypertension that existed before pregnancy (chronic hypertension).  Gestational hypertension.  Preeclampsia or eclampsia.  Receiving a lot of fluid through an IV during or after delivery.  Medicines.  HELLP syndrome.  Hyperthyroidism.  Stroke.  Other rare neurological or blood disorders. In some cases, the cause may not be known. RISK FACTORS Postpartum hypertension can be related to one or more risk factors, such as:  Chronic hypertension. In some cases, this may not have been diagnosed before pregnancy.  Obesity.  Type 2 diabetes.  Kidney disease.  Family history of preeclampsia.  Other medical conditions that cause hormonal imbalances. SIGNS AND SYMPTOMS As with all types of hypertension, postpartum hypertension may not have any symptoms. Depending on how high your blood pressure is, you may experience:  Headaches. These may be mild, moderate, or severe. They may also be steady, constant, or  sudden in onset (thunderclap headache).  Visual changes.  Dizziness.  Shortness of breath.  Swelling of your hands, feet, lower legs, or face. In some cases, you may have swelling in more than one of these locations.  Heart palpitations or a racing heartbeat.  Difficulty breathing while lying down.  Decreased urination. Other rare signs and symptoms may include:  Sweating more than usual. This lasts longer than a few days after delivery.  Chest pain.  Sudden dizziness when you get up from sitting or lying down.  Seizures.  Nausea or vomiting.  Abdominal pain. DIAGNOSIS The diagnosis of postpartum hypertension is made through a combination of physical examination findings and testing of your blood and urine. You may also have additional tests, such as a CT scan or an MRI, to check for other complications of postpartum hypertension. TREATMENT When blood pressure is high enough to require treatment, your options may include:  Medicines to reduce blood pressure (antihypertensives). Tell your health care provider if you are breastfeeding or if you plan to breastfeed. There are many antihypertensive medicines that are safe to take while breastfeeding.  Stopping medicines that may be causing hypertension.  Treating medical conditions that are causing hypertension.  Treating the complications of hypertension, such as seizures, stroke, or kidney problems. Your health care provider will also continue to monitor your blood pressure closely and repeatedly until it is within a safe range for you.  HOME CARE INSTRUCTIONS  Take medicines only as directed by your health care provider.  Get regular exercise after your health care provider tells you that it is safe.  Follow your health care provider's recommendations on fluid and salt restrictions.  Do not use any tobacco  products, including cigarettes, chewing tobacco, or electronic cigarettes. If you need help quitting, ask your  health care provider.  Keep all follow-up visits as directed by your health care provider. This is important. SEEK MEDICAL CARE IF:  Your symptoms get worse.  You have new symptoms, such as:  Headache.  Dizziness.  Visual changes. SEEK IMMEDIATE MEDICAL CARE IF:  You develop a severe or sudden headache.  You have seizures.  You develop numbness or weakness on one side of your body.  You have difficulty thinking, speaking, or swallowing.  You develop severe abdominal pain.  You develop difficulty breathing, chest pain, a racing heartbeat, or heart palpitations. These symptoms may represent a serious problem that is an emergency. Do not wait to see if the symptoms will go away. Get medical help right away. Call your local emergency services (911 in the [place] Do not drive yourself to the hospital.   This information is not intended to replace advice given to you by your health care provider. Make sure you discuss any questions you have with your health care provider.   Document Released: 11/26/2013 Document Reviewed: 11/26/2013 Elsevier Interactive Patient Education [employer]2016 Elsevier Inc.

## 2015-02-08 ENCOUNTER — Ambulatory Visit: Payer: Medicaid Other | Admitting: Obstetrics

## 2015-02-16 ENCOUNTER — Ambulatory Visit (INDEPENDENT_AMBULATORY_CARE_PROVIDER_SITE_OTHER): Payer: Medicaid Other | Admitting: Obstetrics

## 2015-02-16 ENCOUNTER — Ambulatory Visit: Payer: Medicaid Other | Admitting: Obstetrics

## 2015-02-16 ENCOUNTER — Encounter: Payer: Self-pay | Admitting: Obstetrics

## 2015-02-16 DIAGNOSIS — I10 Essential (primary) hypertension: Secondary | ICD-10-CM

## 2015-02-16 MED ORDER — CARVEDILOL 12.5 MG PO TABS: 12.5000 mg | ORAL_TABLET | Freq: Two times a day (BID) | ORAL | Status: DC

## 2015-02-16 MED ORDER — AMLODIPINE BESYLATE 5 MG PO TABS: 5.0000 mg | ORAL_TABLET | Freq: Every day | ORAL | Status: DC

## 2015-02-16 NOTE — Progress Notes (Signed)
Subjective:     Crystal Burns is a 34 y.o. female who presents for a postpartum visit. She is 2 weeks postpartum following a spontaneous vaginal delivery. I have fully reviewed the prenatal and intrapartum course. The delivery was at 39 gestational weeks. Outcome: spontaneous vaginal delivery. Anesthesia: none. Postpartum course has been normal. Baby's course has been normal. Baby is feeding by breast. Bleeding thin lochia. Bowel function is normal. Bladder function is normal. Patient is not sexually active. Contraception method is abstinence. Postpartum depression screening: negative.  Tobacco, alcohol and substance abuse history reviewed.  Adult immunizations reviewed including TDAP, rubella and varicella.  The following portions of the patient's history were reviewed and updated as appropriate: allergies, current medications, past family history, past medical history, past social history, past surgical history and problem list.  Review of Systems A comprehensive review of systems was negative.   Objective:    BP 173/107 mmHg  Pulse 86  Temp(Src) 98.5 F (36.9 C)  Ht 5\' 3"  (1.6 m)  Wt 265 lb (120.203 kg)  BMI 46.95 kg/m2  Breastfeeding? Yes  PE:  Deferred   100% of 15 min visit spent on counseling and coordination of care.    Assessment:    2 weeks postpartum.  H/O PIH.  On Labetalol and HCTZ  Persistent BP elevation   Plan:    1. Contraception: abstinence.  Wants tubal. 2. Labetalol D/C'd.  Coreg, Norvasc and HCTZ started. 3. Follow up in: 2 weeks or as needed. BP check.  Healthy lifestyle practices reviewed

## 2015-02-27 ENCOUNTER — Ambulatory Visit (INDEPENDENT_AMBULATORY_CARE_PROVIDER_SITE_OTHER): Payer: Medicaid Other | Admitting: Obstetrics

## 2015-02-27 DIAGNOSIS — Z30013 Encounter for initial prescription of injectable contraceptive: Secondary | ICD-10-CM

## 2015-02-27 DIAGNOSIS — I10 Essential (primary) hypertension: Secondary | ICD-10-CM

## 2015-02-27 DIAGNOSIS — O924 Hypogalactia: Secondary | ICD-10-CM

## 2015-02-27 MED ORDER — CARVEDILOL 12.5 MG PO TABS: 25.0000 mg | ORAL_TABLET | Freq: Two times a day (BID) | ORAL | Status: DC

## 2015-02-27 MED ORDER — AMLODIPINE BESYLATE 5 MG PO TABS: 10.0000 mg | ORAL_TABLET | Freq: Every day | ORAL | Status: DC

## 2015-02-27 MED ORDER — MEDROXYPROGESTERONE ACETATE 150 MG/ML IM SUSP: 150.0000 mg | INTRAMUSCULAR | Status: DC

## 2015-02-27 MED ORDER — METOCLOPRAMIDE HCL 10 MG PO TABS: 10.0000 mg | ORAL_TABLET | Freq: Four times a day (QID) | ORAL | Status: DC

## 2015-03-03 ENCOUNTER — Encounter: Payer: Self-pay | Admitting: Obstetrics

## 2015-03-03 NOTE — Progress Notes (Signed)
Subjective:     Crystal Burns is a 34 y.o. female who presents for a postpartum visit. She is being followed for postpartum hypertension.    Review of Systems A comprehensive review of systems was negative.   Objective:   PE:  Deferred   Breastfeeding? Yes     Assessment:    Postpartum Hypertension.  BP continues to be elevated but stable.  Contraceptive management  Plan:    1. Contraception: Depo Provera 2. BP meds adjusted 3. Follow up in: 2 weeks or as needed.   Healthy lifestyle practices reviewed

## 2015-03-06 ENCOUNTER — Telehealth: Payer: Self-pay

## 2015-03-06 ENCOUNTER — Other Ambulatory Visit: Payer: Self-pay | Admitting: Obstetrics

## 2015-03-06 DIAGNOSIS — I10 Essential (primary) hypertension: Secondary | ICD-10-CM

## 2015-03-06 NOTE — Telephone Encounter (Signed)
[facility] [address], left phone number and address on her vm box - told her to call me with questions

## 2015-03-07 ENCOUNTER — Ambulatory Visit: Payer: Medicaid Other | Admitting: Obstetrics

## 2015-03-07 ENCOUNTER — Telehealth: Payer: Self-pay

## 2015-03-07 NOTE — Telephone Encounter (Signed)
CALLED PATIENT AND LEFT MESSAGE ON HER PHONE AND ONE SHE GAVE US AS WELL, [phone] - SHE HAS DR APPT WITH [facility] FOR BP ISSUES 12/2 AT NOON ARR BY 11:45AM

## 2015-03-08 ENCOUNTER — Ambulatory Visit: Payer: Medicaid Other | Admitting: Obstetrics

## 2015-03-09 ENCOUNTER — Ambulatory Visit (INDEPENDENT_AMBULATORY_CARE_PROVIDER_SITE_OTHER): Payer: Medicaid Other | Admitting: Obstetrics

## 2015-03-09 ENCOUNTER — Encounter: Payer: Self-pay | Admitting: *Deleted

## 2015-03-09 ENCOUNTER — Encounter: Payer: Self-pay | Admitting: Obstetrics

## 2015-03-09 DIAGNOSIS — I10 Essential (primary) hypertension: Secondary | ICD-10-CM

## 2015-03-09 DIAGNOSIS — Z30013 Encounter for initial prescription of injectable contraceptive: Secondary | ICD-10-CM | POA: Diagnosis not present

## 2015-03-09 MED ORDER — MEDROXYPROGESTERONE ACETATE 150 MG/ML IM SUSP
150.0000 mg | INTRAMUSCULAR | Status: DC
  Administered 2015-03-09: 150 mg via INTRAMUSCULAR

## 2015-03-09 NOTE — Progress Notes (Signed)
Subjective:     Crystal Burns is a 34 y.o. female who presents for a postpartum visit. She is 6 weeks postpartum following a spontaneous vaginal delivery. I have fully reviewed the prenatal and intrapartum course. The delivery was at 39 gestational weeks. Outcome: spontaneous vaginal delivery. Anesthesia: none. Postpartum course has been normal. Baby's course has been normal. Baby is feeding by both breast and bottle - Similac Advance. Bleeding no bleeding. Bowel function is normal. Bladder function is normal. Patient is not sexually active. Contraception method is abstinence. Postpartum depression screening: negative.  Tobacco, alcohol and substance abuse history reviewed.  Adult immunizations reviewed including TDAP, rubella and varicella.  The following portions of the patient's history were reviewed and updated as appropriate: allergies, current medications, past family history, past medical history, past social history, past surgical history and problem list.  Review of Systems A comprehensive review of systems was negative except for: Behavioral/Psych: positive for anxiety   Objective:    BP 142/88 mmHg  Pulse 86  Temp(Src) 98.6 F (37 C)  Wt 258 lb (117.028 kg)  Breastfeeding? Yes  General:  alert and no distress   Breasts:  inspection negative, no nipple discharge or bleeding, no masses or nodularity palpable  Lungs: clear to auscultation bilaterally  Heart:  regular rate and rhythm, S1, S2 normal, no murmur, click, rub or gallop  Abdomen: soft, non-tender; bowel sounds normal; no masses,  no organomegaly   Vulva:  normal  Vagina: normal vagina  Cervix:  no cervical motion tenderness  Corpus: normal size, contour, position, consistency, mobility, non-tender  Adnexa:  no mass, fullness, tenderness  Rectal Exam: Not performed.          Assessment:     Normal postpartum exam. Pap smear not done at today's visit.    Contraceptive management.  Wants Depo Provera.  Plan:     1. Contraception: Depo-Provera injections 2. Depo Provera Rx 3. Follow up in: several months or as needed.   Healthy lifestyle practices reviewed

## 2015-04-29 ENCOUNTER — Other Ambulatory Visit: Payer: Self-pay | Admitting: Family Medicine

## 2015-05-05 ENCOUNTER — Telehealth: Payer: Self-pay

## 2015-05-05 NOTE — Telephone Encounter (Signed)
LEFT VM ON NUMBER IN EPIC, BUT GIVEN ANOTHER NUMBER [phone] - PATIENT CALLED BACK - HER DENTIST WILL SEE HER ON 05/09/15 AT 1PM - TOLD HER TO BRING MEDICAID CARD AND COPAY - SHE HAS NOT BEEN SINCE 2014, SO THEY COULD NOT GIVE HER PRESCRIPTION FOR ANTIBIOTICS - SHE WANTED TO KNOW IF DR HARPER COULD - TOLD HER TO LEAVE VM MESSAGE ON NURSELINE

## 2015-05-06 ENCOUNTER — Emergency Department (HOSPITAL_COMMUNITY)
Admission: EM | Admit: 2015-05-06 | Discharge: 2015-05-06 | Disposition: A | Discharge status: Home / Self Care | Payer: Medicaid Other | Source: Home / Self Care | Attending: Emergency Medicine | Admitting: Emergency Medicine

## 2015-05-06 DIAGNOSIS — K0510 Chronic gingivitis, plaque induced: Secondary | ICD-10-CM | POA: Diagnosis not present

## 2015-05-06 DIAGNOSIS — K029 Dental caries, unspecified: Secondary | ICD-10-CM

## 2015-05-06 DIAGNOSIS — K061 Gingival enlargement: Secondary | ICD-10-CM

## 2015-05-06 MED ORDER — CHLORHEXIDINE GLUCONATE 0.12 % MT SOLN: OROMUCOSAL | Status: DC

## 2015-05-06 MED ORDER — PENICILLIN V POTASSIUM 500 MG PO TABS: 500.0000 mg | ORAL_TABLET | Freq: Four times a day (QID) | ORAL | Status: DC

## 2015-05-06 MED ORDER — IBUPROFEN 800 MG PO TABS: 800.0000 mg | ORAL_TABLET | Freq: Four times a day (QID) | ORAL | Status: DC | PRN

## 2015-05-06 NOTE — ED Notes (Signed)
Patient states she recently had a partial put in her mouth And now having swollen gums to the top of her mouth

## 2015-05-06 NOTE — ED Provider Notes (Signed)
HPI  SUBJECTIVE:  Crystal Burns is a 35 y.o. female who presents with tender painful gingival swelling, bleeding x 1 week. States is located around a  Social worker that came loose last month. States that she put the crown back. She reports nausea, and right-sided facial swelling this morning. Also some mild right-sided lower dental pain rear molar. No vomiting, fevers, intraoral ulcers. No drooling, trismus. No swelling underneath her tongue or face. No white coating on her tongue, sore throat. She is not on any inhaled or oral oral steroids. Symptoms are better with ibuprofen, worse with exposure to cold air and cold foods, she has tried ibuprofen 600 mg for this. She is not on any other medications other than her high blood pressure medicines which she states she is compliant with. She has follow-up with her dentist on Tuesday.  LMP December 2016. States that she is on Depo-Provera shot. Denies any possibility of being pregnant. She is not breast-feeding.  Past Medical History  Diagnosis Date  . History of gonorrhea   . Gestational diabetes     2012  . Hypertension     2004  . Asthma     2010-only bothers pt during bad cold  . Depression     2010-had when mother passed away  . Pregnancy induced hypertension     Past Surgical History  Procedure Laterality Date  . Dilation and curettage of uterus    . Cholecystectomy    . Wisdom tooth extraction      Family History  Problem Relation Age of Onset  . Hypertension Mother   . Diabetes Mother   . Cancer Mother   . Hypertension Father   . Diabetes Father   . Hyperlipidemia Father   . Arthritis    . Asthma    . Breast cancer    . Heart failure    . Congenital heart disease    . Depression    . Heart attack      Social History  Substance Use Topics  . Smoking status: Former Smoker    Types: Cigarettes    Quit date: 10/31/2010  . Smokeless tobacco: Never Used  . Alcohol Use: No    No current facility-administered  medications for this encounter.  Current outpatient prescriptions:  .  amLODipine (NORVASC) 5 MG tablet, Take 2 tablets (10 mg total) by mouth daily., Disp: 30 tablet, Rfl: 5 .  carvedilol (COREG) 12.5 MG tablet, Take 2 tablets (25 mg total) by mouth 2 (two) times daily with a meal., Disp: 120 tablet, Rfl: 5 .  chlorhexidine (PERIDEX) 0.12 % solution, 15 mL swish and spit bid, Disp: 480 mL, Rfl: 0 .  hydrochlorothiazide (HYDRODIURIL) 50 MG tablet, TAKE 1 TABLET (50 MG TOTAL) BY MOUTH DAILY., Disp: 30 tablet, Rfl: 0 .  ibuprofen (ADVIL,MOTRIN) 800 MG tablet, Take 1 tablet (800 mg total) by mouth every 6 (six) hours as needed., Disp: 30 tablet, Rfl: 0 .  Iron-FA-B Cmp-C-Biot-Probiotic (FUSION PLUS) CAPS, Take 1 capsule by mouth daily before breakfast., Disp: 30 capsule, Rfl: 5 .  medroxyPROGESTERone (DEPO-PROVERA) 150 MG/ML injection, Inject 1 mL (150 mg total) into the muscle every 3 (three) months. (Patient not taking: Reported on 03/09/2015), Disp: 150 mL, Rfl: 3 .  penicillin v potassium (VEETID) 500 MG tablet, Take 1 tablet (500 mg total) by mouth 4 (four) times daily. X 10 days, Disp: 40 tablet, Rfl: 0  Allergies  Allergen Reactions  . Shellfish Allergy Anaphylaxis  . Bee Venom Hives and  Swelling     ROS  As noted in HPI.   Physical Exam  BP 139/82 mmHg  Pulse 63  Temp(Src) 98.1 F (36.7 C) (Oral)  Resp 20  SpO2 100%  Constitutional: Well developed, well nourished, no acute distress Eyes:  EOMI, conjunctiva normal bilaterally HENT: Normocephalic, atraumatic,mucus membranes moist. No appreciable facial swelling. No trismus, drooling. Poor dentition. Marked gingival tenderness and hyperplasia anterior and posterior between tooth 9 and 10, friable gingiva. No expressible purulent drainage. Tooth #10, lateral incisor, nontender, but loose. Patient states that this is a crown.Caries tooth 31, right lower second molar. No dental tenderness. No gingival swelling, tenderness around this.  no expressible purulent drainage. No swelling underneath the tongue. Respiratory: Normal inspiratory effort Cardiovascular: Normal rate GI: nondistended skin: No rash, skin intact Musculoskeletal: no deformities Neurologic: Alert & oriented x 3, no focal neuro deficits Psychiatric: Speech and behavior appropriate   ED Course   Medications - No data to display  No orders of the defined types were placed in this encounter.    No results found for this or any previous visit (from the past 24 hour(s)). No results found.  ED Clinical Impression  Gingivitis  Gingival hyperplasia  Dental caries   ED Assessment/Plan  Patient with gingival hyperplasia, gingivitis. Also has some caries posterior molar. No apparent dental infection here. We'll send home with chlorhexidine mouthwash, penicillin, ibuprofen 800 mg, she has follow-up with dentistry Dr. Irena Cords at [facility] on [address] on Tuesday.  Discussed MDM, plan and followup with patient.  Patient  agrees with plan.   *This clinic note was created using Dragon dictation software. Therefore, there may be occasional mistakes despite careful proofreading.  ?    Domenick Gong, MD 05/06/15 1929

## 2015-05-12 ENCOUNTER — Emergency Department (HOSPITAL_COMMUNITY)
Admission: EM | Admit: 2015-05-12 | Discharge: 2015-05-12 | Disposition: A | Discharge status: Home / Self Care | Payer: Medicaid Other | Attending: Emergency Medicine | Admitting: Emergency Medicine

## 2015-05-12 ENCOUNTER — Encounter (HOSPITAL_COMMUNITY): Payer: Self-pay

## 2015-05-12 DIAGNOSIS — Z8619 Personal history of other infectious and parasitic diseases: Secondary | ICD-10-CM | POA: Insufficient documentation

## 2015-05-12 DIAGNOSIS — Z8632 Personal history of gestational diabetes: Secondary | ICD-10-CM | POA: Insufficient documentation

## 2015-05-12 DIAGNOSIS — R030 Elevated blood-pressure reading, without diagnosis of hypertension: Secondary | ICD-10-CM

## 2015-05-12 DIAGNOSIS — IMO0001 Reserved for inherently not codable concepts without codable children: Secondary | ICD-10-CM

## 2015-05-12 DIAGNOSIS — I10 Essential (primary) hypertension: Secondary | ICD-10-CM | POA: Insufficient documentation

## 2015-05-12 DIAGNOSIS — J45909 Unspecified asthma, uncomplicated: Secondary | ICD-10-CM | POA: Insufficient documentation

## 2015-05-12 DIAGNOSIS — Z87891 Personal history of nicotine dependence: Secondary | ICD-10-CM | POA: Insufficient documentation

## 2015-05-12 DIAGNOSIS — Z79899 Other long term (current) drug therapy: Secondary | ICD-10-CM | POA: Insufficient documentation

## 2015-05-12 DIAGNOSIS — Z8659 Personal history of other mental and behavioral disorders: Secondary | ICD-10-CM | POA: Insufficient documentation

## 2015-05-12 LAB — COMPREHENSIVE METABOLIC PANEL
ALT: 27 U/L (ref 14–54)
AST: 15 U/L (ref 15–41)
Albumin: 3.7 g/dL (ref 3.5–5.0)
Alkaline Phosphatase: 88 U/L (ref 38–126)
Anion gap: 9 (ref 5–15)
BUN: 10 mg/dL (ref 6–20)
CO2: 26 mmol/L (ref 22–32)
Calcium: 10.1 mg/dL (ref 8.9–10.3)
Chloride: 104 mmol/L (ref 101–111)
Creatinine, Ser: 0.61 mg/dL (ref 0.44–1.00)
GFR calc Af Amer: >60 mL/min (ref >60)
GFR calc non Af Amer: >60 mL/min (ref >60)
Glucose, Bld: 97 mg/dL (ref 65–99)
Potassium: 4.6 mmol/L (ref 3.5–5.1)
Sodium: 139 mmol/L (ref 135–145)
Total Bilirubin: 0.9 mg/dL (ref 0.3–1.2)
Total Protein: 7 g/dL (ref 6.5–8.1)

## 2015-05-12 LAB — CBC WITH DIFFERENTIAL/PLATELET
Basophils Absolute: 0 10*3/uL (ref 0.0–0.1)
Basophils Relative: 0 %
Eosinophils Absolute: 0.2 10*3/uL (ref 0.0–0.7)
Eosinophils Relative: 3 %
HCT: 36.5 % (ref 36.0–46.0)
Hemoglobin: 11.8 g/dL — ABNORMAL LOW (ref 12.0–15.0)
Lymphocytes Relative: 27 %
Lymphs Abs: 1.9 10*3/uL (ref 0.7–4.0)
MCH: 25.9 pg — ABNORMAL LOW (ref 26.0–34.0)
MCHC: 32.3 g/dL (ref 30.0–36.0)
MCV: 80.2 fL (ref 78.0–100.0)
Monocytes Absolute: 0.2 10*3/uL (ref 0.1–1.0)
Monocytes Relative: 3 %
Neutro Abs: 4.7 10*3/uL (ref 1.7–7.7)
Neutrophils Relative %: 67 %
Platelets: 312 10*3/uL (ref 150–400)
RBC: 4.55 MIL/uL (ref 3.87–5.11)
RDW: 15.5 % (ref 11.5–15.5)
WBC: 7 10*3/uL (ref 4.0–10.5)

## 2015-05-12 NOTE — ED Notes (Signed)
Pt presents with onset of lightheadedness that she first noted today.  Pt was at dentist office for her children and had BP taken that was elevated.  Pt reports she went to her dentist x 3 days ago- was placed on abx, initially pcn, than amoxicillin.

## 2015-05-12 NOTE — ED Provider Notes (Signed)
CSN: [id number]     Arrival date & time 05/12/15  1243 History  By signing my name below, I, Elon Spanner, attest that this documentation has been prepared under the direction and in the presence of [employer], PA-C. Electronically Signed: Elon Spanner ED Scribe. 05/12/2015. 1:52 PM.    No chief complaint on file.  HPI HPI Comments: Crystal Burns is a 35 y.o. female with hx of HTN who presents to the Emergency Department complaining of HTN.  The patient reports she was at her children's dental appointment this morning when she became lightheaded.  Her BP was taken at the dentist's office and was elevated but she does not recall the reading.  She also notes mild intermittent nausea onset 3 days ago after starting antibiotics for a dental problem.  The patient reports eating a slice of pizza this morning.  She is followed by her PCP for her HTN and is rx'd BID 12.5 Coreg with compliance (last dose: 9:00 am). She denies LOC, CP, vision changes, vomiting  Primary Care: [facility] and [facility].    Past Medical History  Diagnosis Date  . History of gonorrhea   . Gestational diabetes     2012  . Hypertension     2004  . Asthma     2010-only bothers pt during bad cold  . Depression     2010-had when mother passed away  . Pregnancy induced hypertension    Past Surgical History  Procedure Laterality Date  . Dilation and curettage of uterus    . Cholecystectomy    . Wisdom tooth extraction     Family History  Problem Relation Age of Onset  . Hypertension Mother   . Diabetes Mother   . Cancer Mother   . Hypertension Father   . Diabetes Father   . Hyperlipidemia Father   . Arthritis    . Asthma    . Breast cancer    . Heart failure    . Congenital heart disease    . Depression    . Heart attack     Social History  Substance Use Topics  . Smoking status: Former Smoker    Types: Cigarettes    Quit date: 10/31/2010  . Smokeless tobacco: Never Used  .  Alcohol Use: No   OB History    Gravida Para Term Preterm AB TAB SAB Ectopic Multiple Living   0 4 0 4 0 0 6     Review of Systems A complete 10 system review of systems was obtained and all systems are negative except as noted in the HPI and PMH.   Allergies  Shellfish allergy and Bee venom  Home Medications   Prior to Admission medications   Medication Sig Start Date End Date Taking? Authorizing Provider  amLODipine (NORVASC) 5 MG tablet Take 2 tablets (10 mg total) by mouth daily. 02/27/15   Brock Bad, MD  carvedilol (COREG) 12.5 MG tablet Take 2 tablets (25 mg total) by mouth 2 (two) times daily with a meal. 02/27/15   Brock Bad, MD  chlorhexidine (PERIDEX) 0.12 % solution 15 mL swish and spit bid 05/06/15   Domenick Gong, MD  hydrochlorothiazide (HYDRODIURIL) 50 MG tablet TAKE 1 TABLET (50 MG TOTAL) BY MOUTH DAILY. 05/01/15   Reva Bores, MD  ibuprofen (ADVIL,MOTRIN) 800 MG tablet Take 1 tablet (800 mg total) by mouth every 6 (six) hours as needed. 05/06/15   Domenick Gong, MD  Iron-FA-B Cmp-C-Biot-Probiotic (FUSION PLUS) CAPS Take 1 capsule by mouth daily before breakfast. 01/29/15   Brock Bad, MD  medroxyPROGESTERone (DEPO-PROVERA) 150 MG/ML injection Inject 1 mL (150 mg total) into the muscle every 3 (three) months. Patient not taking: Reported on 03/09/2015 02/27/15   Rachelle A Denney, CNM  penicillin v potassium (VEETID) 500 MG tablet Take 1 tablet (500 mg total) by mouth 4 (four) times daily. X 10 days 05/06/15   Domenick Gong, MD   BP 118/67 mmHg  Pulse 71  Temp(Src) 98 F (36.7 C) (Oral)  Resp 19  Ht  (1.6 m)  Wt 122.018 kg  BMI 47.66 kg/m2  SpO2 98%  Breastfeeding? No Physical Exam  Constitutional: She is oriented to person, place, and time. She appears well-developed and well-nourished.  HENT:  Head: Normocephalic and atraumatic.  Mouth/Throat: Oropharynx is clear and moist.  Eyes: Conjunctivae and EOM are normal. Pupils  are equal, round, and reactive to light.  No TTP of maxillary or frontal sinuses  No TTP or induration of temporal arteries bilaterally  Neck: Normal range of motion. Neck supple.  FROM to C-spine. Pt can touch chin to chest without discomfort. No TTP of midline cervical spine.   Cardiovascular: Normal rate, regular rhythm and intact distal pulses.   Pulmonary/Chest: Effort normal and breath sounds normal. No respiratory distress. She has no wheezes. She has no rales. She exhibits no tenderness.  Abdominal: Soft. Bowel sounds are normal. She exhibits no distension and no mass. There is no tenderness. There is no rebound and no guarding.  Musculoskeletal: Normal range of motion. She exhibits no edema or tenderness.  Neurological: She is alert and oriented to person, place, and time. No cranial nerve deficit.  II-Visual fields grossly intact. III/IV/VI-Extraocular movements intact.  Pupils reactive bilaterally. V/VII-Smile symmetric, equal eyebrow raise,  facial sensation intact VIII- Hearing grossly intact IX/X-Normal gag XI-bilateral shoulder shrug XII-midline tongue extension Motor: 5/5 bilaterally with normal tone and bulk Cerebellar: Normal finger-to-nose  and normal heel-to-shin test.   Romberg negative Ambulates with a coordinated gait   Nursing note and vitals reviewed.   ED Course  Procedures (including critical care time)  DIAGNOSTIC STUDIES: Oxygen Saturation is 100% on RA, normal by my interpretation.    COORDINATION OF CARE:  1:58 PM Discussed plan to or ECG.  Patient acknowledges and agrees with plan.    Labs Review Labs Reviewed  CBC WITH DIFFERENTIAL/PLATELET - Abnormal; Notable for the following:    Hemoglobin 11.8 (*)    MCH 25.9 (*)    All other components within normal limits  COMPREHENSIVE METABOLIC PANEL    Imaging Review No results found. I have personally reviewed and evaluated these images and lab results as part of my medical  decision-making.   EKG Interpretation   Date/Time:  Friday May 12 2015 14:02:45 EST Ventricular Rate:  71 PR Interval:  180 QRS Duration: 84 QT Interval:  380 QTC Calculation: 412 R Axis:   0 Text Interpretation:  Normal sinus rhythm Low voltage QRS Cannot rule out  Anterior infarct , age undetermined Abnormal ECG No significant change  since last tracing Confirmed by YAO  MD, DAVID ([id number]) on 05/12/2015 2:08:42  PM      MDM   Final diagnoses:  Elevated blood pressure    Filed Vitals:   05/12/15 1253 05/12/15 1430  BP: 156/91 118/67  Pulse: 79 71  Temp: 98 F (36.7 C)   TempSrc: Oral   Resp: 18 19  Height:  5\' 3"  (1.6 m)   Weight: 122.018 kg   SpO2: 100% 98%    Crystal Burns is 35 y.o. female presenting with elevated blood pressure and feeling lightheaded. EKG nonischemic, blood work reassuring. Patient does have a primary care, she's been compliant with her medications, physical exam with no abnormality. Repeat blood pressure normalized. Advised her to cut down on salt intake and follow with her PCP for recheck.  Evaluation does not show pathology that would require ongoing emergent intervention or inpatient treatment. Pt is hemodynamically stable and mentating appropriately. Discussed findings and plan with patient/guardian, who agrees with care plan. All questions answered. Return precautions discussed and outpatient follow up given.    I personally performed the services described in this documentation, which was scribed in my presence. The recorded information has been reviewed and is accurate.   Wynetta Emery, PA-C 05/12/15 1438  Richardean Canal, MD 05/12/15 [phone]

## 2015-05-12 NOTE — Discharge Instructions (Signed)
Please follow with your primary care doctor in the next 2 days for a check-up. They must obtain records for further management.  ° °Do not hesitate to return to the Emergency Department for any new, worsening or concerning symptoms.  ° ° °DASH Eating Plan °DASH stands for "Dietary Approaches to Stop Hypertension." The DASH eating plan is a healthy eating plan that has been shown to reduce high blood pressure (hypertension). Additional health benefits may include reducing the risk of type 2 diabetes mellitus, heart disease, and stroke. The DASH eating plan may also help with weight loss. °WHAT DO I NEED TO KNOW ABOUT THE DASH EATING PLAN? °For the DASH eating plan, you will follow these general guidelines: °· Choose foods with a percent daily value for sodium of less than 5% (as listed on the food label). °· Use salt-free seasonings or herbs instead of table salt or sea salt. °· Check with your health care provider or pharmacist before using salt substitutes. °· Eat lower-sodium products, often labeled as "lower sodium" or "no salt added." °· Eat fresh foods. °· Eat more vegetables, fruits, and low-fat dairy products. °· Choose whole grains. Look for the word "whole" as the first word in the ingredient list. °· Choose fish and skinless chicken or turkey more often than red meat. Limit fish, poultry, and meat to 6 oz (170 g) each day. °· Limit sweets, desserts, sugars, and sugary drinks. °· Choose heart-healthy fats. °· Limit cheese to 1 oz (28 g) per day. °· Eat more home-cooked food and less restaurant, buffet, and fast food. °· Limit fried foods. °· Cook foods using methods other than frying. °· Limit canned vegetables. If you do use them, rinse them well to decrease the sodium. °· When eating at a restaurant, ask that your food be prepared with less salt, or no salt if possible. °WHAT FOODS CAN I EAT? °Seek help from a dietitian for individual calorie needs. °Grains °Whole grain or whole wheat bread. Brown rice.  Whole grain or whole wheat pasta. Quinoa, bulgur, and whole grain cereals. Low-sodium cereals. Corn or whole wheat flour tortillas. Whole grain cornbread. Whole grain crackers. Low-sodium crackers. °Vegetables °Fresh or frozen vegetables (raw, steamed, roasted, or grilled). Low-sodium or reduced-sodium tomato and vegetable juices. Low-sodium or reduced-sodium tomato sauce and paste. Low-sodium or reduced-sodium canned vegetables.  °Fruits °All fresh, canned (in natural juice), or frozen fruits. °Meat and Other Protein Products °Ground beef (85% or leaner), grass-fed beef, or beef trimmed of fat. Skinless chicken or turkey. Ground chicken or turkey. Pork trimmed of fat. All fish and seafood. Eggs. Dried beans, peas, or lentils. Unsalted nuts and seeds. Unsalted canned beans. °Dairy °Low-fat dairy products, such as skim or 1% milk, 2% or reduced-fat cheeses, low-fat ricotta or cottage cheese, or plain low-fat yogurt. Low-sodium or reduced-sodium cheeses. °Fats and Oils °Tub margarines without trans fats. Light or reduced-fat mayonnaise and salad dressings (reduced sodium). Avocado. Safflower, olive, or canola oils. Natural peanut or almond butter. °Other °Unsalted popcorn and pretzels. °The items listed above may not be a complete list of recommended foods or beverages. Contact your dietitian for more options. °WHAT FOODS ARE NOT RECOMMENDED? °Grains °White bread. White pasta. White rice. Refined cornbread. Bagels and croissants. Crackers that contain trans fat. °Vegetables °Creamed or fried vegetables. Vegetables in a cheese sauce. Regular canned vegetables. Regular canned tomato sauce and paste. Regular tomato and vegetable juices. °Fruits °Dried fruits. Canned fruit in light or heavy syrup. Fruit juice. °Meat and Other Protein Products °  Fatty cuts of meat. Ribs, chicken wings, bacon, sausage, bologna, salami, chitterlings, fatback, hot dogs, bratwurst, and packaged luncheon meats. Salted nuts and seeds. Canned  beans with salt. °Dairy °Whole or 2% milk, cream, half-and-half, and cream cheese. Whole-fat or sweetened yogurt. Full-fat cheeses or blue cheese. Nondairy creamers and whipped toppings. Processed cheese, cheese spreads, or cheese curds. °Condiments °Onion and garlic salt, seasoned salt, table salt, and sea salt. Canned and packaged gravies. Worcestershire sauce. Tartar sauce. Barbecue sauce. Teriyaki sauce. Soy sauce, including reduced sodium. Steak sauce. Fish sauce. Oyster sauce. Cocktail sauce. Horseradish. Ketchup and mustard. Meat flavorings and tenderizers. Bouillon cubes. Hot sauce. Tabasco sauce. Marinades. Taco seasonings. Relishes. °Fats and Oils °Butter, stick margarine, lard, shortening, ghee, and bacon fat. Coconut, palm kernel, or palm oils. Regular salad dressings. °Other °Pickles and olives. Salted popcorn and pretzels. °The items listed above may not be a complete list of foods and beverages to avoid. Contact your dietitian for more information. °WHERE CAN I FIND MORE INFORMATION? °National Heart, Lung, and [employer] Institute: www.nhlbi.nih.gov/health/health-topics/topics/dash/ °  °This information is not intended to replace advice given to you by your health care provider. Make sure you discuss any questions you have with your health care provider. °  °Document Released: 03/14/2011 Document Revised: 04/15/2014 Document Reviewed: 01/27/2013 °Elsevier Interactive Patient Education ©2016 Elsevier Inc. ° °

## 2015-05-15 MED FILL — IBUPROFEN 600 MG TABLET: 600 | 22 days supply | Qty: 90 | Fill #0

## 2015-05-15 MED FILL — ?AMLODIPINE BESYLATE 5 MG T: 5 | 15 days supply | Qty: 30 | Fill #0

## 2015-05-18 ENCOUNTER — Encounter: Payer: Self-pay | Admitting: Family Medicine

## 2015-05-18 ENCOUNTER — Ambulatory Visit: Payer: Medicaid Other | Attending: Family Medicine | Admitting: Family Medicine

## 2015-05-18 VITALS — BP 112/76 | HR 76 | Temp 98.3°F | Resp 14 | Ht 63.0 in | Wt 256.0 lb

## 2015-05-18 DIAGNOSIS — O1495 Unspecified pre-eclampsia, complicating the puerperium: Secondary | ICD-10-CM | POA: Insufficient documentation

## 2015-05-18 DIAGNOSIS — I10 Essential (primary) hypertension: Secondary | ICD-10-CM

## 2015-05-18 DIAGNOSIS — Z0001 Encounter for general adult medical examination with abnormal findings: Secondary | ICD-10-CM | POA: Insufficient documentation

## 2015-05-18 DIAGNOSIS — Z79899 Other long term (current) drug therapy: Secondary | ICD-10-CM | POA: Insufficient documentation

## 2015-05-18 DIAGNOSIS — O9089 Other complications of the puerperium, not elsewhere classified: Secondary | ICD-10-CM | POA: Insufficient documentation

## 2015-05-18 DIAGNOSIS — R079 Chest pain, unspecified: Secondary | ICD-10-CM | POA: Insufficient documentation

## 2015-05-18 DIAGNOSIS — Z131 Encounter for screening for diabetes mellitus: Secondary | ICD-10-CM

## 2015-05-18 DIAGNOSIS — R42 Dizziness and giddiness: Secondary | ICD-10-CM | POA: Insufficient documentation

## 2015-05-18 DIAGNOSIS — E669 Obesity, unspecified: Secondary | ICD-10-CM | POA: Insufficient documentation

## 2015-05-18 DIAGNOSIS — M940 Chondrocostal junction syndrome [Tietze]: Secondary | ICD-10-CM

## 2015-05-18 DIAGNOSIS — Z6841 Body Mass Index (BMI) 40.0 and over, adult: Secondary | ICD-10-CM | POA: Insufficient documentation

## 2015-05-18 LAB — POCT GLYCOSYLATED HEMOGLOBIN (HGB A1C): Hemoglobin A1C: 5.6

## 2015-05-18 NOTE — Progress Notes (Signed)
Subjective:  Patient ID: Crystal Burns, female    DOB: 08/26/1980  Age: 35 y.o. MRN: [id number]  CC: Establish Care   HPI Crystal Burns is a 35 year old female who is 15 weeks postpartum and has a history of preeclampsia, here today to establish care.  She informs me she has had episodes of elevated blood pressure with systolic rising up to 190 at her daughter's dental appointment on the another location had a systolic in the 140s and she has suffered from episodes of lightheadedness which she attributes to uncontrolled blood pressure She was recently seen at [facility] ED for hypertension at which time her blood pressure was 118/67.  Admits to being compliant with her antihypertensives and has lost a couple pounds and is trying to eat healthy. Complains of intermittent left-sided chest pains but denies shortness of breath or pedal edema.   Outpatient Prescriptions Prior to Visit  Medication Sig Dispense Refill  . amLODipine (NORVASC) 5 MG tablet Take 2 tablets (10 mg total) by mouth daily. 30 tablet 5  . carvedilol (COREG) 12.5 MG tablet Take 2 tablets (25 mg total) by mouth 2 (two) times daily with a meal. 120 tablet 5  . medroxyPROGESTERone (DEPO-PROVERA) 150 MG/ML injection Inject 1 mL (150 mg total) into the muscle every 3 (three) months. 150 mL 3  . penicillin v potassium (VEETID) 500 MG tablet Take 1 tablet (500 mg total) by mouth 4 (four) times daily. X 10 days 40 tablet 0  . chlorhexidine (PERIDEX) 0.12 % solution 15 mL swish and spit bid (Patient not taking: Reported on 05/18/2015) 480 mL 0  . ibuprofen (ADVIL,MOTRIN) 800 MG tablet Take 1 tablet (800 mg total) by mouth every 6 (six) hours as needed. (Patient not taking: Reported on 05/18/2015) 30 tablet 0  . Iron-FA-B Cmp-C-Biot-Probiotic (FUSION PLUS) CAPS Take 1 capsule by mouth daily before breakfast. (Patient not taking: Reported on 05/18/2015) 30 capsule 5  . hydrochlorothiazide (HYDRODIURIL) 50 MG tablet TAKE 1 TABLET  (50 MG TOTAL) BY MOUTH DAILY. (Patient not taking: Reported on 05/18/2015) 30 tablet 0   No facility-administered medications prior to visit.    ROS Review of Systems  Constitutional: Negative for activity change, appetite change and fatigue.  HENT: Negative for congestion, sinus pressure and sore throat.   Eyes: Negative for visual disturbance.  Respiratory: Negative for cough, chest tightness, shortness of breath and wheezing.   Cardiovascular: Positive for chest pain. Negative for palpitations.  Gastrointestinal: Negative for abdominal pain, constipation and abdominal distention.  Endocrine: Negative for polydipsia.  Genitourinary: Negative for dysuria and frequency.  Musculoskeletal: Negative for back pain and arthralgias.  Skin: Negative for rash.  Neurological: Negative for tremors, light-headedness and numbness.  Hematological: Does not bruise/bleed easily.  Psychiatric/Behavioral: Negative for behavioral problems and agitation.    Objective:  BP 112/76 mmHg  Pulse 76  Temp(Src) 98.3 F (36.8 C)  Resp 14  Ht  (1.6 m)  Wt 256 lb (116.121 kg)  BMI 45.36 kg/m2  SpO2 99%  Breastfeeding? No  BP/Weight 05/18/2015 05/12/2015 05/06/2015  Systolic BP 112 118 139  Diastolic BP 76 67 82  Wt. (Lbs) 256 269 -  BMI 45.36 47.66 -      Physical Exam  Constitutional: She is oriented to person, place, and time. She appears well-developed and well-nourished.  Cardiovascular: Normal rate, normal heart sounds and intact distal pulses.   No murmur heard. Pulmonary/Chest: Effort normal and breath sounds normal. She has no wheezes. She has  no rales. She exhibits tenderness (reproducible chest wall tenderness on the left chest wall).  Abdominal: Soft. Bowel sounds are normal. She exhibits no distension and no mass. There is no tenderness.  Musculoskeletal: Normal range of motion.  Neurological: She is alert and oriented to person, place, and time.     Assessment & Plan:   1.  Diabetes mellitus screening A1c is 5.6 - HgB A1c  2. Essential hypertension Blood pressure is controlled. I have explained to her that her lightheadedness could also be as a result of being hypertensive and so she is to keep a blood pressure log which will be reviewed at her next visit we will I will hold off on making any regimen changes at this time. Advised on low sodium, DASH diet  3. Costochondritis Patient educated on the use of NSAIDs as chest pain is noncardiac in nature.  4. Obesity Encouraged to increase physical activity-exercise 30 minutes for 5 days of the week, reduce portion sizes We'll follow up on this at her next office visit   No orders of the defined types were placed in this encounter.    Follow-up: Return in about 1 week (around 05/25/2015) for Follow-up of hypertension.   Jaclyn Shaggy MD

## 2015-05-18 NOTE — Progress Notes (Signed)
Establishing care today Concerned her blood pressure is not well controlled Reports intermittent sharp chest pains H/o postpartum eclampsia

## 2015-05-18 NOTE — Patient Instructions (Signed)
Hypertension Hypertension, commonly called high blood pressure, is when the force of blood pumping through your arteries is too strong. Your arteries are the blood vessels that carry blood from your heart throughout your body. A blood pressure reading consists of a higher number over a lower number, such as 110/72. The higher number (systolic) is the pressure inside your arteries when your heart pumps. The lower number (diastolic) is the pressure inside your arteries when your heart relaxes. Ideally you want your blood pressure below 120/80. Hypertension forces your heart to work harder to pump blood. Your arteries may become narrow or stiff. Having untreated or uncontrolled hypertension can cause heart attack, stroke, kidney disease, and other problems. RISK FACTORS Some risk factors for high blood pressure are controllable. Others are not.  Risk factors you cannot control include:   Race. You may be at higher risk if you are African American.  Age. Risk increases with age.  Gender. Men are at higher risk than women before age 45 years. After age 65, women are at higher risk than men. Risk factors you can control include:  Not getting enough exercise or physical activity.  Being overweight.  Getting too much fat, sugar, calories, or salt in your diet.  Drinking too much alcohol. SIGNS AND SYMPTOMS Hypertension does not usually cause signs or symptoms. Extremely high blood pressure (hypertensive crisis) may cause headache, anxiety, shortness of breath, and nosebleed. DIAGNOSIS To check if you have hypertension, your health care provider will measure your blood pressure while you are seated, with your arm held at the level of your heart. It should be measured at least twice using the same arm. Certain conditions can cause a difference in blood pressure between your right and left arms. A blood pressure reading that is higher than normal on one occasion does not mean that you need treatment. If  it is not clear whether you have high blood pressure, you may be asked to return on a different day to have your blood pressure checked again. Or, you may be asked to monitor your blood pressure at home for 1 or more weeks. TREATMENT Treating high blood pressure includes making lifestyle changes and possibly taking medicine. Living a healthy lifestyle can help lower high blood pressure. You may need to change some of your habits. Lifestyle changes may include:  Following the DASH diet. This diet is high in fruits, vegetables, and whole grains. It is low in salt, red meat, and added sugars.  Keep your sodium intake below 2,300 mg per day.  Getting at least 30-45 minutes of aerobic exercise at least 4 times per week.  Losing weight if necessary.  Not smoking.  Limiting alcoholic beverages.  Learning ways to reduce stress. Your health care provider may prescribe medicine if lifestyle changes are not enough to get your blood pressure under control, and if one of the following is true:  You are 18-59 years of age and your systolic blood pressure is above 140.  You are 60 years of age or older, and your systolic blood pressure is above 150.  Your diastolic blood pressure is above 90.  You have diabetes, and your systolic blood pressure is over 140 or your diastolic blood pressure is over 90.  You have kidney disease and your blood pressure is above 140/90.  You have heart disease and your blood pressure is above 140/90. Your personal target blood pressure may vary depending on your medical conditions, your age, and other factors. HOME CARE INSTRUCTIONS    Have your blood pressure rechecked as directed by your health care provider.   Take medicines only as directed by your health care provider. Follow the directions carefully. Blood pressure medicines must be taken as prescribed. The medicine does not work as well when you skip doses. Skipping doses also puts you at risk for  problems.  Do not smoke.   Monitor your blood pressure at home as directed by your health care provider. SEEK MEDICAL CARE IF:   You think you are having a reaction to medicines taken.  You have recurrent headaches or feel dizzy.  You have swelling in your ankles.  You have trouble with your vision. SEEK IMMEDIATE MEDICAL CARE IF:  You develop a severe headache or confusion.  You have unusual weakness, numbness, or feel faint.  You have severe chest or abdominal pain.  You vomit repeatedly.  You have trouble breathing. MAKE SURE YOU:   Understand these instructions.  Will watch your condition.  Will get help right away if you are not doing well or get worse.   This information is not intended to replace advice given to you by your health care provider. Make sure you discuss any questions you have with your health care provider.   Document Released: 03/25/2005 Document Revised: 08/09/2014 Document Reviewed: 01/15/2013 Elsevier Interactive Patient Education 2016 Elsevier Inc.  

## 2015-05-24 ENCOUNTER — Ambulatory Visit: Payer: Medicaid Other | Attending: Family Medicine | Admitting: Family Medicine

## 2015-05-24 ENCOUNTER — Encounter: Payer: Self-pay | Admitting: Family Medicine

## 2015-05-24 VITALS — BP 119/72 | HR 65 | Temp 98.1°F | Resp 15 | Ht 63.0 in | Wt 253.2 lb

## 2015-05-24 DIAGNOSIS — Z79899 Other long term (current) drug therapy: Secondary | ICD-10-CM | POA: Insufficient documentation

## 2015-05-24 DIAGNOSIS — R42 Dizziness and giddiness: Secondary | ICD-10-CM | POA: Insufficient documentation

## 2015-05-24 DIAGNOSIS — I10 Essential (primary) hypertension: Secondary | ICD-10-CM | POA: Insufficient documentation

## 2015-05-24 NOTE — Progress Notes (Signed)
Subjective:  Patient ID: Crystal Burns, female    DOB: Nov 03, 1980  Age: 35 y.o. MRN: [id number]  CC: Hypertension   HPI Crystal Burns is a 35 year old female with history of hypertension who comes into the clinic for a follow-up visit. At her last office visit she had complained of persisting hypertension outside of the office however her blood pressure in the clinic is always controlled.  She comes in with blood pressure readings from home revealing blood pressure in the 130-140/80-82 range; she complains of lightheadedness with is not associated with change in position and is intermittent. She describes this as "feeling bad"; denies headaches, nausea, vomiting, sinus symptoms, has ocassional blurry vision  She is working on losing weight and has lost 3 pounds since her last visit  Outpatient Prescriptions Prior to Visit  Medication Sig Dispense Refill  . amLODipine (NORVASC) 5 MG tablet Take 2 tablets (10 mg total) by mouth daily. 30 tablet 5  . carvedilol (COREG) 12.5 MG tablet Take 2 tablets (25 mg total) by mouth 2 (two) times daily with a meal. 120 tablet 5  . ibuprofen (ADVIL,MOTRIN) 800 MG tablet Take 1 tablet (800 mg total) by mouth every 6 (six) hours as needed. 30 tablet 0  . medroxyPROGESTERone (DEPO-PROVERA) 150 MG/ML injection Inject 1 mL (150 mg total) into the muscle every 3 (three) months. 150 mL 3  . penicillin v potassium (VEETID) 500 MG tablet Take 1 tablet (500 mg total) by mouth 4 (four) times daily. X 10 days 40 tablet 0  . chlorhexidine (PERIDEX) 0.12 % solution 15 mL swish and spit bid (Patient not taking: Reported on 05/18/2015) 480 mL 0  . Iron-FA-B Cmp-C-Biot-Probiotic (FUSION PLUS) CAPS Take 1 capsule by mouth daily before breakfast. (Patient not taking: Reported on 05/18/2015) 30 capsule 5   No facility-administered medications prior to visit.    ROS Review of Systems  Constitutional: Negative for activity change and appetite change.  HENT:  Negative for sinus pressure and sore throat.   Respiratory: Negative for chest tightness, shortness of breath and wheezing.   Cardiovascular: Negative for chest pain and palpitations.  Gastrointestinal: Negative for abdominal pain, constipation and abdominal distention.  Genitourinary: Negative.   Musculoskeletal: Negative.   Neurological: Positive for light-headedness.  Psychiatric/Behavioral: Negative for behavioral problems and dysphoric mood.    Objective:  BP 119/72 mmHg  Pulse 65  Temp(Src) 98.1 F (36.7 C)  Resp 15  Ht  (1.6 m)  Wt 253 lb 3.2 oz (114.851 kg)  BMI 44.86 kg/m2  SpO2 100%  BP/Weight 05/24/2015 05/18/2015 05/12/2015  Systolic BP 119 112 118  Diastolic BP 72 76 67  Wt. (Lbs) 253.2 256 269  BMI 44.86 45.36 47.66      Physical Exam  Constitutional: She is oriented to person, place, and time. She appears well-developed and well-nourished.  Morbidly obese  Cardiovascular: Normal rate, normal heart sounds and intact distal pulses.   No murmur heard. Pulmonary/Chest: Effort normal and breath sounds normal. She has no wheezes. She has no rales. She exhibits no tenderness.  Abdominal: Soft. Bowel sounds are normal. She exhibits no distension and no mass. There is no tenderness.  Musculoskeletal: Normal range of motion.  Neurological: She is alert and oriented to person, place, and time.   CBC    Component Value Date/Time   WBC 7.0 05/12/2015 1314   RBC 4.55 05/12/2015 1314   HGB 11.8* 05/12/2015 1314   HCT 36.5 05/12/2015 1314   PLT 312 05/12/2015  1314   MCV 80.2 05/12/2015 1314   MCH 25.9* 05/12/2015 1314   MCHC 32.3 05/12/2015 1314   RDW 15.5 05/12/2015 1314   LYMPHSABS 1.9 05/12/2015 1314   MONOABS 0.2 05/12/2015 1314   EOSABS 0.2 05/12/2015 1314   BASOSABS 0.0 05/12/2015 1314      Assessment & Plan:   1. Essential hypertension Blood pressure is controlled. Due to complaints of persisting lightheadedness I have advised her to reduce  amlodipine to 5 mg daily and will reassess at her next visit. Advised to bring in blood pressure looks for review  2. Lightheadedness CBCs negative for anemia If symptoms persist I will place him on meclizine for treatment of vertigo. She might also need a brain imaging if indicated.   No orders of the defined types were placed in this encounter.    Follow-up: Return in about 2 weeks (around 06/07/2015) for follow up on  hypertension.   Jaclyn Shaggy MD

## 2015-05-24 NOTE — Progress Notes (Signed)
Patient still reports "feeling terrible" She states she feels she is always hypertensive and feeling lightheaded and develops blurry vision on occasion She wonders if the medications have side effects that are bothering her or if she needs further blood work She has lost 3 lbs and reports low sodium diet

## 2015-05-24 NOTE — Patient Instructions (Signed)
Hypertension Hypertension, commonly called high blood pressure, is when the force of blood pumping through your arteries is too strong. Your arteries are the blood vessels that carry blood from your heart throughout your body. A blood pressure reading consists of a higher number over a lower number, such as 110/72. The higher number (systolic) is the pressure inside your arteries when your heart pumps. The lower number (diastolic) is the pressure inside your arteries when your heart relaxes. Ideally you want your blood pressure below 120/80. Hypertension forces your heart to work harder to pump blood. Your arteries may become narrow or stiff. Having untreated or uncontrolled hypertension can cause heart attack, stroke, kidney disease, and other problems. RISK FACTORS Some risk factors for high blood pressure are controllable. Others are not.  Risk factors you cannot control include:   Race. You may be at higher risk if you are African American.  Age. Risk increases with age.  Gender. Men are at higher risk than women before age 45 years. After age 65, women are at higher risk than men. Risk factors you can control include:  Not getting enough exercise or physical activity.  Being overweight.  Getting too much fat, sugar, calories, or salt in your diet.  Drinking too much alcohol. SIGNS AND SYMPTOMS Hypertension does not usually cause signs or symptoms. Extremely high blood pressure (hypertensive crisis) may cause headache, anxiety, shortness of breath, and nosebleed. DIAGNOSIS To check if you have hypertension, your health care provider will measure your blood pressure while you are seated, with your arm held at the level of your heart. It should be measured at least twice using the same arm. Certain conditions can cause a difference in blood pressure between your right and left arms. A blood pressure reading that is higher than normal on one occasion does not mean that you need treatment. If  it is not clear whether you have high blood pressure, you may be asked to return on a different day to have your blood pressure checked again. Or, you may be asked to monitor your blood pressure at home for 1 or more weeks. TREATMENT Treating high blood pressure includes making lifestyle changes and possibly taking medicine. Living a healthy lifestyle can help lower high blood pressure. You may need to change some of your habits. Lifestyle changes may include:  Following the DASH diet. This diet is high in fruits, vegetables, and whole grains. It is low in salt, red meat, and added sugars.  Keep your sodium intake below 2,300 mg per day.  Getting at least 30-45 minutes of aerobic exercise at least 4 times per week.  Losing weight if necessary.  Not smoking.  Limiting alcoholic beverages.  Learning ways to reduce stress. Your health care provider may prescribe medicine if lifestyle changes are not enough to get your blood pressure under control, and if one of the following is true:  You are 18-59 years of age and your systolic blood pressure is above 140.  You are 60 years of age or older, and your systolic blood pressure is above 150.  Your diastolic blood pressure is above 90.  You have diabetes, and your systolic blood pressure is over 140 or your diastolic blood pressure is over 90.  You have kidney disease and your blood pressure is above 140/90.  You have heart disease and your blood pressure is above 140/90. Your personal target blood pressure may vary depending on your medical conditions, your age, and other factors. HOME CARE INSTRUCTIONS    Have your blood pressure rechecked as directed by your health care provider.   Take medicines only as directed by your health care provider. Follow the directions carefully. Blood pressure medicines must be taken as prescribed. The medicine does not work as well when you skip doses. Skipping doses also puts you at risk for  problems.  Do not smoke.   Monitor your blood pressure at home as directed by your health care provider. SEEK MEDICAL CARE IF:   You think you are having a reaction to medicines taken.  You have recurrent headaches or feel dizzy.  You have swelling in your ankles.  You have trouble with your vision. SEEK IMMEDIATE MEDICAL CARE IF:  You develop a severe headache or confusion.  You have unusual weakness, numbness, or feel faint.  You have severe chest or abdominal pain.  You vomit repeatedly.  You have trouble breathing. MAKE SURE YOU:   Understand these instructions.  Will watch your condition.  Will get help right away if you are not doing well or get worse.   This information is not intended to replace advice given to you by your health care provider. Make sure you discuss any questions you have with your health care provider.   Document Released: 03/25/2005 Document Revised: 08/09/2014 Document Reviewed: 01/15/2013 Elsevier Interactive Patient Education 2016 Elsevier Inc.  

## 2015-05-29 ENCOUNTER — Inpatient Hospital Stay: Payer: Medicaid Other | Admitting: Family Medicine

## 2015-06-01 ENCOUNTER — Telehealth: Payer: Self-pay | Admitting: Family Medicine

## 2015-06-01 NOTE — Telephone Encounter (Signed)
Pt. Would like to know if it is possible to receive a prescription for an antibiotic to help with her tooth...pt. Is waiting for her medicaid to be able to see the dentist.....please follow up   °

## 2015-06-02 ENCOUNTER — Ambulatory Visit: Payer: Medicaid Other

## 2015-06-02 ENCOUNTER — Emergency Department (HOSPITAL_COMMUNITY)
Admission: EM | Admit: 2015-06-02 | Discharge: 2015-06-02 | Disposition: A | Discharge status: Home / Self Care | Payer: MEDICAID | Attending: Emergency Medicine | Admitting: Emergency Medicine

## 2015-06-02 ENCOUNTER — Encounter (HOSPITAL_COMMUNITY): Payer: Self-pay | Admitting: *Deleted

## 2015-06-02 DIAGNOSIS — Z79899 Other long term (current) drug therapy: Secondary | ICD-10-CM | POA: Insufficient documentation

## 2015-06-02 DIAGNOSIS — J45909 Unspecified asthma, uncomplicated: Secondary | ICD-10-CM | POA: Insufficient documentation

## 2015-06-02 DIAGNOSIS — E669 Obesity, unspecified: Secondary | ICD-10-CM | POA: Insufficient documentation

## 2015-06-02 DIAGNOSIS — Z87891 Personal history of nicotine dependence: Secondary | ICD-10-CM | POA: Insufficient documentation

## 2015-06-02 DIAGNOSIS — K029 Dental caries, unspecified: Secondary | ICD-10-CM | POA: Insufficient documentation

## 2015-06-02 DIAGNOSIS — I10 Essential (primary) hypertension: Secondary | ICD-10-CM | POA: Insufficient documentation

## 2015-06-02 DIAGNOSIS — Z8659 Personal history of other mental and behavioral disorders: Secondary | ICD-10-CM | POA: Insufficient documentation

## 2015-06-02 DIAGNOSIS — Z8632 Personal history of gestational diabetes: Secondary | ICD-10-CM | POA: Insufficient documentation

## 2015-06-02 DIAGNOSIS — R51 Headache: Secondary | ICD-10-CM | POA: Insufficient documentation

## 2015-06-02 DIAGNOSIS — Z8619 Personal history of other infectious and parasitic diseases: Secondary | ICD-10-CM | POA: Insufficient documentation

## 2015-06-02 MED ORDER — PENICILLIN V POTASSIUM 250 MG PO TABS: 250.0000 mg | ORAL_TABLET | Freq: Four times a day (QID) | ORAL | Status: AC

## 2015-06-02 MED ORDER — LISINOPRIL 20 MG PO TABS: 20.0000 mg | ORAL_TABLET | Freq: Every day | ORAL | Status: DC

## 2015-06-02 NOTE — ED Provider Notes (Addendum)
CSN: [id number]     Arrival date & time 06/02/15  0010 History   First MD Initiated Contact with Patient 06/02/15 [phone]     Chief Complaint  Patient presents with  . Hypertension     (Consider location/radiation/quality/duration/timing/severity/associated sxs/prior Treatment) HPI Comments: Patient is a 35 year old female with a history of hypertension most specifically after having her last child in October. She has had ongoing issues controlling her blood pressure. She states that she takes Coreg 2 tablets in the morning and 2 at night and 5 mg of amlodipine in the morning. However throughout the day she is getting dizziness, generalized feelings of malaise and then 2 hours before she takes her blood pressure medication in the evening her blood pressure gets high and she develops headaches. She has seen multiple physicians including those in the emergency room and [facility] She states nobody ever once to change her blood pressure medication but at her last doctor's appointment they decreased her amlodipine to 5 mg in the morning. She has not noticed any improvement in her symptoms and she is still getting hypertensive in the evenings. During her pregnancy she was on labetalol and tolerated that well.  She denies any chest pain, shortness of breath or unilateral weakness. Her blood pressure systolically goes to 150-160. Her blood pressure is elevated tonight and that is after taking her medication.  Patient is a 35 y.o. female presenting with hypertension. The history is provided by the patient.  Hypertension This is a recurrent problem.    Past Medical History  Diagnosis Date  . History of gonorrhea   . Gestational diabetes     2012  . Hypertension     2004  . Asthma     2010-only bothers pt during bad cold  . Depression     2010-had when mother passed away  . Pregnancy induced hypertension    Past Surgical History  Procedure Laterality Date  . Dilation and  curettage of uterus    . Cholecystectomy    . Wisdom tooth extraction     Family History  Problem Relation Age of Onset  . Hypertension Mother   . Diabetes Mother   . Cancer Mother   . Hypertension Father   . Diabetes Father   . Hyperlipidemia Father   . Arthritis    . Asthma    . Breast cancer    . Heart failure    . Congenital heart disease    . Depression    . Heart attack     Social History  Substance Use Topics  . Smoking status: Former Smoker    Types: Cigarettes    Quit date: 10/31/2010  . Smokeless tobacco: Never Used  . Alcohol Use: No   OB History    Gravida Para Term Preterm AB TAB SAB Ectopic Multiple Living   0 4 0 4 0 0 6     Review of Systems  All other systems reviewed and are negative.     Allergies  Shellfish allergy and Bee venom  Home Medications   Prior to Admission medications   Medication Sig Start Date End Date Taking? Authorizing Provider  amLODipine (NORVASC) 5 MG tablet Take 2 tablets (10 mg total) by mouth daily. 02/27/15   Brock Bad, MD  carvedilol (COREG) 12.5 MG tablet Take 2 tablets (25 mg total) by mouth 2 (two) times daily with a meal. 02/27/15   Brock Bad, MD  ibuprofen (  ADVIL,MOTRIN) 800 MG tablet Take 1 tablet (800 mg total) by mouth every 6 (six) hours as needed. 05/06/15   Domenick Gong, MD  medroxyPROGESTERone (DEPO-PROVERA) 150 MG/ML injection Inject 1 mL (150 mg total) into the muscle every 3 (three) months. 02/27/15   Rachelle A Denney, CNM  penicillin v potassium (VEETID) 500 MG tablet Take 1 tablet (500 mg total) by mouth 4 (four) times daily. X 10 days 05/06/15   Domenick Gong, MD   BP 160/84 mmHg  Pulse 60  Temp(Src) 98 F (36.7 C) (Oral)  Resp 18  Ht  (1.6 m)  Wt 255 lb 5 oz (115.809 kg)  BMI 45.24 kg/m2  SpO2 100% Physical Exam  Constitutional: She is oriented to person, place, and time. She appears well-developed and well-nourished. No distress.  Obese  HENT:  Head:  Normocephalic and atraumatic.  Mouth/Throat: Oropharynx is clear and moist.  Multiple dental caries and gingival disease  Eyes: Conjunctivae and EOM are normal. Pupils are equal, round, and reactive to light.  Neck: Normal range of motion. Neck supple.  Cardiovascular: Normal rate, regular rhythm and intact distal pulses.   No murmur heard. Pulmonary/Chest: Effort normal and breath sounds normal. No respiratory distress. She has no wheezes. She has no rales.  Abdominal: Soft. She exhibits no distension. There is no tenderness. There is no rebound and no guarding.  Musculoskeletal: Normal range of motion. She exhibits no edema or tenderness.  Neurological: She is alert and oriented to person, place, and time.  Skin: Skin is warm and dry. No rash noted. No erythema.  Psychiatric: She has a normal mood and affect. Her behavior is normal.  Nursing note and vitals reviewed.   ED Course  Procedures (including critical care time) Labs Review Labs Reviewed - No data to display  Imaging Review No results found. I have personally reviewed and evaluated these images and lab results as part of my medical decision-making.   EKG Interpretation None      MDM   Final diagnoses:  Essential hypertension    Patient presenting here with ongoing complaints of problems controlling her blood pressure. Despite taking her medication daily without missing doses and going on a no salt diet and losing 45 pounds patient continues to not feel well. In the morning after she takes her blood pressure medication she feels lightheaded with general malaise. Then a proximally 2-3 hours before she takes her evening dose of medication she starts developing a headache and hypertension. Patient had labs done this month which showed normal renal function and electrolytes. Discussed with patient if she had ever been on lisinopril in the past and she does not know. At this time recommended that she stop taking amlodipine as  it may be causing her symptoms. We'll start her on lisinopril that she is to take in the morning with her Coreg. Patient has no complaints concerning for ACS, stroke or end organ damage. Recommended that she give this 1-2 weeks and record her blood pressure and follow-[facility]. Also patient is requesting a refill of her penicillin for her dental issues until she can see a dentist.    Gwyneth Sprout, MD 06/02/15 1610  Gwyneth Sprout, MD 06/02/15 [phone]

## 2015-06-02 NOTE — ED Notes (Signed)
The pt is c/o her bp being too high today  But it has been going up and down since she was diagnosed with  Hypertension associated with her pregnancy 4 months ago when it became very high when she delivered  She takes bp meds

## 2015-06-02 NOTE — ED Notes (Addendum)
BP checked with pt's at home BP monitor on upper arm and it measured 164/116, cuff is slightly small for pt's upper arm so BP measured on pt's forearm and it measured 146/104

## 2015-06-02 NOTE — Discharge Instructions (Signed)
Hypertension Hypertension, commonly called high blood pressure, is when the force of blood pumping through your arteries is too strong. Your arteries are the blood vessels that carry blood from your heart throughout your body. A blood pressure reading consists of a higher number over a lower number, such as 110/72. The higher number (systolic) is the pressure inside your arteries when your heart pumps. The lower number (diastolic) is the pressure inside your arteries when your heart relaxes. Ideally you want your blood pressure below 120/80. Hypertension forces your heart to work harder to pump blood. Your arteries may become narrow or stiff. Having untreated or uncontrolled hypertension can cause heart attack, stroke, kidney disease, and other problems. RISK FACTORS Some risk factors for high blood pressure are controllable. Others are not.  Risk factors you cannot control include:   Race. You may be at higher risk if you are African American.  Age. Risk increases with age.  Gender. Men are at higher risk than women before age 45 years. After age 65, women are at higher risk than men. Risk factors you can control include:  Not getting enough exercise or physical activity.  Being overweight.  Getting too much fat, sugar, calories, or salt in your diet.  Drinking too much alcohol. SIGNS AND SYMPTOMS Hypertension does not usually cause signs or symptoms. Extremely high blood pressure (hypertensive crisis) may cause headache, anxiety, shortness of breath, and nosebleed. DIAGNOSIS To check if you have hypertension, your health care provider will measure your blood pressure while you are seated, with your arm held at the level of your heart. It should be measured at least twice using the same arm. Certain conditions can cause a difference in blood pressure between your right and left arms. A blood pressure reading that is higher than normal on one occasion does not mean that you need treatment. If  it is not clear whether you have high blood pressure, you may be asked to return on a different day to have your blood pressure checked again. Or, you may be asked to monitor your blood pressure at home for 1 or more weeks. TREATMENT Treating high blood pressure includes making lifestyle changes and possibly taking medicine. Living a healthy lifestyle can help lower high blood pressure. You may need to change some of your habits. Lifestyle changes may include:  Following the DASH diet. This diet is high in fruits, vegetables, and whole grains. It is low in salt, red meat, and added sugars.  Keep your sodium intake below 2,300 mg per day.  Getting at least 30-45 minutes of aerobic exercise at least 4 times per week.  Losing weight if necessary.  Not smoking.  Limiting alcoholic beverages.  Learning ways to reduce stress. Your health care provider may prescribe medicine if lifestyle changes are not enough to get your blood pressure under control, and if one of the following is true:  You are 18-59 years of age and your systolic blood pressure is above 140.  You are 60 years of age or older, and your systolic blood pressure is above 150.  Your diastolic blood pressure is above 90.  You have diabetes, and your systolic blood pressure is over 140 or your diastolic blood pressure is over 90.  You have kidney disease and your blood pressure is above 140/90.  You have heart disease and your blood pressure is above 140/90. Your personal target blood pressure may vary depending on your medical conditions, your age, and other factors. HOME CARE INSTRUCTIONS    Have your blood pressure rechecked as directed by your health care provider.   Take medicines only as directed by your health care provider. Follow the directions carefully. Blood pressure medicines must be taken as prescribed. The medicine does not work as well when you skip doses. Skipping doses also puts you at risk for  problems.  Do not smoke.   Monitor your blood pressure at home as directed by your health care provider. SEEK MEDICAL CARE IF:   You think you are having a reaction to medicines taken.  You have recurrent headaches or feel dizzy.  You have swelling in your ankles.  You have trouble with your vision. SEEK IMMEDIATE MEDICAL CARE IF:  You develop a severe headache or confusion.  You have unusual weakness, numbness, or feel faint.  You have severe chest or abdominal pain.  You vomit repeatedly.  You have trouble breathing. MAKE SURE YOU:   Understand these instructions.  Will watch your condition.  Will get help right away if you are not doing well or get worse.   This information is not intended to replace advice given to you by your health care provider. Make sure you discuss any questions you have with your health care provider.   Document Released: 03/25/2005 Document Revised: 08/09/2014 Document Reviewed: 01/15/2013 Elsevier Interactive Patient Education 2016 [employer]  

## 2015-06-05 ENCOUNTER — Ambulatory Visit: Payer: Medicaid Other

## 2015-06-05 ENCOUNTER — Ambulatory Visit: Payer: Medicaid Other | Attending: Family Medicine | Admitting: Family Medicine

## 2015-06-05 ENCOUNTER — Encounter: Payer: Self-pay | Admitting: Family Medicine

## 2015-06-05 VITALS — BP 113/78 | HR 68 | Temp 98.4°F | Resp 15 | Ht 63.0 in | Wt 250.2 lb

## 2015-06-05 DIAGNOSIS — R42 Dizziness and giddiness: Secondary | ICD-10-CM | POA: Insufficient documentation

## 2015-06-05 DIAGNOSIS — Z3049 Encounter for surveillance of other contraceptives: Secondary | ICD-10-CM

## 2015-06-05 DIAGNOSIS — Z3042 Encounter for surveillance of injectable contraceptive: Secondary | ICD-10-CM

## 2015-06-05 DIAGNOSIS — Z888 Allergy status to other drugs, medicaments and biological substances status: Secondary | ICD-10-CM | POA: Insufficient documentation

## 2015-06-05 DIAGNOSIS — I10 Essential (primary) hypertension: Secondary | ICD-10-CM | POA: Insufficient documentation

## 2015-06-05 DIAGNOSIS — Z87891 Personal history of nicotine dependence: Secondary | ICD-10-CM | POA: Insufficient documentation

## 2015-06-05 DIAGNOSIS — J45909 Unspecified asthma, uncomplicated: Secondary | ICD-10-CM | POA: Insufficient documentation

## 2015-06-05 DIAGNOSIS — Z79899 Other long term (current) drug therapy: Secondary | ICD-10-CM | POA: Insufficient documentation

## 2015-06-05 DIAGNOSIS — F329 Major depressive disorder, single episode, unspecified: Secondary | ICD-10-CM | POA: Insufficient documentation

## 2015-06-05 MED ORDER — MEDROXYPROGESTERONE ACETATE 150 MG/ML IM SUSP
150.0000 mg | Freq: Once | INTRAMUSCULAR | Status: AC
  Administered 2015-06-05: 150 mg via INTRAMUSCULAR

## 2015-06-05 NOTE — Progress Notes (Signed)
Follow up on HTN Patient went to ED who changed med regimen -she reports no improvement Patient asked me to check her pressure in each arm.  Each reading was normal-she became angry saying that that cannot be right because she checks it at home and it is always high.  She states she only has low reading in our office

## 2015-06-05 NOTE — Progress Notes (Signed)
CC: Follow up on Hypertension HPI: Crystal Burns is a 35 y.o. female with a history of obesity and hypertension who comes in for follow-up of hypertension. At her last visit she had complained of persistently elevated blood pressures at home and associated lightheadedness but her blood pressures in the office are usually in the 110s systolic and I had reduced amlodipine from 10 mg to 5 mg.  She presented to the ED 3 days ago with complaints of elevated blood pressure and dizziness and her blood pressure at the time was 160/84; amlodipine was discontinued and she was placed on lisinopril 20 mg to take in addition to her carvedilol 25 mg twice daily.  Today she complains she is no better and is afraid she would have a heart attack or stroke and feels like she would pass out at other times. Her blood pressure log has been reviewed by me and this indicates morning blood pressures in the 119 systolic and elevated evening blood pressures ranging 154-168/101-107. She tells me she wishes she was never taken off the amlodipine by the ED physician as she feels that worked better. She states she has been working on lifestyle changes has lost weight and is on a low-sodium diet. She seems to be anxious and admits to being stressed as she has 6 children and works between the hours of 11 am to 9pm.  Allergies  Allergen Reactions  . Shellfish Allergy Anaphylaxis  . Bee Venom Hives and Swelling   Past Medical History  Diagnosis Date  . History of gonorrhea   . Gestational diabetes     2012  . Hypertension     2004  . Asthma     2010-only bothers pt during bad cold  . Depression     2010-had when mother passed away  . Pregnancy induced hypertension    Current Outpatient Prescriptions on File Prior to Visit  Medication Sig Dispense Refill  . carvedilol (COREG) 12.5 MG tablet Take 2 tablets (25 mg total) by mouth 2 (two) times daily with a meal. 120 tablet 5  . ibuprofen (ADVIL,MOTRIN) 800 MG  tablet Take 1 tablet (800 mg total) by mouth every 6 (six) hours as needed. 30 tablet 0  . lisinopril (PRINIVIL,ZESTRIL) 20 MG tablet Take 1 tablet (20 mg total) by mouth daily. 30 tablet 0  . medroxyPROGESTERone (DEPO-PROVERA) 150 MG/ML injection Inject 1 mL (150 mg total) into the muscle every 3 (three) months. 150 mL 3  . penicillin v potassium (VEETID) 250 MG tablet Take 1 tablet (250 mg total) by mouth 4 (four) times daily. 40 tablet 0  . amLODipine (NORVASC) 5 MG tablet Take 2 tablets (10 mg total) by mouth daily. (Patient not taking: Reported on 06/05/2015) 30 tablet 5   No current facility-administered medications on file prior to visit.   Family History  Problem Relation Age of Onset  . Hypertension Mother   . Diabetes Mother   . Cancer Mother   . Hypertension Father   . Diabetes Father   . Hyperlipidemia Father   . Arthritis    . Asthma    . Breast cancer    . Heart failure    . Congenital heart disease    . Depression    . Heart attack     Social History   Social History  . Marital Status: Divorced    Spouse Name: N/A  . Number of Children: N/A  . Years of Education: N/A   Occupational History  .  Not on file.   Social History Main Topics  . Smoking status: Former Smoker    Types: Cigarettes    Quit date: 10/31/2010  . Smokeless tobacco: Never Used  . Alcohol Use: No  . Drug Use: No  . Sexual Activity:    Partners: Male   Other Topics Concern  . Not on file   Social History Narrative    Review of Systems: Constitutional: Negative for fever, chills, diaphoresis, activity change, appetite change and fatigue. HENT: Negative for ear pain, nosebleeds, congestion, facial swelling, rhinorrhea, neck pain, neck stiffness and ear discharge.  Eyes: Negative for pain, discharge, redness, itching and visual disturbance. Respiratory: Negative for cough, choking, chest tightness, shortness of breath, wheezing and stridor.  Cardiovascular: Negative for chest pain,  palpitations and leg swelling. Gastrointestinal: Negative for abdominal distention. Genitourinary: Negative for dysuria, urgency, frequency, hematuria, flank pain, decreased urine volume, difficulty urinating and dyspareunia.  Musculoskeletal: Negative for back pain, joint swelling, arthralgias and gait problem. Neurological: Negative for dizziness, tremors, seizures, syncope, facial asymmetry, speech difficulty, weakness, positive for light-headedness Hematological: Negative for adenopathy. Does not bruise/bleed easily. Psychiatric/Behavioral: Negative for hallucinations, behavioral problems, confusion, dysphoric mood, decreased concentration and agitation.    Objective:   Filed Vitals:   06/05/15 1029  BP: 113/78  Pulse: 68  Temp: 98.4 F (36.9 C)  Resp: 15    Physical Exam: Constitutional: Patient appears well-developed and well-nourished. No distress. HENT: Normocephalic, atraumatic, External right and left ear normal. Oropharynx is clear and moist.  Eyes: Conjunctivae and EOM are normal. PERRLA, no scleral icterus. Neck: Normal ROM. Neck supple. No JVD. No tracheal deviation. No thyromegaly. CVS: RRR, S1/S2 +, no murmurs, no gallops, no carotid bruit.  Pulmonary: Effort and breath sounds normal, no stridor, rhonchi, wheezes, rales.  Abdominal: Soft. BS +,  no distension, tenderness, rebound or guarding.  Musculoskeletal: Normal range of motion. No edema and no tenderness.  Lymphadenopathy: No lymphadenopathy noted, cervical, inguinal or axillary Neuro: Alert. Normal reflexes, muscle tone coordination. No cranial nerve deficit. Skin: Skin is warm and dry. No rash noted. Not diaphoretic. No erythema. No pallor. Psychiatric: Normal mood and affect. Behavior, judgment, thought content normal.  Lab Results  Component Value Date   WBC 7.0 05/12/2015   HGB 11.8* 05/12/2015   HCT 36.5 05/12/2015   MCV 80.2 05/12/2015   PLT 312 05/12/2015   Lab Results  Component Value Date    CREATININE 0.61 05/12/2015   BUN 10 05/12/2015   NA 139 05/12/2015   K 4.6 05/12/2015   CL 104 05/12/2015   CO2 26 05/12/2015    Lab Results  Component Value Date   HGBA1C 5.60 05/18/2015   Lipid Panel  No results found for: CHOL, TRIG, HDL, CHOLHDL, VLDL, LDLCALC     Assessment and plan:  1. Essential hypertension Blood pressure is controlled- I have repeated it myself manually and obtained 110/60 Patient used her home blood pressure monitor which was 145/99 Review of blood pressure indicates elevated blood pressures more in the evening and so I have advised her to stagger her medicines Carvedilol at 9 AM and 6 PM; lisinopril at noon Advised to obtain an afternoon appointment for next follow-up and if blood pressure is elevated at that visit I reveal at hydrochlorothiazide for an afternoon dose. Lifestyle modification including weight loss and low-sodium diet.   2. Lightheadedness CBCs negative for anemia EKG from 05/14/15-unremarkable. If symptoms persist I will place him on meclizine for treatment of vertigo. She might also  need a brain imaging if indicated.  This note has been created with Education officer, environmental. Any transcriptional errors are unintentional.         Jaclyn Shaggy, MD. [facility] [phone] 06/05/2015, 10:50 AM

## 2015-06-07 MED FILL — ?CARVEDILOL 12.5 MG TABLET: 12.5 | 30 days supply | Qty: 120 | Fill #0 | Status: TO

## 2015-06-08 ENCOUNTER — Telehealth: Payer: Self-pay | Admitting: Family Medicine

## 2015-06-08 ENCOUNTER — Encounter: Payer: Self-pay | Admitting: Pediatrics

## 2015-06-08 NOTE — Telephone Encounter (Signed)
Pt. Would like to know if it is possible to receive a prescription for an antibiotic to help with her tooth...pt. Is waiting for her medicaid to be able to see the dentist..Marland KitchenMarland KitchenMarland Kitchenplease follow up

## 2015-06-09 ENCOUNTER — Telehealth: Payer: Self-pay | Admitting: *Deleted

## 2015-06-09 ENCOUNTER — Inpatient Hospital Stay: Payer: Medicaid Other | Admitting: Family Medicine

## 2015-06-09 DIAGNOSIS — K0530 Chronic periodontitis, unspecified: Secondary | ICD-10-CM

## 2015-06-09 NOTE — Telephone Encounter (Signed)
Attempted to contact patient regarding her concern.  Her voicemail message states that it is full and no message can be left.

## 2015-06-09 NOTE — Telephone Encounter (Signed)
Pt. Would like to know if it is possible to receive a prescription for an antibiotic to help with her tooth...pt. Is waiting for her medicaid to be able to see the dentist..Marland Kitchen.Marland Kitchen.Marland Kitchen.please follow up

## 2015-06-09 NOTE — Telephone Encounter (Signed)
Patient is calling stating that her bp is low after taking medications, she states she feels light headed and is concerned the medication is not helping her.... Please advise patient.

## 2015-06-12 MED ORDER — CHLORHEXIDINE GLUCONATE 0.12% ORAL RINSE (MEDLINE KIT)
15.0000 mL | Freq: Two times a day (BID) | OROMUCOSAL | Status: DC
Start: 2015-06-12 — End: 2015-08-18

## 2015-06-12 NOTE — Telephone Encounter (Signed)
Patient asking for abx for her teeth that need to be extracted.  She states she cannot get them pulled until she gets her medicaid. The abx she had from ED have run out. Explained that MD out of office today but would route message to her. Patient verbalized understanding.

## 2015-06-12 NOTE — Telephone Encounter (Signed)
Patient called states is having tooth pain and needs antibiotics, please f/u with patient

## 2015-06-12 NOTE — Telephone Encounter (Signed)
peridex antibiotic rinse which is a safer long term option rx printed, please call patient to pick up

## 2015-06-13 NOTE — Telephone Encounter (Signed)
Patient will pick up at the clinic and was given low cost dentist list

## 2015-06-15 ENCOUNTER — Telehealth: Payer: Self-pay | Admitting: Family Medicine

## 2015-06-15 NOTE — Telephone Encounter (Signed)
Pt. Returned call. Pt. Stated she can be reached at 3 p.m. Please f/u

## 2015-06-15 NOTE — Telephone Encounter (Signed)
Pt is concern because her bp is low and heart rate   130/67 pulse 53 and she is feeling weak, tired, tingling  and she thinks is the medicine for blood pressure  Please, call her thank you

## 2015-06-15 NOTE — Telephone Encounter (Signed)
Pt is concern because her bp is low and heart rate 130/67 pulse 53 and she is feeling weak, tired, tingling and she thinks is the medicine for blood pressure. Pt. Stated she is feeling light headed, weak and has not been feeling good. Pt. Also stated that her PCP stated that if her blood pressure was low she was going to change her medication. Please f/u with pt.

## 2015-06-15 NOTE — Telephone Encounter (Signed)
Left message for patient

## 2015-06-16 ENCOUNTER — Encounter (HOSPITAL_COMMUNITY): Payer: Self-pay | Admitting: Emergency Medicine

## 2015-06-16 ENCOUNTER — Emergency Department (INDEPENDENT_AMBULATORY_CARE_PROVIDER_SITE_OTHER)
Admission: EM | Admit: 2015-06-16 | Discharge: 2015-06-16 | Disposition: A | Discharge status: Home / Self Care | Payer: Self-pay | Source: Home / Self Care | Attending: Emergency Medicine | Admitting: Emergency Medicine

## 2015-06-16 DIAGNOSIS — T887XXA Unspecified adverse effect of drug or medicament, initial encounter: Secondary | ICD-10-CM

## 2015-06-16 DIAGNOSIS — T50905A Adverse effect of unspecified drugs, medicaments and biological substances, initial encounter: Secondary | ICD-10-CM

## 2015-06-16 DIAGNOSIS — K0889 Other specified disorders of teeth and supporting structures: Secondary | ICD-10-CM

## 2015-06-16 DIAGNOSIS — R002 Palpitations: Secondary | ICD-10-CM

## 2015-06-16 DIAGNOSIS — I10 Essential (primary) hypertension: Secondary | ICD-10-CM

## 2015-06-16 LAB — POCT I-STAT, CHEM 8
BUN: 13 mg/dL (ref 6–20)
CHLORIDE: 105 mmol/L (ref 101–111)
CREATININE: 0.7 mg/dL (ref 0.44–1.00)
Calcium, Ion: 1.32 mmol/L — ABNORMAL HIGH (ref 1.12–1.23)
GLUCOSE: 83 mg/dL (ref 65–99)
HEMATOCRIT: 37 % (ref 36.0–46.0)
HEMOGLOBIN: 12.6 g/dL (ref 12.0–15.0)
POTASSIUM: 4.2 mmol/L (ref 3.5–5.1)
Sodium: 140 mmol/L (ref 135–145)
TCO2: 28 mmol/L (ref 0–100)

## 2015-06-16 MED ORDER — LISINOPRIL 10 MG PO TABS: 10.0000 mg | ORAL_TABLET | Freq: Every day | ORAL | Status: DC

## 2015-06-16 MED ORDER — AMOXICILLIN 500 MG PO CAPS: 1000.0000 mg | ORAL_CAPSULE | Freq: Two times a day (BID) | ORAL | Status: DC

## 2015-06-16 MED ORDER — CARVEDILOL 12.5 MG PO TABS: 12.5000 mg | ORAL_TABLET | Freq: Two times a day (BID) | ORAL | Status: DC

## 2015-06-16 NOTE — Discharge Instructions (Signed)
Your EKG is normal today except for a slightly low heart rate. I think the reason you are feeling bad is that we are overtreating you. Only take 1 carvedilol twice a day. Cut the lisinopril tablets in half and take 10 mg once a day. Continue to monitor your blood pressure at home. If it is over 140/90 for 3 days, add back the half pill of lisinopril to take 20 mg daily. If your blood pressure is greater than 180/110, please go to the hospital.  I sent in a prescription for amoxicillin for your tooth. I did put a refill on there if the pain recurs before you can see a dentist.  Follow-up as needed.

## 2015-06-16 NOTE — ED Notes (Signed)
Right "wisdom tooth" pain and right ear pain

## 2015-06-16 NOTE — ED Provider Notes (Signed)
CSN: [id number]     Arrival date & time 06/16/15  1334 History   First MD Initiated Contact with Patient 06/16/15 1445     Chief Complaint  Patient presents with  . Dental Pain  . Otalgia   (Consider location/radiation/quality/duration/timing/severity/associated sxs/prior Treatment) HPI  She is a 35 year old woman here for evaluation of cardiac concerns. She states she had postpartum preeclampsia with her last pregnancy. Since that time, she has had trouble with her blood pressure. About 10 days ago her medications were changed and she is now taking carvedilol 25 mg twice a day and lisinopril 20 mg once a day. She states since this medication change, she has been feeling terrible. She states her blood pressure has been low on her monitor at home. She states it'll be in the 120s when she wakes up, before taking her medications. After taking her medications, it will drop down to the 100-110s.  She states she will feel dizzy and fatigued after taking the medication.  She also reports feeling palpitations the last several days. She states she will feel her heartbeat for a few minutes then will ease up and then she will feel it again. She denies any chest pain.  She has changed her diet to be salt free and increase fruits and vegetables. She has lost about 50 pounds in the last several months.  She also reports pain in her right lower wisdom tooth. This is been an ongoing issue. She recently started a new job so lost her Medicaid, but is still waiting for her job insurance to kick in. She will not be able to see a dentist until the end of the month.  Past Medical History  Diagnosis Date  . History of gonorrhea   . Gestational diabetes     2012  . Hypertension     2004  . Asthma     2010-only bothers pt during bad cold  . Depression     2010-had when mother passed away  . Pregnancy induced hypertension    Past Surgical History  Procedure Laterality Date  . Dilation and curettage of uterus      . Cholecystectomy    . Wisdom tooth extraction     Family History  Problem Relation Age of Onset  . Hypertension Mother   . Diabetes Mother   . Cancer Mother   . Hypertension Father   . Diabetes Father   . Hyperlipidemia Father   . Arthritis    . Asthma    . Breast cancer    . Heart failure    . Congenital heart disease    . Depression    . Heart attack     Social History  Substance Use Topics  . Smoking status: Former Smoker    Types: Cigarettes    Quit date: 10/31/2010  . Smokeless tobacco: Never Used  . Alcohol Use: No   OB History    Gravida Para Term Preterm AB TAB SAB Ectopic Multiple Living   0 4 0 4 0 0 6     Review of Systems As in history of present illness Allergies  Shellfish allergy and Bee venom  Home Medications   Prior to Admission medications   Medication Sig Start Date End Date Taking? Authorizing Provider  amoxicillin (AMOXIL) 500 MG capsule Take 2 capsules (1,000 mg total) by mouth 2 (two) times daily. 06/16/15   Charm Rings, MD  carvedilol (COREG) 12.5 MG tablet Take 1 tablet (12.5 mg  total) by mouth 2 (two) times daily with a meal. 06/16/15   Charm RingsErin J Peta Peachey, MD  chlorhexidine gluconate (PERIDEX) 0.12 % solution Use as directed 15 mLs in the mouth or throat 2 (two) times daily. Swish for 30 seconds and spit twice daily after brushing 06/12/15   Dessa PhiJosalyn Funches, MD  ibuprofen (ADVIL,MOTRIN) 800 MG tablet Take 1 tablet (800 mg total) by mouth every 6 (six) hours as needed. 05/06/15   Domenick GongAshley Mortenson, MD  lisinopril (PRINIVIL,ZESTRIL) 10 MG tablet Take 1 tablet (10 mg total) by mouth daily. 06/16/15   Charm RingsErin J Laymon Stockert, MD  medroxyPROGESTERone (DEPO-PROVERA) 150 MG/ML injection Inject 1 mL (150 mg total) into the muscle every 3 (three) months. Patient not taking: Reported on 06/16/2015 02/27/15   Roe Coombsachelle A Denney, CNM   Meds Ordered and Administered this Visit  Medications - No data to display  BP 144/87 mmHg  Pulse 56  Temp(Src) 98.3 F (36.8 C)  (Oral)  Resp 18  SpO2 100% No data found.   Physical Exam  Constitutional: She is oriented to person, place, and time. She appears well-developed and well-nourished. No distress.  HENT:  Mouth/Throat:    TMs normal bilaterally  Cardiovascular: Normal rate, regular rhythm and normal heart sounds.   No murmur heard. Pulmonary/Chest: Effort normal and breath sounds normal. No respiratory distress. She has no wheezes. She has no rales.  Neurological: She is alert and oriented to person, place, and time.    ED Course  Procedures (including critical care time) ED ECG REPORT   Date: 06/16/2015  Rate: 56  Rhythm: sinus bradycardia  QRS Axis: normal  Intervals: normal  ST/T Wave abnormalities: normal  Conduction Disutrbances:none  Narrative Interpretation: sinus bradycardia with poor r-wave progression, unchanged from previous  Old EKG Reviewed: unchanged  I have personally reviewed the EKG tracing and agree with the computerized printout as noted.  Labs Review Labs Reviewed  POCT I-STAT, CHEM 8 - Abnormal; Notable for the following:    Calcium, Ion 1.32 (*)    All other components within normal limits    Imaging Review No results found.   MDM   1. Pain, dental   2. Palpitations   3. Medication side effect, initial encounter   4. HTN (hypertension), benign    Amoxicillin for dental pain. I did provide a refill to use if pain recurs before she can see the dentist.  I suspect her dizziness and palpitations are due to overtreatment. Her home blood pressure numbers are good to slightly low. I have cut back both the carvedilol and the lisinopril. She will continue to monitor her blood pressure at home. If it is greater than 140/90 for 3 days, she will add back the lisinopril. Follow-up as needed.    Charm RingsErin J Adiya Selmer, MD 06/16/15 [phone]

## 2015-06-19 ENCOUNTER — Ambulatory Visit: Payer: Medicaid Other | Admitting: Clinical

## 2015-06-19 ENCOUNTER — Ambulatory Visit: Payer: Self-pay | Attending: Family Medicine | Admitting: Family Medicine

## 2015-06-19 ENCOUNTER — Encounter: Payer: Self-pay | Admitting: Family Medicine

## 2015-06-19 VITALS — BP 120/75 | HR 60 | Temp 98.6°F | Resp 15 | Ht 63.0 in | Wt 254.2 lb

## 2015-06-19 DIAGNOSIS — F419 Anxiety disorder, unspecified: Secondary | ICD-10-CM | POA: Insufficient documentation

## 2015-06-19 DIAGNOSIS — J45909 Unspecified asthma, uncomplicated: Secondary | ICD-10-CM | POA: Insufficient documentation

## 2015-06-19 DIAGNOSIS — I10 Essential (primary) hypertension: Secondary | ICD-10-CM | POA: Insufficient documentation

## 2015-06-19 DIAGNOSIS — F411 Generalized anxiety disorder: Secondary | ICD-10-CM

## 2015-06-19 DIAGNOSIS — Z87891 Personal history of nicotine dependence: Secondary | ICD-10-CM | POA: Insufficient documentation

## 2015-06-19 DIAGNOSIS — Z79899 Other long term (current) drug therapy: Secondary | ICD-10-CM | POA: Insufficient documentation

## 2015-06-19 DIAGNOSIS — R42 Dizziness and giddiness: Secondary | ICD-10-CM | POA: Insufficient documentation

## 2015-06-19 DIAGNOSIS — F329 Major depressive disorder, single episode, unspecified: Secondary | ICD-10-CM | POA: Insufficient documentation

## 2015-06-19 NOTE — Patient Instructions (Signed)
Hypertension Hypertension, commonly called high blood pressure, is when the force of blood pumping through your arteries is too strong. Your arteries are the blood vessels that carry blood from your heart throughout your body. A blood pressure reading consists of a higher number over a lower number, such as 110/72. The higher number (systolic) is the pressure inside your arteries when your heart pumps. The lower number (diastolic) is the pressure inside your arteries when your heart relaxes. Ideally you want your blood pressure below 120/80. Hypertension forces your heart to work harder to pump blood. Your arteries may become narrow or stiff. Having untreated or uncontrolled hypertension can cause heart attack, stroke, kidney disease, and other problems. RISK FACTORS Some risk factors for high blood pressure are controllable. Others are not.  Risk factors you cannot control include:   Race. You may be at higher risk if you are African American.  Age. Risk increases with age.  Gender. Men are at higher risk than women before age 45 years. After age 65, women are at higher risk than men. Risk factors you can control include:  Not getting enough exercise or physical activity.  Being overweight.  Getting too much fat, sugar, calories, or salt in your diet.  Drinking too much alcohol. SIGNS AND SYMPTOMS Hypertension does not usually cause signs or symptoms. Extremely high blood pressure (hypertensive crisis) may cause headache, anxiety, shortness of breath, and nosebleed. DIAGNOSIS To check if you have hypertension, your health care provider will measure your blood pressure while you are seated, with your arm held at the level of your heart. It should be measured at least twice using the same arm. Certain conditions can cause a difference in blood pressure between your right and left arms. A blood pressure reading that is higher than normal on one occasion does not mean that you need treatment. If  it is not clear whether you have high blood pressure, you may be asked to return on a different day to have your blood pressure checked again. Or, you may be asked to monitor your blood pressure at home for 1 or more weeks. TREATMENT Treating high blood pressure includes making lifestyle changes and possibly taking medicine. Living a healthy lifestyle can help lower high blood pressure. You may need to change some of your habits. Lifestyle changes may include:  Following the DASH diet. This diet is high in fruits, vegetables, and whole grains. It is low in salt, red meat, and added sugars.  Keep your sodium intake below 2,300 mg per day.  Getting at least 30-45 minutes of aerobic exercise at least 4 times per week.  Losing weight if necessary.  Not smoking.  Limiting alcoholic beverages.  Learning ways to reduce stress. Your health care provider may prescribe medicine if lifestyle changes are not enough to get your blood pressure under control, and if one of the following is true:  You are 18-59 years of age and your systolic blood pressure is above 140.  You are 60 years of age or older, and your systolic blood pressure is above 150.  Your diastolic blood pressure is above 90.  You have diabetes, and your systolic blood pressure is over 140 or your diastolic blood pressure is over 90.  You have kidney disease and your blood pressure is above 140/90.  You have heart disease and your blood pressure is above 140/90. Your personal target blood pressure may vary depending on your medical conditions, your age, and other factors. HOME CARE INSTRUCTIONS    Have your blood pressure rechecked as directed by your health care provider.   Take medicines only as directed by your health care provider. Follow the directions carefully. Blood pressure medicines must be taken as prescribed. The medicine does not work as well when you skip doses. Skipping doses also puts you at risk for  problems.  Do not smoke.   Monitor your blood pressure at home as directed by your health care provider. SEEK MEDICAL CARE IF:   You think you are having a reaction to medicines taken.  You have recurrent headaches or feel dizzy.  You have swelling in your ankles.  You have trouble with your vision. SEEK IMMEDIATE MEDICAL CARE IF:  You develop a severe headache or confusion.  You have unusual weakness, numbness, or feel faint.  You have severe chest or abdominal pain.  You vomit repeatedly.  You have trouble breathing. MAKE SURE YOU:   Understand these instructions.  Will watch your condition.  Will get help right away if you are not doing well or get worse.   This information is not intended to replace advice given to you by your health care provider. Make sure you discuss any questions you have with your health care provider.   Document Released: 03/25/2005 Document Revised: 08/09/2014 Document Reviewed: 01/15/2013 Elsevier Interactive Patient Education 2016 Elsevier Inc.  

## 2015-06-19 NOTE — Progress Notes (Signed)
Patient here to follow up on her HTN She went to Urgent Care and they changed her medication regimen

## 2015-06-20 DIAGNOSIS — F419 Anxiety disorder, unspecified: Secondary | ICD-10-CM | POA: Insufficient documentation

## 2015-06-20 NOTE — Progress Notes (Signed)
Subjective:    Patient ID: Crystal Burns, female    DOB: 30-Sep-1980, 35 y.o.   MRN: [id number]  HPI This is a 35 year old female with a history of hypertension who comes in for follow-up of her blood pressure. She has had multiple changes in antihypertensive regimen due to several ED and urgent care visit complaining sometimes of elevated blood pressure and other times of low blood pressure. At her last visit she was persuaded her blood pressure had been elevated from her home bp readings and wanted me to increase the dose of her antihypertensive despite obtaining a normal blood pressure in the clinic. A couple of days later she presented to urgent care complaining of lightheadedness and the doses of her antihypertensives were reduced. She has called the clinic on multiple occasions complaining about her antihypertensive regimen.  Today she informs me that she has been lightheaded since her last urgent care visit and review of her blood pressure log indicates systolic pressures in the 127 - 154 diastolic of 79-81; she checks her blood pressure every 2 hours as. Admits to being stressed from caring for her 6 children but is also concerned because she sometimes has palpitations. I have repeated her blood pressure myself manually and obtain 110/80. She denies vertigo, nausea vomiting or blurry vision.  Past Medical History  Diagnosis Date  . History of gonorrhea   . Gestational diabetes     2012  . Hypertension     2004  . Asthma     2010-only bothers pt during bad cold  . Depression     2010-had when mother passed away  . Pregnancy induced hypertension     Past Surgical History  Procedure Laterality Date  . Dilation and curettage of uterus    . Cholecystectomy    . Wisdom tooth extraction      Social History   Social History  . Marital Status: Divorced    Spouse Name: N/A  . Number of Children: N/A  . Years of Education: N/A   Occupational History  . Not on file.    Social History Main Topics  . Smoking status: Former Smoker    Types: Cigarettes    Quit date: 10/31/2010  . Smokeless tobacco: Never Used  . Alcohol Use: No  . Drug Use: No  . Sexual Activity:    Partners: Male   Other Topics Concern  . Not on file   Social History Narrative    Current Outpatient Prescriptions on File Prior to Visit  Medication Sig Dispense Refill  . amoxicillin (AMOXIL) 500 MG capsule Take 2 capsules (1,000 mg total) by mouth 2 (two) times daily. 40 capsule 1  . carvedilol (COREG) 12.5 MG tablet Take 1 tablet (12.5 mg total) by mouth 2 (two) times daily with a meal. 120 tablet 5  . chlorhexidine gluconate (PERIDEX) 0.12 % solution Use as directed 15 mLs in the mouth or throat 2 (two) times daily. Swish for 30 seconds and spit twice daily after brushing 118 mL 0  . lisinopril (PRINIVIL,ZESTRIL) 10 MG tablet Take 1 tablet (10 mg total) by mouth daily. 30 tablet 0  . ibuprofen (ADVIL,MOTRIN) 800 MG tablet Take 1 tablet (800 mg total) by mouth every 6 (six) hours as needed. (Patient not taking: Reported on 06/19/2015) 30 tablet 0  . medroxyPROGESTERone (DEPO-PROVERA) 150 MG/ML injection Inject 1 mL (150 mg total) into the muscle every 3 (three) months. (Patient not taking: Reported on 06/16/2015) 150 mL 3   No  current facility-administered medications on file prior to visit.      Review of Systems  Constitutional: Negative for activity change and appetite change.  HENT: Negative for sinus pressure and sore throat.   Respiratory: Negative for chest tightness, shortness of breath and wheezing.   Cardiovascular: Negative for chest pain and palpitations.  Gastrointestinal: Negative for abdominal pain, constipation and abdominal distention.  Genitourinary: Negative.   Musculoskeletal: Negative.   Neurological: Positive for light-headedness.  Psychiatric/Behavioral: Negative for behavioral problems and dysphoric mood.       Objective: Filed Vitals:   06/19/15  1451  BP: 120/75  Pulse: 60  Temp: 98.6 F (37 C)  Resp: 15  Height: 5\' 3"  (1.6 m)  Weight: 254 lb 3.2 oz (115.304 kg)  SpO2: 97%      Physical Exam  Constitutional: She is oriented to person, place, and time. She appears well-developed and well-nourished.  Morbidly obese  Cardiovascular: Normal rate, normal heart sounds and intact distal pulses.   No murmur heard. Pulmonary/Chest: Effort normal and breath sounds normal. She has no wheezes. She has no rales. She exhibits no tenderness.  Abdominal: Soft. Bowel sounds are normal. She exhibits no distension and no mass. There is no tenderness.  Musculoskeletal: Normal range of motion.  Neurological: She is alert and oriented to person, place, and time.          Assessment & Plan:  Hypertension: Controlled. Patient has been instructed to decrease carvedilol to half of 12.5 mg twice daily in the event that she feels lightheaded and blood pressure is on the low side; she is also to notify the clinic as well. If blood pressures remain elevated she knows to remain on 12.5 mg twice daily. Continue lisinopril at 10 mg. Low-sodium, DASH diet along with lifestyle modifications.  Anxiety: Anxiety symptoms have played a large role in her concern about her blood pressure leading to checking blood pressures every 2 hours. She was scheduled to see the LCSW today for counseling but was in a hurry. She is also stressed with having to care for 6 children.  This note has been created with Education officer, environmentalDragon speech recognition software and smart phrase technology. Any transcriptional errors are unintentional.

## 2015-06-21 ENCOUNTER — Encounter (HOSPITAL_COMMUNITY): Payer: Self-pay | Admitting: Emergency Medicine

## 2015-06-21 ENCOUNTER — Emergency Department (HOSPITAL_COMMUNITY)
Admission: EM | Admit: 2015-06-21 | Discharge: 2015-06-21 | Disposition: A | Discharge status: Home / Self Care | Payer: Self-pay | Attending: Emergency Medicine | Admitting: Emergency Medicine

## 2015-06-21 ENCOUNTER — Telehealth: Payer: Self-pay | Admitting: Family Medicine

## 2015-06-21 DIAGNOSIS — J45909 Unspecified asthma, uncomplicated: Secondary | ICD-10-CM | POA: Insufficient documentation

## 2015-06-21 DIAGNOSIS — I10 Essential (primary) hypertension: Secondary | ICD-10-CM | POA: Insufficient documentation

## 2015-06-21 NOTE — Telephone Encounter (Signed)
Patient called requesting advice from nurse. Patient called stating that her last blood pressure reading was  Blood Pressure is 147/104.  Patient was seen by Dr. Venetia NightAmao 3/13 and she was instructed by the doctor to take half of the Carvedilol. Patient would like advice on how to control her blood pressure. Please follow up.

## 2015-06-21 NOTE — Telephone Encounter (Signed)
Spoke to patient and advised her to take her whole Coreg tablet instead of the half a pill.  Patient agreed.  RN reiterated that we would see her at her next office visit and that she should check her blood pressure 2 times a day instead of 5 times a day.

## 2015-06-21 NOTE — ED Notes (Signed)
Pt reports recent change in bp medication d/t hypotension and palpitations. Tonight her bp has been elevated for a few hours. Pt only complaint is dry mouth.

## 2015-06-25 ENCOUNTER — Emergency Department (HOSPITAL_COMMUNITY)
Admission: EM | Admit: 2015-06-25 | Discharge: 2015-06-25 | Disposition: A | Discharge status: Home / Self Care | Payer: Self-pay | Attending: Emergency Medicine | Admitting: Emergency Medicine

## 2015-06-25 ENCOUNTER — Encounter (HOSPITAL_COMMUNITY): Payer: Self-pay | Admitting: *Deleted

## 2015-06-25 DIAGNOSIS — Z8619 Personal history of other infectious and parasitic diseases: Secondary | ICD-10-CM | POA: Insufficient documentation

## 2015-06-25 DIAGNOSIS — J069 Acute upper respiratory infection, unspecified: Secondary | ICD-10-CM | POA: Insufficient documentation

## 2015-06-25 DIAGNOSIS — Z79899 Other long term (current) drug therapy: Secondary | ICD-10-CM | POA: Insufficient documentation

## 2015-06-25 DIAGNOSIS — Z87891 Personal history of nicotine dependence: Secondary | ICD-10-CM | POA: Insufficient documentation

## 2015-06-25 DIAGNOSIS — T50905D Adverse effect of unspecified drugs, medicaments and biological substances, subsequent encounter: Secondary | ICD-10-CM

## 2015-06-25 DIAGNOSIS — Z8632 Personal history of gestational diabetes: Secondary | ICD-10-CM | POA: Insufficient documentation

## 2015-06-25 DIAGNOSIS — I10 Essential (primary) hypertension: Secondary | ICD-10-CM | POA: Insufficient documentation

## 2015-06-25 DIAGNOSIS — Z8659 Personal history of other mental and behavioral disorders: Secondary | ICD-10-CM | POA: Insufficient documentation

## 2015-06-25 DIAGNOSIS — R634 Abnormal weight loss: Secondary | ICD-10-CM | POA: Insufficient documentation

## 2015-06-25 DIAGNOSIS — Z793 Long term (current) use of hormonal contraceptives: Secondary | ICD-10-CM | POA: Insufficient documentation

## 2015-06-25 DIAGNOSIS — R42 Dizziness and giddiness: Secondary | ICD-10-CM | POA: Insufficient documentation

## 2015-06-25 DIAGNOSIS — Z792 Long term (current) use of antibiotics: Secondary | ICD-10-CM | POA: Insufficient documentation

## 2015-06-25 DIAGNOSIS — J45909 Unspecified asthma, uncomplicated: Secondary | ICD-10-CM | POA: Insufficient documentation

## 2015-06-25 DIAGNOSIS — T448X5A Adverse effect of centrally-acting and adrenergic-neuron-blocking agents, initial encounter: Secondary | ICD-10-CM | POA: Insufficient documentation

## 2015-06-25 DIAGNOSIS — T502X5A Adverse effect of carbonic-anhydrase inhibitors, benzothiadiazides and other diuretics, initial encounter: Secondary | ICD-10-CM | POA: Insufficient documentation

## 2015-06-25 NOTE — ED Notes (Signed)
The pt has a cold cough all 6 of her childrfen have been ill chest and sinus congestion and her bp is high.  It goes up and down lmp dec on injection.  She has a baby 644 months old. Unknown temp  ibu one hour ago

## 2015-06-25 NOTE — ED Notes (Signed)
Pt c/o URI symptoms.  States all of her children have been sick recently, and "we just can't stop getting sick".  Pt is also concerned about her BP and her medications

## 2015-06-25 NOTE — ED Provider Notes (Signed)
CSN: [id number]     Arrival date & time 06/25/15  0031 History  By signing my name below, I, Crystal Burns, attest that this documentation has been prepared under the direction and in the presence of Derwood Kaplan, MD. Electronically Signed: Doreatha Burns, ED Scribe. 06/25/2015. 2:40 AM.     Chief Complaint  Patient presents with  . multiple complaints    The history is provided by the patient. No language interpreter was used.   HPI Comments: Crystal Burns is a 35 y.o. female with h/o HTN who presents to the Emergency Department complaining of moderate nasal congestion onset 2 days ago with associated ear fullness, rhinorrhea, sore throat, occasionally productive cough with clear/while phlegm, substernal chest tenderness d/t cough. Per pt, her CP is unaffected by breathing. Per pt, she has been drinking tea with some relief of sore throat. Pt states sick contact with her children who have similar symptoms. She denies fever, myalgias.   She also complains that her BP has been elevated recently with recurrent episodes of lightheadedness. She currently takes 2 12.5 mg carvedilol (morning and night) and 1 10 mg lisinopril (noon) per day for BP. She has previously taken labetalol and HCTZ. Pt notes that she feels the most lightheaded in the morning when she wakes up. She records her BP multiple times per day. She notes that she has also lost 50 lbs and improved her diet in an effort to improve her BP. Pt is [facility] by [facility] and sees her PCP every 3 weeks for ongoing evaluation and management of her BP.   Past Medical History  Diagnosis Date  . History of gonorrhea   . Gestational diabetes     2012  . Hypertension     2004  . Asthma     2010-only bothers pt during bad cold  . Depression     2010-had when mother passed away  . Pregnancy induced hypertension    Past Surgical History  Procedure Laterality Date  . Dilation and curettage of uterus    .  Cholecystectomy    . Wisdom tooth extraction     Family History  Problem Relation Age of Onset  . Hypertension Mother   . Diabetes Mother   . Cancer Mother   . Hypertension Father   . Diabetes Father   . Hyperlipidemia Father   . Arthritis    . Asthma    . Breast cancer    . Heart failure    . Congenital heart disease    . Depression    . Heart attack     Social History  Substance Use Topics  . Smoking status: Former Smoker    Types: Cigarettes    Quit date: 10/31/2010  . Smokeless tobacco: Never Used  . Alcohol Use: No   OB History    Gravida Para Term Preterm AB TAB SAB Ectopic Multiple Living   0 4 0 4 0 0 6     Review of Systems  A complete 10 system review of systems was obtained and all systems are negative except as noted in the HPI and PMH.    Allergies  Shellfish allergy and Bee venom  Home Medications   Prior to Admission medications   Medication Sig Start Date End Date Taking? Authorizing Provider  amoxicillin (AMOXIL) 500 MG capsule Take 2 capsules (1,000 mg total) by mouth 2 (two) times daily. 06/16/15  Yes Charm Rings, MD  carvedilol (COREG)  12.5 MG tablet Take 1 tablet (12.5 mg total) by mouth 2 (two) times daily with a meal. 06/16/15  Yes Charm Rings, MD  chlorhexidine gluconate (PERIDEX) 0.12 % solution Use as directed 15 mLs in the mouth or throat 2 (two) times daily. Swish for 30 seconds and spit twice daily after brushing 06/12/15  Yes Josalyn Funches, MD  ibuprofen (ADVIL,MOTRIN) 800 MG tablet Take 1 tablet (800 mg total) by mouth every 6 (six) hours as needed. Patient taking differently: Take 600 mg by mouth every 6 (six) hours as needed.  05/06/15  Yes Domenick Gong, MD  lisinopril (PRINIVIL,ZESTRIL) 10 MG tablet Take 1 tablet (10 mg total) by mouth daily. 06/16/15  Yes Charm Rings, MD  medroxyPROGESTERone (DEPO-PROVERA) 150 MG/ML injection Inject 1 mL (150 mg total) into the muscle every 3 (three) months. 02/27/15  Yes Rachelle A Denney,  CNM   BP 133/81 mmHg  Pulse 56  Temp(Src) 98.8 F (37.1 C) (Oral)  Resp 19  SpO2 100% Physical Exam  Constitutional: She is oriented to person, place, and time. She appears well-developed and well-nourished.  HENT:  Head: Normocephalic and atraumatic.  Eyes: Conjunctivae and EOM are normal. Pupils are equal, round, and reactive to light.  Neck: Normal range of motion. Neck supple.  Cardiovascular: Normal rate, regular rhythm and normal heart sounds.  Exam reveals no gallop and no friction rub.   No murmur heard. RRR  Pulmonary/Chest: Effort normal and breath sounds normal. No respiratory distress. She has no wheezes. She has no rales.  Lungs CTA bilaterally.   Abdominal: She exhibits no distension.  Musculoskeletal: Normal range of motion.  Neurological: She is alert and oriented to person, place, and time.  Skin: Skin is warm and dry.  Psychiatric: She has a normal mood and affect. Her behavior is normal.  Nursing note and vitals reviewed.   ED Course  Procedures (including critical care time) DIAGNOSTIC STUDIES: Oxygen Saturation is 100% on RA, normal by my interpretation.    COORDINATION OF CARE: 2:38 AM Discussed treatment plan with pt at bedside which includes PCP follow up and pt agreed to plan. Pt was strongly encouraged to record her BP and pulse regularly, as well as request a new doctor from the practice d/t her expressed dissatisfaction. She was also advised to go to her PCP appointments prepared with questions regarding her BP, pulse, overall health and well being.    EKG Interpretation  Date/Time:  Sunday June 25 2015 03:01:50 EDT Ventricular Rate:  59 PR Interval:  180 QRS Duration: 96 QT Interval:  384 QTC Calculation: 380 R Axis:   -8 Text Interpretation:  Sinus rhythm Low voltage, precordial leads No acute changes Confirmed by Rhunette Croft, MD, Janey Genta [phone]) on 06/25/2015 3:14:06 AM         MDM   Final diagnoses:  Viral URI  Episode of dizziness   Medication side effects, subsequent encounter    I personally performed the services described in this documentation, which was scribed in my presence. The recorded information has been reviewed and is accurate.  Pt comes in with cc of uri like symptoms. Exam is unremarkable - no CXr needed.  She also c/o BP management. Her BP has been hard to control and seems like since the meds were started she has been having episodes of lightheadedness. Pt has no chest pain, dib. She is currently on coreg and lisniopril - and there have been few adjustments done. She has no signs of DVT, and is  PERC neg-  i dont think the dizziness is due to that. She denies any acute blood loss. Advised closet pcp f/u and working with them to get optimal med adjustment. I informed her that with me asking her to stop meds or starting her on new meds will only make things complicated.   Derwood KaplanAnkit Catarino Vold, MD 06/25/15 [phone]

## 2015-06-25 NOTE — ED Notes (Signed)
MD at bedside. 

## 2015-06-25 NOTE — Discharge Instructions (Signed)
Dizziness °Dizziness is a common problem. It is a feeling of unsteadiness or light-headedness. You may feel like you are about to faint. Dizziness can lead to injury if you stumble or fall. Anyone can become dizzy, but dizziness is more common in older adults. This condition can be caused by a number of things, including medicines, dehydration, or illness. °HOME CARE INSTRUCTIONS °Taking these steps may help with your condition: °Eating and Drinking °· Drink enough fluid to keep your urine clear or pale yellow. This helps to keep you from becoming dehydrated. Try to drink more clear fluids, such as water. °· Do not drink alcohol. °· Limit your caffeine intake if directed by your health care provider. °· Limit your salt intake if directed by your health care provider. °Activity °· Avoid making quick movements. °· Rise slowly from chairs and steady yourself until you feel okay. °· In the morning, first sit up on the side of the bed. When you feel okay, stand slowly while you hold onto something until you know that your balance is fine. °· Move your legs often if you need to stand in one place for a long time. Tighten and relax your muscles in your legs while you are standing. °· Do not drive or operate heavy machinery if you feel dizzy. °· Avoid bending down if you feel dizzy. Place items in your home so that they are easy for you to reach without leaning over. °Lifestyle °· Do not use any tobacco products, including cigarettes, chewing tobacco, or electronic cigarettes. If you need help quitting, ask your health care provider. °· Try to reduce your stress level, such as with yoga or meditation. Talk with your health care provider if you need help. °General Instructions °· Watch your dizziness for any changes. °· Take medicines only as directed by your health care provider. Talk with your health care provider if you think that your dizziness is caused by a medicine that you are taking. °· Tell a friend or a family  member that you are feeling dizzy. If he or she notices any changes in your behavior, have this person call your health care provider. °· Keep all follow-up visits as directed by your health care provider. This is important. °SEEK MEDICAL CARE IF: °· Your dizziness does not go away. °· Your dizziness or light-headedness gets worse. °· You feel nauseous. °· You have reduced hearing. °· You have new symptoms. °· You are unsteady on your feet or you feel like the room is spinning. °SEEK IMMEDIATE MEDICAL CARE IF: °· You vomit or have diarrhea and are unable to eat or drink anything. °· You have problems talking, walking, swallowing, or using your arms, hands, or legs. °· You feel generally weak. °· You are not thinking clearly or you have trouble forming sentences. It may take a friend or family member to notice this. °· You have chest pain, abdominal pain, shortness of breath, or sweating. °· Your vision changes. °· You notice any bleeding. °· You have a headache. °· You have neck pain or a stiff neck. °· You have a fever. °  °This information is not intended to replace advice given to you by your health care provider. Make sure you discuss any questions you have with your health care provider. °  °Document Released: 09/18/2000 Document Revised: 08/09/2014 Document Reviewed: 03/21/2014 °Elsevier Interactive Patient Education ©2016 [employer] ° °Near-Syncope °Near-syncope (commonly known as near fainting) is sudden weakness, dizziness, or feeling like you might pass out.   This can happen when getting up or while standing for a long time. It is caused by a sudden decrease in blood flow to the brain, which can occur for various reasons. Most of the reasons are not serious.  °HOME CARE °Watch your condition for any changes. °· Have someone stay with you until you feel stable. °· If you feel like you are going to pass out: °¨ Lie down right away. °¨ Prop your feet up if you can. °¨ Breathe deeply and steadily. °¨ Move  only when the feeling has gone away. Most of the time, this feeling lasts only a few minutes. You may feel tired for several hours. °· Drink enough fluids to keep your pee (urine) clear or pale yellow. °· If you are taking blood pressure or heart medicine, stand up slowly. °· Follow up with your doctor as told. °GET HELP RIGHT AWAY IF:  °· You have a severe headache. °· You have unusual pain in the chest, belly (abdomen), or back. °· You have bleeding from the mouth or butt (rectum), or you have black or tarry poop (stool). °· You feel your heart beat differently than normal, or you have a very fast pulse. °· You pass out, or you twitch and shake when you pass out. °· You pass out when sitting or lying down. °· You feel confused. °· You have trouble walking. °· You are weak. °· You have vision problems. °MAKE SURE YOU:  °· Understand these instructions. °· Will watch your condition. °· Will get help right away if you are not doing well or get worse. °  °This information is not intended to replace advice given to you by your health care provider. Make sure you discuss any questions you have with your health care provider. °  °Document Released: 09/11/2007 Document Revised: 04/15/2014 Document Reviewed: 08/28/2012 °Elsevier Interactive Patient Education ©2016 [employer] ° °

## 2015-06-28 ENCOUNTER — Encounter (HOSPITAL_COMMUNITY): Payer: Self-pay | Admitting: *Deleted

## 2015-06-28 DIAGNOSIS — Z79899 Other long term (current) drug therapy: Secondary | ICD-10-CM | POA: Insufficient documentation

## 2015-06-28 DIAGNOSIS — R42 Dizziness and giddiness: Secondary | ICD-10-CM | POA: Insufficient documentation

## 2015-06-28 DIAGNOSIS — Z8659 Personal history of other mental and behavioral disorders: Secondary | ICD-10-CM | POA: Insufficient documentation

## 2015-06-28 DIAGNOSIS — Z87891 Personal history of nicotine dependence: Secondary | ICD-10-CM

## 2015-06-28 DIAGNOSIS — Z8632 Personal history of gestational diabetes: Secondary | ICD-10-CM | POA: Insufficient documentation

## 2015-06-28 DIAGNOSIS — I159 Secondary hypertension, unspecified: Secondary | ICD-10-CM

## 2015-06-28 DIAGNOSIS — Z8619 Personal history of other infectious and parasitic diseases: Secondary | ICD-10-CM

## 2015-06-28 DIAGNOSIS — Z792 Long term (current) use of antibiotics: Secondary | ICD-10-CM | POA: Insufficient documentation

## 2015-06-28 DIAGNOSIS — J45909 Unspecified asthma, uncomplicated: Secondary | ICD-10-CM | POA: Insufficient documentation

## 2015-06-28 NOTE — ED Notes (Signed)
Pt c/o HTN. States she took her BP meds as prescribed.

## 2015-06-29 ENCOUNTER — Telehealth: Payer: Self-pay | Admitting: Family Medicine

## 2015-06-29 ENCOUNTER — Encounter (HOSPITAL_COMMUNITY): Payer: Self-pay | Admitting: Emergency Medicine

## 2015-06-29 ENCOUNTER — Emergency Department (HOSPITAL_COMMUNITY)
Admission: EM | Admit: 2015-06-29 | Discharge: 2015-06-29 | Disposition: A | Discharge status: Home / Self Care | Payer: Self-pay | Source: Home / Self Care | Attending: Emergency Medicine | Admitting: Emergency Medicine

## 2015-06-29 DIAGNOSIS — I159 Secondary hypertension, unspecified: Secondary | ICD-10-CM

## 2015-06-29 DIAGNOSIS — Z79899 Other long term (current) drug therapy: Secondary | ICD-10-CM | POA: Insufficient documentation

## 2015-06-29 DIAGNOSIS — Z8632 Personal history of gestational diabetes: Secondary | ICD-10-CM | POA: Insufficient documentation

## 2015-06-29 DIAGNOSIS — Z87891 Personal history of nicotine dependence: Secondary | ICD-10-CM | POA: Insufficient documentation

## 2015-06-29 DIAGNOSIS — R42 Dizziness and giddiness: Secondary | ICD-10-CM

## 2015-06-29 DIAGNOSIS — J45909 Unspecified asthma, uncomplicated: Secondary | ICD-10-CM | POA: Insufficient documentation

## 2015-06-29 DIAGNOSIS — I10 Essential (primary) hypertension: Secondary | ICD-10-CM | POA: Insufficient documentation

## 2015-06-29 LAB — CBC WITH DIFFERENTIAL/PLATELET
Basophils Absolute: 0 10*3/uL (ref 0.0–0.1)
Basophils Relative: 0 %
EOS PCT: 2 %
Eosinophils Absolute: 0.2 10*3/uL (ref 0.0–0.7)
HCT: 38 % (ref 36.0–46.0)
Hemoglobin: 12 g/dL (ref 12.0–15.0)
LYMPHS ABS: 2.9 10*3/uL (ref 0.7–4.0)
LYMPHS PCT: 37 %
MCH: 25.6 pg — AB (ref 26.0–34.0)
MCHC: 31.6 g/dL (ref 30.0–36.0)
MCV: 81.2 fL (ref 78.0–100.0)
MONO ABS: 0.3 10*3/uL (ref 0.1–1.0)
MONOS PCT: 4 %
NEUTROS PCT: 57 %
Neutro Abs: 4.4 10*3/uL (ref 1.7–7.7)
PLATELETS: 353 10*3/uL (ref 150–400)
RBC: 4.68 MIL/uL (ref 3.87–5.11)
RDW: 13.9 % (ref 11.5–15.5)
WBC: 7.8 10*3/uL (ref 4.0–10.5)

## 2015-06-29 LAB — BASIC METABOLIC PANEL
Anion gap: 9 (ref 5–15)
BUN: 7 mg/dL (ref 6–20)
CO2: 22 mmol/L (ref 22–32)
Calcium: 10.2 mg/dL (ref 8.9–10.3)
Chloride: 105 mmol/L (ref 101–111)
Creatinine, Ser: 0.58 mg/dL (ref 0.44–1.00)
GFR calc Af Amer: >60 mL/min (ref >60)
GLUCOSE: 94 mg/dL (ref 65–99)
POTASSIUM: 4 mmol/L (ref 3.5–5.1)
Sodium: 136 mmol/L (ref 135–145)

## 2015-06-29 NOTE — Telephone Encounter (Signed)
Pt. Called stating that her BP is high and she is not trying to have a stroke. Pt. Stated she needs to speak with the nurse ASAP. Please f/u

## 2015-06-29 NOTE — Telephone Encounter (Signed)
Patient verified DOB Patient complains of inconsistent BP readings. Patient states her pressure ranges from 113/76- 201/105 Patient states she takes her medication everyday. Patient takes carvedilol at 9am and 9pm Patient takes lisinopril at 12pm. Patient complains of low HR and dizziness. Patient complains of feeling "bad" while being on carvedilol. MA left VM per patient. VM states to take half the dosage of carvedilol at 9am and 9pm. Patient is to come into the office tomorrow to check her HR.

## 2015-06-29 NOTE — ED Notes (Addendum)
Pt has requested to have her bp checked twice. Both rechecks have been lower than previous with most recent being 127/87 and pt now states she wants to leave with the dropping of pressure. Pt states she decided to stay after personally checking her pressure from her home monitor and showing an elevated pressure.

## 2015-06-29 NOTE — ED Provider Notes (Signed)
CSN: [id number]     Arrival date & time 06/28/15  2351 History  By signing my name below, I, Doreatha MartinEva Mathews, attest that this documentation has been prepared under the direction and in the presence of Marily MemosJason Andrae Claunch, MD. Electronically Signed: Doreatha MartinEva Mathews, ED Scribe. 06/29/2015. 4:06 AM.    Chief Complaint  Patient presents with  . Hypertension   The history is provided by the patient. No language interpreter was used.   HPI Comments: Crystal Burns is a 35 y.o. female who presents to the Emergency Department complaining of recurrent, ongoing lightheadedness and elevated BP for months. Pt states she has been compliant with all her medications and dosages. She currently takes bid 12.5 mg carvedilol and qd 10 mg lisinopril. She notes that 3 weeks ago, her dosages of both medications were decreased from 20 mg lisinopril and 50 mg carvedilol, but she reports no relief of her lightheadedness. Pt has been seen in the ED multiple times for the same complaints. She is [facility]. She denies syncope, HA, visual disturbance.   Past Medical History  Diagnosis Date  . History of gonorrhea   . Gestational diabetes     2012  . Hypertension     2004  . Asthma     2010-only bothers pt during bad cold  . Depression     2010-had when mother passed away  . Pregnancy induced hypertension    Past Surgical History  Procedure Laterality Date  . Dilation and curettage of uterus    . Cholecystectomy    . Wisdom tooth extraction     Family History  Problem Relation Age of Onset  . Hypertension Mother   . Diabetes Mother   . Cancer Mother   . Hypertension Father   . Diabetes Father   . Hyperlipidemia Father   . Arthritis    . Asthma    . Breast cancer    . Heart failure    . Congenital heart disease    . Depression    . Heart attack     Social History  Substance Use Topics  . Smoking status: Former Smoker    Types: Cigarettes    Quit date: 10/31/2010  . Smokeless  tobacco: Never Used  . Alcohol Use: No   OB History    Gravida Para Term Preterm AB TAB SAB Ectopic Multiple Living   11 7 7  0 4 0 4 0 0 6     Review of Systems  Eyes: Negative for visual disturbance.  Neurological: Positive for light-headedness. Negative for syncope and headaches.  All other systems reviewed and are negative.  Allergies  Shellfish allergy and Bee venom  Home Medications   Prior to Admission medications   Medication Sig Start Date End Date Taking? Authorizing Provider  amoxicillin (AMOXIL) 500 MG capsule Take 2 capsules (1,000 mg total) by mouth 2 (two) times daily. 06/16/15  Yes Charm RingsErin J Honig, MD  carvedilol (COREG) 12.5 MG tablet Take 1 tablet (12.5 mg total) by mouth 2 (two) times daily with a meal. 06/16/15  Yes Charm RingsErin J Honig, MD  ibuprofen (ADVIL,MOTRIN) 600 MG tablet Take 600 mg by mouth every 6 (six) hours as needed for moderate pain.  05/15/15  Yes Historical Provider, MD  lisinopril (PRINIVIL,ZESTRIL) 10 MG tablet Take 1 tablet (10 mg total) by mouth daily. 06/16/15  Yes Charm RingsErin J Honig, MD  chlorhexidine gluconate (PERIDEX) 0.12 % solution Use as directed 15 mLs in the mouth or throat  2 (two) times daily. Swish for 30 seconds and spit twice daily after brushing Patient not taking: Reported on 06/29/2015 06/12/15   Dessa Phi, MD  ibuprofen (ADVIL,MOTRIN) 800 MG tablet Take 1 tablet (800 mg total) by mouth every 6 (six) hours as needed. Patient not taking: Reported on 06/29/2015 05/06/15   Domenick Gong, MD  medroxyPROGESTERone (DEPO-PROVERA) 150 MG/ML injection Inject 1 mL (150 mg total) into the muscle every 3 (three) months. Patient not taking: Reported on 06/29/2015 02/27/15   Rachelle A Denney, CNM   BP 123/83 mmHg  Pulse 57  Temp(Src) 98.1 F (36.7 C) (Oral)  Resp 18  SpO2 100% Physical Exam  Constitutional: She is oriented to person, place, and time. She appears well-developed and well-nourished.  HENT:  Head: Normocephalic and atraumatic.  Eyes:  Conjunctivae and EOM are normal. Pupils are equal, round, and reactive to light.  Neck: Normal range of motion. Neck supple.  Cardiovascular: Normal rate, regular rhythm and normal heart sounds.  Exam reveals no gallop and no friction rub.   No murmur heard. Pulmonary/Chest: Effort normal and breath sounds normal. No respiratory distress. She has no wheezes. She has no rales.  Abdominal: Soft. Bowel sounds are normal. She exhibits no distension and no mass. There is no tenderness. There is no rebound and no guarding.  Musculoskeletal: Normal range of motion.  Neurological: She is alert and oriented to person, place, and time. No cranial nerve deficit.  Cranial nerves 2-12 intact. Normal finger to nose testing.    Skin: Skin is warm and dry.  Psychiatric: She has a normal mood and affect. Her behavior is normal.  Nursing note and vitals reviewed.   ED Course  Procedures (including critical care time) DIAGNOSTIC STUDIES: Oxygen Saturation is 100% on RA, normal by my interpretation.    COORDINATION OF CARE: 4:02 AM Discussed treatment plan with pt at bedside which includes PCP follow up and pt agreed to plan. Pt was again advised that ED physicians do not adjust home BP medications.     MDM   Final diagnoses:  Secondary hypertension, unspecified  Light headedness    Intermittent hypertension sometimes with light headed ness, sometimes with headache. Nothing consistent. Here BP's normal for >3 hours, asymptomatic. Has been going through multiple BP issues with PCP, can continue with same. No BP med changes for now.   New Prescriptions: Discharge Medication List as of 06/29/2015  4:17 AM       I have personally and contemperaneously reviewed labs and imaging and used in my decision making as above.   A medical screening exam was performed and I feel the patient has had an appropriate workup for their chief complaint at this time and likelihood of emergent condition existing is low.  Their vital signs are stable. They have been counseled on decision, discharge, follow up and which symptoms necessitate immediate return to the emergency department.  They verbally stated understanding and agreement with plan and discharged in stable condition.    I personally performed the services described in this documentation, which was scribed in my presence. The recorded information has been reviewed and is accurate.    Marily Memos, MD 06/29/15 [phone]

## 2015-06-29 NOTE — ED Notes (Signed)
Pt. reports elevated blood pressure at home this evening  201/135 , denies pain or discomfort / no nausea or blurred vision , seen here last night for the same complaint discharged home with no prescription .

## 2015-06-29 NOTE — ED Notes (Signed)
Pt stable, ambulatory, states understanding of discharge instructions 

## 2015-06-29 NOTE — Telephone Encounter (Signed)
Patient called stating that her medication is not helping with her blood pressure. Patient taking Carvedilol. Patient stated that the carvedilol makes her pulse drop. Please follow up.

## 2015-06-30 ENCOUNTER — Emergency Department (HOSPITAL_COMMUNITY)
Admission: EM | Admit: 2015-06-30 | Discharge: 2015-06-30 | Disposition: A | Discharge status: Home / Self Care | Payer: Self-pay | Attending: Emergency Medicine | Admitting: Emergency Medicine

## 2015-06-30 DIAGNOSIS — I10 Essential (primary) hypertension: Secondary | ICD-10-CM

## 2015-06-30 NOTE — ED Provider Notes (Signed)
CSN: [id number]     Arrival date & time 06/29/15  2059 History  By signing my name below, I, Ronney LionSuzanne Le, attest that this documentation has been prepared under the direction and in the presence of Alvira MondayErin Valerya Maxton, MD. Electronically Signed: Ronney LionSuzanne Le, ED Scribe. 06/30/2015. 4:05 AM.  Chief Complaint  Patient presents with  . Hypertension    Patient is a 35 y.o. female presenting with hypertension. The history is provided by the patient. No language interpreter was used.  Hypertension This is a recurrent problem. The current episode started 6 to 12 hours ago. The problem occurs constantly. The problem has been rapidly improving (after taking hypertension medications). Associated symptoms include headaches (intermittent, not current). Pertinent negatives include no chest pain, no abdominal pain and no shortness of breath. Nothing aggravates the symptoms. The symptoms are relieved by medications. Treatments tried: medications. The treatment provided significant relief.  HPI Comments: Crystal Burns is a 35 y.o. female with a history of hypertension, pregnancy-induced hypertension, asthma, and depression, who presents to the Emergency Department complaining of an elevated blood pressure reading of 201/135 that Crystal Burns had measured at home this evening, about 6 hours ago, right before Crystal Burns was scheduled to take her daily hypertension medications. Crystal Burns is currently on carvedilol 12.5 mg BID and lisinopril 10 mg qd; Crystal Burns states Crystal Burns had taken her medications before coming to the ED. Patient also notes chronic intermittent episodes of light-headedness, intermittent, sharp headaches, and intermittent episodes of a "pins-and-needle" sensation through her left-sided body, lasting for several seconds at a time. Crystal Burns states the sensation is alleviated by moving her extremities. Crystal Burns denies any headache presently. Patient has been seen at the ED multiple times for the same issue. Crystal Burns denies SOB, numbness, weakness, visual  changes, vomiting, nausea, speech difficulty, or facial asymmetry.    Past Medical History  Diagnosis Date  . History of gonorrhea   . Gestational diabetes     2012  . Hypertension     2004  . Asthma     2010-only bothers pt during bad cold  . Depression     2010-had when mother passed away  . Pregnancy induced hypertension    Past Surgical History  Procedure Laterality Date  . Dilation and curettage of uterus    . Cholecystectomy    . Wisdom tooth extraction     Family History  Problem Relation Age of Onset  . Hypertension Mother   . Diabetes Mother   . Cancer Mother   . Hypertension Father   . Diabetes Father   . Hyperlipidemia Father   . Arthritis    . Asthma    . Breast cancer    . Heart failure    . Congenital heart disease    . Depression    . Heart attack     Social History  Substance Use Topics  . Smoking status: Former Smoker    Types: Cigarettes    Quit date: 10/31/2010  . Smokeless tobacco: Never Used  . Alcohol Use: No   OB History    Gravida Para Term Preterm AB TAB SAB Ectopic Multiple Living   11 7 7  0 4 0 4 0 0 6     Review of Systems  Constitutional: Negative for fever.  HENT: Negative for sore throat.   Eyes: Negative for visual disturbance.  Respiratory: Negative for cough and shortness of breath.   Cardiovascular: Negative for chest pain.  Gastrointestinal: Negative for nausea, vomiting and abdominal pain.  Endocrine: Negative  for polydipsia.  Genitourinary: Negative for difficulty urinating.  Musculoskeletal: Negative for back pain and neck pain.  Skin: Negative for rash.  Neurological: Positive for light-headedness and headaches (intermittent, not current). Negative for syncope, facial asymmetry, speech difficulty, weakness and numbness.       No current numbness/tingling  All other systems reviewed and are negative.     Allergies  Shellfish allergy and Bee venom  Home Medications   Prior to Admission medications    Medication Sig Start Date End Date Taking? Authorizing Provider  amoxicillin (AMOXIL) 500 MG capsule Take 2 capsules (1,000 mg total) by mouth 2 (two) times daily. 06/16/15  Yes Charm Rings, MD  carvedilol (COREG) 12.5 MG tablet Take 1 tablet (12.5 mg total) by mouth 2 (two) times daily with a meal. 06/16/15  Yes Charm Rings, MD  ibuprofen (ADVIL,MOTRIN) 600 MG tablet Take 600 mg by mouth every 6 (six) hours as needed for moderate pain.  05/15/15  Yes Historical Provider, MD  lisinopril (PRINIVIL,ZESTRIL) 10 MG tablet Take 1 tablet (10 mg total) by mouth daily. 06/16/15  Yes Charm Rings, MD  chlorhexidine gluconate (PERIDEX) 0.12 % solution Use as directed 15 mLs in the mouth or throat 2 (two) times daily. Swish for 30 seconds and spit twice daily after brushing Patient not taking: Reported on 06/29/2015 06/12/15   Dessa Phi, MD  ibuprofen (ADVIL,MOTRIN) 800 MG tablet Take 1 tablet (800 mg total) by mouth every 6 (six) hours as needed. Patient not taking: Reported on 06/29/2015 05/06/15   Domenick Gong, MD  medroxyPROGESTERone (DEPO-PROVERA) 150 MG/ML injection Inject 1 mL (150 mg total) into the muscle every 3 (three) months. Patient not taking: Reported on 06/29/2015 02/27/15   Rachelle A Denney, CNM   BP 119/76 mmHg  Pulse 67  Temp(Src) 98.6 F (37 C) (Oral)  Resp 18  Ht  (1.6 m)  Wt 251 lb (113.853 kg)  BMI 44.47 kg/m2  SpO2 100% Physical Exam  Constitutional: Crystal Burns is oriented to person, place, and time. Crystal Burns appears well-developed and well-nourished. No distress.  HENT:  Head: Normocephalic and atraumatic.  Eyes: Conjunctivae and EOM are normal.  Neck: Normal range of motion.  Cardiovascular: Normal rate, regular rhythm, normal heart sounds and intact distal pulses.  Exam reveals no gallop and no friction rub.   No murmur heard. Pulmonary/Chest: Effort normal and breath sounds normal. No respiratory distress. Crystal Burns has no wheezes. Crystal Burns has no rales.  Abdominal: Soft. Crystal Burns  exhibits no distension. There is no tenderness. There is no guarding.  Musculoskeletal: Crystal Burns exhibits no edema or tenderness.  Neurological: Crystal Burns is alert and oriented to person, place, and time. Crystal Burns has normal strength. No cranial nerve deficit or sensory deficit. Coordination normal. GCS eye subscore is 4. GCS verbal subscore is 5. GCS motor subscore is 6.  Skin: Skin is warm and dry. No rash noted. Crystal Burns is not diaphoretic. No erythema.  Nursing note and vitals reviewed.   ED Course  Procedures (including critical care time)  DIAGNOSTIC STUDIES: Oxygen Saturation is 100% on RA, normal by my interpretation.    COORDINATION OF CARE: 3:54 AM - Doubt any emergent condition at this time. Discussed treatment plan with pt at bedside which includes follow-up with PCP to discuss hypertension medication management. Pt verbalized understanding and agreed to plan.   Labs Review Labs Reviewed  CBC WITH DIFFERENTIAL/PLATELET - Abnormal; Notable for the following:    MCH 25.6 (*)    All other components within normal  limits  BASIC METABOLIC PANEL    Imaging Review No results found. I have personally reviewed and evaluated these images and lab results as part of my medical decision-making.   EKG Interpretation None      MDM   Final diagnoses:  Essential hypertension   34yo female with history of hypertension presents with concern of hypertension.  When blood pressure was checked today, it was found to be 201/135 at home and patient presented to the ED. On arrival to the ED, BP was normal, 120s systolic.  Patient without current headache or neurologic symptoms, no chest pain, no shortness of breath and have low suspicion for hypertensive emergencies including low suspicion for [facility], hypertensive encephalopathy, stroke, MI, aortic dissection, pulmonary edema.  Crystal Burns reports episodes which have been occuring for months lasting seconds with tingling of arms that improves after moving extremities, and  given current normal neurologic exam as well as atypical nature of symptoms, feel this is unlikely to represent CVA or stroke. Reports intermittent headaches, however not consistent with SAH.  BMP was checked showing normal renal function.  Blood pressures are normal in the ED.  Discussed importance of close primary care follow up and reasons to return to the ED in detail. Patient has been seen in the ED several days this week for concern for hypertension, and was noted to have normal BP.  Discussed that patient should follow up with PCP regarding intermittent hypertension at home, and return to ED if Crystal Burns develops symptoms of a blood pressure emergency. Patient discharged in stable condition with understanding of reasons to return.    I personally performed the services described in this documentation, which was scribed in my presence. The recorded information has been reviewed and is accurate.      Alvira Monday, MD 06/30/15 2014

## 2015-06-30 NOTE — ED Notes (Signed)
Pt left with all her belongings and ambulated out of the treatment area.  

## 2015-07-01 ENCOUNTER — Emergency Department (INDEPENDENT_AMBULATORY_CARE_PROVIDER_SITE_OTHER)
Admission: EM | Admit: 2015-07-01 | Discharge: 2015-07-01 | Disposition: A | Discharge status: Home / Self Care | Payer: Self-pay | Source: Home / Self Care | Attending: Family Medicine | Admitting: Family Medicine

## 2015-07-01 ENCOUNTER — Encounter (HOSPITAL_COMMUNITY): Payer: Self-pay | Admitting: Emergency Medicine

## 2015-07-01 DIAGNOSIS — I10 Essential (primary) hypertension: Secondary | ICD-10-CM

## 2015-07-01 NOTE — Discharge Instructions (Signed)
Your blood pressure readings have been high, but these episodes do not appear dangerous at this time.  No one wants you to have a stroke, but if you mediation needs to be adjusted it is best that your PCP does that.   It may take several trials of medication in order to find the right combination that will smooth the spikes in your blood pressure.  If labetalol was working for you, you should speak to your doctor about this.   If you still are not happy with your blood pressure you should go to the ER.

## 2015-07-01 NOTE — ED Notes (Signed)
[facility] with a complaint of hypertension that starts in the later part of the evening. The patient stated that she was evaluated at the ED on 06/29/15 and 06/30/2015 for the same.

## 2015-07-03 ENCOUNTER — Ambulatory Visit (HOSPITAL_BASED_OUTPATIENT_CLINIC_OR_DEPARTMENT_OTHER): Payer: Self-pay | Admitting: Clinical

## 2015-07-03 ENCOUNTER — Ambulatory Visit: Payer: Self-pay | Attending: Internal Medicine | Admitting: Internal Medicine

## 2015-07-03 ENCOUNTER — Encounter: Payer: Self-pay | Admitting: Internal Medicine

## 2015-07-03 VITALS — BP 118/71 | HR 78 | Temp 98.6°F | Resp 15 | Ht 63.0 in | Wt 249.0 lb

## 2015-07-03 DIAGNOSIS — R42 Dizziness and giddiness: Secondary | ICD-10-CM

## 2015-07-03 DIAGNOSIS — I10 Essential (primary) hypertension: Secondary | ICD-10-CM

## 2015-07-03 DIAGNOSIS — F411 Generalized anxiety disorder: Secondary | ICD-10-CM

## 2015-07-03 NOTE — Progress Notes (Signed)
Patient still concerned about her blood pressure  She is concerned she is going to have stroke

## 2015-07-03 NOTE — Progress Notes (Signed)
Crystal Burns, is a 35 y.o. female  WUJ:[id number]  NFA:[id number]  DOB - 26-Aug-1980  Chief Complaint  Patient presents with  . Hypertension        Subjective:   Crystal Burns is a 35 y.o. female here today for a follow up visit.  Very anxious about her BP being so high.  She denies anxiety, depression.  She has 6 kids, but states that they do not stress her out.  States that her bp is normal all day until in afternoon, when it starts shooting up.  She takes her bp 5-6 times a day, and has been to the ED 3/23, 3/24, 3/25 for htn.  All bps chked in notes are normaltensive, but Patient adamant when she checks it at home they are extremely high.  Pt brought in her BP cuff/monitor, with her bp records.  Most bps sbp 110-120s, some transient elevated bps noted.  Of note, pt states that when her bp is normal range, she has been feeling "dizzy/lightheaded" because she is not use to the normals numbers.    Patient has No headache, No chest pain, No abdominal pain - No Nausea, No new weakness tingling or numbness, No Cough - SOB.  Denies si/hi/vh/ah.   No problems updated.  ALLERGIES: Allergies  Allergen Reactions  . Shellfish Allergy Anaphylaxis  . Bee Venom Hives and Swelling    PAST MEDICAL HISTORY: Past Medical History  Diagnosis Date  . History of gonorrhea   . Gestational diabetes     2012  . Hypertension     2004  . Asthma     2010-only bothers pt during bad cold  . Depression     2010-had when mother passed away  . Pregnancy induced hypertension     MEDICATIONS AT HOME: Prior to Admission medications   Medication Sig Start Date End Date Taking? Authorizing Provider  carvedilol (COREG) 12.5 MG tablet Take 1 tablet (12.5 mg total) by mouth 2 (two) times daily with a meal. 06/16/15  Yes Charm Rings, MD  lisinopril (PRINIVIL,ZESTRIL) 10 MG tablet Take 1 tablet (10 mg total) by mouth daily. 06/16/15  Yes Charm Rings, MD  medroxyPROGESTERone (DEPO-PROVERA) 150  MG/ML injection Inject 1 mL (150 mg total) into the muscle every 3 (three) months. 02/27/15  Yes Rachelle A Denney, CNM  chlorhexidine gluconate (PERIDEX) 0.12 % solution Use as directed 15 mLs in the mouth or throat 2 (two) times daily. Swish for 30 seconds and spit twice daily after brushing Patient not taking: Reported on 06/29/2015 06/12/15   Dessa Phi, MD  ibuprofen (ADVIL,MOTRIN) 600 MG tablet Take 600 mg by mouth every 6 (six) hours as needed for moderate pain. Reported on 07/03/2015 05/15/15   Historical Provider, MD  ibuprofen (ADVIL,MOTRIN) 800 MG tablet Take 1 tablet (800 mg total) by mouth every 6 (six) hours as needed. Patient not taking: Reported on 06/29/2015 05/06/15   Domenick Gong, MD     Objective:   Filed Vitals:   07/03/15 1709  BP: 118/71  Pulse: 78  Temp: 98.6 F (37 C)  Resp: 15  Height:  (1.6 m)  Weight: 249 lb (112.946 kg)  SpO2: 95%   Pt's bp cuff: 118/88 (w/ me)  Exam General appearance : Awake, alert, not in any distress. Speech Clear. Not toxic looking. Morbid obese.  Speaks in full sentences.  HEENT: Atraumatic and Normocephalic, pupils equally reactive to light. Neck: supple, no JVD.  Chest:Good air entry bilaterally, no added sounds. CVS:  S1 S2 regular, no murmurs/gallups or rubs. Abdomen: morbid obese, Bowel sounds active, Non tender. Extremities: B/L Lower Ext shows no edema, both legs are warm to touch Neurology: Awake alert, and oriented X 3, CN II-XII grossly intact, Non focal Skin:No Rash  Data Review Lab Results  Component Value Date   HGBA1C 5.60 05/18/2015   HGBA1C  01/30/2008    6.1 (NOTE)   The ADA recommends the following therapeutic goal for glycemic   control related to Hgb A1C measurement:   Goal of Therapy:   < 7.0% Hgb A1C   Reference: American Diabetes Association: Clinical Practice   Recommendations 2008, Diabetes Care,  2008, 31:(Suppl 1).     Assessment & Plan   1. Essential htn - well controlled, certainly  there are some episodes noted in her book w/ elavated bp, but not certain if anxiety component making things worse - labs 3/23 unremarkable, renal function nml - reassurance given, recd breathing exercises/walking to help w/ anxiety - [facility] days for bp chk. - if bp still labile, despite anxiety/lifestyle modifications, consider RAS/pheo wkup.  2. Anxiety /ocd? - spent 20mins dw pt methods to help with anxiety (which she denies)., including breathing exercises and walking after dinner. - sw cs w. Asher MuirJamie as well.  3. Morbid obesity  per pt, has lost 50lbs since her last baby, encouraged DASH diet and increase activity.   Patient have been counseled extensively about nutrition and exercise   fu 2 wks for bp /anxiety fu.  The patient was given clear instructions to go to ER or return to medical center if symptoms don't improve, worsen or new problems develop. The patient verbalized understanding. The patient was told to call to get lab results if they haven't heard anything in the next week.   Pete Glatterawn T Anntionette Madkins, MD, MBA/MHA [facility] [place], [place] [phone]   07/03/2015, 5:45 PM

## 2015-07-03 NOTE — ED Provider Notes (Signed)
CSN: [id number]     Arrival date & time 07/01/15  1425 History   First MD Initiated Contact with Patient 07/01/15 1624     Chief Complaint  Patient presents with  . Hypertension   (Consider location/radiation/quality/duration/timing/severity/associated sxs/prior Treatment) HPI Pt states that over the last few days she has spikes in her BP in the evening around 8 pm. She staes she has been t the ER 2 days this week, and was discharged without change in treatment, as BP normalized while resting in the ED.  Recently had medications adjusted because of low BP and palpitations No chest pain, SOB. Past Medical History  Diagnosis Date  . History of gonorrhea   . Gestational diabetes     2012  . Hypertension     2004  . Asthma     2010-only bothers pt during bad cold  . Depression     2010-had when mother passed away  . Pregnancy induced hypertension    Past Surgical History  Procedure Laterality Date  . Dilation and curettage of uterus    . Cholecystectomy    . Wisdom tooth extraction     Family History  Problem Relation Age of Onset  . Hypertension Mother   . Diabetes Mother   . Cancer Mother   . Hypertension Father   . Diabetes Father   . Hyperlipidemia Father   . Arthritis    . Asthma    . Breast cancer    . Heart failure    . Congenital heart disease    . Depression    . Heart attack     Social History  Substance Use Topics  . Smoking status: Former Smoker    Types: Cigarettes    Quit date: 10/31/2010  . Smokeless tobacco: Never Used  . Alcohol Use: No   OB History    Gravida Para Term Preterm AB TAB SAB Ectopic Multiple Living   0 4 0 4 0 0 6     Review of Systems High bp  In the evening Allergies  Shellfish allergy and Bee venom  Home Medications   Prior to Admission medications   Medication Sig Start Date End Date Taking? Authorizing Provider  carvedilol (COREG) 12.5 MG tablet Take 1 tablet (12.5 mg total) by mouth 2 (two) times daily with a  meal. 06/16/15  Yes Charm Rings, MD  lisinopril (PRINIVIL,ZESTRIL) 10 MG tablet Take 1 tablet (10 mg total) by mouth daily. 06/16/15  Yes Charm Rings, MD  amoxicillin (AMOXIL) 500 MG capsule Take 2 capsules (1,000 mg total) by mouth 2 (two) times daily. 06/16/15   Charm Rings, MD  chlorhexidine gluconate (PERIDEX) 0.12 % solution Use as directed 15 mLs in the mouth or throat 2 (two) times daily. Swish for 30 seconds and spit twice daily after brushing Patient not taking: Reported on 06/29/2015 06/12/15   Dessa Phi, MD  ibuprofen (ADVIL,MOTRIN) 600 MG tablet Take 600 mg by mouth every 6 (six) hours as needed for moderate pain.  05/15/15   Historical Provider, MD  ibuprofen (ADVIL,MOTRIN) 800 MG tablet Take 1 tablet (800 mg total) by mouth every 6 (six) hours as needed. Patient not taking: Reported on 06/29/2015 05/06/15   Domenick Gong, MD  medroxyPROGESTERone (DEPO-PROVERA) 150 MG/ML injection Inject 1 mL (150 mg total) into the muscle every 3 (three) months. Patient not taking: Reported on 06/29/2015 02/27/15   Roe Coombs, CNM   Meds Ordered and Administered this Visit  Medications - No data to display  BP 123/82 mmHg  Pulse 67  Temp(Src) 98.1 F (36.7 C) (Oral)  SpO2 100% No data found.   Physical Exam NURSES NOTES AND VITAL SIGNS REVIEWED. CONSTITUTIONAL: Well developed, well nourished, no acute distress HEENT: normocephalic, atraumatic, right and left TM's are normal EYES: Conjunctiva normal NECK:normal ROM, supple, no adenopathy PULMONARY:No respiratory distress, normal effort, Lungs: CTAb/l, no wheezes, or increased work of breathing CARDIOVASCULAR: RRR, no murmur ABDOMEN: soft, ND, NT, +'ve BS MUSCULOSKELETAL: Normal ROM of all extremities,  SKIN: warm and dry without rash PSYCHIATRIC: Mood and affect, behavior are normal  ED Course  Procedures (including critical care time)  Labs Review Labs Reviewed - No data to display  Imaging Review No results  found.   Visual Acuity Review  Right Eye Distance:   Left Eye Distance:   Bilateral Distance:    Right Eye Near:   Left Eye Near:    Bilateral Near:       Blood pressure at this time correlates with BP on patients cuff.  No indications for treatment change at this time.   MDM   1. Essential hypertension    Despite reassurance patient was not happy that her blood pressure was not being lowered.  I have advised that she needs to review her plan with her PCP on Monday. Pt states she will be in the ER the next 2 nights because her pressure will go up. I have suggested taking meds at different times. Pt is crying and states I am going to give her a stroke. She has 6 kids at home.      Tharon AquasFrank C Patrick, [place] 07/03/15 [id number]

## 2015-07-03 NOTE — Progress Notes (Signed)
ASSESSMENT: Pt currently experiencing Anxiety. [facility] would benefit from brief therapeutic interventions regarding coping with symptoms of anxiety.  Stage of Change: precontemplative  PLAN: 1. F/U with behavioral health consultant in one week 2. Psychiatric Medications: none. 3. Behavioral recommendation(s):   -Do daily relaxation breathing exercises  SUBJECTIVE: Pt. referred by Dr Julien NordmannLangeland for symptoms of anxiety:  Pt. reports the following symptoms/concerns: Pt states that she is not stressed, that she does not have anxiety, her primary concern is blood pressure "spiking" up in the evenings.  Duration of problem: Undetermined number of months Severity: moderate  OBJECTIVE: Orientation & Cognition: Oriented x3. Thought processes normal and appropriate to situation. Mood: appropriate. Affect: appropriate Appearance: appropriate Risk of harm to self or others: no known risk of harm to self or others Substance use: none Assessments administered: none today  Diagnosis: Anxiety CPT Code: F41.1 -------------------------------------------- Other(s) present in the room: none  Time spent with patient in exam room: 20 minutes, 5:30-5:50pm   Depression screen [facility] 2/9 06/19/2015 06/05/2015 05/24/2015 05/18/2015 12/30/2014  Decreased Interest 0 0 0 0 0  Down, Depressed, Hopeless 0 0 0 0 0  PHQ - 2 Score 0 0 0 0 0

## 2015-07-03 NOTE — Patient Instructions (Signed)
[facility] this Weds.  DASH Eating Plan DASH stands for "Dietary Approaches to Stop Hypertension." The DASH eating plan is a healthy eating plan that has been shown to reduce high blood pressure (hypertension). Additional health benefits may include reducing the risk of type 2 diabetes mellitus, heart disease, and stroke. The DASH eating plan may also help with weight loss. WHAT DO I NEED TO KNOW ABOUT THE DASH EATING PLAN? For the DASH eating plan, you will follow these general guidelines:  Choose foods with a percent daily value for sodium of less than 5% (as listed on the food label).  Use salt-free seasonings or herbs instead of table salt or sea salt.  Check with your health care provider or pharmacist before using salt substitutes.  Eat lower-sodium products, often labeled as "lower sodium" or "no salt added."  Eat fresh foods.  Eat more vegetables, fruits, and low-fat dairy products.  Choose whole grains. Look for the word "whole" as the first word in the ingredient list.  Choose fish and skinless chicken or [place]key more often than red meat. Limit fish, poultry, and meat to 6 oz (170 g) each day.  Limit sweets, desserts, sugars, and sugary drinks.  Choose heart-healthy fats.  Limit cheese to 1 oz (28 g) per day.  Eat more home-cooked food and less restaurant, buffet, and fast food.  Limit fried foods.  Cook foods using methods other than frying.  Limit canned vegetables. If you do use them, rinse them well to decrease the sodium.  When eating at a restaurant, ask that your food be prepared with less salt, or no salt if possible. WHAT FOODS CAN I EAT? Seek help from a dietitian for individual calorie needs. Grains Whole grain or whole wheat bread. Brown rice. Whole grain or whole wheat pasta. Quinoa, bulgur, and whole grain cereals. Low-sodium cereals. Corn or whole wheat flour tortillas. Whole grain cornbread. Whole grain crackers. Low-sodium  crackers. Vegetables Fresh or frozen vegetables (raw, steamed, roasted, or grilled). Low-sodium or reduced-sodium tomato and vegetable juices. Low-sodium or reduced-sodium tomato sauce and paste. Low-sodium or reduced-sodium canned vegetables.  Fruits All fresh, canned (in natural juice), or frozen fruits. Meat and Other Protein Products Ground beef (85% or leaner), grass-fed beef, or beef trimmed of fat. Skinless chicken or [place]key. Ground chicken or [place]key. Pork trimmed of fat. All fish and seafood. Eggs. Dried beans, peas, or lentils. Unsalted nuts and seeds. Unsalted canned beans. Dairy Low-fat dairy products, such as skim or 1% milk, 2% or reduced-fat cheeses, low-fat ricotta or cottage cheese, or plain low-fat yogurt. Low-sodium or reduced-sodium cheeses. Fats and Oils Tub margarines without trans fats. Light or reduced-fat mayonnaise and salad dressings (reduced sodium). Avocado. Safflower, olive, or canola oils. Natural peanut or almond butter. Other Unsalted popcorn and pretzels. The items listed above may not be a complete list of recommended foods or beverages. Contact your dietitian for more options. WHAT FOODS ARE NOT RECOMMENDED? Grains White bread. White pasta. White rice. Refined cornbread. Bagels and croissants. Crackers that contain trans fat. Vegetables Creamed or fried vegetables. Vegetables in a cheese sauce. Regular canned vegetables. Regular canned tomato sauce and paste. Regular tomato and vegetable juices. Fruits Dried fruits. Canned fruit in light or heavy syrup. Fruit juice. Meat and Other Protein Products Fatty cuts of meat. Ribs, chicken wings, bacon, sausage, bologna, salami, chitterlings, fatback, hot dogs, bratwurst, and packaged luncheon meats. Salted nuts and seeds. Canned beans with salt. Dairy Whole or 2% milk, cream, half-and-half, and cream  cheese. Whole-fat or sweetened yogurt. Full-fat cheeses or blue cheese. Nondairy creamers and whipped toppings.  Processed cheese, cheese spreads, or cheese curds. Condiments Onion and garlic salt, seasoned salt, table salt, and sea salt. Canned and packaged gravies. Worcestershire sauce. Tartar sauce. Barbecue sauce. Teriyaki sauce. Soy sauce, including reduced sodium. Steak sauce. Fish sauce. Oyster sauce. Cocktail sauce. Horseradish. Ketchup and mustard. Meat flavorings and tenderizers. Bouillon cubes. Hot sauce. Tabasco sauce. Marinades. Taco seasonings. Relishes. Fats and Oils Butter, stick margarine, lard, shortening, ghee, and bacon fat. Coconut, palm kernel, or palm oils. Regular salad dressings. Other Pickles and olives. Salted popcorn and pretzels. The items listed above may not be a complete list of foods and beverages to avoid. Contact your dietitian for more information. WHERE CAN I FIND MORE INFORMATION? National Heart, Lung, and Blood Institute: travelstabloid.com   This information is not intended to replace advice given to you by your health care provider. Make sure you discuss any questions you have with your health care provider.   Document Released: 03/14/2011 Document Revised: 04/15/2014 Document Reviewed: 01/27/2013 Elsevier Interactive Patient Education [employer].

## 2015-07-03 NOTE — Telephone Encounter (Signed)
Spoke to patient and confirmed her appointment.  She is going to bring her log for MD review

## 2015-07-05 ENCOUNTER — Other Ambulatory Visit: Payer: Self-pay | Admitting: Obstetrics

## 2015-07-05 ENCOUNTER — Encounter: Payer: Self-pay | Admitting: Pharmacist

## 2015-07-05 ENCOUNTER — Ambulatory Visit: Payer: Medicaid Other | Attending: Family Medicine | Admitting: Pharmacist

## 2015-07-05 VITALS — BP 116/78 | HR 69

## 2015-07-05 DIAGNOSIS — I10 Essential (primary) hypertension: Secondary | ICD-10-CM | POA: Insufficient documentation

## 2015-07-05 DIAGNOSIS — Z79899 Other long term (current) drug therapy: Secondary | ICD-10-CM | POA: Insufficient documentation

## 2015-07-05 NOTE — Progress Notes (Signed)
S:    Patient arrives with her two children.    Presents to the clinic for blood pressure evaluation. Patient was referred on 07/03/15 by Dr. Julien NordmannLangeland.  Patient was last seen by Primary Care Provider on 06/19/15 (Dr. Venetia NightAmao).   Patient reports adherence with medications.  Current BP Medications include:  Carvedilol 12.5 mg BID and lisinopril 10 mg daily  Dietary habits include: has cut salt out of her diet and won't season her food at home. She avoids fast food and processed foods. She has lost 50 pounds since giving birth.  Patient reports slight headaches during the day, numbness in her fingers that comes and goes, and pressure on her face that comes and goes. She denies chest pain, SOB, severe headache, visual changes, weakness, etc.   She is very frustrated that her blood pressure goes up at home. It is elevated from 8-10 pm. She reports that she sits on the sofa at that time. She denies stress.  Home blood pressures range from 100s-150s/70s-110s. She uses a Relion blood pressure cuff and checks it up to 6 times per day. She checks it in different arms. She reports that she gets a higher reading in her right arm.    O:   Last 3 Office BP readings: BP Readings from Last 3 Encounters:  07/05/15 116/78  07/03/15 118/71  07/01/15 123/82    BMET    Component Value Date/Time   NA 136 06/29/2015 2112   K 4.0 06/29/2015 2112   CL 105 06/29/2015 2112   CO2 22 06/29/2015 2112   GLUCOSE 94 06/29/2015 2112   BUN 7 06/29/2015 2112   CREATININE 0.58 06/29/2015 2112   CREATININE 0.59 04/22/2014 1635   CALCIUM 10.2 06/29/2015 2112   GFRNONAA >60 06/29/2015 2112   GFRNONAA >89 04/22/2014 1635   GFRAA >60 06/29/2015 2112   GFRAA >89 04/22/2014 1635    A/P: Hypertension longstanding currently controlled on current medications. Blood pressures in both arms the same. Continued all medications as prescribed. Consulted Dr. Venetia NightAmao who asked me to have patient schedule follow up with her on Friday.  Encouraged patient to keep a diary of her symptoms that she experiences and when during the day so that she can give them to Dr. Venetia NightAmao when she sees her. Patient verbalized understanding.   Results reviewed and written information provided.   Total time in face-to-face counseling 20 minutes.  F/U Clinic Visit with Dr. Venetia NightAmao on 07/07/15.

## 2015-07-05 NOTE — Patient Instructions (Signed)
Thanks for coming in to see me  I know you are frustrated - we need to see if there is a pattern of why your blood pressure goes up at night  Record what you do each hour when you get home at night. This will help us see if there are certain things that are occurring at that time that might be increasing your blood pressure.  Come back and see Dr. Venetia NightAmao on Friday  Hypertension Your health care provider may prescribe medicine if lifestyle changes are not enough to get your blood pressure under control, and if one of the following is true:  You are 2618-35 years of age and your systolic blood pressure is above 140.  You are 660 years of age or older, and your systolic blood pressure is above 150.  Your diastolic blood pressure is above 90.  You have diabetes, and your systolic blood pressure is over 140 or your diastolic blood pressure is over 90.  You have kidney disease and your blood pressure is above 140/90.  You have heart disease and your blood pressure is above 140/90. Your personal target blood pressure may vary depending on your medical conditions, your age, and other factors. HOME CARE INSTRUCTIONS  Have your blood pressure rechecked as directed by your health care provider.   Take medicines only as directed by your health care provider. Follow the directions carefully. Blood pressure medicines must be taken as prescribed. The medicine does not work as well when you skip doses. Skipping doses also puts you at risk for problems.  Do not smoke.   Monitor your blood pressure at home as directed by your health care provider. SEEK MEDICAL CARE IF:   You think you are having a reaction to medicines taken.  You have recurrent headaches or feel dizzy.  You have swelling in your ankles.  You have trouble with your vision. SEEK IMMEDIATE MEDICAL CARE IF:  You develop a severe headache or confusion.  You have unusual weakness, numbness, or feel faint.  You have severe chest  or abdominal pain.  You vomit repeatedly.  You have trouble breathing. MAKE SURE YOU:   Understand these instructions.  Will watch your condition.  Will get help right away if you are not doing well or get worse.   This information is not intended to replace advice given to you by your health care provider. Make sure you discuss any questions you have with your health care provider.   Document Released: 03/25/2005 Document Revised: 08/09/2014 Document Reviewed: 01/15/2013 Elsevier Interactive Patient Education [employer] Elsevier Inc.

## 2015-07-07 ENCOUNTER — Other Ambulatory Visit: Payer: Self-pay | Admitting: Obstetrics

## 2015-07-07 ENCOUNTER — Ambulatory Visit: Payer: Medicaid Other | Attending: Family Medicine | Admitting: Family Medicine

## 2015-07-07 ENCOUNTER — Encounter: Payer: Self-pay | Admitting: Family Medicine

## 2015-07-07 VITALS — BP 105/72 | HR 68 | Temp 98.4°F | Resp 16 | Ht 63.0 in | Wt 247.0 lb

## 2015-07-07 DIAGNOSIS — I10 Essential (primary) hypertension: Secondary | ICD-10-CM | POA: Diagnosis present

## 2015-07-07 DIAGNOSIS — R5383 Other fatigue: Secondary | ICD-10-CM | POA: Diagnosis not present

## 2015-07-07 DIAGNOSIS — Z79899 Other long term (current) drug therapy: Secondary | ICD-10-CM | POA: Insufficient documentation

## 2015-07-07 DIAGNOSIS — R42 Dizziness and giddiness: Secondary | ICD-10-CM | POA: Diagnosis not present

## 2015-07-07 LAB — CBC WITH DIFFERENTIAL/PLATELET
BASOS ABS: 0 10*3/uL (ref 0.0–0.1)
Basophils Relative: 0 % (ref 0–1)
EOS ABS: 0.2 10*3/uL (ref 0.0–0.7)
Eosinophils Relative: 2 % (ref 0–5)
HCT: 36.7 % (ref 36.0–46.0)
HEMOGLOBIN: 12 g/dL (ref 12.0–15.0)
LYMPHS ABS: 2.1 10*3/uL (ref 0.7–4.0)
Lymphocytes Relative: 27 % (ref 12–46)
MCH: 26 pg (ref 26.0–34.0)
MCHC: 32.7 g/dL (ref 30.0–36.0)
MCV: 79.6 fL (ref 78.0–100.0)
MPV: 9.5 fL (ref 8.6–12.4)
Monocytes Absolute: 0.5 10*3/uL (ref 0.1–1.0)
Monocytes Relative: 6 % (ref 3–12)
NEUTROS ABS: 5 10*3/uL (ref 1.7–7.7)
NEUTROS PCT: 65 % (ref 43–77)
PLATELETS: 373 10*3/uL (ref 150–400)
RBC: 4.61 MIL/uL (ref 3.87–5.11)
RDW: 14.5 % (ref 11.5–15.5)
WBC: 7.7 10*3/uL (ref 4.0–10.5)

## 2015-07-07 LAB — VITAMIN B12: Vitamin B-12: 675 pg/mL (ref 200–1100)

## 2015-07-07 LAB — TSH: TSH: 1.04 mIU/L

## 2015-07-07 MED ORDER — LOSARTAN POTASSIUM 25 MG PO TABS: 25.0000 mg | ORAL_TABLET | Freq: Every day | ORAL | Status: DC

## 2015-07-07 MED FILL — IBUPROFEN 600 MG TABLET: 600 | 22 days supply | Qty: 90 | Fill #0 | Status: TO

## 2015-07-07 MED FILL — LOSARTAN POTASSIUM 25 MG TA: 25 | 30 days supply | Qty: 30 | Fill #0 | Status: TO

## 2015-07-07 NOTE — Progress Notes (Signed)
Subjective:  Patient ID: Crystal Burns, female    DOB: [birth year]  Age: 35 y.o. MRN: [id number]  CC: Hypertension   HPI Crystal Burns old female with a history of hypertension who comes in for follow-up of her blood pressure. She has had multiple ED visits and multiple office visit for blood pressure concerns and has had changes in antihypertensive regimen due to complaints of elevated blood pressure and other times of low blood pressure. At one of her previous visits she was persuaded her blood pressure had been elevated from her home bp readings and wanted me to increase the dose of her antihypertensive despite obtaining a normal blood pressure in the clinic. A couple of days later she presented to urgent care complaining of lightheadedness and the doses of her antihypertensives were reduced. She has called the clinic on multiple occasions complaining about her antihypertensive regimen.  Today she informs me her blood pressures have been on the low side; review of her log indicates blood pressures in the 110-120 range however she informs me that last week she had blood pressures in the 180s systolic. She is worried that she might have a stroke from her blood pressure and complains of lightheadedness, fatigue and occasional palpitations and checked her blood pressure every 2 hours. She admits though that resting for a few hours does bring her blood pressure down. Working on low-sodium diet, exercise and has lost a couple pounds. Denies shortness of breath, tingling of extremities or speech difficulties.  Outpatient Prescriptions Prior to Visit  Medication Sig Dispense Refill  . carvedilol (COREG) 12.5 MG tablet Take 1 tablet (12.5 mg total) by mouth 2 (two) times daily with a meal. 120 tablet 5  . chlorhexidine gluconate (PERIDEX) 0.12 % solution Use as directed 15 mLs in the mouth or throat 2 (two) times daily. Swish for 30 seconds and spit twice daily after brushing 118 mL 0  .  ibuprofen (ADVIL,MOTRIN) 600 MG tablet TAKE ONE TABLET EVERY 6 HOURS AS NEEDED FOR MILD PAIN 90 tablet 0  . medroxyPROGESTERone (DEPO-PROVERA) 150 MG/ML injection Inject 1 mL (150 mg total) into the muscle every 3 (three) months. 150 mL 3  . lisinopril (PRINIVIL,ZESTRIL) 10 MG tablet Take 1 tablet (10 mg total) by mouth daily. 30 tablet 0   No facility-administered medications prior to visit.    ROS Review of Systems  Constitutional: Positive for fatigue. Negative for activity change and appetite change.  HENT: Negative for sinus pressure and sore throat.   Respiratory: Negative for chest tightness, shortness of breath and wheezing.   Cardiovascular: Positive for palpitations. Negative for chest pain.  Gastrointestinal: Negative for abdominal pain, constipation and abdominal distention.  Genitourinary: Negative.   Musculoskeletal: Negative.   Neurological: Positive for light-headedness.  Psychiatric/Behavioral: Negative for behavioral problems and dysphoric mood.    Objective:  BP 105/72 mmHg  Pulse 68  Temp(Src) 98.4 F (36.9 C) (Oral)  Resp 16  Ht 5\' 3"  (1.6 m)  Wt 247 lb (112.038 kg)  BMI 43.76 kg/m2  SpO2 98%  BP/Weight 07/07/2015 07/05/2015 07/03/2015  Systolic BP 105 116 118  Diastolic BP 72 78 71  Wt. (Lbs) 247 - 249  BMI 43.76 - 44.12      Physical Exam  Constitutional: She is oriented to person, place, and time. She appears well-developed and well-nourished.  Morbidly obese  Cardiovascular: Normal rate, normal heart sounds and intact distal pulses.   No murmur heard. Pulmonary/Chest: Effort normal and breath sounds normal. She  has no wheezes. She has no rales. She exhibits no tenderness.  Abdominal: Soft. Bowel sounds are normal. She exhibits no distension and no mass. There is no tenderness.  Musculoskeletal: Normal range of motion.  Neurological: She is alert and oriented to person, place, and time.     Assessment & Plan:   1. Other fatigue Unknown  etiology - TSH - Vitamin D, 25-hydroxy - Vitamin B12  2. Lightheadedness Patient adamantly denies that she has vertigo and so I will hold off on trial of meclizine - CBC with Differential  3. Essential hypertension Switched from lisinopril to losartan due to cough Continue Coreg (patient advised that if blood pressure remains on the low side she should take 6.25 rather than 12.5 mg of Coreg) We'll try to setup home telemetry monitoring giving the patient is really anxious about her blood pressure and seems to take it every 2 hours; cannot exclude underlying anxiety as explanation for symptoms described above. The fact that she has also had blood pressure swings from 180 systolic to 110 is also concerning. - losartan (COZAAR) 25 MG tablet; Take 1 tablet (25 mg total) by mouth daily.  Dispense: 30 tablet; Refill: 2   Meds ordered this encounter  Medications  . losartan (COZAAR) 25 MG tablet    Sig: Take 1 tablet (25 mg total) by mouth daily.    Dispense:  30 tablet    Refill:  2    Follow-up: Return in about 1 week (around 07/14/2015) for follow up of hypertension.   Jaclyn Shaggy MD

## 2015-07-07 NOTE — Progress Notes (Signed)
F/U HTN Pt stated been feeling dizzy after taking coreg  BP  113/79 this morning  No tobacco user  No suicidal thoughts in the past two weeks

## 2015-07-08 LAB — VITAMIN D 25 HYDROXY (VIT D DEFICIENCY, FRACTURES): VIT D 25 HYDROXY: 9 ng/mL — AB (ref 30–100)

## 2015-07-10 ENCOUNTER — Telehealth: Payer: Self-pay

## 2015-07-10 ENCOUNTER — Telehealth: Payer: Self-pay | Admitting: *Deleted

## 2015-07-10 ENCOUNTER — Other Ambulatory Visit: Payer: Self-pay | Admitting: Family Medicine

## 2015-07-10 DIAGNOSIS — E559 Vitamin D deficiency, unspecified: Secondary | ICD-10-CM

## 2015-07-10 MED ORDER — ERGOCALCIFEROL 1.25 MG (50000 UT) PO CAPS: 50000.0000 [IU] | ORAL_CAPSULE | ORAL | Status: DC

## 2015-07-10 MED FILL — VIT D2 1.25 MG (50,000 UNIT: 1.25 MG | 28 days supply | Qty: 4 | Fill #0

## 2015-07-10 NOTE — Telephone Encounter (Signed)
Spoke to Dr Venetia NightAmao about the options for the patient to receive tele- monitoring services. Calls placed to Myer PeerKim Lanier, [facility] # [phone] and Lennon AlstromLiz, [place], Nursing Team Lead with [facility]4CC # [phone] to inquire about services, including tele-monitoring,  that could possibly be provided to a patient without insurance. Voice mail messages were left requesting a call back to # [phone].

## 2015-07-10 NOTE — Telephone Encounter (Signed)
Call received from Myer PeerKim Lanier, [facility]CC liaison at [facility]. She noted that they are not provide services for the patient without the orange card.

## 2015-07-10 NOTE — Telephone Encounter (Signed)
-----   Message from Jaclyn ShaggyEnobong Amao, MD sent at 07/10/2015  8:43 AM EDT ----- Blood work reveals vitamin D deficiency which could explain her fatigue and so I have sent a prescription for replacement to her pharmacy

## 2015-07-10 NOTE — Telephone Encounter (Signed)
Spoke to patient and verified name and date of birth.  Gave results and instructions to pick up medications.

## 2015-07-11 ENCOUNTER — Emergency Department (HOSPITAL_COMMUNITY): Payer: Medicaid Other

## 2015-07-11 ENCOUNTER — Encounter (HOSPITAL_COMMUNITY): Payer: Self-pay | Admitting: Emergency Medicine

## 2015-07-11 DIAGNOSIS — R002 Palpitations: Secondary | ICD-10-CM | POA: Insufficient documentation

## 2015-07-11 DIAGNOSIS — Z87891 Personal history of nicotine dependence: Secondary | ICD-10-CM | POA: Insufficient documentation

## 2015-07-11 DIAGNOSIS — Z8619 Personal history of other infectious and parasitic diseases: Secondary | ICD-10-CM | POA: Insufficient documentation

## 2015-07-11 DIAGNOSIS — Z79899 Other long term (current) drug therapy: Secondary | ICD-10-CM | POA: Insufficient documentation

## 2015-07-11 DIAGNOSIS — R079 Chest pain, unspecified: Secondary | ICD-10-CM | POA: Diagnosis present

## 2015-07-11 DIAGNOSIS — E669 Obesity, unspecified: Secondary | ICD-10-CM | POA: Diagnosis not present

## 2015-07-11 DIAGNOSIS — J45901 Unspecified asthma with (acute) exacerbation: Secondary | ICD-10-CM | POA: Diagnosis not present

## 2015-07-11 DIAGNOSIS — R42 Dizziness and giddiness: Secondary | ICD-10-CM | POA: Insufficient documentation

## 2015-07-11 DIAGNOSIS — I10 Essential (primary) hypertension: Secondary | ICD-10-CM | POA: Insufficient documentation

## 2015-07-11 DIAGNOSIS — Z8659 Personal history of other mental and behavioral disorders: Secondary | ICD-10-CM | POA: Insufficient documentation

## 2015-07-11 DIAGNOSIS — R0789 Other chest pain: Secondary | ICD-10-CM | POA: Diagnosis not present

## 2015-07-11 DIAGNOSIS — Z8632 Personal history of gestational diabetes: Secondary | ICD-10-CM | POA: Diagnosis not present

## 2015-07-11 LAB — CBC
HEMATOCRIT: 38.3 % (ref 36.0–46.0)
Hemoglobin: 11.9 g/dL — ABNORMAL LOW (ref 12.0–15.0)
MCH: 25.3 pg — AB (ref 26.0–34.0)
MCHC: 31.1 g/dL (ref 30.0–36.0)
MCV: 81.5 fL (ref 78.0–100.0)
Platelets: 347 10*3/uL (ref 150–400)
RBC: 4.7 MIL/uL (ref 3.87–5.11)
RDW: 13.7 % (ref 11.5–15.5)
WBC: 7.9 10*3/uL (ref 4.0–10.5)

## 2015-07-11 LAB — BASIC METABOLIC PANEL
Anion gap: 8 (ref 5–15)
BUN: 10 mg/dL (ref 6–20)
CHLORIDE: 107 mmol/L (ref 101–111)
CO2: 23 mmol/L (ref 22–32)
Calcium: 9.7 mg/dL (ref 8.9–10.3)
Creatinine, Ser: 0.62 mg/dL (ref 0.44–1.00)
GFR calc Af Amer: >60 mL/min (ref >60)
GLUCOSE: 88 mg/dL (ref 65–99)
POTASSIUM: 4.1 mmol/L (ref 3.5–5.1)
Sodium: 138 mmol/L (ref 135–145)

## 2015-07-11 LAB — I-STAT TROPONIN, ED: Troponin i, poc: 0 ng/mL (ref 0.00–0.08)

## 2015-07-11 NOTE — ED Notes (Signed)
Pt. reports intermittent left chest pain onset 4 days ago " sharp " with SOB , lightheaded and dry cough , denies nausea or diaphoresis , pt. added recent change in her antihypertensive medications .

## 2015-07-12 ENCOUNTER — Telehealth: Payer: Self-pay | Admitting: Family Medicine

## 2015-07-12 ENCOUNTER — Emergency Department (HOSPITAL_COMMUNITY)
Admission: EM | Admit: 2015-07-12 | Discharge: 2015-07-12 | Disposition: A | Discharge status: Home / Self Care | Payer: Medicaid Other | Attending: Emergency Medicine | Admitting: Emergency Medicine

## 2015-07-12 DIAGNOSIS — R0789 Other chest pain: Secondary | ICD-10-CM

## 2015-07-12 HISTORY — DX: Obesity, unspecified: E66.9

## 2015-07-12 MED ORDER — NAPROXEN 500 MG PO TABS: 500.0000 mg | ORAL_TABLET | Freq: Two times a day (BID) | ORAL | Status: DC

## 2015-07-12 MED FILL — NAPROXEN 500 MG TABLET: 500 | 15 days supply | Qty: 30 | Fill #0

## 2015-07-12 NOTE — Discharge Instructions (Signed)

## 2015-07-12 NOTE — Telephone Encounter (Signed)
Patient would like to be contacted by the nurse in regards to chest pain.

## 2015-07-12 NOTE — ED Provider Notes (Signed)
CSN: [id number]     Arrival date & time 07/11/15  2045 History  By signing my name below, I, Bethel Born, attest that this documentation has been prepared under the direction and in the presence of Rolland Porter, MD. Electronically Signed: Bethel Born, ED Scribe. 07/12/2015. 1:47 AM   Chief Complaint  Patient presents with  . Chest Pain   The history is provided by the patient. No language interpreter was used.   NALIAH EDDINGTON is a 35 y.o. female with history of HTN and obesity who presents to the Emergency Department complaining of new, intermittent, sharp, 8/10 in severity, left-sided chest pain with onset 4 days ago. The pain has occurred more frequently over the last 24 hours. She has had no recent trauma and has not performed any recent heavy lifting or physically stressful labor. Associated symptoms include intermittent fluttering palpitations, intermittent SOB, and dry cough. Pt denies nausea and sweating. 3 days ago her lisinopril was replaced with losartan, after discussing the cough and SOB with her PCP, and her carvedilol was decreased. She notes that recently her blood pressure has been running low and she has had intermittent dizziness. [facility] care [facility] at the [facility].    Past Medical History  Diagnosis Date  . History of gonorrhea   . Gestational diabetes     2012  . Hypertension     2004  . Asthma     2010-only bothers pt during bad cold  . Depression     2010-had when mother passed away  . Pregnancy induced hypertension   . Obesity    Past Surgical History  Procedure Laterality Date  . Dilation and curettage of uterus    . Cholecystectomy    . Wisdom tooth extraction     Family History  Problem Relation Age of Onset  . Hypertension Mother   . Diabetes Mother   . Cancer Mother   . Hypertension Father   . Diabetes Father   . Hyperlipidemia Father   . Arthritis    . Asthma    . Breast cancer    . Heart failure    . Congenital heart  disease    . Depression    . Heart attack     Social History  Substance Use Topics  . Smoking status: Former Smoker    Types: Cigarettes    Quit date: 10/31/2010  . Smokeless tobacco: Never Used  . Alcohol Use: No   OB History    Gravida Para Term Preterm AB TAB SAB Ectopic Multiple Living   0 4 0 4 0 0 6     Review of Systems  Constitutional: Negative for fever, chills, diaphoresis, appetite change and fatigue.  HENT: Negative for mouth sores, sore throat and trouble swallowing.   Eyes: Negative for visual disturbance.  Respiratory: Positive for cough and shortness of breath. Negative for chest tightness and wheezing.   Cardiovascular: Positive for chest pain and palpitations.  Gastrointestinal: Negative for nausea, vomiting, abdominal pain, diarrhea and abdominal distention.  Endocrine: Negative for polydipsia, polyphagia and polyuria.  Genitourinary: Negative for dysuria, frequency and hematuria.  Musculoskeletal: Negative for gait problem.  Skin: Negative for color change, pallor and rash.  Neurological: Positive for dizziness. Negative for syncope, light-headedness and headaches.  Hematological: Does not bruise/bleed easily.  Psychiatric/Behavioral: Negative for behavioral problems and confusion.      Allergies  Bee venom and Shellfish allergy  Home Medications   Prior to Admission medications  Medication Sig Start Date End Date Taking? Authorizing Provider  carvedilol (COREG) 12.5 MG tablet Take 1 tablet (12.5 mg total) by mouth 2 (two) times daily with a meal. 06/16/15   Charm RingsErin J Honig, MD  chlorhexidine gluconate (PERIDEX) 0.12 % solution Use as directed 15 mLs in the mouth or throat 2 (two) times daily. Swish for 30 seconds and spit twice daily after brushing 06/12/15   Dessa PhiJosalyn Funches, MD  ergocalciferol (VITAMIN D2) 50000 units capsule Take 1 capsule (50,000 Units total) by mouth once a week. 07/10/15   Jaclyn ShaggyEnobong Amao, MD  ibuprofen (ADVIL,MOTRIN) 600 MG tablet  TAKE ONE TABLET EVERY 6 HOURS AS NEEDED FOR MILD PAIN 07/07/15   Brock Badharles A Harper, MD  losartan (COZAAR) 25 MG tablet Take 1 tablet (25 mg total) by mouth daily. 07/07/15   Jaclyn ShaggyEnobong Amao, MD  medroxyPROGESTERone (DEPO-PROVERA) 150 MG/ML injection Inject 1 mL (150 mg total) into the muscle every 3 (three) months. 02/27/15   Rachelle A Denney, CNM  naproxen (NAPROSYN) 500 MG tablet Take 1 tablet (500 mg total) by mouth 2 (two) times daily. 07/12/15   Rolland PorterMark Tinsley Lomas, MD   BP 133/80 mmHg  Pulse 63  Temp(Src) 98.5 F (36.9 C) (Oral)  Resp 18  Ht 5\' 3"  (1.6 m)  Wt 247 lb (112.038 kg)  BMI 43.76 kg/m2  SpO2 97%  LMP 07/05/2015 Physical Exam  Constitutional: She is oriented to person, place, and time. She appears well-developed and well-nourished. No distress.  HENT:  Head: Normocephalic.  Eyes: Conjunctivae are normal. Pupils are equal, round, and reactive to light. No scleral icterus.  Neck: Normal range of motion. Neck supple. No thyromegaly present.  Cardiovascular: Normal rate and regular rhythm.  Exam reveals no gallop and no friction rub.   No murmur heard. Pulmonary/Chest: Effort normal and breath sounds normal. No respiratory distress. She has no wheezes. She has no rales.  Abdominal: Soft. Bowel sounds are normal. She exhibits no distension. There is no tenderness. There is no rebound.  Musculoskeletal: Normal range of motion. She exhibits tenderness.  Tenderness in left anterior axillary fold   Neurological: She is alert and oriented to person, place, and time.  Skin: Skin is warm and dry. No rash noted.  Psychiatric: She has a normal mood and affect. Her behavior is normal.    ED Course  Procedures (including critical care time) DIAGNOSTIC STUDIES: Oxygen Saturation is 97% on RA,  normal by my interpretation.    COORDINATION OF CARE: 1:45 AM Discussed treatment plan which includes lab work, CXR, EKG with pt at bedside and pt agreed to plan.  Labs Review Labs Reviewed  CBC -  Abnormal; Notable for the following:    Hemoglobin 11.9 (*)    MCH 25.3 (*)    All other components within normal limits  BASIC METABOLIC PANEL  I-STAT TROPOININ, ED    Imaging Review Dg Chest 2 View  07/11/2015  CLINICAL DATA:  Left-sided chest pain and shortness of breath, onset today. EXAM: CHEST  2 VIEW COMPARISON:  07/14/2009 FINDINGS: The cardiomediastinal contours are normal. The lungs are clear. Pulmonary vasculature is normal. No consolidation, pleural effusion, or pneumothorax. No acute osseous abnormalities are seen. Mild broad-based dextroscoliotic curvature of the spine. IMPRESSION: No acute pulmonary process. Electronically Signed   By: Rubye OaksMelanie  Ehinger M.D.   On: 07/11/2015 21:12   I have personally reviewed and evaluated these images and lab results as part of my medical decision-making.   EKG Interpretation   Date/Time:  Tuesday  July 11 2015 20:54:16 EDT Ventricular Rate:  69 PR Interval:  168 QRS Duration: 84 QT Interval:  360 QTC Calculation: 385 R Axis:   -26 Text Interpretation:  Normal sinus rhythm Possible Anterior infarct , age  undetermined Abnormal ECG Confirmed by Fayrene Fearing  MD, Trace Wirick ([id number]) on 07/12/2015  1:42:19 AM      MDM   Final diagnoses:  Chest wall pain    I personally performed the services described in this documentation, which was scribed in my presence. The recorded information has been reviewed and is accurate.    Rolland Porter, MD 07/13/15 [phone]

## 2015-07-12 NOTE — Telephone Encounter (Signed)
Pt. Called requesting to speak with the nurse because she went to the ED on 07/11/15 for chest pain. Pt. Has an appointment on 07/19/15 with her PCP and would like to come in before that time. Pt. Was told that there is nothing available and she stated she would like to speak with the nurse. Please f/u with pt.

## 2015-07-13 NOTE — Telephone Encounter (Signed)
Spoke to patient and told her to keep follow up appointment and to apply for orange card

## 2015-07-14 NOTE — Telephone Encounter (Signed)
Patient called stating that her blood pressure has been really low, patient stated that at the ED they diagnosed her with inflammation on her chest for her chest pain. Patient is still concerned about her pain. Please f/u with pt.

## 2015-07-15 ENCOUNTER — Emergency Department (HOSPITAL_COMMUNITY)
Admission: EM | Admit: 2015-07-15 | Discharge: 2015-07-15 | Disposition: A | Discharge status: Home / Self Care | Payer: Medicaid Other | Attending: Emergency Medicine | Admitting: Emergency Medicine

## 2015-07-15 ENCOUNTER — Encounter (HOSPITAL_COMMUNITY): Payer: Self-pay

## 2015-07-15 DIAGNOSIS — I10 Essential (primary) hypertension: Secondary | ICD-10-CM | POA: Insufficient documentation

## 2015-07-15 DIAGNOSIS — R05 Cough: Secondary | ICD-10-CM | POA: Insufficient documentation

## 2015-07-15 DIAGNOSIS — Z79899 Other long term (current) drug therapy: Secondary | ICD-10-CM | POA: Insufficient documentation

## 2015-07-15 DIAGNOSIS — J45909 Unspecified asthma, uncomplicated: Secondary | ICD-10-CM | POA: Insufficient documentation

## 2015-07-15 DIAGNOSIS — Z8619 Personal history of other infectious and parasitic diseases: Secondary | ICD-10-CM | POA: Insufficient documentation

## 2015-07-15 DIAGNOSIS — M79602 Pain in left arm: Secondary | ICD-10-CM | POA: Diagnosis not present

## 2015-07-15 DIAGNOSIS — Z8632 Personal history of gestational diabetes: Secondary | ICD-10-CM | POA: Diagnosis not present

## 2015-07-15 DIAGNOSIS — Z791 Long term (current) use of non-steroidal anti-inflammatories (NSAID): Secondary | ICD-10-CM | POA: Insufficient documentation

## 2015-07-15 DIAGNOSIS — R42 Dizziness and giddiness: Secondary | ICD-10-CM | POA: Diagnosis not present

## 2015-07-15 DIAGNOSIS — Z8659 Personal history of other mental and behavioral disorders: Secondary | ICD-10-CM | POA: Insufficient documentation

## 2015-07-15 DIAGNOSIS — Z87891 Personal history of nicotine dependence: Secondary | ICD-10-CM | POA: Diagnosis not present

## 2015-07-15 DIAGNOSIS — E669 Obesity, unspecified: Secondary | ICD-10-CM | POA: Insufficient documentation

## 2015-07-15 DIAGNOSIS — R079 Chest pain, unspecified: Secondary | ICD-10-CM | POA: Insufficient documentation

## 2015-07-15 NOTE — ED Provider Notes (Signed)
CSN: [id number]     Arrival date & time 07/15/15  1058 History   First MD Initiated Contact with Patient 07/15/15 1104     No chief complaint on file.    (Consider location/radiation/quality/duration/timing/severity/associated sxs/prior Treatment) HPI   35 year old obese female with history of hypertension presenting complaining of lightheadedness.Patient states for the past several weeks she has had trouble with lightheadedness and dizziness after several blood pressure medication was changed. She mentioned this symptom has been recurrent since she gives birth to her child 5 months ago. Today while sitting at work, she felt lightheadedness, check her blood pressure and states that his systolic blood pressure was 98 which is low for her. She contact her PCP and was recommended to come to the ER for further evaluation. States the blood pressure would fluctuate between low and high. She also developed a dry cough while taking lisinopril so 2 weeks ago her PCP switch her lisinopril with carvedilol and she also takes losartan. Since she has been having bouts of increased dizziness, her PCP also recommend cutting her carvedilol in half which she reports some improvement of symptoms. 4 days ago she was in the ED with complaints of reproducible left-sided chest pain with left arm pain and neck pain. She reports blood work and chest x-ray was obtained and subsequently was told that she has inflammation causing her pain and was given naproxen. She has been taking the medication but has not noticed any significant improvement. She denies having any active chest pain at this time. She did describe pain as a sharp sensation worsening with movement. She also admits that she sleeps on her left side in one position for a prolonged time and thinks it may contribute to her pain. She denies any family history of premature cardiac death. No complaint of fever, chills, headache, vision changes, focal numbness or weakness,  nausea or vomiting or diaphoresis.    Past Medical History  Diagnosis Date  . History of gonorrhea   . Gestational diabetes     2012  . Hypertension     2004  . Asthma     2010-only bothers pt during bad cold  . Depression     2010-had when mother passed away  . Pregnancy induced hypertension   . Obesity    Past Surgical History  Procedure Laterality Date  . Dilation and curettage of uterus    . Cholecystectomy    . Wisdom tooth extraction     Family History  Problem Relation Age of Onset  . Hypertension Mother   . Diabetes Mother   . Cancer Mother   . Hypertension Father   . Diabetes Father   . Hyperlipidemia Father   . Arthritis    . Asthma    . Breast cancer    . Heart failure    . Congenital heart disease    . Depression    . Heart attack     Social History  Substance Use Topics  . Smoking status: Former Smoker    Types: Cigarettes    Quit date: 10/31/2010  . Smokeless tobacco: Never Used  . Alcohol Use: No   OB History    Gravida Para Term Preterm AB TAB SAB Ectopic Multiple Living   11 7 7  0 4 0 4 0 0 6     Review of Systems  All other systems reviewed and are negative.     Allergies  Bee venom and Shellfish allergy  Home Medications   Prior to  Admission medications   Medication Sig Start Date End Date Taking? Authorizing Provider  carvedilol (COREG) 12.5 MG tablet Take 1 tablet (12.5 mg total) by mouth 2 (two) times daily with a meal. 06/16/15   Charm Rings, MD  chlorhexidine gluconate (PERIDEX) 0.12 % solution Use as directed 15 mLs in the mouth or throat 2 (two) times daily. Swish for 30 seconds and spit twice daily after brushing 06/12/15   Dessa Phi, MD  ergocalciferol (VITAMIN D2) 50000 units capsule Take 1 capsule (50,000 Units total) by mouth once a week. 07/10/15   Jaclyn Shaggy, MD  ibuprofen (ADVIL,MOTRIN) 600 MG tablet TAKE ONE TABLET EVERY 6 HOURS AS NEEDED FOR MILD PAIN 07/07/15   Brock Bad, MD  losartan (COZAAR) 25 MG  tablet Take 1 tablet (25 mg total) by mouth daily. 07/07/15   Jaclyn Shaggy, MD  medroxyPROGESTERone (DEPO-PROVERA) 150 MG/ML injection Inject 1 mL (150 mg total) into the muscle every 3 (three) months. 02/27/15   Rachelle A Denney, CNM  naproxen (NAPROSYN) 500 MG tablet Take 1 tablet (500 mg total) by mouth 2 (two) times daily. 07/12/15   Rolland Porter, MD   LMP 07/05/2015 Physical Exam  Constitutional: She is oriented to person, place, and time. She appears well-developed and well-nourished. No distress.  Obese African-American female in no acute discomfort.  HENT:  Head: Atraumatic.  Eyes: Conjunctivae and EOM are normal. Pupils are equal, round, and reactive to light.  Neck: Normal range of motion. Neck supple.  Mild left paracervical spinal muscle tenderness and tenderness along left trapezius.  Cardiovascular: Normal rate, regular rhythm and intact distal pulses.  Exam reveals no gallop and no friction rub.   No murmur heard. Pulmonary/Chest: Effort normal and breath sounds normal. No respiratory distress. She has no wheezes. She has no rales. She exhibits tenderness (Tenderness along left anterior chest and left arm on palpation without focal point tenderness, crepitus, or emphysema. No overlying skin changes.).  Abdominal: Soft. There is no tenderness.  Musculoskeletal: She exhibits no edema.  Neurological: She is alert and oriented to person, place, and time.  Skin: No rash noted.  Psychiatric: She has a normal mood and affect.  Nursing note and vitals reviewed.   ED Course  Procedures (including critical care time) Labs Review Labs Reviewed - No data to display  Imaging Review No results found. I have personally reviewed and evaluated these images and lab results as part of my medical decision-making.   EKG Interpretation   Date/Time:  Saturday July 15 2015 11:44:27 EDT Ventricular Rate:  62 PR Interval:  170 QRS Duration: 86 QT Interval:  396 QTC Calculation: 402 R  Axis:   16 Text Interpretation:  Sinus rhythm Low voltage, precordial leads Otherwise  normal ECG No significant change since last tracing Confirmed by KNOTT MD,  DANIEL ([id number]) on 07/15/2015 11:51:49 AM      MDM   Final diagnoses:  Light headedness    BP 118/78 mmHg  Pulse 62  Temp(Src) 98.4 F (36.9 C) (Oral)  Resp 16  Ht  (1.6 m)  Wt 112.038 kg  BMI 43.76 kg/m2  SpO2 100%  LMP 07/05/2015   11:40 AM Patient here with complaints of lightheadedness likely secondary to her pressure while being on blood pressure medication. She will likely benefit from a medication adjustment and follow-up with the PCP. The symptom does not appears to be peripheral central vertigo. She also mentioned having left-sided chest pain and arm pain which is reproducible on  exam and she was seen in the ED 4 days ago for the same complaint and was diagnosed with having muscle skeletal pain. I still believe her symptoms likely muscle skeletal due to the way that she sleep.  I have low suspicion for ACS versus PE versus dissection or other acute emergent medical condition since the symptom has been recurrent for several months. I will recheck an EKG and will check orthostatic vital sign. Anticipate holding off her blood pressure medication as patient follow-up with her PCP for further care.  11:53 AM Patient has a normal orthostatic vital sign and her EKG without acute changes are concerning arrhythmia. Patient has another appointment with her PCP in 4 days. She is currently asymptomatic. Her blood pressure is stable. I will encourage patient to hold off taking her losartan until she can follow-up with her PCP in 4 days. Return precaution discussed.  Fayrene Helper, PA-C 07/15/15 1349  Lyndal Pulley, MD 07/15/15 [phone]

## 2015-07-15 NOTE — Discharge Instructions (Signed)
Your symptoms may be due to your blood pressure medication.  Please hold off taking your Lorsartan and continue taking your Coreg until you can follow up with your primary care provider in 4 days for a recheck.  Return to the ER if you have any concerns.    Dizziness Dizziness is a common problem. It is a feeling of unsteadiness or light-headedness. You may feel like you are about to faint. Dizziness can lead to injury if you stumble or fall. Anyone can become dizzy, but dizziness is more common in older adults. This condition can be caused by a number of things, including medicines, dehydration, or illness. HOME CARE INSTRUCTIONS Taking these steps may help with your condition: Eating and Drinking  Drink enough fluid to keep your urine clear or pale yellow. This helps to keep you from becoming dehydrated. Try to drink more clear fluids, such as water.  Do not drink alcohol.  Limit your caffeine intake if directed by your health care provider.  Limit your salt intake if directed by your health care provider. Activity  Avoid making quick movements.  Rise slowly from chairs and steady yourself until you feel okay.  In the morning, first sit up on the side of the bed. When you feel okay, stand slowly while you hold onto something until you know that your balance is fine.  Move your legs often if you need to stand in one place for a long time. Tighten and relax your muscles in your legs while you are standing.  Do not drive or operate heavy machinery if you feel dizzy.  Avoid bending down if you feel dizzy. Place items in your home so that they are easy for you to reach without leaning over. Lifestyle  Do not use any tobacco products, including cigarettes, chewing tobacco, or electronic cigarettes. If you need help quitting, ask your health care provider.  Try to reduce your stress level, such as with yoga or meditation. Talk with your health care provider if you need help. General  Instructions  Watch your dizziness for any changes.  Take medicines only as directed by your health care provider. Talk with your health care provider if you think that your dizziness is caused by a medicine that you are taking.  Tell a friend or a family member that you are feeling dizzy. If he or she notices any changes in your behavior, have this person call your health care provider.  Keep all follow-up visits as directed by your health care provider. This is important. SEEK MEDICAL CARE IF:  Your dizziness does not go away.  Your dizziness or light-headedness gets worse.  You feel nauseous.  You have reduced hearing.  You have new symptoms.  You are unsteady on your feet or you feel like the room is spinning. SEEK IMMEDIATE MEDICAL CARE IF:  You vomit or have diarrhea and are unable to eat or drink anything.  You have problems talking, walking, swallowing, or using your arms, hands, or legs.  You feel generally weak.  You are not thinking clearly or you have trouble forming sentences. It may take a friend or family member to notice this.  You have chest pain, abdominal pain, shortness of breath, or sweating.  Your vision changes.  You notice any bleeding.  You have a headache.  You have neck pain or a stiff neck.  You have a fever.   This information is not intended to replace advice given to you by your health care  provider. Make sure you discuss any questions you have with your health care provider.   Document Released: 09/18/2000 Document Revised: 08/09/2014 Document Reviewed: 03/21/2014 Elsevier Interactive Patient Education [employer].

## 2015-07-15 NOTE — ED Notes (Signed)
Pt reports since having baby 5 months ago has had dizziness, high blood pressure.  Was put on blood pressure meds and now BP low.  This morning BP 98/68.  Seen PCP last week has another appt 07-19-15.  Pt ambulated to [facility] without difficulties.

## 2015-07-16 ENCOUNTER — Encounter (HOSPITAL_COMMUNITY): Payer: Self-pay | Admitting: Nurse Practitioner

## 2015-07-16 ENCOUNTER — Emergency Department (HOSPITAL_COMMUNITY)
Admission: EM | Admit: 2015-07-16 | Discharge: 2015-07-16 | Disposition: A | Discharge status: Home / Self Care | Payer: Medicaid Other | Attending: Emergency Medicine | Admitting: Emergency Medicine

## 2015-07-16 DIAGNOSIS — Z792 Long term (current) use of antibiotics: Secondary | ICD-10-CM | POA: Insufficient documentation

## 2015-07-16 DIAGNOSIS — Z793 Long term (current) use of hormonal contraceptives: Secondary | ICD-10-CM | POA: Diagnosis not present

## 2015-07-16 DIAGNOSIS — Z0130 Encounter for examination of blood pressure without abnormal findings: Secondary | ICD-10-CM

## 2015-07-16 DIAGNOSIS — Z79899 Other long term (current) drug therapy: Secondary | ICD-10-CM | POA: Diagnosis not present

## 2015-07-16 DIAGNOSIS — R42 Dizziness and giddiness: Secondary | ICD-10-CM | POA: Diagnosis not present

## 2015-07-16 DIAGNOSIS — Z8619 Personal history of other infectious and parasitic diseases: Secondary | ICD-10-CM | POA: Diagnosis not present

## 2015-07-16 DIAGNOSIS — Z87891 Personal history of nicotine dependence: Secondary | ICD-10-CM | POA: Diagnosis not present

## 2015-07-16 DIAGNOSIS — J45909 Unspecified asthma, uncomplicated: Secondary | ICD-10-CM | POA: Insufficient documentation

## 2015-07-16 DIAGNOSIS — E669 Obesity, unspecified: Secondary | ICD-10-CM | POA: Insufficient documentation

## 2015-07-16 DIAGNOSIS — Z8659 Personal history of other mental and behavioral disorders: Secondary | ICD-10-CM | POA: Insufficient documentation

## 2015-07-16 DIAGNOSIS — Z8632 Personal history of gestational diabetes: Secondary | ICD-10-CM | POA: Insufficient documentation

## 2015-07-16 DIAGNOSIS — I10 Essential (primary) hypertension: Secondary | ICD-10-CM | POA: Insufficient documentation

## 2015-07-16 DIAGNOSIS — F419 Anxiety disorder, unspecified: Secondary | ICD-10-CM | POA: Diagnosis not present

## 2015-07-16 DIAGNOSIS — I959 Hypotension, unspecified: Secondary | ICD-10-CM | POA: Diagnosis present

## 2015-07-16 LAB — URINE MICROSCOPIC-ADD ON

## 2015-07-16 LAB — URINALYSIS, ROUTINE W REFLEX MICROSCOPIC
Bilirubin Urine: NEGATIVE
Glucose, UA: NEGATIVE mg/dL
Ketones, ur: NEGATIVE mg/dL
Leukocytes, UA: NEGATIVE
NITRITE: NEGATIVE
Protein, ur: NEGATIVE mg/dL
SPECIFIC GRAVITY, URINE: 1.008 (ref 1.005–1.030)
pH: 6.5 (ref 5.0–8.0)

## 2015-07-16 MED ORDER — IBUPROFEN 200 MG PO TABS
400.0000 mg | ORAL_TABLET | Freq: Once | ORAL | Status: AC
  Administered 2015-07-16: 400 mg via ORAL
  Filled 2015-07-16: qty 2

## 2015-07-16 NOTE — ED Notes (Signed)
She states shes felt lightheaded and fatigued and her blood pressure is running low on her home monitor. She was here yesterday for same and does not feel better. She denies pain. She is A&Ox4, breathing easily

## 2015-07-16 NOTE — ED Notes (Signed)
Dr. Mesner at bedside   

## 2015-07-16 NOTE — ED Notes (Addendum)
Pt was seen yesterday ref dizziness. Pt states the EDP told her not to take the cozaar last night. Pt states she continues to feel dizzy today and her BP was reading low at home. Pt states she had a baby 5 months ago and was started on BP meds for post partum pre eclampsia. Pt also states she is currently on her first period post partum and reports it is not been any heavier than her normal periods.

## 2015-07-17 ENCOUNTER — Telehealth: Payer: Self-pay | Admitting: Obstetrics

## 2015-07-17 ENCOUNTER — Ambulatory Visit: Payer: Self-pay | Admitting: Internal Medicine

## 2015-07-17 NOTE — Telephone Encounter (Signed)
Patient called to let provider know that her blood pressure reading was 125/86 Pulse:74 at 02:30 pm. Prior to this reading at 01:30 pm it was 124/193.  Please call patient back.

## 2015-07-17 NOTE — ED Provider Notes (Signed)
CSN: [id number]     Arrival date & time 07/16/15  1713 History   First MD Initiated Contact with Patient 07/16/15 1745     Chief Complaint  Patient presents with  . Hypotension     (Consider location/radiation/quality/duration/timing/severity/associated sxs/prior Treatment) HPI Comments: Low BP's with intermittent light headedness going on for multiple weeks. No other associated symptoms. Has tried decreasing BP meds with mild improvement. Has appt on Wednesday with pcp to continue BP control.   The history is provided by the patient and the EMS personnel.    Past Medical History  Diagnosis Date  . History of gonorrhea   . Gestational diabetes     2012  . Hypertension     2004  . Asthma     2010-only bothers pt during bad cold  . Depression     2010-had when mother passed away  . Pregnancy induced hypertension   . Obesity    Past Surgical History  Procedure Laterality Date  . Dilation and curettage of uterus    . Cholecystectomy    . Wisdom tooth extraction     Family History  Problem Relation Age of Onset  . Hypertension Mother   . Diabetes Mother   . Cancer Mother   . Hypertension Father   . Diabetes Father   . Hyperlipidemia Father   . Arthritis    . Asthma    . Breast cancer    . Heart failure    . Congenital heart disease    . Depression    . Heart attack     Social History  Substance Use Topics  . Smoking status: Former Smoker    Types: Cigarettes    Quit date: 10/31/2010  . Smokeless tobacco: Never Used  . Alcohol Use: No   OB History    Gravida Para Term Preterm AB TAB SAB Ectopic Multiple Living   0 4 0 4 0 0 6     Review of Systems  Constitutional: Negative for fever.  HENT: Negative for congestion and facial swelling.   Eyes: Negative for discharge and redness.  Respiratory: Negative for cough and shortness of breath.   Cardiovascular: Negative for chest pain.  Gastrointestinal: Negative for abdominal pain and abdominal  distention.  Endocrine: Negative for polydipsia and polyuria.  Genitourinary: Negative for dysuria.  Musculoskeletal: Negative for back pain.  Skin: Negative for wound.  Neurological: Positive for dizziness. Negative for headaches.  All other systems reviewed and are negative.     Allergies  Bee venom; Lisinopril; and Shellfish allergy  Home Medications   Prior to Admission medications   Medication Sig Start Date End Date Taking? Authorizing Provider  acetaminophen (TYLENOL) 500 MG tablet Take 1,000 mg by mouth every 6 (six) hours as needed for moderate pain.   Yes Historical Provider, MD  amoxicillin (AMOXIL) 500 MG tablet Take 1,000 mg by mouth 2 (two) times daily.    Yes Historical Provider, MD  ergocalciferol (VITAMIN D2) 50000 units capsule Take 1 capsule (50,000 Units total) by mouth once a week. Patient taking differently: Take 50,000 Units by mouth once a week. Tuesday 07/10/15  Yes Jaclyn Shaggy, MD  losartan (COZAAR) 25 MG tablet Take 1 tablet (25 mg total) by mouth daily. 07/07/15  Yes Jaclyn Shaggy, MD  naproxen (NAPROSYN) 500 MG tablet Take 1 tablet (500 mg total) by mouth 2 (two) times daily. Patient taking differently: Take 500 mg by mouth 2 (two) times daily as needed for moderate pain.  07/12/15  Yes Rolland PorterMark James, MD  chlorhexidine gluconate (PERIDEX) 0.12 % solution Use as directed 15 mLs in the mouth or throat 2 (two) times daily. Swish for 30 seconds and spit twice daily after brushing 06/12/15   Dessa PhiJosalyn Funches, MD  ibuprofen (ADVIL,MOTRIN) 600 MG tablet TAKE ONE TABLET EVERY 6 HOURS AS NEEDED FOR MILD PAIN 07/07/15   Brock Badharles A Harper, MD  medroxyPROGESTERone (DEPO-PROVERA) 150 MG/ML injection Inject 1 mL (150 mg total) into the muscle every 3 (three) months. 02/27/15   Rachelle A Denney, CNM   BP 122/66 mmHg  Pulse 84  Temp(Src) 98.8 F (37.1 C) (Oral)  Resp 21  Ht 5\' 3"  (1.6 m)  Wt 244 lb 3.2 oz (110.768 kg)  BMI 43.27 kg/m2  SpO2 100%  LMP 07/05/2015 Physical Exam   Constitutional: She appears well-developed and well-nourished.  HENT:  Head: Normocephalic and atraumatic.  Neck: Normal range of motion.  Cardiovascular: Normal rate and regular rhythm.  Exam reveals no gallop and no friction rub.   No murmur heard. Pulmonary/Chest: Effort normal and breath sounds normal. No stridor. No respiratory distress. She has no wheezes. She has no rales.  Abdominal: Soft. She exhibits no distension. There is no tenderness. There is no rebound.  Musculoskeletal: Normal range of motion. She exhibits no edema.  Neurological: She is alert.  Nursing note and vitals reviewed.   ED Course  Procedures (including critical care time) Labs Review Labs Reviewed  URINALYSIS, ROUTINE W REFLEX MICROSCOPIC (NOT AT [facility]) - Abnormal; Notable for the following:    APPearance CLOUDY (*)    Hgb urine dipstick LARGE (*)    All other components within normal limits  URINE MICROSCOPIC-ADD ON - Abnormal; Notable for the following:    Squamous Epithelial / LPF 0-5 (*)    Bacteria, UA RARE (*)    All other components within normal limits    Imaging Review No results found. I have personally reviewed and evaluated these images and lab results as part of my medical decision-making.   EKG Interpretation None      MDM   Final diagnoses:  Blood pressure check    Here with some intermittent low BP's. Has intermittent dizziness but no other associated symptoms. She has quite a bit of anxiety associated with this. i suggested she stop her BP meds all together if she is worried about it that much. No obvious emergencies here. Observed for multiple hours and stable BP's with stable exam.   New Prescriptions: Discharge Medication List as of 07/16/2015  9:18 PM       I have personally and contemperaneously reviewed labs and imaging and used in my decision making as above.   A medical screening exam was performed and I feel the patient has had an appropriate workup for their  chief complaint at this time and likelihood of emergent condition existing is low. Their vital signs are stable. They have been counseled on decision, discharge, follow up and which symptoms necessitate immediate return to the emergency department.  They verbally stated understanding and agreement with plan and discharged in stable condition.      Marily MemosJason Matej Sappenfield, MD 07/17/15 [phone]

## 2015-07-17 NOTE — Telephone Encounter (Signed)
Pt. Called stating she went to the ED over the weekend and was taken out of her current medication. Pt. Stated she had to be seen today by her PCP. Pt. Has an appointment on 07/19/15 and she stated she could not wait that long. I told pt. That per her PCP she had to check her BP in the morning and the afternoon to see how her BP is without her medication. Pt. Said she checked it this morning at 8:00 and it was 122/84 and said she would call back at 3 p.m. With her afternoon BP.

## 2015-07-17 NOTE — Telephone Encounter (Signed)
Pt would like to speak with a nurse regarding BP meds that she was placed on and a tubal. Pt was advised that nursing staff was seeing patients and would call her back as soon as possible. Pt did verbalize ok to call her back after 5pm.

## 2015-07-18 ENCOUNTER — Telehealth: Payer: Self-pay | Admitting: Family Medicine

## 2015-07-18 NOTE — Telephone Encounter (Signed)
LM on VM to CB

## 2015-07-18 NOTE — Progress Notes (Signed)
Spoke with patient. Patient returned my call. Patient c/o that her BP varies btwn 103/68-121/70. The lowest it has dropped is 87/47 during hospital stay. She c/o having HA's, feeling dizzy with sharp in chest. I advised patient that i sent a message to Dr. Venetia NightAmao with her concerns. Patient states she has an appt tomorrow, 04/12 with Dr. Venetia NightAmao. I advise patient to return to the ED if her sxs persist. Patient verbalized understanding with no further questions.

## 2015-07-18 NOTE — Telephone Encounter (Signed)
Medical Assistant left message on patient's home and cell voicemail. Voicemail states to give a call back to [place] [facility] at [phone].  !!!Please inform patient of BP's being reviewed by provider. Patient may continue without BP medication!!!

## 2015-07-18 NOTE — Telephone Encounter (Signed)
CMA called patient, patient mailbox was full. Couldn't leave a message. Will try back later.

## 2015-07-18 NOTE — Telephone Encounter (Signed)
Patient stated that the hospital took her off her medicine, carvedilol. Patient would like advice on what to do. Patient stated that shes been experiencing low blood pressure.

## 2015-07-18 NOTE — Telephone Encounter (Signed)
She had spoken to a different front office staff regarding being taken off all her medications by the ED and I had instructed her to keep a log of her blood pressure and report back to the clinic which she did and her blood pressure was normal. She is advised to keep a log of all her blood pressures until her office visit. Please encourage her to apply for the orange card and [place] discount as this is holding up our getting [facility] involved in her care as well as telemetry monitoring in which her blood pressure will be monitored at home.

## 2015-07-19 ENCOUNTER — Encounter: Payer: Self-pay | Admitting: Family Medicine

## 2015-07-19 ENCOUNTER — Ambulatory Visit: Payer: Medicaid Other | Attending: Family Medicine | Admitting: Family Medicine

## 2015-07-19 VITALS — BP 127/86 | HR 75 | Temp 98.4°F | Ht 63.0 in | Wt 246.2 lb

## 2015-07-19 DIAGNOSIS — Z9114 Patient's other noncompliance with medication regimen: Secondary | ICD-10-CM | POA: Diagnosis not present

## 2015-07-19 DIAGNOSIS — I10 Essential (primary) hypertension: Secondary | ICD-10-CM | POA: Insufficient documentation

## 2015-07-19 DIAGNOSIS — R42 Dizziness and giddiness: Secondary | ICD-10-CM | POA: Insufficient documentation

## 2015-07-19 DIAGNOSIS — Z79899 Other long term (current) drug therapy: Secondary | ICD-10-CM | POA: Diagnosis not present

## 2015-07-19 DIAGNOSIS — N939 Abnormal uterine and vaginal bleeding, unspecified: Secondary | ICD-10-CM | POA: Insufficient documentation

## 2015-07-19 DIAGNOSIS — Z6841 Body Mass Index (BMI) 40.0 and over, adult: Secondary | ICD-10-CM | POA: Diagnosis not present

## 2015-07-19 DIAGNOSIS — R0982 Postnasal drip: Secondary | ICD-10-CM | POA: Insufficient documentation

## 2015-07-19 DIAGNOSIS — M940 Chondrocostal junction syndrome [Tietze]: Secondary | ICD-10-CM | POA: Diagnosis not present

## 2015-07-19 DIAGNOSIS — R079 Chest pain, unspecified: Secondary | ICD-10-CM | POA: Insufficient documentation

## 2015-07-19 MED ORDER — CETIRIZINE HCL 10 MG PO TABS: 10.0000 mg | ORAL_TABLET | Freq: Every day | ORAL | Status: DC

## 2015-07-19 MED FILL — ?CETIRIZINE HCL 10 MG TABLE: 10 | 30 days supply | Qty: 30 | Fill #0 | Status: TO

## 2015-07-19 NOTE — Progress Notes (Signed)
Subjective:  Patient ID: Crystal Burns, female    DOB: November 03, 1980  Age: 35 y.o. MRN: [id number]  CC: Follow-up   HPI Crystal Burns is a 35 year old female old female with a history of hypertension who comes in for follow-up of her blood pressure. She has had multiple ED visits (12 in the last 6 months) and multiple office visit for blood pressure concerns and has had changes in antihypertensive regimen due to complaints of elevated blood pressure and other times of low blood pressure. Her blood pressure has always been on the low side at her office visits but she has persistently emphasized that her blood pressure is high when she is at home. At her most recent ED visit she was taken off all antihypertensives due to a soft blood pressure and today she is concerned that she could have a stroke or heart attack from not taking her medications.  She continues to complain of dizziness despite being off all her antihypertensive. We had discussed setting up telemonitoring for her BP but she has no medical coverage or the [facility] discount and has been informed several times to apply for this. [facility] had agreed to see the patient under conditions that she would have the [facility] card.  She complains of sharp left-sided chest pains which she received naproxen for the ED but she is not taking at this time. Also complains of having a period for the last 2 weeks; she has been on Depakote since 03/2015 and received her last shot last month. He had a 6 week postpartum check last year after the birth of her child.  Outpatient Prescriptions Prior to Visit  Medication Sig Dispense Refill  . amoxicillin (AMOXIL) 500 MG tablet Take 1,000 mg by mouth 2 (two) times daily.     . ergocalciferol (VITAMIN D2) 50000 units capsule Take 1 capsule (50,000 Units total) by mouth once a week. (Patient taking differently: Take 50,000 Units by mouth once a week. Tuesday) 9 capsule 0  . ibuprofen (ADVIL,MOTRIN) 600 MG  tablet TAKE ONE TABLET EVERY 6 HOURS AS NEEDED FOR MILD PAIN 90 tablet 0  . medroxyPROGESTERone (DEPO-PROVERA) 150 MG/ML injection Inject 1 mL (150 mg total) into the muscle every 3 (three) months. 150 mL 3  . naproxen (NAPROSYN) 500 MG tablet Take 1 tablet (500 mg total) by mouth 2 (two) times daily. (Patient taking differently: Take 500 mg by mouth 2 (two) times daily as needed for moderate pain. ) 30 tablet 0  . acetaminophen (TYLENOL) 500 MG tablet Take 1,000 mg by mouth every 6 (six) hours as needed for moderate pain. Reported on 07/19/2015    . chlorhexidine gluconate (PERIDEX) 0.12 % solution Use as directed 15 mLs in the mouth or throat 2 (two) times daily. Swish for 30 seconds and spit twice daily after brushing (Patient not taking: Reported on 07/19/2015) 118 mL 0  . losartan (COZAAR) 25 MG tablet Take 1 tablet (25 mg total) by mouth daily. (Patient not taking: Reported on 07/19/2015) 30 tablet 2   No facility-administered medications prior to visit.    ROS Review of Systems  Constitutional: Negative for activity change, appetite change and fatigue.  HENT: Positive for postnasal drip. Negative for congestion, sinus pressure and sore throat.   Eyes: Negative for visual disturbance.  Respiratory: Positive for shortness of breath (worse at night when sleeping). Negative for cough, chest tightness and wheezing.   Cardiovascular: Positive for chest pain (sharp left sided chest pains). Negative  for palpitations.  Gastrointestinal: Negative for abdominal pain, constipation and abdominal distention.  Endocrine: Negative for polydipsia.  Genitourinary: Positive for menstrual problem. Negative for dysuria and frequency.  Musculoskeletal: Negative for back pain and arthralgias.  Skin: Negative for rash.  Neurological: Positive for light-headedness. Negative for tremors and numbness.  Hematological: Does not bruise/bleed easily.  Psychiatric/Behavioral: Negative for behavioral problems and  agitation.    Objective:  BP 127/86 mmHg  Pulse 75  Temp(Src) 98.4 F (36.9 C) (Oral)  Ht 5\' 3"  (1.6 m)  Wt 246 lb 3.2 oz (111.676 kg)  BMI 43.62 kg/m2  SpO2 99%  LMP 07/05/2015  BP/Weight 07/19/2015 07/16/2015 07/15/2015  Systolic BP 127 122 109  Diastolic BP 86 66 55  Wt. (Lbs) 246.2 244.2 247  BMI 43.62 43.27 43.76      Physical Exam  Constitutional: She is oriented to person, place, and time. She appears well-developed and well-nourished.  Cardiovascular: Normal rate, normal heart sounds and intact distal pulses.   No murmur heard. Pulmonary/Chest: Effort normal and breath sounds normal. She has no wheezes. She has no rales. She exhibits tenderness (on left chestwall on deep palpation).  Abdominal: Soft. Bowel sounds are normal. She exhibits no distension and no mass. There is no tenderness.  Musculoskeletal: Normal range of motion.  Neurological: She is alert and oriented to person, place, and time.     Assessment & Plan:   1. Essential hypertension Blood pressure is controlled off antihypertensives Continue to hold antihypertensive and keep blood pressure log. We'll see back again in 2 weeks and review log. Emphasized the need to apply for the [facility] discount/[place] card so we can get [facility] to see her and coordinate telemonitoring  2. Post-nasal drip - cetirizine (ZYRTEC) 10 MG tablet; Take 1 tablet (10 mg total) by mouth daily.  Dispense: 60 tablet; Refill: 2  3. Costochondritis This could explain her sharp chest pains however she is noncompliant with medications and has not been taking naproxen  4. Morbid obesity due to excess calories [facility](HCC) She has been working on an active lifestyle to lose weight. Symptoms of shortness of breath point to sleep apnea however I am unable to refer her for a sleep study due to lack of [place] discount.  5. Abnormal vaginal bleeding Currently on Depo Provera which she started 4 months ago and her last shot was a month  ago. We'll observe for now; if it persists we'll consider oral contraceptive pills.  6. Dizziness She is still symptomatic despite blood pressure being on hold. Had refused initiation of meclizine at a previous visit. If symptoms persist at next visit I will add meclizine to her regimen.  Meds ordered this encounter  Medications  . cetirizine (ZYRTEC) 10 MG tablet    Sig: Take 1 tablet (10 mg total) by mouth daily.    Dispense:  60 tablet    Refill:  2    Follow-up: Return in about 2 weeks (around 08/02/2015) for follow up on Hypertension and dizziness.   Jaclyn ShaggyEnobong Amao MD

## 2015-07-19 NOTE — Progress Notes (Signed)
Patient here for follow up for blood pressure.  Patient reports going to the hospital every week for low blood pressure, shortness of breath, dizziness.  Patient reports going in last week for chest pain which she was told was inflamation and given Naproxen.  Patient reports pain shooting/sharp that do not last long, but is unaffected by rest.  Patient reports a cough which she thinks could be attributed to pollen.  Patient reports cough is productive and and some shortness of breath mainly at night.     Patient states she is not currently having chest pain.  But patient reports feeling "woozy" right now.  Patient reports pain in left lower abdomen when she moves in a certain way, intermittent, described as uncomfortable.  Patient wants to know if she should take the Naproxen on a regular basis that was prescribed to her in the ED

## 2015-07-19 NOTE — Patient Instructions (Signed)

## 2015-07-20 ENCOUNTER — Telehealth: Payer: Self-pay | Admitting: Family Medicine

## 2015-07-20 NOTE — Telephone Encounter (Signed)
Left a message for the patient to return my call. 2nd attempt to reach patient.

## 2015-07-20 NOTE — Telephone Encounter (Signed)
Patient returned phone call, please f/u with pt.    °

## 2015-07-24 ENCOUNTER — Telehealth: Payer: Self-pay | Admitting: Clinical

## 2015-07-24 NOTE — Telephone Encounter (Signed)
Pt. Called stating that her BP has been going up and down. Pt. Stated the highest it has been 155/100. She stated she has headaches. Please f/u with pt.

## 2015-07-24 NOTE — Telephone Encounter (Signed)
Pt. Called stating that her BP has been going up and down. Pt. Stated the highest it has been 155/100. She stated she has headaches. Please f/u with pt.  °

## 2015-07-24 NOTE — Telephone Encounter (Signed)
Attempt to f/u with pt; left HIPPA-compliant message to return call to Jamie from [employer] at [phone] 

## 2015-07-24 NOTE — Telephone Encounter (Signed)
Pt is requesting advice about whether or not she should continue to take her blood pressure medication, as she was taken off in the ED. She says that it hasn't gotten "high-high", but that it has gone as high as 150/100 both Friday and Saturday. Pt says to please leave any advice on her voicemail, as she cannot make calls at work, she is on a quick break right now. Please f/u with pt.

## 2015-07-25 ENCOUNTER — Encounter (HOSPITAL_COMMUNITY): Payer: Self-pay

## 2015-07-25 DIAGNOSIS — E669 Obesity, unspecified: Secondary | ICD-10-CM | POA: Diagnosis not present

## 2015-07-25 DIAGNOSIS — I10 Essential (primary) hypertension: Secondary | ICD-10-CM | POA: Diagnosis present

## 2015-07-25 DIAGNOSIS — J45909 Unspecified asthma, uncomplicated: Secondary | ICD-10-CM | POA: Diagnosis not present

## 2015-07-25 NOTE — Telephone Encounter (Signed)
We discussed this extensively at her last office visit and I would like her to adhere to the plan at her last visit until she sees me and also to obtain the [facility] card so we can set up home BP monitoring services for her.

## 2015-07-25 NOTE — ED Notes (Signed)
Pt seen in ED several weeks ago for lightheadedness and low BP, was taken off BP med.  Pt followed up with PCP who recommended to stay off BP meds and keep track of BP.  Pt checking BP every 2-3 hours and has noticed BP higher at night.  Tonight BP 177/109.  Pt is very anxious about her BP readings, being on no BP meds and scared of having a stroke.

## 2015-07-25 NOTE — Telephone Encounter (Signed)
Returning call. Patient mailbox is full. Couldn't leave a message for the patient.   Note to patient:  Please advise patient to go to the Urgent care if her migraine persist.

## 2015-07-26 ENCOUNTER — Ambulatory Visit (HOSPITAL_COMMUNITY)
Admission: EM | Admit: 2015-07-26 | Discharge: 2015-07-26 | Disposition: A | Discharge status: Home / Self Care | Payer: Self-pay | Attending: Family Medicine | Admitting: Family Medicine

## 2015-07-26 ENCOUNTER — Telehealth: Payer: Self-pay | Admitting: Family Medicine

## 2015-07-26 ENCOUNTER — Telehealth: Payer: Self-pay | Admitting: Clinical

## 2015-07-26 ENCOUNTER — Encounter (HOSPITAL_COMMUNITY): Payer: Self-pay | Admitting: Emergency Medicine

## 2015-07-26 ENCOUNTER — Emergency Department (HOSPITAL_COMMUNITY)
Admission: EM | Admit: 2015-07-26 | Discharge: 2015-07-26 | Disposition: A | Discharge status: Home / Self Care | Payer: Medicaid Other | Attending: Emergency Medicine | Admitting: Emergency Medicine

## 2015-07-26 DIAGNOSIS — J45909 Unspecified asthma, uncomplicated: Secondary | ICD-10-CM

## 2015-07-26 MED ORDER — ALBUTEROL SULFATE (2.5 MG/3ML) 0.083% IN NEBU
5.0000 mg | INHALATION_SOLUTION | Freq: Once | RESPIRATORY_TRACT | Status: AC
  Administered 2015-07-26: 5 mg via RESPIRATORY_TRACT

## 2015-07-26 MED ORDER — ALBUTEROL SULFATE (2.5 MG/3ML) 0.083% IN NEBU
INHALATION_SOLUTION | RESPIRATORY_TRACT | Status: AC
  Filled 2015-07-26: qty 6

## 2015-07-26 MED ORDER — PREDNISONE 50 MG PO TABS: ORAL_TABLET | ORAL | Status: DC

## 2015-07-26 MED ORDER — METHYLPREDNISOLONE SODIUM SUCC 125 MG IJ SOLR
125.0000 mg | Freq: Once | INTRAMUSCULAR | Status: AC
  Administered 2015-07-26: 125 mg via INTRAMUSCULAR

## 2015-07-26 MED ORDER — METHYLPREDNISOLONE SODIUM SUCC 125 MG IJ SOLR
INTRAMUSCULAR | Status: AC
  Filled 2015-07-26: qty 2

## 2015-07-26 MED ORDER — AZITHROMYCIN 250 MG PO TABS: ORAL_TABLET | ORAL | Status: DC

## 2015-07-26 MED ORDER — IPRATROPIUM BROMIDE 0.02 % IN SOLN
RESPIRATORY_TRACT | Status: AC
  Filled 2015-07-26: qty 2.5

## 2015-07-26 MED ORDER — SODIUM CHLORIDE 0.9 % IN NEBU
INHALATION_SOLUTION | RESPIRATORY_TRACT | Status: AC
  Filled 2015-07-26: qty 3

## 2015-07-26 MED ORDER — ALBUTEROL SULFATE HFA 108 (90 BASE) MCG/ACT IN AERS: 2.0000 | INHALATION_SPRAY | Freq: Four times a day (QID) | RESPIRATORY_TRACT | Status: DC | PRN

## 2015-07-26 MED ORDER — IPRATROPIUM BROMIDE 0.02 % IN SOLN
0.5000 mg | Freq: Once | RESPIRATORY_TRACT | Status: AC
  Administered 2015-07-26: 0.5 mg via RESPIRATORY_TRACT

## 2015-07-26 NOTE — Telephone Encounter (Signed)
Pt. Called stating she went to the ED last night because her BP was high. Pt. Stated she does not know what do do and really needs to speak with the nurse. Please f/u with pt.

## 2015-07-26 NOTE — Telephone Encounter (Signed)
Spoke with patient, patient verified name and DOB. Patient was urged to go to the Urgent care with the variation in her BP levels. Patient BP range from 119/86 to 189/106.  Patient was taken off her BP meds due to low BP reading.  Patient agreed to go to the Urgent care tonight.

## 2015-07-26 NOTE — ED Notes (Signed)
Patient reports bronchitis, chest soreness, wheezing.  Patient is concerned for blood pressure.  Reports emergency department took her off blood pressure medicine (4/9) because blood pressure has been running too low.  As of Friday, blood pressure started going up in the evening.  Patient went to the ed last night, but could not wait 6 hours to be seen last night.  Patient did take blood pressure medicine last night .

## 2015-07-26 NOTE — ED Provider Notes (Signed)
CSN: [id number]     Arrival date & time 07/26/15  1847 History   First MD Initiated Contact with Patient 07/26/15 2019     Chief Complaint  Patient presents with  . Bronchitis  . URI   (Consider location/radiation/quality/duration/timing/severity/associated sxs/prior Treatment) Patient is a 35 y.o. female presenting with URI. The history is provided by the patient.  URI Presenting symptoms: congestion and cough   Presenting symptoms: no fever   Severity:  Mild Onset quality:  Gradual Duration:  2 days Chronicity:  New Relieved by:  None tried Worsened by:  Nothing tried Ineffective treatments:  None tried Associated symptoms: sneezing and wheezing     Past Medical History  Diagnosis Date  . History of gonorrhea   . Gestational diabetes     2012  . Hypertension     2004  . Asthma     2010-only bothers pt during bad cold  . Depression     2010-had when mother passed away  . Pregnancy induced hypertension   . Obesity    Past Surgical History  Procedure Laterality Date  . Dilation and curettage of uterus    . Cholecystectomy    . Wisdom tooth extraction     Family History  Problem Relation Age of Onset  . Hypertension Mother   . Diabetes Mother   . Cancer Mother   . Hypertension Father   . Diabetes Father   . Hyperlipidemia Father   . Arthritis    . Asthma    . Breast cancer    . Heart failure    . Congenital heart disease    . Depression    . Heart attack     Social History  Substance Use Topics  . Smoking status: Former Smoker    Types: Cigarettes    Quit date: 10/31/2010  . Smokeless tobacco: Never Used  . Alcohol Use: No   OB History    Gravida Para Term Preterm AB TAB SAB Ectopic Multiple Living   11 7 7  0 4 0 4 0 0 6     Review of Systems  Constitutional: Negative.  Negative for fever.  HENT: Positive for congestion, postnasal drip and sneezing.   Respiratory: Positive for cough and wheezing.   Cardiovascular: Negative.   All other  systems reviewed and are negative.   Allergies  Bee venom; Lisinopril; and Shellfish allergy  Home Medications   Prior to Admission medications   Medication Sig Start Date End Date Taking? Authorizing Provider  acetaminophen (TYLENOL) 500 MG tablet Take 1,000 mg by mouth every 6 (six) hours as needed for moderate pain. Reported on 07/19/2015    Historical Provider, MD  albuterol (PROVENTIL HFA;VENTOLIN HFA) 108 (90 Base) MCG/ACT inhaler Inhale 2 puffs into the lungs every 6 (six) hours as needed for wheezing or shortness of breath. 07/26/15   Linna HoffJames D Miriam Kestler, MD  amoxicillin (AMOXIL) 500 MG tablet Take 1,000 mg by mouth 2 (two) times daily.     Historical Provider, MD  azithromycin (ZITHROMAX Z-PAK) 250 MG tablet Take as directed on pack 07/26/15   Linna HoffJames D Dalynn Jhaveri, MD  cetirizine (ZYRTEC) 10 MG tablet Take 1 tablet (10 mg total) by mouth daily. 07/19/15   Jaclyn ShaggyEnobong Amao, MD  chlorhexidine gluconate (PERIDEX) 0.12 % solution Use as directed 15 mLs in the mouth or throat 2 (two) times daily. Swish for 30 seconds and spit twice daily after brushing Patient not taking: Reported on 07/19/2015 06/12/15   Dessa PhiJosalyn Funches, MD  ergocalciferol (  VITAMIN D2) 50000 units capsule Take 1 capsule (50,000 Units total) by mouth once a week. Patient taking differently: Take 50,000 Units by mouth once a week. Tuesday 07/10/15   Jaclyn Shaggy, MD  ibuprofen (ADVIL,MOTRIN) 600 MG tablet TAKE ONE TABLET EVERY 6 HOURS AS NEEDED FOR MILD PAIN 07/07/15   Brock Bad, MD  losartan (COZAAR) 25 MG tablet Take 1 tablet (25 mg total) by mouth daily. Patient not taking: Reported on 07/19/2015 07/07/15   Jaclyn Shaggy, MD  medroxyPROGESTERone (DEPO-PROVERA) 150 MG/ML injection Inject 1 mL (150 mg total) into the muscle every 3 (three) months. 02/27/15   Rachelle A Denney, CNM  naproxen (NAPROSYN) 500 MG tablet Take 1 tablet (500 mg total) by mouth 2 (two) times daily. Patient taking differently: Take 500 mg by mouth 2 (two) times daily as  needed for moderate pain.  07/12/15   Rolland Porter, MD  predniSONE (DELTASONE) 50 MG tablet 1 tab daily for 2 days then 1/2 tab daily for 2 days. 07/26/15   Linna Hoff, MD   Meds Ordered and Administered this Visit   Medications  albuterol (PROVENTIL) (2.5 MG/3ML) 0.083% nebulizer solution 5 mg (5 mg Nebulization Given 07/26/15 2042)  ipratropium (ATROVENT) nebulizer solution 0.5 mg (0.5 mg Nebulization Given 07/26/15 2042)  methylPREDNISolone sodium succinate (SOLU-MEDROL) 125 mg/2 mL injection 125 mg (125 mg Intramuscular Given 07/26/15 2042)    BP 116/67 mmHg  Pulse 79  Temp(Src) 98.5 F (36.9 C) (Oral)  Resp 20  SpO2 97%  LMP 07/05/2015 No data found.   Physical Exam  Constitutional: She is oriented to person, place, and time. She appears well-developed and well-nourished. She appears distressed.  HENT:  Right Ear: External ear normal.  Left Ear: External ear normal.  Mouth/Throat: Oropharynx is clear and moist.  Neck: Normal range of motion. Neck supple.  Cardiovascular: Normal rate, regular rhythm, normal heart sounds and intact distal pulses.   Pulmonary/Chest: Effort normal. She has wheezes. She has no rales. She exhibits no tenderness.  Lymphadenopathy:    She has no cervical adenopathy.  Neurological: She is alert and oriented to person, place, and time.  Skin: Skin is warm and dry.  Nursing note and vitals reviewed.   ED Course  Procedures (including critical care time)  Labs Review Labs Reviewed - No data to display  Imaging Review No results found.   Visual Acuity Review  Right Eye Distance:   Left Eye Distance:   Bilateral Distance:    Right Eye Near:   Left Eye Near:    Bilateral Near:         MDM   1. Asthma due to seasonal allergies     Lungs clear at time of d/c after meds.   Linna Hoff, MD 07/29/15 2038

## 2015-07-26 NOTE — Telephone Encounter (Signed)
Patient called stating that her blood pressure keeps going up and she would like to speak to nurse. Please f/u with pt.

## 2015-07-26 NOTE — ED Notes (Signed)
Pt stated that she was going to go home cause she felt better. Pt stated she would return if she felt worse

## 2015-07-28 ENCOUNTER — Telehealth: Payer: Self-pay | Admitting: Family Medicine

## 2015-07-28 MED FILL — predniSONE 10 MG TABS: 10 | 4 days supply | Qty: 15 | Fill #0

## 2015-07-28 MED FILL — !VENTOLIN HFA INHALER: 108 (90 BAS | 28 days supply | Qty: 18 | Fill #0

## 2015-07-28 MED FILL — ?AZITHROMYCIN 250 MG TABLET: 250 MG | 5 days supply | Qty: 6 | Fill #0

## 2015-07-28 NOTE — Telephone Encounter (Signed)
Patient came in wanting advice to control blood pressure. Patient states blood pressure increases at night

## 2015-07-31 NOTE — Telephone Encounter (Signed)
Please advise her to keep her blood pressure log with blood pressures taken in the morning, afternoon and evening when she is calm and sitting and I will review that at her next office visit and make regimen changes based on that as she previously complained of dizziness and low blood pressure for which antihypertensives were stopped at the emergency room.

## 2015-07-31 NOTE — Telephone Encounter (Addendum)
Called patient, patient did not answer. Left message for patient to call the clinic.   Note to patient:  Please advise her to keep her blood pressure log with blood pressures taken in the morning, afternoon and evening when she is calm and sitting and I will review that at her next office visit and make regimen changes based on that as she previously complained of dizziness and low blood pressure for which antihypertensives were stopped at the emergency room.

## 2015-07-31 NOTE — Telephone Encounter (Signed)
Error

## 2015-07-31 NOTE — Telephone Encounter (Signed)
Patient states her bp increases some nights..theophylline highest 177/109...she wants to know what she needs to do to control her blood pressure....please follow up with patient.Crystal Burns.Crystal Burns..Crystal Burns

## 2015-08-03 ENCOUNTER — Ambulatory Visit: Payer: Medicaid Other | Attending: Family Medicine | Admitting: Family Medicine

## 2015-08-03 ENCOUNTER — Encounter: Payer: Self-pay | Admitting: Family Medicine

## 2015-08-03 VITALS — BP 125/85 | HR 85 | Temp 97.8°F | Resp 18 | Ht 63.0 in | Wt 241.6 lb

## 2015-08-03 DIAGNOSIS — Z9114 Patient's other noncompliance with medication regimen: Secondary | ICD-10-CM | POA: Insufficient documentation

## 2015-08-03 DIAGNOSIS — I10 Essential (primary) hypertension: Secondary | ICD-10-CM

## 2015-08-03 DIAGNOSIS — M940 Chondrocostal junction syndrome [Tietze]: Secondary | ICD-10-CM

## 2015-08-03 DIAGNOSIS — F419 Anxiety disorder, unspecified: Secondary | ICD-10-CM | POA: Diagnosis not present

## 2015-08-03 DIAGNOSIS — R42 Dizziness and giddiness: Secondary | ICD-10-CM | POA: Diagnosis not present

## 2015-08-03 DIAGNOSIS — Z79899 Other long term (current) drug therapy: Secondary | ICD-10-CM | POA: Insufficient documentation

## 2015-08-03 DIAGNOSIS — Z87891 Personal history of nicotine dependence: Secondary | ICD-10-CM | POA: Diagnosis not present

## 2015-08-03 DIAGNOSIS — R079 Chest pain, unspecified: Secondary | ICD-10-CM | POA: Diagnosis not present

## 2015-08-03 DIAGNOSIS — F411 Generalized anxiety disorder: Secondary | ICD-10-CM

## 2015-08-03 MED ORDER — HYDROXYZINE PAMOATE 25 MG PO CAPS: 25.0000 mg | ORAL_CAPSULE | Freq: Every day | ORAL | Status: DC

## 2015-08-03 NOTE — Patient Instructions (Signed)
Hypertension Hypertension, commonly called high blood pressure, is when the force of blood pumping through your arteries is too strong. Your arteries are the blood vessels that carry blood from your heart throughout your body. A blood pressure reading consists of a higher number over a lower number, such as 110/72. The higher number (systolic) is the pressure inside your arteries when your heart pumps. The lower number (diastolic) is the pressure inside your arteries when your heart relaxes. Ideally you want your blood pressure below 120/80. Hypertension forces your heart to work harder to pump blood. Your arteries may become narrow or stiff. Having untreated or uncontrolled hypertension can cause heart attack, stroke, kidney disease, and other problems. RISK FACTORS Some risk factors for high blood pressure are controllable. Others are not.  Risk factors you cannot control include:   Race. You may be at higher risk if you are African American.  Age. Risk increases with age.  Gender. Men are at higher risk than women before age 45 years. After age 65, women are at higher risk than men. Risk factors you can control include:  Not getting enough exercise or physical activity.  Being overweight.  Getting too much fat, sugar, calories, or salt in your diet.  Drinking too much alcohol. SIGNS AND SYMPTOMS Hypertension does not usually cause signs or symptoms. Extremely high blood pressure (hypertensive crisis) may cause headache, anxiety, shortness of breath, and nosebleed. DIAGNOSIS To check if you have hypertension, your health care provider will measure your blood pressure while you are seated, with your arm held at the level of your heart. It should be measured at least twice using the same arm. Certain conditions can cause a difference in blood pressure between your right and left arms. A blood pressure reading that is higher than normal on one occasion does not mean that you need treatment. If  it is not clear whether you have high blood pressure, you may be asked to return on a different day to have your blood pressure checked again. Or, you may be asked to monitor your blood pressure at home for 1 or more weeks. TREATMENT Treating high blood pressure includes making lifestyle changes and possibly taking medicine. Living a healthy lifestyle can help lower high blood pressure. You may need to change some of your habits. Lifestyle changes may include:  Following the DASH diet. This diet is high in fruits, vegetables, and whole grains. It is low in salt, red meat, and added sugars.  Keep your sodium intake below 2,300 mg per day.  Getting at least 30-45 minutes of aerobic exercise at least 4 times per week.  Losing weight if necessary.  Not smoking.  Limiting alcoholic beverages.  Learning ways to reduce stress. Your health care provider may prescribe medicine if lifestyle changes are not enough to get your blood pressure under control, and if one of the following is true:  You are 18-59 years of age and your systolic blood pressure is above 140.  You are 60 years of age or older, and your systolic blood pressure is above 150.  Your diastolic blood pressure is above 90.  You have diabetes, and your systolic blood pressure is over 140 or your diastolic blood pressure is over 90.  You have kidney disease and your blood pressure is above 140/90.  You have heart disease and your blood pressure is above 140/90. Your personal target blood pressure may vary depending on your medical conditions, your age, and other factors. HOME CARE INSTRUCTIONS    Have your blood pressure rechecked as directed by your health care provider.   Take medicines only as directed by your health care provider. Follow the directions carefully. Blood pressure medicines must be taken as prescribed. The medicine does not work as well when you skip doses. Skipping doses also puts you at risk for  problems.  Do not smoke.   Monitor your blood pressure at home as directed by your health care provider. SEEK MEDICAL CARE IF:   You think you are having a reaction to medicines taken.  You have recurrent headaches or feel dizzy.  You have swelling in your ankles.  You have trouble with your vision. SEEK IMMEDIATE MEDICAL CARE IF:  You develop a severe headache or confusion.  You have unusual weakness, numbness, or feel faint.  You have severe chest or abdominal pain.  You vomit repeatedly.  You have trouble breathing. MAKE SURE YOU:   Understand these instructions.  Will watch your condition.  Will get help right away if you are not doing well or get worse.   This information is not intended to replace advice given to you by your health care provider. Make sure you discuss any questions you have with your health care provider.   Document Released: 03/25/2005 Document Revised: 08/09/2014 Document Reviewed: 01/15/2013 Elsevier Interactive Patient Education 2016 Elsevier Inc.  

## 2015-08-03 NOTE — Progress Notes (Signed)
Patient's here for HTN and dizziness.

## 2015-08-03 NOTE — Progress Notes (Signed)
Subjective:    Patient ID: Crystal Burns, female    DOB: 06-28-1980, 35 y.o.   MRN: [id number]  HPI She is a 35 year old female old female with a history of hypertension who comes in for follow-up of her blood pressure. She has had multiple ED visits (13 in the last 6 months) and multiple office visit for blood pressure concerns and has had changes in antihypertensive regimen due to complaints of elevated blood pressure and other times of low blood pressure. Her blood pressure has always been on the low side at her office visits but she has persistently emphasized that her blood pressure is high when she is at home and is worse at night.  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 It is on those occassions that her blood pressure is usually in the 160-170 range when she checks it. She keeps a blood pressure cuff in her car and takes it with her when she goes for social events and at restaurants and also checks her blood pressure at work and it is usually elevated at work. She is concerned that she could have a stroke or heart attack from not taking her medications and would like to see a cardiologist.  Past Medical History  Diagnosis Date  . History of gonorrhea   . Gestational diabetes     2012  . Hypertension     2004  . Asthma     2010-only bothers pt during bad cold  . Depression     2010-had when mother passed away  . Pregnancy induced hypertension   . Obesity     Past Surgical History  Procedure Laterality Date  . Dilation and curettage of uterus    . Cholecystectomy    . Wisdom tooth extraction      Social History   Social History  . Marital Status: Divorced    Spouse Name: N/A  . Number of Children: N/A  . Years of Education: N/A   Occupational History  . Not on  file.   Social History Main Topics  . Smoking status: Former Smoker    Types: Cigarettes    Quit date: 10/31/2010  . Smokeless tobacco: Never Used  . Alcohol Use: No  . Drug Use: No  . Sexual Activity:    Partners: Male   Other Topics Concern  . Not on file   Social History Narrative    Current Outpatient Prescriptions on File Prior to Visit  Medication Sig Dispense Refill  . acetaminophen (TYLENOL) 500 MG tablet Take 1,000 mg by mouth every 6 (six) hours as needed for moderate pain. Reported on 07/19/2015    . albuterol (PROVENTIL HFA;VENTOLIN HFA) 108 (90 Base) MCG/ACT inhaler Inhale 2 puffs into the lungs every 6 (six) hours as needed for wheezing or shortness of breath. 1 Inhaler 0  . cetirizine (ZYRTEC) 10 MG tablet Take 1 tablet (10 mg total) by mouth daily. 60 tablet 2  . ibuprofen (ADVIL,MOTRIN) 600 MG tablet TAKE ONE TABLET EVERY 6 HOURS AS NEEDED FOR MILD PAIN 90 tablet 0  . medroxyPROGESTERone (DEPO-PROVERA) 150 MG/ML injection Inject 1 mL (150 mg total) into the muscle every 3 (three) months. 150 mL 3  . chlorhexidine gluconate (PERIDEX) 0.12 % solution Use as directed 15  mLs in the mouth or throat 2 (two) times daily. Swish for 30 seconds and spit twice daily after brushing (Patient not taking: Reported on 07/19/2015) 118 mL 0  . ergocalciferol (VITAMIN D2) 50000 units capsule Take 1 capsule (50,000 Units total) by mouth once a week. (Patient not taking: Reported on 08/03/2015) 9 capsule 0   No current facility-administered medications on file prior to visit.     Review of Systems Constitutional: Negative for activity change, appetite change and fatigue.  HENT: Negative for congestion, sinus pressure and sore throat.   Eyes: Negative for visual disturbance.  Respiratory: Positive for Negative for cough, chest tightness and wheezing.   Cardiovascular: Positive for chest pain (sharp left sided chest pains). Negative for palpitations.  Gastrointestinal: Negative for  abdominal pain, constipation and abdominal distention.  Endocrine: Negative for polydipsia.  Genitourinary: Negative for dysuria and frequency.  Musculoskeletal: Negative for back pain and arthralgias.  Skin: Negative for rash.  Neurological: Positive for light-headedness. Negative for tremors and numbness.  Hematological: Does not bruise/bleed easily.  Psychiatric/Behavioral: Positive for anxiety      Objective: Filed Vitals:   08/03/15 1216  BP: 125/85  Pulse: 85  Temp: 97.8 F (36.6 C)  TempSrc: Oral  Resp: 18  Height:  (1.6 m)  Weight: 241 lb 9.6 oz (109.589 kg)  SpO2: 99%      Physical Exam  Constitutional: She is oriented to person, place, and time. She appears well-developed and well-nourished.  Cardiovascular: Normal rate, normal heart sounds and intact distal pulses.   No murmur heard. Pulmonary/Chest: Effort normal and breath sounds normal. She has no wheezes. She has no rales. She exhibits tenderness (on left chestwall on deep palpation).  Abdominal: Soft. Bowel sounds are normal. She exhibits no distension and no mass. There is no tenderness.  Musculoskeletal: Normal range of motion.  Neurological: She is alert and oriented to person, place, and time.  Psychiatry: Anxious     Assessment & Plan:  1. Essential hypertension Blood pressure is controlled off antihypertensives Discontinue antihypertensive and keep blood pressure log. We'll see back again in 2 weeks and review log. Emphasized the need to apply for the [facility] discount/[place] card so we can get [facility] to see her and coordinate telemonitoring   2. Costochondritis This could explain her sharp chest pains however she is noncompliant with medications and has not been taking naproxen She is persuaded she needs to see a cardiologist and so I have informed her I would refer her to one once [facility] discount has been processed  3. Morbid obesity due to excess calories [facility]) She has been working  on an active lifestyle to lose weight.  4. Dizziness She is still symptomatic despite blood pressure being on hold. Had refused initiation of meclizine at a previous visit.  5. Anxiety This could be contributing to occasionally elevated blood pressure in the evenings especially as that coincides with when she returns from work (which is a high stress job) and coming to meet her 6 kids at home. Placed on hydroxyzine which she will take only the evenings due to sedating side effects. Has seen LCSW in the past and denied being stressed at the time.  Total time spent in this encounter was 30 minutes and greater than half of the time spent reassuring the patient, counseling on diagnosis, answering questions and discussing management options.

## 2015-08-08 ENCOUNTER — Ambulatory Visit: Payer: Self-pay

## 2015-08-15 ENCOUNTER — Telehealth: Payer: Self-pay | Admitting: Family Medicine

## 2015-08-15 NOTE — Telephone Encounter (Signed)
Pt. Called requesting a refill on amoxicillin (AMOXIL) 500 MG capsule because her tooth hurts. Please f/u with pt.

## 2015-08-16 ENCOUNTER — Ambulatory Visit: Payer: Self-pay

## 2015-08-16 ENCOUNTER — Telehealth: Payer: Self-pay | Admitting: *Deleted

## 2015-08-16 NOTE — Telephone Encounter (Signed)
Patient states she needs a referral letter from Dr Clearance CootsHarper to the women's clinic at the hospital [facility] to get her BTL done there. Can we please do that for her?

## 2015-08-16 NOTE — Telephone Encounter (Signed)
Placed call to patient, patient did not answer. Message left for patient to return my call.

## 2015-08-18 ENCOUNTER — Ambulatory Visit: Payer: Medicaid Other | Attending: Family Medicine | Admitting: Family Medicine

## 2015-08-18 ENCOUNTER — Encounter: Payer: Self-pay | Admitting: Family Medicine

## 2015-08-18 VITALS — BP 135/79 | HR 76 | Temp 98.3°F | Resp 18 | Ht 63.0 in | Wt 245.4 lb

## 2015-08-18 DIAGNOSIS — I10 Essential (primary) hypertension: Secondary | ICD-10-CM | POA: Insufficient documentation

## 2015-08-18 DIAGNOSIS — K0889 Other specified disorders of teeth and supporting structures: Secondary | ICD-10-CM | POA: Diagnosis not present

## 2015-08-18 DIAGNOSIS — Z79899 Other long term (current) drug therapy: Secondary | ICD-10-CM | POA: Diagnosis not present

## 2015-08-18 DIAGNOSIS — E669 Obesity, unspecified: Secondary | ICD-10-CM | POA: Insufficient documentation

## 2015-08-18 DIAGNOSIS — E559 Vitamin D deficiency, unspecified: Secondary | ICD-10-CM | POA: Insufficient documentation

## 2015-08-18 MED ORDER — ERGOCALCIFEROL 1.25 MG (50000 UT) PO CAPS: 50000.0000 [IU] | ORAL_CAPSULE | ORAL | Status: DC

## 2015-08-18 MED FILL — VIT D2 1.25 MG (50,000 UNIT: 1.25 MG | 28 days supply | Qty: 4 | Fill #1

## 2015-08-18 NOTE — Progress Notes (Signed)
Patient's here for HTN. Patient reports feeling lightheaded.  Patient states that her wisdom tooth on her R side. Pain rated 5/10.  Patient denies feeling dizzy, but feels fatigue.  Patient requesting refill.

## 2015-08-18 NOTE — Progress Notes (Signed)
Subjective:  Patient ID: Crystal Burns, female    DOB: 16-Aug-1980  Age: 35 y.o. MRN: [id number]  CC: Hypertension; Dizziness; and Dental Pain   HPI Crystal Burns is a 35 year old female with a history of HTN here for a follow up visit. She has been off her anti hypertensives due to episodes of hypotension associated with dizziness and reports home blood pressures have been under 140/90. Complains of tooth ache today and is requesting a refill of her antibiotics which she completed 2 weeks ago. She is working on seeing a Education officer, communitydentist. Continues to have intermittent chest pains which was previously diagnosed as costochondritis for which she takes Ibuprofen and is fatigued often- she has no help with her kids at this time.  Her medicaid has now been approved.  Outpatient Prescriptions Prior to Visit  Medication Sig Dispense Refill  . acetaminophen (TYLENOL) 500 MG tablet Take 1,000 mg by mouth every 6 (six) hours as needed for moderate pain. Reported on 07/19/2015    . albuterol (PROVENTIL HFA;VENTOLIN HFA) 108 (90 Base) MCG/ACT inhaler Inhale 2 puffs into the lungs every 6 (six) hours as needed for wheezing or shortness of breath. 1 Inhaler 0  . ibuprofen (ADVIL,MOTRIN) 600 MG tablet TAKE ONE TABLET EVERY 6 HOURS AS NEEDED FOR MILD PAIN 90 tablet 0  . medroxyPROGESTERone (DEPO-PROVERA) 150 MG/ML injection Inject 1 mL (150 mg total) into the muscle every 3 (three) months. 150 mL 3  . cetirizine (ZYRTEC) 10 MG tablet Take 1 tablet (10 mg total) by mouth daily. 60 tablet 2  . hydrOXYzine (VISTARIL) 25 MG capsule Take 1 capsule (25 mg total) by mouth daily with supper. 30 capsule 1  . chlorhexidine gluconate (PERIDEX) 0.12 % solution Use as directed 15 mLs in the mouth or throat 2 (two) times daily. Swish for 30 seconds and spit twice daily after brushing (Patient not taking: Reported on 07/19/2015) 118 mL 0  . ergocalciferol (VITAMIN D2) 50000 units capsule Take 1 capsule (50,000 Units total)  by mouth once a week. (Patient not taking: Reported on 08/03/2015) 9 capsule 0   No facility-administered medications prior to visit.    ROS Review of Systems  Constitutional: Negative for activity change and appetite change.  HENT: Positive for dental problem. Negative for sinus pressure and sore throat.   Respiratory: Negative for chest tightness, shortness of breath and wheezing.   Cardiovascular: Positive for chest pain. Negative for palpitations.  Gastrointestinal: Negative for abdominal pain, constipation and abdominal distention.  Genitourinary: Negative.   Musculoskeletal: Negative.   Psychiatric/Behavioral: Negative for behavioral problems and dysphoric mood.    Objective:  BP 135/79 mmHg  Pulse 76  Temp(Src) 98.3 F (36.8 C) (Oral)  Resp 18  Ht 5\' 3"  (1.6 m)  Wt 245 lb 6.4 oz (111.313 kg)  BMI 43.48 kg/m2  SpO2 100%  LMP 08/08/2015  BP/Weight 08/18/2015 08/03/2015 07/26/2015  Systolic BP 135 125 116  Diastolic BP 79 85 67  Wt. (Lbs) 245.4 241.6 -  BMI 43.48 42.81 -      Physical Exam  Constitutional: She is oriented to person, place, and time. She appears well-developed and well-nourished.  Cardiovascular: Normal rate, normal heart sounds and intact distal pulses.   No murmur heard. Pulmonary/Chest: Effort normal and breath sounds normal. She has no wheezes. She has no rales. She exhibits tenderness (Reproducible chest wall pain).  Abdominal: Soft. Bowel sounds are normal. She exhibits no distension and no mass. There is no tenderness.  Musculoskeletal:  Normal range of motion.  Neurological: She is alert and oriented to person, place, and time.     Assessment & Plan:   1. Vitamin D deficiency Patient is yet to pick up new prescription and this could explain fatigue. The fact that she also does not rest well from having to care for her 6 kids could also be contributing. - ergocalciferol (VITAMIN D2) 50000 units capsule; Take 1 capsule (50,000 Units total) by  mouth once a week.  Dispense: 9 capsule; Refill: 0  2. Essential hypertension Controlled Currently on diet control No medications for now as she also tends to run low  3.Obesity (HCC) She has lost significant amount of weight and is working on losing more  4. Toothache Scheduled to see the dentist. Completed antibiotics 2 weeks ago  Ibuprofen for pain   Meds ordered this encounter  Medications  . ergocalciferol (VITAMIN D2) 50000 units capsule    Sig: Take 1 capsule (50,000 Units total) by mouth once a week.    Dispense:  9 capsule    Refill:  0    Follow-up: Return in about 2 weeks (around 09/01/2015) for Follow-up on hypertension.   Jaclyn Shaggy MD

## 2015-08-19 NOTE — Telephone Encounter (Signed)
Have patient call and make an appointment.

## 2015-08-24 ENCOUNTER — Telehealth: Payer: Self-pay

## 2015-08-24 ENCOUNTER — Other Ambulatory Visit: Payer: Self-pay

## 2015-08-24 DIAGNOSIS — K0889 Other specified disorders of teeth and supporting structures: Secondary | ICD-10-CM

## 2015-08-24 NOTE — Telephone Encounter (Signed)
Placed call to patient did not answer. A message was left for the patient to return my call. 2nd attempt to reach patient.

## 2015-08-25 DIAGNOSIS — K0889 Other specified disorders of teeth and supporting structures: Secondary | ICD-10-CM | POA: Insufficient documentation

## 2015-08-25 NOTE — Progress Notes (Signed)
Referral placed.

## 2015-08-28 ENCOUNTER — Telehealth: Payer: Self-pay | Admitting: Family Medicine

## 2015-08-28 NOTE — Telephone Encounter (Addendum)
Place call to patient, patient mailbox was full.   Note to patient:  Please let patient know that her next depo injection is between 05/15-05/29.

## 2015-08-28 NOTE — Telephone Encounter (Signed)
Patient called requesting to speak to nurse regarding dry mouth, patient states she has been having headaches and dry mouth for a couple of days, please f/up

## 2015-08-29 NOTE — Telephone Encounter (Signed)
Patient called requesting to speak to nurse regarding dry mouth, patient states she has been having headaches and dry mouth for a couple of days, please f/u

## 2015-08-29 NOTE — Telephone Encounter (Signed)
Patient mailbox is full, couldn't leave a message.

## 2015-08-29 NOTE — Telephone Encounter (Signed)
Place call to patient, patient did not answer. Patient mailbox is still full. 2nd attempt to contact patient about her next depo injection.   Note to patient:  Next injection between 05/15-05-29, 2017.

## 2015-08-31 ENCOUNTER — Telehealth: Payer: Self-pay

## 2015-08-31 ENCOUNTER — Telehealth: Payer: Self-pay | Admitting: Family Medicine

## 2015-08-31 NOTE — Telephone Encounter (Signed)
Her blood pressure can be elevated due to her tooth ache and also from the ibuprofen. She does not need a refill of antibiotics at this time since she completed amoxicillin not long ago and she is advised to continue the ibuprofen as needed and keep her appointment with the dentist. Also advised to drink lots of fluids to correct the dry mouth. No antihypertensives indicated for her blood pressure at this time.

## 2015-08-31 NOTE — Telephone Encounter (Signed)
Received return call from patient. Informed patient that this Case Manager spoke with Dr. Venetia NightAmao about her toothache, dry mouth, and elevated BP, and informed patient of Dr. Jen MowAmao's response/instructions (see below). Patient indicated that Ibuprofen is not controlling her tooth pain, and that the "pain is horrible." She indicated she was having problems sleeping due to the pain. Patient indicated she was also very concerned about her dry mouth. Informed her that Dr. Venetia NightAmao advised her to drink plenty of fluids. Patient stated she is "constantly drinking water and it is not helping with dry mouth."  Patient requested to be seen by Dr. Venetia NightAmao. Informed her that Dr. Venetia NightAmao does not have any available appointments, but patient wanting to be seen for tooth pain and dry mouth. Appointment available on 09/01/15 at 1030 with Crystal CoAngela McClung, Crystal Burns. Patient appreciative of appointment.       Crystal ShaggyEnobong Amao, MD at 08/31/2015 12:55 PM     Status: Signed       Expand All Collapse All  Her blood pressure can be elevated due to her tooth ache and also from the ibuprofen. She does not need a refill of antibiotics at this time since she completed amoxicillin not long ago and she is advised to continue the ibuprofen as needed and keep her appointment with the dentist. Also advised to drink lots of fluids to correct the dry mouth. No antihypertensives indicated for her blood pressure at this time.

## 2015-08-31 NOTE — Telephone Encounter (Signed)
Patient is requesting an antibiotic and pain medicine for her toothache....patient is upset, she states she called Monday and has yet to hear back and is in pain.  Please follow up with patient

## 2015-08-31 NOTE — Telephone Encounter (Signed)
This Case Manager placed return call to patient. Patient indicated she is having wisdom tooth (bottom right) pain. Patient indicated tooth is "rotten in the middle" and causing mouth pain and pain that radiates into ear, jaw, and head. Patient indicated her pain is currently a 7/10 because she recently took ibuprofen, typically pain "11/10". Patient thinks she may need an antibiotic and a medication for pain. Indicates she completed a course of antibiotics about a month ago. Has a dental appointment on 09/08/15, but could not be seen earlier per patient.   Patient also expresses concern that she has had a "dry mouth" recently. Patient also concerned that pain causing her BP to rise; patient indicates her highest BP yesterday was "144/100."   Will route to Dr. Venetia NightAmao for consideration.

## 2015-08-31 NOTE — Telephone Encounter (Signed)
This Case Manager placed call to patient to inform her of Dr. Jen MowAmao's response and instructions. Call placed to #[phone]; unable to reach and unable to leave voicemail.  Jaclyn ShaggyEnobong Amao, MD at 08/31/2015 12:55 PM     Status: Signed       Expand All Collapse All   Her blood pressure can be elevated due to her tooth ache and also from the ibuprofen. She does not need a refill of antibiotics at this time since she completed amoxicillin not long ago and she is advised to continue the ibuprofen as needed and keep her appointment with the dentist. Also advised to drink lots of fluids to correct the dry mouth. No antihypertensives indicated for her blood pressure at this time.

## 2015-09-01 ENCOUNTER — Ambulatory Visit (HOSPITAL_BASED_OUTPATIENT_CLINIC_OR_DEPARTMENT_OTHER): Payer: Medicaid Other | Admitting: Physician Assistant

## 2015-09-01 ENCOUNTER — Ambulatory Visit: Payer: Medicaid Other | Attending: Family Medicine

## 2015-09-01 VITALS — BP 115/83 | HR 76 | Temp 98.4°F | Ht 63.0 in | Wt 248.4 lb

## 2015-09-01 DIAGNOSIS — K047 Periapical abscess without sinus: Secondary | ICD-10-CM | POA: Diagnosis not present

## 2015-09-01 DIAGNOSIS — I10 Essential (primary) hypertension: Secondary | ICD-10-CM | POA: Insufficient documentation

## 2015-09-01 DIAGNOSIS — K0889 Other specified disorders of teeth and supporting structures: Secondary | ICD-10-CM | POA: Insufficient documentation

## 2015-09-01 DIAGNOSIS — R682 Dry mouth, unspecified: Secondary | ICD-10-CM | POA: Diagnosis not present

## 2015-09-01 DIAGNOSIS — J45909 Unspecified asthma, uncomplicated: Secondary | ICD-10-CM | POA: Diagnosis not present

## 2015-09-01 DIAGNOSIS — Z91013 Allergy to seafood: Secondary | ICD-10-CM | POA: Insufficient documentation

## 2015-09-01 DIAGNOSIS — F329 Major depressive disorder, single episode, unspecified: Secondary | ICD-10-CM | POA: Insufficient documentation

## 2015-09-01 DIAGNOSIS — Z79899 Other long term (current) drug therapy: Secondary | ICD-10-CM | POA: Insufficient documentation

## 2015-09-01 LAB — GLUCOSE, POCT (MANUAL RESULT ENTRY): POC Glucose: 87 mg/dl (ref 70–99)

## 2015-09-01 MED ORDER — IBUPROFEN 800 MG PO TABS
800.0000 mg | ORAL_TABLET | Freq: Three times a day (TID) | ORAL | Status: DC | PRN
Start: 2015-09-01 — End: 2015-10-30

## 2015-09-01 MED ORDER — PENICILLIN V POTASSIUM 500 MG PO TABS: 500.0000 mg | ORAL_TABLET | Freq: Three times a day (TID) | ORAL | Status: DC

## 2015-09-01 MED FILL — IBUPROFEN 800 MG TABLET: 800 | 10 days supply | Qty: 30 | Fill #0

## 2015-09-01 MED FILL — PENICILLIN VK 500 MG TABLET: 500 | 10 days supply | Qty: 30 | Fill #0

## 2015-09-01 NOTE — Progress Notes (Signed)
Pt complains of wisdom tooth pain makes her right ear hurt.  She took a ibuprofen today.

## 2015-09-01 NOTE — Progress Notes (Signed)
Patient ID: Crystal Burns, female   DOB: [birth year], 35 y.o.   MRN: [id number]   Amery Vandenbos, is a 35 y.o. female  GNF:[id number]  [id number]  DOB - 1981-03-05  Chief Complaint  Patient presents with  . mouth dry  . Dental Pain        Subjective:  Chief Complaint and HPI: Crystal Burns is a 35 y.o. female here today with multiple concerns.  She is having tooth pain, R lower posterior tooth-says it is a wisdom tooth.  She has an appointment in 1 week with the dentist.   She has had infections of this area on and off for months.  It is sometimes causing her ear pain.   ibuprofen helps some, but she feels  would be better.  She is also complaining of dry mouth and wants to be checked for diabetes.  She is concerned that Vit D supplements are causing her to have dry mouth. She has only had 1 dose of vitamin D.   Also mentions that she has gained about 7 pounds and she says she doesn't know why but goes on to tell me she eats cookies all day at her sedentary job and eats cookies at night.  She has 6 children ages 21 months to 17 years.  Blood pressure has been high sometimes in the evenings.  She has had lengthy discussions with Dr. Venetia Night about this.  Her BP is currently controlled off of medications.      ROS:   Constitutional:  No f/c, No night sweats, No unexplained weight loss. EENT:  No vision changes, No blurry vision, No hearing changes. + dry mouth, + mouth pain Respiratory: No cough, No SOB Cardiac: No CP, no palpitations GI:  No abd pain, No N/V/D. GU: No Urinary s/sx Musculoskeletal: No joint pain Neuro: No headache, no dizziness, no motor weakness.  Skin: No rash Endocrine:  No polydipsia. No polyuria.  Psych: Denies SI/HI  No problems updated.  ALLERGIES: Allergies  Allergen Reactions  . Bee Venom Hives and Swelling  . Lisinopril Shortness Of Breath and Cough  . Shellfish Allergy Anaphylaxis    PAST MEDICAL HISTORY: Past Medical  History  Diagnosis Date  . History of gonorrhea   . Gestational diabetes     2012  . Hypertension     2004  . Asthma     2010-only bothers pt during bad cold  . Depression     2010-had when mother passed away  . Pregnancy induced hypertension   . Obesity     MEDICATIONS AT HOME: Prior to Admission medications   Medication Sig Start Date End Date Taking? Authorizing Provider  albuterol (PROVENTIL HFA;VENTOLIN HFA) 108 (90 Base) MCG/ACT inhaler Inhale 2 puffs into the lungs every 6 (six) hours as needed for wheezing or shortness of breath. 07/26/15  Yes Linna Hoff, MD  ergocalciferol (VITAMIN D2) 50000 units capsule Take 1 capsule (50,000 Units total) by mouth once a week. 08/18/15  Yes Jaclyn Shaggy, MD  medroxyPROGESTERone (DEPO-PROVERA) 150 MG/ML injection Inject 1 mL (150 mg total) into the muscle every 3 (three) months. 02/27/15  Yes Rachelle A Denney, CNM  acetaminophen (TYLENOL) 500 MG tablet Take 1,000 mg by mouth every 6 (six) hours as needed for moderate pain. Reported on 09/01/2015    Historical Provider, MD  ibuprofen (ADVIL,MOTRIN) 800 MG tablet Take 1 tablet (800 mg total) by mouth every 8 (eight) hours as needed. 09/01/15   Anders Simmonds, PA-C  penicillin v  potassium (VEETID) 500 MG tablet Take 1 tablet (500 mg total) by mouth 3 (three) times daily. 09/01/15   Anders SimmondsAngela M McClung, PA-C     Objective:  EXAM:   Filed Vitals:   09/01/15 0946  BP: 115/83  Pulse: 76  Temp: 98.4 F (36.9 C)  TempSrc: Oral  Height: 5\' 3"  (1.6 m)  Weight: 248 lb 6.4 oz (112.674 kg)  SpO2: 100%    General appearance : A&OX3. NAD. Non-toxic-appearing HEENT: Atraumatic and Normocephalic.  PERRLA. EOM intact.  TM clear B. Mouth-MMM, post pharynx WNL w/o erythema, No PND.  Poor dentition generally and plaque/tartar build-up throughout.  Tooth #32 with erythema and swelling at gum base.  TTP with manipulation.  No definite abscess or purulence.  She is missing several teeth.   Neck: supple,  no JVD. No cervical lymphadenopathy. No thyromegaly Chest/Lungs:  Breathing-non-labored, Good air entry bilaterally, breath sounds normal without rales, rhonchi, or wheezing  CVS: S1 S2 regular, no murmurs, gallops, rubs  Neurology:  CN II-XII grossly intact, Non focal.   Psych:  TP linear. J/I WNL. Normal speech. Appropriate eye contact and affect.  Skin:  No Rash  Data Review Lab Results  Component Value Date   HGBA1C 5.60 05/18/2015   HGBA1C  01/30/2008    6.1 (NOTE)   The ADA recommends the following therapeutic goal for glycemic   control related to Hgb A1C measurement:   Goal of Therapy:   < 7.0% Hgb A1C   Reference: American Diabetes Association: Clinical Practice   Recommendations 2008, Diabetes Care,  2008, 31:(Suppl 1).     Assessment & Plan   1. Tooth pain - ibuprofen (ADVIL,MOTRIN) 800 MG tablet; Take 1 tablet (800 mg total) by mouth every 8 (eight) hours as needed.  Dispense: 30 tablet; Refill: 0 Keep dental appt as planned  2. Tooth abscess If needed/worsens - penicillin v potassium (VEETID) 500 MG tablet; Take 1 tablet (500 mg total) by mouth 3 (three) times daily.  Dispense: 30 tablet; Refill: 0  3. Dry mouth - Glucose (CBG) Not diabetic salt water gargles tid or biotene  Patient have been counseled extensively about nutrition and exercise  Return in about 4 weeks (around 09/29/2015) for f/up with Dr. Venetia NightAmao.  The patient was given clear instructions to go to ER or return to medical center if symptoms don't improve, worsen or new problems develop. The patient verbalized understanding. The patient was told to call to get lab results if they haven't heard anything in the next week.     Georgian CoAngela McClung, PA-C [facility] [place], [place] [phone]   09/01/2015, 11:10 AM

## 2015-09-01 NOTE — Patient Instructions (Signed)
Warm Salt water gargles 3-4 times/day  Biotene mouthwash helps dry mouth.

## 2015-09-08 ENCOUNTER — Encounter: Payer: Self-pay | Admitting: Family Medicine

## 2015-09-08 ENCOUNTER — Ambulatory Visit: Payer: Medicaid Other

## 2015-09-08 ENCOUNTER — Ambulatory Visit: Payer: Medicaid Other | Attending: Family Medicine | Admitting: Family Medicine

## 2015-09-08 VITALS — BP 130/83 | Temp 98.0°F | Ht 63.0 in | Wt 251.0 lb

## 2015-09-08 DIAGNOSIS — J208 Acute bronchitis due to other specified organisms: Secondary | ICD-10-CM

## 2015-09-08 DIAGNOSIS — Z0000 Encounter for general adult medical examination without abnormal findings: Secondary | ICD-10-CM | POA: Diagnosis not present

## 2015-09-08 MED ORDER — HYDROCODONE-HOMATROPINE 5-1.5 MG/5ML PO SYRP: 5.0000 mL | ORAL_SOLUTION | Freq: Three times a day (TID) | ORAL | Status: DC | PRN

## 2015-09-08 MED ORDER — PREDNISONE 20 MG PO TABS: 20.0000 mg | ORAL_TABLET | Freq: Two times a day (BID) | ORAL | Status: DC

## 2015-09-08 MED ORDER — MEDROXYPROGESTERONE ACETATE 150 MG/ML IM SUSP
150.0000 mg | Freq: Once | INTRAMUSCULAR | Status: AC
  Administered 2015-09-08: 150 mg via INTRAMUSCULAR

## 2015-09-08 NOTE — Progress Notes (Signed)
Subjective:  Patient ID: Crystal AnoLaquetha T Spurr, female    DOB: Jun 14, 1980  Age: 35 y.o. MRN: [id number]  CC: Cough   HPI Crystal Burns is a 35 year old female with a history of diet-controlled hypertension who comes into the clinic complaining of a three-day history of cough productive of yellowish sputum, sore throat, rhinorrhea and wheezing. Her children have all been sick with a cold and she has used Coricidin and her albuterol inhaler with no relief in symptoms. Denies fever or sinus pain.  Outpatient Prescriptions Prior to Visit  Medication Sig Dispense Refill  . ergocalciferol (VITAMIN D2) 50000 units capsule Take 1 capsule (50,000 Units total) by mouth once a week. 9 capsule 0  . medroxyPROGESTERone (DEPO-PROVERA) 150 MG/ML injection Inject 1 mL (150 mg total) into the muscle every 3 (three) months. 150 mL 3  . penicillin v potassium (VEETID) 500 MG tablet Take 1 tablet (500 mg total) by mouth 3 (three) times daily. 30 tablet 0  . acetaminophen (TYLENOL) 500 MG tablet Take 1,000 mg by mouth every 6 (six) hours as needed for moderate pain. Reported on 09/08/2015    . albuterol (PROVENTIL HFA;VENTOLIN HFA) 108 (90 Base) MCG/ACT inhaler Inhale 2 puffs into the lungs every 6 (six) hours as needed for wheezing or shortness of breath. (Patient not taking: Reported on 09/08/2015) 1 Inhaler 0  . ibuprofen (ADVIL,MOTRIN) 800 MG tablet Take 1 tablet (800 mg total) by mouth every 8 (eight) hours as needed. (Patient not taking: Reported on 09/08/2015) 30 tablet 0   No facility-administered medications prior to visit.    ROS Review of Systems  Constitutional: Negative for activity change and appetite change.  HENT: Positive for postnasal drip and sore throat. Negative for sinus pressure.   Respiratory: Positive for cough, shortness of breath and wheezing. Negative for chest tightness.   Cardiovascular: Negative for chest pain and palpitations.  Gastrointestinal: Negative for abdominal pain,  constipation and abdominal distention.  Genitourinary: Negative.   Musculoskeletal: Negative.   Psychiatric/Behavioral: Negative for behavioral problems and dysphoric mood.    Objective:  BP 130/83 mmHg  Temp(Src) 98 F (36.7 C)  Ht 5\' 3"  (1.6 m)  Wt 251 lb (113.853 kg)  BMI 44.47 kg/m2  SpO2 98%  LMP 08/08/2015  BP/Weight 09/08/2015 09/01/2015 08/18/2015  Systolic BP 130 115 135  Diastolic BP 83 83 79  Wt. (Lbs) 251 248.4 245.4  BMI 44.47 44.01 43.48      Physical Exam  Constitutional: She is oriented to person, place, and time. She appears well-developed and well-nourished.  HENT:  Head: Normocephalic.  Right Ear: External ear normal.  Left Ear: External ear normal.  Mouth/Throat: Oropharynx is clear and moist.  Eyes: Pupils are equal, round, and reactive to light.  Cardiovascular: Normal rate, normal heart sounds and intact distal pulses.   No murmur heard. Pulmonary/Chest: Effort normal. She has wheezes. She has no rales. She exhibits no tenderness.  Abdominal: Soft. Bowel sounds are normal. She exhibits no distension and no mass. There is no tenderness.  Musculoskeletal: Normal range of motion.  Neurological: She is alert and oriented to person, place, and time.     Assessment & Plan:   1. Acute bronchitis due to other specified organisms She is currently on penicillin which she received last week for a tooth infection which I have advised her to continue as well as her Proventil MDI. She is requesting a steroid shot which we had (so I have placed on oral prednisone.-Side effects including weight  gain discussed. - HYDROcodone-homatropine (HYCODAN) 5-1.5 MG/5ML syrup; Take 5 mLs by mouth every 8 (eight) hours as needed for cough.  Dispense: 120 mL; Refill: 0 - predniSONE (DELTASONE) 20 MG tablet; Take 1 tablet (20 mg total) by mouth 2 (two) times daily with a meal.  Dispense: 10 tablet; Refill: 0   Meds ordered this encounter  Medications  .  HYDROcodone-homatropine (HYCODAN) 5-1.5 MG/5ML syrup    Sig: Take 5 mLs by mouth every 8 (eight) hours as needed for cough.    Dispense:  120 mL    Refill:  0  . predniSONE (DELTASONE) 20 MG tablet    Sig: Take 1 tablet (20 mg total) by mouth 2 (two) times daily with a meal.    Dispense:  10 tablet    Refill:  0    Follow-up: Return in about 3 months (around 12/09/2015) for follow up on Hypertension.   Jaclyn Shaggy MD

## 2015-09-08 NOTE — Patient Instructions (Signed)

## 2015-09-08 NOTE — Addendum Note (Signed)
Addended by: Lieutenant DiegoHINES, Rhylee Nunn A on: 09/08/2015 02:59 PM   Modules accepted: Orders

## 2015-09-09 ENCOUNTER — Encounter (HOSPITAL_COMMUNITY): Payer: Self-pay | Admitting: *Deleted

## 2015-09-09 ENCOUNTER — Ambulatory Visit (HOSPITAL_COMMUNITY)
Admission: EM | Admit: 2015-09-09 | Discharge: 2015-09-09 | Disposition: A | Discharge status: Home / Self Care | Payer: Medicaid Other | Attending: Family Medicine | Admitting: Family Medicine

## 2015-09-09 DIAGNOSIS — F064 Anxiety disorder due to known physiological condition: Secondary | ICD-10-CM

## 2015-09-09 DIAGNOSIS — F410 Panic disorder [episodic paroxysmal anxiety] without agoraphobia: Secondary | ICD-10-CM

## 2015-09-09 NOTE — ED Notes (Signed)
Pt concerned about elevated BP of 151/101 today along with increased HR (110's).  Pt currently being treated for bronchitis.

## 2015-09-09 NOTE — ED Provider Notes (Signed)
CSN: [id number]     Arrival date & time 09/09/15  1720 History   First MD Initiated Contact with Patient 09/09/15 1724     Chief Complaint  Patient presents with  . Hypertension   (Consider location/radiation/quality/duration/timing/severity/associated sxs/prior Treatment) Patient is a 35 y.o. female presenting with hypertension. The history is provided by the patient.  Hypertension This is a new problem. The current episode started 6 to 12 hours ago (mult med issues of concern  all related to simple anxiety and worry). The problem has not changed since onset.Associated symptoms include headaches. Pertinent negatives include no chest pain and no abdominal pain.    Past Medical History  Diagnosis Date  . History of gonorrhea   . Gestational diabetes     2012  . Hypertension     2004  . Asthma     2010-only bothers pt during bad cold  . Depression     2010-had when mother passed away  . Pregnancy induced hypertension   . Obesity    Past Surgical History  Procedure Laterality Date  . Dilation and curettage of uterus    . Cholecystectomy    . Wisdom tooth extraction     Family History  Problem Relation Age of Onset  . Hypertension Mother   . Diabetes Mother   . Cancer Mother   . Hypertension Father   . Diabetes Father   . Hyperlipidemia Father   . Arthritis    . Asthma    . Breast cancer    . Heart failure    . Congenital heart disease    . Depression    . Heart attack     Social History  Substance Use Topics  . Smoking status: Former Smoker    Types: Cigarettes    Quit date: 10/31/2010  . Smokeless tobacco: Never Used  . Alcohol Use: No   OB History    Gravida Para Term Preterm AB TAB SAB Ectopic Multiple Living   0 4 0 4 0 0 6     Review of Systems  Constitutional: Negative.   HENT: Positive for congestion, postnasal drip, rhinorrhea and sore throat.   Respiratory: Positive for cough. Negative for wheezing.   Cardiovascular: Negative.  Negative  for chest pain.  Gastrointestinal: Negative.  Negative for abdominal pain.  Neurological: Positive for headaches.  All other systems reviewed and are negative.   Allergies  Bee venom; Lisinopril; and Shellfish allergy  Home Medications   Prior to Admission medications   Medication Sig Start Date End Date Taking? Authorizing Provider  albuterol (PROVENTIL HFA;VENTOLIN HFA) 108 (90 Base) MCG/ACT inhaler Inhale 2 puffs into the lungs every 6 (six) hours as needed for wheezing or shortness of breath. 07/26/15  Yes Linna Hoff, MD  ergocalciferol (VITAMIN D2) 50000 units capsule Take 1 capsule (50,000 Units total) by mouth once a week. 08/18/15  Yes Jaclyn Shaggy, MD  penicillin v potassium (VEETID) 500 MG tablet Take 1 tablet (500 mg total) by mouth 3 (three) times daily. Patient taking differently: Take 500 mg by mouth 3 (three) times daily. Not taking as directed 09/01/15  Yes Anders Simmonds, PA-C  predniSONE (DELTASONE) 20 MG tablet Take 1 tablet (20 mg total) by mouth 2 (two) times daily with a meal. 09/08/15  Yes Jaclyn Shaggy, MD  acetaminophen (TYLENOL) 500 MG tablet Take 1,000 mg by mouth every 6 (six) hours as needed for moderate pain. Reported on 09/08/2015    Historical Provider, MD  HYDROcodone-homatropine (HYCODAN) 5-1.5 MG/5ML syrup Take 5 mLs by mouth every 8 (eight) hours as needed for cough. 09/08/15   Jaclyn ShaggyEnobong Amao, MD  ibuprofen (ADVIL,MOTRIN) 800 MG tablet Take 1 tablet (800 mg total) by mouth every 8 (eight) hours as needed. Patient not taking: Reported on 09/08/2015 09/01/15   Anders SimmondsAngela M McClung, PA-C  medroxyPROGESTERone (DEPO-PROVERA) 150 MG/ML injection Inject 1 mL (150 mg total) into the muscle every 3 (three) months. 02/27/15   Roe Coombsachelle A Denney, CNM   Meds Ordered and Administered this Visit  Medications - No data to display  BP 121/81 mmHg  Pulse 113  Temp(Src) 99.1 F (37.3 C) (Oral)  SpO2 96%  LMP 08/08/2015  Breastfeeding? No No data found.   Physical Exam   Constitutional: She is oriented to person, place, and time. She appears well-developed and well-nourished. She appears distressed.  HENT:  Right Ear: External ear normal.  Left Ear: External ear normal.  Mouth/Throat: Oropharynx is clear and moist.  Eyes: Pupils are equal, round, and reactive to light.  Neck: Normal range of motion. Neck supple.  Cardiovascular: Normal rate, regular rhythm, normal heart sounds and intact distal pulses.   Pulmonary/Chest: Effort normal and breath sounds normal. She has no wheezes.  Lymphadenopathy:    She has no cervical adenopathy.  Neurological: She is alert and oriented to person, place, and time. No cranial nerve deficit.  Skin: Skin is warm and dry.  Psychiatric: Her behavior is normal. Her mood appears anxious. Her speech is rapid and/or pressured and tangential.  Nursing note and vitals reviewed.   ED Course  Procedures (including critical care time)  Labs Review Labs Reviewed - No data to display  Imaging Review No results found.   Visual Acuity Review  Right Eye Distance:   Left Eye Distance:   Bilateral Distance:    Right Eye Near:   Left Eye Near:    Bilateral Near:         MDM   1. Anxiety disorder due to general medical condition with panic attack        Linna HoffJames D Bricelyn Freestone, MD 09/09/15 1744

## 2015-09-09 NOTE — Discharge Instructions (Signed)
See your doctor as needed

## 2015-09-13 NOTE — Telephone Encounter (Signed)
Please call patient and schedule her with one of the new doctors for a surgical consult- BTL

## 2015-09-14 ENCOUNTER — Encounter (HOSPITAL_COMMUNITY): Payer: Self-pay | Admitting: Emergency Medicine

## 2015-09-14 ENCOUNTER — Ambulatory Visit (HOSPITAL_COMMUNITY)
Admission: EM | Admit: 2015-09-14 | Discharge: 2015-09-14 | Disposition: A | Discharge status: Home / Self Care | Payer: Medicaid Other | Attending: Emergency Medicine | Admitting: Emergency Medicine

## 2015-09-14 DIAGNOSIS — B9789 Other viral agents as the cause of diseases classified elsewhere: Secondary | ICD-10-CM

## 2015-09-14 DIAGNOSIS — B349 Viral infection, unspecified: Secondary | ICD-10-CM

## 2015-09-14 DIAGNOSIS — J988 Other specified respiratory disorders: Secondary | ICD-10-CM

## 2015-09-14 DIAGNOSIS — J069 Acute upper respiratory infection, unspecified: Secondary | ICD-10-CM | POA: Diagnosis not present

## 2015-09-14 MED ORDER — DOXYCYCLINE HYCLATE 100 MG PO CAPS: 100.0000 mg | ORAL_CAPSULE | Freq: Two times a day (BID) | ORAL | Status: DC

## 2015-09-14 NOTE — ED Notes (Signed)
Patient c/o cold symptoms for about 8 days. Patient reports she has been seen and treated, finished a round of antibiotics and prednisone and did not feel any better. Patient reports she has a lot of congestion and her voice is raspy. She reports she was given a cough medicine but has not been able to pick it up from the pharmacy yet due to cost and will try to get it tomorrow.

## 2015-09-14 NOTE — ED Provider Notes (Signed)
CSN: [id number]     Arrival date & time 09/14/15  1940 History   First MD Initiated Contact with Patient 09/14/15 2020     Chief Complaint  Patient presents with  . URI   (Consider location/radiation/quality/duration/timing/severity/associated sxs/prior Treatment) HPI History obtained from patient:  Pt presents with the cc of:  Cough upper respiratory symptoms Duration of symptoms: One week Treatment prior to arrival: Over-the-counter medications without relief of symptoms Context: She states that she has been sick with upper respiratory issues for the past week and feels that she is getting worse. Just completed a course of antibiotics for a dental infection Other symptoms include: Hoarseness Pain score: 2 FAMILY HISTORY: Hypertension and diabetes in mother     Past Medical History  Diagnosis Date  . History of gonorrhea   . Gestational diabetes     2012  . Hypertension     2004  . Asthma     2010-only bothers pt during bad cold  . Depression     2010-had when mother passed away  . Pregnancy induced hypertension   . Obesity    Past Surgical History  Procedure Laterality Date  . Dilation and curettage of uterus    . Cholecystectomy    . Wisdom tooth extraction     Family History  Problem Relation Age of Onset  . Hypertension Mother   . Diabetes Mother   . Cancer Mother   . Hypertension Father   . Diabetes Father   . Hyperlipidemia Father   . Arthritis    . Asthma    . Breast cancer    . Heart failure    . Congenital heart disease    . Depression    . Heart attack     Social History  Substance Use Topics  . Smoking status: Former Smoker    Types: Cigarettes    Quit date: 10/31/2010  . Smokeless tobacco: Never Used  . Alcohol Use: No   OB History    Gravida Para Term Preterm AB TAB SAB Ectopic Multiple Living   11 7 7  0 4 0 4 0 0 6     Review of Systems  Denies: HEADACHE, NAUSEA, ABDOMINAL PAIN, CHEST PAIN, CONGESTION, DYSURIA, SHORTNESS OF  BREATH  Allergies  Bee venom; Lisinopril; and Shellfish allergy  Home Medications   Prior to Admission medications   Medication Sig Start Date End Date Taking? Authorizing Provider  acetaminophen (TYLENOL) 500 MG tablet Take 1,000 mg by mouth every 6 (six) hours as needed for moderate pain. Reported on 09/08/2015    Historical Provider, MD  albuterol (PROVENTIL HFA;VENTOLIN HFA) 108 (90 Base) MCG/ACT inhaler Inhale 2 puffs into the lungs every 6 (six) hours as needed for wheezing or shortness of breath. 07/26/15   Linna HoffJames D Kindl, MD  doxycycline (VIBRAMYCIN) 100 MG capsule Take 1 capsule (100 mg total) by mouth 2 (two) times daily. 09/14/15   Tharon AquasFrank C Christyne Mccain, PA  ergocalciferol (VITAMIN D2) 50000 units capsule Take 1 capsule (50,000 Units total) by mouth once a week. 08/18/15   Jaclyn ShaggyEnobong Amao, MD  HYDROcodone-homatropine (HYCODAN) 5-1.5 MG/5ML syrup Take 5 mLs by mouth every 8 (eight) hours as needed for cough. 09/08/15   Jaclyn ShaggyEnobong Amao, MD  ibuprofen (ADVIL,MOTRIN) 800 MG tablet Take 1 tablet (800 mg total) by mouth every 8 (eight) hours as needed. Patient not taking: Reported on 09/08/2015 09/01/15   Anders SimmondsAngela M McClung, PA-C  medroxyPROGESTERone (DEPO-PROVERA) 150 MG/ML injection Inject 1 mL (150 mg total) into the  muscle every 3 (three) months. 02/27/15   Rachelle A Denney, CNM  penicillin v potassium (VEETID) 500 MG tablet Take 1 tablet (500 mg total) by mouth 3 (three) times daily. Patient taking differently: Take 500 mg by mouth 3 (three) times daily. Not taking as directed 09/01/15   Anders Simmonds, PA-C  predniSONE (DELTASONE) 20 MG tablet Take 1 tablet (20 mg total) by mouth 2 (two) times daily with a meal. 09/08/15   Jaclyn Shaggy, MD   Meds Ordered and Administered this Visit  Medications - No data to display  BP 118/77 mmHg  Pulse 77  Temp(Src) 98.7 F (37.1 C) (Oral)  SpO2 100%  LMP 08/08/2015 No data found.   Physical Exam NURSES NOTES AND VITAL SIGNS REVIEWED. CONSTITUTIONAL: Well  developed, well nourished, no acute distress HEENT: normocephalic, atraumatic EYES: Conjunctiva normal NECK:normal ROM, supple, no adenopathy PULMONARY:No respiratory distress, normal effort ABDOMINAL: Soft, ND, NT BS+, No CVAT MUSCULOSKELETAL: Normal ROM of all extremities,  SKIN: warm and dry without rash PSYCHIATRIC: Mood and affect, behavior are normal  ED Course  Procedures (including critical care time)  Labs Review Labs Reviewed - No data to display  Imaging Review No results found.   Visual Acuity Review  Right Eye Distance:   Left Eye Distance:   Bilateral Distance:    Right Eye Near:   Left Eye Near:    Bilateral Near:      Patient most likely has a viral illness. She is insistent on antibiotics. I have advised patient that she will most likely make a full recovery. The antibiotics may make.no difference to her improvement at all.   MDM   1. Acute URI   2. Viral respiratory illness     Patient is reassured that there are no issues that require transfer to higher level of care at this time or additional tests. Patient is advised to continue home symptomatic treatment. Patient is advised that if there are new or worsening symptoms to attend the emergency department, contact primary care provider, or return to UC. Instructions of care provided discharged home in stable condition.    THIS NOTE WAS GENERATED USING A VOICE RECOGNITION SOFTWARE PROGRAM. ALL REASONABLE EFFORTS  WERE MADE TO PROOFREAD THIS DOCUMENT FOR ACCURACY.  I have verbally reviewed the discharge instructions with the patient. A printed AVS was given to the patient.  All questions were answered prior to discharge.      Tharon Aquas, PA 09/14/15 2049

## 2015-09-14 NOTE — Discharge Instructions (Signed)
Cough, Adult °Coughing is a reflex that clears your throat and your airways. Coughing helps to heal and protect your lungs. It is normal to cough occasionally, but a cough that happens with other symptoms or lasts a long time may be a sign of a condition that needs treatment. A cough may last only 2-3 weeks (acute), or it may last longer than 8 weeks (chronic). °CAUSES °Coughing is commonly caused by: °· Breathing in substances that irritate your lungs. °· A viral or bacterial respiratory infection. °· Allergies. °· Asthma. °· Postnasal drip. °· Smoking. °· Acid backing up from the stomach into the esophagus (gastroesophageal reflux). °· Certain medicines. °· Chronic lung problems, including COPD (or rarely, lung cancer). °· Other medical conditions such as heart failure. °HOME CARE INSTRUCTIONS  °Pay attention to any changes in your symptoms. Take these actions to help with your discomfort: °· Take medicines only as told by your health care provider. °· If you were prescribed an antibiotic medicine, take it as told by your health care provider. Do not stop taking the antibiotic even if you start to feel better. °· Talk with your health care provider before you take a cough suppressant medicine. °· Drink enough fluid to keep your urine clear or pale yellow. °· If the air is dry, use a cold steam vaporizer or humidifier in your bedroom or your home to help loosen secretions. °· Avoid anything that causes you to cough at work or at home. °· If your cough is worse at night, try sleeping in a semi-upright position. °· Avoid cigarette smoke. If you smoke, quit smoking. If you need help quitting, ask your health care provider. °· Avoid caffeine. °· Avoid alcohol. °· Rest as needed. °SEEK MEDICAL CARE IF:  °· You have new symptoms. °· You cough up pus. °· Your cough does not get better after 2-3 weeks, or your cough gets worse. °· You cannot control your cough with suppressant medicines and you are losing sleep. °· You  develop pain that is getting worse or pain that is not controlled with pain medicines. °· You have a fever. °· You have unexplained weight loss. °· You have night sweats. °SEEK IMMEDIATE MEDICAL CARE IF: °· You cough up blood. °· You have difficulty breathing. °· Your heartbeat is very fast. °  °This information is not intended to replace advice given to you by your health care provider. Make sure you discuss any questions you have with your health care provider. °  °Document Released: 09/21/2010 Document Revised: 12/14/2014 Document Reviewed: 06/01/2014 °Elsevier Interactive Patient Education ©2016 [employer] ° °Upper Respiratory Infection, Adult °Most upper respiratory infections (URIs) are a viral infection of the air passages leading to the lungs. A URI affects the nose, throat, and upper air passages. The most common type of URI is nasopharyngitis and is typically referred to as "the common cold." °URIs run their course and usually go away on their own. Most of the time, a URI does not require medical attention, but sometimes a bacterial infection in the upper airways can follow a viral infection. This is called a secondary infection. Sinus and middle ear infections are common types of secondary upper respiratory infections. °Bacterial pneumonia can also complicate a URI. A URI can worsen asthma and chronic obstructive pulmonary disease (COPD). Sometimes, these complications can require emergency medical care and may be life threatening.  °CAUSES °Almost all URIs are caused by viruses. A virus is a type of germ and can spread from one   person to another.  °RISKS FACTORS °You may be at risk for a URI if:  °· You smoke.   °· You have chronic heart or lung disease. °· You have a weakened defense (immune) system.   °· You are very young or very old.   °· You have nasal allergies or asthma. °· You work in crowded or poorly ventilated areas. °· You work in health care facilities or schools. °SIGNS AND SYMPTOMS    °Symptoms typically develop 2-3 days after you come in contact with a cold virus. Most viral URIs last 7-10 days. However, viral URIs from the influenza virus (flu virus) can last 14-18 days and are typically more severe. Symptoms may include:  °· Runny or stuffy (congested) nose.   °· Sneezing.   °· Cough.   °· Sore throat.   °· Headache.   °· Fatigue.   °· Fever.   °· Loss of appetite.   °· Pain in your forehead, behind your eyes, and over your cheekbones (sinus pain). °· Muscle aches.   °DIAGNOSIS  °Your health care provider may diagnose a URI by: °· Physical exam. °· Tests to check that your symptoms are not due to another condition such as: °¨ Strep throat. °¨ Sinusitis. °¨ Pneumonia. °¨ Asthma. °TREATMENT  °A URI goes away on its own with time. It cannot be cured with medicines, but medicines may be prescribed or recommended to relieve symptoms. Medicines may help: °· Reduce your fever. °· Reduce your cough. °· Relieve nasal congestion. °HOME CARE INSTRUCTIONS  °· Take medicines only as directed by your health care provider.   °· Gargle warm saltwater or take cough drops to comfort your throat as directed by your health care provider. °· Use a warm mist humidifier or inhale steam from a shower to increase air moisture. This may make it easier to breathe. °· Drink enough fluid to keep your urine clear or pale yellow.   °· Eat soups and other clear broths and maintain good nutrition.   °· Rest as needed.   °· Return to work when your temperature has returned to normal or as your health care provider advises. You may need to stay home longer to avoid infecting others. You can also use a face mask and careful hand washing to prevent spread of the virus. °· Increase the usage of your inhaler if you have asthma.   °· Do not use any tobacco products, including cigarettes, chewing tobacco, or electronic cigarettes. If you need help quitting, ask your health care provider. °PREVENTION  °The best way to protect  yourself from getting a cold is to practice good hygiene.  °· Avoid oral or hand contact with people with cold symptoms.   °· Wash your hands often if contact occurs.   °There is no clear evidence that vitamin C, vitamin E, echinacea, or exercise reduces the chance of developing a cold. However, it is always recommended to get plenty of rest, exercise, and practice good nutrition.  °SEEK MEDICAL CARE IF:  °· You are getting worse rather than better.   °· Your symptoms are not controlled by medicine.   °· You have chills. °· You have worsening shortness of breath. °· You have brown or red mucus. °· You have yellow or brown nasal discharge. °· You have pain in your face, especially when you bend forward. °· You have a fever. °· You have swollen neck glands. °· You have pain while swallowing. °· You have white areas in the back of your throat. °SEEK IMMEDIATE MEDICAL CARE IF:  °· You have severe or persistent: °¨ Headache. °¨ Ear pain. °¨   Sinus pain. °¨ Chest pain. °· You have chronic lung disease and any of the following: °¨ Wheezing. °¨ Prolonged cough. °¨ Coughing up blood. °¨ A change in your usual mucus. °· You have a stiff neck. °· You have changes in your: °¨ Vision. °¨ Hearing. °¨ Thinking. °¨ Mood. °MAKE SURE YOU:  °· Understand these instructions. °· Will watch your condition. °· Will get help right away if you are not doing well or get worse. °  °This information is not intended to replace advice given to you by your health care provider. Make sure you discuss any questions you have with your health care provider. °  °Document Released: 09/18/2000 Document Revised: 08/09/2014 Document Reviewed: 06/30/2013 °Elsevier Interactive Patient Education ©2016 [employer] ° °

## 2015-09-24 ENCOUNTER — Encounter (HOSPITAL_COMMUNITY): Payer: Self-pay | Admitting: *Deleted

## 2015-09-24 ENCOUNTER — Ambulatory Visit (HOSPITAL_COMMUNITY)
Admission: EM | Admit: 2015-09-24 | Discharge: 2015-09-24 | Disposition: A | Discharge status: Home / Self Care | Payer: Medicaid Other | Attending: Family Medicine | Admitting: Family Medicine

## 2015-09-24 DIAGNOSIS — Z136 Encounter for screening for cardiovascular disorders: Secondary | ICD-10-CM

## 2015-09-24 DIAGNOSIS — Z0130 Encounter for examination of blood pressure without abnormal findings: Secondary | ICD-10-CM | POA: Diagnosis not present

## 2015-09-24 LAB — POCT I-STAT, CHEM 8
BUN: 8 mg/dL (ref 6–20)
CHLORIDE: 105 mmol/L (ref 101–111)
Calcium, Ion: 1.32 mmol/L — ABNORMAL HIGH (ref 1.12–1.23)
Creatinine, Ser: 0.6 mg/dL (ref 0.44–1.00)
Glucose, Bld: 104 mg/dL — ABNORMAL HIGH (ref 65–99)
HEMATOCRIT: 37 % (ref 36.0–46.0)
Hemoglobin: 12.6 g/dL (ref 12.0–15.0)
POTASSIUM: 4 mmol/L (ref 3.5–5.1)
SODIUM: 139 mmol/L (ref 135–145)
TCO2: 27 mmol/L (ref 0–100)

## 2015-09-24 NOTE — Discharge Instructions (Signed)
Your blood check is normal. Continue once a day bp check , see your doctor in 2-3 weeks for re-eval Of readings.

## 2015-09-24 NOTE — ED Notes (Signed)
Pt states    She   Took  Her blood pressure  Today    At  Home  And  It  Was  Low     -     She   Reports  Her  Children were  Taking  Their  Blood  Pressure  Prior  To  That  Today

## 2015-09-24 NOTE — ED Provider Notes (Signed)
CSN: [id number]     Arrival date & time 09/24/15  1319 History   First MD Initiated Contact with Patient 09/24/15 1405     Chief Complaint  Patient presents with  . Blood Pressure Check   (Consider location/radiation/quality/duration/timing/severity/associated sxs/prior Treatment) Patient is a 35 y.o. female presenting with hypertension. The history is provided by the patient.  Hypertension This is a recurrent problem. The current episode started more than 1 week ago (pt taken off bp meds 2 mo ago ,now home reading are sl low.). Pertinent negatives include no chest pain.    Past Medical History  Diagnosis Date  . History of gonorrhea   . Gestational diabetes     2012  . Hypertension     2004  . Asthma     2010-only bothers pt during bad cold  . Depression     2010-had when mother passed away  . Pregnancy induced hypertension   . Obesity    Past Surgical History  Procedure Laterality Date  . Dilation and curettage of uterus    . Cholecystectomy    . Wisdom tooth extraction     Family History  Problem Relation Age of Onset  . Hypertension Mother   . Diabetes Mother   . Cancer Mother   . Hypertension Father   . Diabetes Father   . Hyperlipidemia Father   . Arthritis    . Asthma    . Breast cancer    . Heart failure    . Congenital heart disease    . Depression    . Heart attack     Social History  Substance Use Topics  . Smoking status: Former Smoker    Types: Cigarettes    Quit date: 10/31/2010  . Smokeless tobacco: Never Used  . Alcohol Use: No   OB History    Gravida Para Term Preterm AB TAB SAB Ectopic Multiple Living   0 4 0 4 0 0 6     Review of Systems  Constitutional: Negative.   HENT: Negative.   Respiratory: Negative.   Cardiovascular: Negative.  Negative for chest pain.    Allergies  Bee venom; Lisinopril; and Shellfish allergy  Home Medications   Prior to Admission medications   Medication Sig Start Date End Date Taking?  Authorizing Provider  acetaminophen (TYLENOL) 500 MG tablet Take 1,000 mg by mouth every 6 (six) hours as needed for moderate pain. Reported on 09/08/2015    Historical Provider, MD  albuterol (PROVENTIL HFA;VENTOLIN HFA) 108 (90 Base) MCG/ACT inhaler Inhale 2 puffs into the lungs every 6 (six) hours as needed for wheezing or shortness of breath. 07/26/15   Linna Hoff, MD  doxycycline (VIBRAMYCIN) 100 MG capsule Take 1 capsule (100 mg total) by mouth 2 (two) times daily. 09/14/15   Tharon Aquas, PA  ergocalciferol (VITAMIN D2) 50000 units capsule Take 1 capsule (50,000 Units total) by mouth once a week. 08/18/15   Jaclyn Shaggy, MD  HYDROcodone-homatropine (HYCODAN) 5-1.5 MG/5ML syrup Take 5 mLs by mouth every 8 (eight) hours as needed for cough. 09/08/15   Jaclyn Shaggy, MD  ibuprofen (ADVIL,MOTRIN) 800 MG tablet Take 1 tablet (800 mg total) by mouth every 8 (eight) hours as needed. Patient not taking: Reported on 09/08/2015 09/01/15   Anders Simmonds, PA-C  medroxyPROGESTERone (DEPO-PROVERA) 150 MG/ML injection Inject 1 mL (150 mg total) into the muscle every 3 (three) months. 02/27/15   Rachelle A Denney, CNM  penicillin v potassium (VEETID)  500 MG tablet Take 1 tablet (500 mg total) by mouth 3 (three) times daily. Patient taking differently: Take 500 mg by mouth 3 (three) times daily. Not taking as directed 09/01/15   Anders SimmondsAngela M McClung, PA-C  predniSONE (DELTASONE) 20 MG tablet Take 1 tablet (20 mg total) by mouth 2 (two) times daily with a meal. 09/08/15   Jaclyn ShaggyEnobong Amao, MD   Meds Ordered and Administered this Visit  Medications - No data to display  BP 131/94 mmHg  Pulse 75  Temp(Src) 98.2 F (36.8 C) (Oral)  Resp 16  SpO2 98% No data found.   Physical Exam  Constitutional: She is oriented to person, place, and time. She appears well-developed and well-nourished.  HENT:  Mouth/Throat: Oropharynx is clear and moist.  Neck: Normal range of motion. Neck supple.  Cardiovascular: Normal rate,  regular rhythm, normal heart sounds and intact distal pulses.   Pulmonary/Chest: Effort normal and breath sounds normal.  Lymphadenopathy:    She has no cervical adenopathy.  Neurological: She is alert and oriented to person, place, and time.  Skin: Skin is warm and dry.  Nursing note and vitals reviewed.   ED Course  Procedures (including critical care time)  Labs Review Labs Reviewed  POCT I-STAT, CHEM 8 - Abnormal; Notable for the following:    Glucose, Bld 104 (*)    Calcium, Ion 1.32 (*)    All other components within normal limits    Imaging Review No results found.   Visual Acuity Review  Right Eye Distance:   Left Eye Distance:   Bilateral Distance:    Right Eye Near:   Left Eye Near:    Bilateral Near:         MDM   1. Blood pressure check       Linna HoffJames D Machael Raine, MD 09/24/15 [phone]

## 2015-10-04 ENCOUNTER — Ambulatory Visit (HOSPITAL_COMMUNITY)
Admission: EM | Admit: 2015-10-04 | Discharge: 2015-10-04 | Disposition: A | Discharge status: Home / Self Care | Payer: Medicaid Other | Attending: Family Medicine | Admitting: Family Medicine

## 2015-10-04 ENCOUNTER — Encounter (HOSPITAL_COMMUNITY): Payer: Self-pay | Admitting: Emergency Medicine

## 2015-10-04 DIAGNOSIS — K2970 Gastritis, unspecified, without bleeding: Secondary | ICD-10-CM | POA: Diagnosis not present

## 2015-10-04 DIAGNOSIS — R0789 Other chest pain: Secondary | ICD-10-CM

## 2015-10-04 DIAGNOSIS — M940 Chondrocostal junction syndrome [Tietze]: Secondary | ICD-10-CM

## 2015-10-04 DIAGNOSIS — K5901 Slow transit constipation: Secondary | ICD-10-CM | POA: Diagnosis not present

## 2015-10-04 DIAGNOSIS — R1013 Epigastric pain: Secondary | ICD-10-CM

## 2015-10-04 MED ORDER — FAMOTIDINE 20 MG PO TABS
20.0000 mg | ORAL_TABLET | Freq: Once | ORAL | Status: AC
  Administered 2015-10-04: 20 mg via ORAL

## 2015-10-04 MED ORDER — GI COCKTAIL ~~LOC~~
30.0000 mL | Freq: Once | ORAL | Status: AC
  Administered 2015-10-04: 30 mL via ORAL

## 2015-10-04 MED ORDER — OMEPRAZOLE 20 MG PO CPDR: 20.0000 mg | DELAYED_RELEASE_CAPSULE | Freq: Every day | ORAL | Status: DC

## 2015-10-04 MED ORDER — GI COCKTAIL ~~LOC~~
ORAL | Status: AC
  Filled 2015-10-04: qty 30

## 2015-10-04 MED ORDER — FAMOTIDINE 20 MG PO TABS
ORAL_TABLET | ORAL | Status: AC
  Filled 2015-10-04: qty 1

## 2015-10-04 MED ORDER — RANITIDINE HCL 150 MG PO CAPS: 150.0000 mg | ORAL_CAPSULE | Freq: Two times a day (BID) | ORAL | Status: DC

## 2015-10-04 NOTE — ED Notes (Signed)
Patient has concerns for sharp pain that woke patient from sleep, last night.  Patient felt center epigastric pain that was described as tight, felt like gas, and noted "rumbling" in abdomen.  Patient has also noted left shoulder blade pain that radiates to front of body and is made worse by movement.  Reports last bm today and normal.

## 2015-10-04 NOTE — ED Provider Notes (Signed)
CSN: [id number]     Arrival date & time 10/04/15  1837 History   First MD Initiated Contact with Patient 10/04/15 1923     Chief Complaint  Patient presents with  . Abdominal Pain   (Consider location/radiation/quality/duration/timing/severity/associated sxs/prior Treatment) HPI Comments: 35 year old morbidly obese female complaining of epigastric pain and bloating and tightness and nausea for 3 days. It was worse last evening. She has had more belching and passing more gas in the past few days. The pain radiates to the low to mid back. She is also complaining of a sharp shooting left chest pain. She has previously been diagnosed with chest wall pain and states this feels the same. She also admits a change in diet or as she was eating better to lose weight but in the past few days she has been eating more Pasta and cheese. She has also been taking medication that includes vinegar for her stomach pain since chest today. She complains of pain in the epigastrium and across her upper abdomen that tends to migrate. She had a bowel movement yesterday but not today.  She is also complaining of some left lower extremity heavy feeling when walking. She states her leg feels like it is swollen. 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   Past Medical History  Diagnosis Date  . History of gonorrhea   . Gestational diabetes     2012  . Hypertension     2004  . Asthma     2010-only bothers pt during bad cold  . Depression     2010-had when mother passed away  . Pregnancy induced hypertension   . Obesity    Past Surgical History  Procedure Laterality Date  . Dilation and curettage of uterus    . Cholecystectomy    . Wisdom tooth extraction     Family History  Problem Relation Age of Onset  . Hypertension Mother   . Diabetes Mother   . Cancer Mother   . Hypertension Father   . Diabetes Father   . Hyperlipidemia Father   . Arthritis    . Asthma    . Breast cancer    .  Heart failure    . Congenital heart disease    . Depression    . Heart attack     Social History  Substance Use Topics  . Smoking status: Former Smoker    Types: Cigarettes    Quit date: 10/31/2010  . Smokeless tobacco: Never Used  . Alcohol Use: No   OB History    Gravida Para Term Preterm AB TAB SAB Ectopic Multiple Living   11 7 7  0 4 0 4 0 0 6     Review of Systems  Constitutional: Negative for fever, chills, activity change and fatigue.  HENT: Negative.   Respiratory: Negative.  Negative for cough and shortness of breath.   Cardiovascular: Positive for chest pain and leg swelling. Negative for palpitations.  Gastrointestinal: Positive for nausea, abdominal pain and constipation. Negative for vomiting.  Genitourinary: Negative.   Skin: Negative.   Neurological: Negative.     Allergies  Bee venom; Lisinopril; and Shellfish allergy  Home Medications   Prior to Admission medications   Medication Sig Start Date End Date Taking? Authorizing Provider  acetaminophen (TYLENOL) 500 MG tablet Take 1,000 mg by mouth every 6 (six) hours as needed for moderate pain. Reported on 09/08/2015    Historical Provider, MD  albuterol (PROVENTIL HFA;VENTOLIN HFA) 108 (90  Base) MCG/ACT inhaler Inhale 2 puffs into the lungs every 6 (six) hours as needed for wheezing or shortness of breath. 07/26/15   Linna HoffJames D Kindl, MD  doxycycline (VIBRAMYCIN) 100 MG capsule Take 1 capsule (100 mg total) by mouth 2 (two) times daily. 09/14/15   Tharon AquasFrank C Patrick, PA  ergocalciferol (VITAMIN D2) 50000 units capsule Take 1 capsule (50,000 Units total) by mouth once a week. 08/18/15   Jaclyn ShaggyEnobong Amao, MD  HYDROcodone-homatropine (HYCODAN) 5-1.5 MG/5ML syrup Take 5 mLs by mouth every 8 (eight) hours as needed for cough. 09/08/15   Jaclyn ShaggyEnobong Amao, MD  ibuprofen (ADVIL,MOTRIN) 800 MG tablet Take 1 tablet (800 mg total) by mouth every 8 (eight) hours as needed. Patient not taking: Reported on 09/08/2015 09/01/15   Anders SimmondsAngela M McClung,  PA-C  medroxyPROGESTERone (DEPO-PROVERA) 150 MG/ML injection Inject 1 mL (150 mg total) into the muscle every 3 (three) months. 02/27/15   Rachelle A Denney, CNM  omeprazole (PRILOSEC) 20 MG capsule Take 1 capsule (20 mg total) by mouth daily. 10/04/15   Hayden Rasmussenavid Taner Rzepka, NP  penicillin v potassium (VEETID) 500 MG tablet Take 1 tablet (500 mg total) by mouth 3 (three) times daily. Patient taking differently: Take 500 mg by mouth 3 (three) times daily. Not taking as directed 09/01/15   Anders SimmondsAngela M McClung, PA-C  predniSONE (DELTASONE) 20 MG tablet Take 1 tablet (20 mg total) by mouth 2 (two) times daily with a meal. 09/08/15   Jaclyn ShaggyEnobong Amao, MD  ranitidine (ZANTAC) 150 MG capsule Take 1 capsule (150 mg total) by mouth 2 (two) times daily. 10/04/15   Hayden Rasmussenavid Alby Schwabe, NP   Meds Ordered and Administered this Visit   Medications  gi cocktail (Maalox,Lidocaine,Donnatal) (30 mLs Oral Given 10/04/15 2024)  famotidine (PEPCID) tablet 20 mg (20 mg Oral Given 10/04/15 2024)    BP 131/79 mmHg  Pulse 78  Temp(Src) 98.3 F (36.8 C) (Oral)  Resp 22  SpO2 100% No data found.   Physical Exam  Constitutional: She is oriented to person, place, and time. She appears well-developed and well-nourished. No distress.  Eyes: EOM are normal.  Neck: Normal range of motion. Neck supple.  Cardiovascular: Normal rate, regular rhythm, normal heart sounds and intact distal pulses.   Pulmonary/Chest: Effort normal and breath sounds normal. No respiratory distress. She has no wheezes. She has no rales. She exhibits tenderness.  Marked reproducible chest wall tenderness with palpation to the left anterior chest and left upper sternal border. This pain produced with palpation is the same chest pain for which she has been having intermittently for several months.  Abdominal: Soft. She exhibits no distension and no mass. There is no guarding.  Tenderness to the epigastrium. No tenderness to the remainder of the abdomen. Percussion tympanic to  the epigastrium and upper most abdomen. Flat to dull remainder of the abdomen.  Musculoskeletal: She exhibits no edema.  Examination of the left leg reveals no obvious swelling. No calf tenderness or tension. No discoloration. There are superficial varicosities that been there for a while. Pedal pulse 2+. Normal color and warmth. No tenderness to the back musculature or spine.  Neurological: She is alert and oriented to person, place, and time. She exhibits normal muscle tone. Coordination normal.  Skin: Skin is warm and dry.  Psychiatric: She has a normal mood and affect. Her behavior is normal.  Nursing note and vitals reviewed.   ED Course  Procedures (including critical care time)  Labs Review Labs Reviewed - No data to display  Imaging Review No results found.   Visual Acuity Review  Right Eye Distance:   Left Eye Distance:   Bilateral Distance:    Right Eye Near:   Left Eye Near:    Bilateral Near:         MDM   1. Gastritis   2. Epigastric pain   3. Slow transit constipation   4. Chest wall pain   5. Costochondritis    Meds ordered this encounter  Medications  . gi cocktail (Maalox,Lidocaine,Donnatal)    Sig:   . famotidine (PEPCID) tablet 20 mg    Sig:   . omeprazole (PRILOSEC) 20 MG capsule    Sig: Take 1 capsule (20 mg total) by mouth daily.    Dispense:  30 capsule    Refill:  0    Order Specific Question:  Supervising Provider    Answer:  Linna Hoff [phone]  . ranitidine (ZANTAC) 150 MG capsule    Sig: Take 1 capsule (150 mg total) by mouth 2 (two) times daily.    Dispense:  30 capsule    Refill:  0    Order Specific Question:  Supervising Provider    Answer:  Linna Hoff [phone]   After taking the GI cocktail and Pepcid the patient states her stomach is feeling better. She is smiling more and seems to be less anxious. There is no shortness of breath. No current chest pain. The chest pain she is having is chest wall pain. It is easily  reproduced with tenderness to palpation as described above. Localized packs for comfort and inflammation. Abdominal pain due to accommodation of gastritis and diet. Also constipation, treat with below management descriptors. No acute abdomen or peritoneal signs.  Currently there is no objective evidence of DVT in the left leg. The discomfort is described is very mild and intermittent. The patient feels like her leg is heavy or swollen but there is no apparent swelling. Normal exam of the lower extremities.  Miralax 17 gm in liquid every 1 to 2 hours times 3 or 4 glasses until has good BM's  In addition to taking the omeprazole and Zantac take simethicone for gas  No NSAIDs or prednisone. No vinegar. Increase the fiber in her diet. Increase water intake. Ice to the chest wall whether his pain. f you develop swelling in your left leg along with pain in her calves, discoloration, pain with walking and tenderness tear Muscle go to the emergency department promptly. In the meantime apply heat to the Muscle to the stretches as demonstrated and wear the compression stockings as discussed.  Hayden Rasmussen, NP 10/04/15 [phone]

## 2015-10-04 NOTE — Discharge Instructions (Signed)
If you develop swelling in your left leg along with pain in her calves, discoloration, pain with walking and tenderness tear Muscle go to the emergency department promptly. In the meantime apply heat to the Muscle to the stretches as demonstrated and wear the compression stockings as discussed.   Chest Wall Pain Chest wall pain is pain in or around the bones and muscles of your chest. Sometimes, an injury causes this pain. Sometimes, the cause may not be known. This pain may take several weeks or longer to get better. HOME CARE Pay attention to any changes in your symptoms. Take these actions to help with your pain:  Rest as told by your doctor.  Avoid activities that cause pain. Try not to use your chest, belly (abdominal), or side muscles to lift heavy things.  If directed, apply ice to the painful area:  Put ice in a plastic bag.  Place a towel between your skin and the bag.  Leave the ice on for 20 minutes, 2-3 times per day.  Take over-the-counter and prescription medicines only as told by your doctor.  Do not use tobacco products, including cigarettes, chewing tobacco, and e-cigarettes. If you need help quitting, ask your doctor.  Keep all follow-up visits as told by your doctor. This is important. GET HELP IF:  You have a fever.  Your chest pain gets worse.  You have new symptoms. GET HELP RIGHT AWAY IF:  You feel sick to your stomach (nauseous) or you throw up (vomit).  You feel sweaty or light-headed.  You have a cough with phlegm (sputum) or you cough up blood.  You are short of breath.   This information is not intended to replace advice given to you by your health care provider. Make sure you discuss any questions you have with your health care provider.   Document Released: 09/11/2007 Document Revised: 12/14/2014 Document Reviewed: 06/20/2014 Elsevier Interactive Patient Education 2016 [employer]  Constipation, Adult Miralax 17 gm in liquid every 1 to  2 hours times 3 or 4 glasses until has good BM's Constipation is when a person:  Poops (has a bowel movement) less than 3 times a week.  Has a hard time pooping.  Has poop that is dry, hard, or bigger than normal. HOME CARE   Eat foods with a lot of fiber in them. This includes fruits, vegetables, beans, and whole grains such as brown rice.  Avoid fatty foods and foods with a lot of sugar. This includes french fries, hamburgers, cookies, candy, and soda.  If you are not getting enough fiber from food, take products with added fiber in them (supplements).  Drink enough fluid to keep your pee (urine) clear or pale yellow.  Exercise on a regular basis, or as told by your doctor.  Go to the restroom when you feel like you need to poop. Do not hold it.  Only take medicine as told by your doctor. Do not take medicines that help you poop (laxatives) without talking to your doctor first. GET HELP RIGHT AWAY IF:   You have bright red blood in your poop (stool).  Your constipation lasts more than 4 days or gets worse.  You have belly (abdominal) or butt (rectal) pain.  You have thin poop (as thin as a pencil).  You lose weight, and it cannot be explained. MAKE SURE YOU:   Understand these instructions.  Will watch your condition.  Will get help right away if you are not doing well or get  worse.   This information is not intended to replace advice given to you by your health care provider. Make sure you discuss any questions you have with your health care provider.   Document Released: 09/11/2007 Document Revised: 04/15/2014 Document Reviewed: 01/04/2013 Elsevier Interactive Patient Education 2016 [employer]  Costochondritis Costochondritis is a condition in which the tissue (cartilage) that connects your ribs with your breastbone (sternum) becomes irritated. It causes pain in the chest and rib area. It usually goes away on its own over time. HOME CARE  Avoid activities  that wear you out.  Do not strain your ribs. Avoid activities that use your:  Chest.  Belly.  Side muscles.  Put ice on the area for the first 2 days after the pain starts.  Put ice in a plastic bag.  Place a towel between your skin and the bag.  Leave the ice on for 20 minutes, 2-3 times a day.  Only take medicine as told by your doctor. GET HELP IF:  You have redness or puffiness (swelling) in the rib area.  Your pain does not go away with rest or medicine. GET HELP RIGHT AWAY IF:   Your pain gets worse.  You are very uncomfortable.  You have trouble breathing.  You cough up blood.  You start sweating or throwing up (vomiting).  You have a fever or lasting symptoms for more than 2-3 days.  You have a fever and your symptoms suddenly get worse. MAKE SURE YOU:   Understand these instructions.  Will watch your condition.  Will get help right away if you are not doing well or get worse.   This information is not intended to replace advice given to you by your health care provider. Make sure you discuss any questions you have with your health care provider.   Document Released: 09/11/2007 Document Revised: 11/25/2012 Document Reviewed: 10/27/2012 Elsevier Interactive Patient Education 2016 [employer] Inc[employer]  Gastritis, Adult In addition to taking the omeprazole and Zantac take simethicone for gas Gastritis is soreness and puffiness (inflammation) of the lining of the stomach. If you do not get help, gastritis can cause bleeding and sores (ulcers) in the stomach. HOME CARE   Only take medicine as told by your doctor.  If you were given antibiotic medicines, take them as told. Finish the medicines even if you start to feel better.  Drink enough fluids to keep your pee (urine) clear or pale yellow.  Avoid foods and drinks that make your problems worse. Foods you may want to avoid include:  Caffeine or alcohol.  Chocolate.  Mint.  Garlic and  onions.  Spicy foods.  Citrus fruits, including oranges, lemons, or limes.  Food containing tomatoes, including sauce, chili, salsa, and pizza.  Fried and fatty foods.  Eat small meals throughout the day instead of large meals. GET HELP RIGHT AWAY IF:   You have black or dark red poop (stools).  You throw up (vomit) blood. It may look like coffee grounds.  You cannot keep fluids down.  Your belly (abdominal) pain gets worse.  You have a fever.  You do not feel better after 1 week.  You have any other questions or concerns. MAKE SURE YOU:   Understand these instructions.  Will watch your condition.  Will get help right away if you are not doing well or get worse.   This information is not intended to replace advice given to you by your health care provider. Make sure you discuss any questions  you have with your health care provider.   Document Released: 09/11/2007 Document Revised: 06/17/2011 Document Reviewed: 05/08/2011 Elsevier Interactive Patient Education [employer]

## 2015-10-19 ENCOUNTER — Ambulatory Visit (HOSPITAL_COMMUNITY)
Admission: EM | Admit: 2015-10-19 | Discharge: 2015-10-19 | Disposition: A | Discharge status: Home / Self Care | Payer: Medicaid Other | Attending: Family Medicine | Admitting: Family Medicine

## 2015-10-19 ENCOUNTER — Encounter (HOSPITAL_COMMUNITY): Payer: Self-pay | Admitting: Emergency Medicine

## 2015-10-19 DIAGNOSIS — K0889 Other specified disorders of teeth and supporting structures: Secondary | ICD-10-CM

## 2015-10-19 MED ORDER — OXYCODONE-ACETAMINOPHEN 5-325 MG PO TABS: 1.0000 | ORAL_TABLET | Freq: Four times a day (QID) | ORAL | Status: DC | PRN

## 2015-10-19 MED ORDER — CLINDAMYCIN HCL 150 MG PO CAPS: 150.0000 mg | ORAL_CAPSULE | Freq: Four times a day (QID) | ORAL | Status: DC

## 2015-10-19 NOTE — ED Notes (Signed)
PT had teeth pulled Monday and has mouth pain. PT tried to see her dentist today, but they couldn't see her. PT's dentist is closed tomorrow. They told her they cannot call anything in. PT was on antibiotics prior to teeth being pulled. PT has been taking Vicodin every 4 hours.

## 2015-10-19 NOTE — Discharge Instructions (Signed)
Take medicine as prescribed, see your dentist as soon as possible °

## 2015-10-19 NOTE — ED Provider Notes (Signed)
CSN: [id number]     Arrival date & time 10/19/15  1906 History   First MD Initiated Contact with Patient 10/19/15 2016     Chief Complaint  Patient presents with  . Dental Pain   (Consider location/radiation/quality/duration/timing/severity/associated sxs/prior Treatment) Patient is a 35 y.o. female presenting with tooth pain. The history is provided by the patient.  Dental Pain Location:  Lower Lower teeth location:  17/LL 3rd molar, 18/LL 2nd molar and 32/RL 3rd molar Quality:  Aching and throbbing   Past Medical History  Diagnosis Date  . History of gonorrhea   . Gestational diabetes     2012  . Hypertension     2004  . Asthma     2010-only bothers pt during bad cold  . Depression     2010-had when mother passed away  . Pregnancy induced hypertension   . Obesity    Past Surgical History  Procedure Laterality Date  . Dilation and curettage of uterus    . Cholecystectomy    . Wisdom tooth extraction     Family History  Problem Relation Age of Onset  . Hypertension Mother   . Diabetes Mother   . Cancer Mother   . Hypertension Father   . Diabetes Father   . Hyperlipidemia Father   . Arthritis    . Asthma    . Breast cancer    . Heart failure    . Congenital heart disease    . Depression    . Heart attack     Social History  Substance Use Topics  . Smoking status: Former Smoker    Types: Cigarettes    Quit date: 10/31/2010  . Smokeless tobacco: Never Used  . Alcohol Use: No   OB History    Gravida Para Term Preterm AB TAB SAB Ectopic Multiple Living   0 4 0 4 0 0 6     Review of Systems  Constitutional: Negative.   HENT: Positive for dental problem.   All other systems reviewed and are negative.   Allergies  Bee venom; Lisinopril; and Shellfish allergy  Home Medications   Prior to Admission medications   Medication Sig Start Date End Date Taking? Authorizing Provider  acetaminophen (TYLENOL) 500 MG tablet Take 1,000 mg by mouth every 6  (six) hours as needed for moderate pain. Reported on 09/08/2015    Historical Provider, MD  albuterol (PROVENTIL HFA;VENTOLIN HFA) 108 (90 Base) MCG/ACT inhaler Inhale 2 puffs into the lungs every 6 (six) hours as needed for wheezing or shortness of breath. 07/26/15   Linna Hoff, MD  clindamycin (CLEOCIN) 150 MG capsule Take 1 capsule (150 mg total) by mouth 4 (four) times daily. 10/19/15   Linna Hoff, MD  doxycycline (VIBRAMYCIN) 100 MG capsule Take 1 capsule (100 mg total) by mouth 2 (two) times daily. 09/14/15   Tharon Aquas, PA  ergocalciferol (VITAMIN D2) 50000 units capsule Take 1 capsule (50,000 Units total) by mouth once a week. 08/18/15   Jaclyn Shaggy, MD  HYDROcodone-homatropine (HYCODAN) 5-1.5 MG/5ML syrup Take 5 mLs by mouth every 8 (eight) hours as needed for cough. 09/08/15   Jaclyn Shaggy, MD  ibuprofen (ADVIL,MOTRIN) 800 MG tablet Take 1 tablet (800 mg total) by mouth every 8 (eight) hours as needed. Patient not taking: Reported on 09/08/2015 09/01/15   Anders Simmonds, PA-C  medroxyPROGESTERone (DEPO-PROVERA) 150 MG/ML injection Inject 1 mL (150 mg total) into the muscle every 3 (three) months. 02/27/15  Rachelle A Denney, CNM  omeprazole (PRILOSEC) 20 MG capsule Take 1 capsule (20 mg total) by mouth daily. 10/04/15   Hayden Rasmussenavid Mabe, NP  oxyCODONE-acetaminophen (PERCOCET/ROXICET) 5-325 MG tablet Take 1-2 tablets by mouth every 6 (six) hours as needed for severe pain. 10/19/15   Linna HoffJames D Merdith Boyd, MD  penicillin v potassium (VEETID) 500 MG tablet Take 1 tablet (500 mg total) by mouth 3 (three) times daily. Patient taking differently: Take 500 mg by mouth 3 (three) times daily. Not taking as directed 09/01/15   Anders SimmondsAngela M McClung, PA-C  predniSONE (DELTASONE) 20 MG tablet Take 1 tablet (20 mg total) by mouth 2 (two) times daily with a meal. 09/08/15   Jaclyn ShaggyEnobong Amao, MD  ranitidine (ZANTAC) 150 MG capsule Take 1 capsule (150 mg total) by mouth 2 (two) times daily. 10/04/15   Hayden Rasmussenavid Mabe, NP   Meds Ordered  and Administered this Visit  Medications - No data to display  BP 136/72 mmHg  Pulse 62  Temp(Src) 98.9 F (37.2 C) (Oral)  Resp 16  SpO2 100%  LMP 09/27/2015 No data found.   Physical Exam  Constitutional: She is oriented to person, place, and time. She appears well-developed and well-nourished.  HENT:  Mouth/Throat: Oropharynx is clear and moist.  Eyes: Pupils are equal, round, and reactive to light.  Neck: Normal range of motion. Neck supple.  Lymphadenopathy:    She has no cervical adenopathy.  Neurological: She is alert and oriented to person, place, and time.  Skin: Skin is warm and dry.  Nursing note and vitals reviewed.   ED Course  Procedures (including critical care time)  Labs Review Labs Reviewed - No data to display  Imaging Review No results found.   Visual Acuity Review  Right Eye Distance:   Left Eye Distance:   Bilateral Distance:    Right Eye Near:   Left Eye Near:    Bilateral Near:         MDM   1. Pain, dental        Linna HoffJames D Laurence Crofford, MD 10/19/15 2031

## 2015-10-22 ENCOUNTER — Emergency Department (HOSPITAL_COMMUNITY): Payer: Medicaid Other

## 2015-10-22 ENCOUNTER — Encounter (HOSPITAL_COMMUNITY): Payer: Self-pay

## 2015-10-22 DIAGNOSIS — Z87891 Personal history of nicotine dependence: Secondary | ICD-10-CM | POA: Diagnosis not present

## 2015-10-22 DIAGNOSIS — J45909 Unspecified asthma, uncomplicated: Secondary | ICD-10-CM | POA: Diagnosis not present

## 2015-10-22 DIAGNOSIS — M79642 Pain in left hand: Secondary | ICD-10-CM | POA: Diagnosis not present

## 2015-10-22 DIAGNOSIS — Z79899 Other long term (current) drug therapy: Secondary | ICD-10-CM | POA: Diagnosis not present

## 2015-10-22 DIAGNOSIS — R200 Anesthesia of skin: Secondary | ICD-10-CM | POA: Diagnosis present

## 2015-10-22 DIAGNOSIS — K0889 Other specified disorders of teeth and supporting structures: Secondary | ICD-10-CM | POA: Insufficient documentation

## 2015-10-22 DIAGNOSIS — I10 Essential (primary) hypertension: Secondary | ICD-10-CM | POA: Insufficient documentation

## 2015-10-22 LAB — I-STAT CHEM 8, ED
BUN: 8 mg/dL (ref 6–20)
CHLORIDE: 105 mmol/L (ref 101–111)
Calcium, Ion: 1.29 mmol/L (ref 1.13–1.30)
Creatinine, Ser: 0.7 mg/dL (ref 0.44–1.00)
Glucose, Bld: 81 mg/dL (ref 65–99)
HEMATOCRIT: 37 % (ref 36.0–46.0)
Hemoglobin: 12.6 g/dL (ref 12.0–15.0)
POTASSIUM: 3.8 mmol/L (ref 3.5–5.1)
SODIUM: 142 mmol/L (ref 135–145)
TCO2: 24 mmol/L (ref 0–100)

## 2015-10-22 LAB — CBC
HCT: 36.4 % (ref 36.0–46.0)
Hemoglobin: 11.7 g/dL — ABNORMAL LOW (ref 12.0–15.0)
MCH: 26.1 pg (ref 26.0–34.0)
MCHC: 32.1 g/dL (ref 30.0–36.0)
MCV: 81.1 fL (ref 78.0–100.0)
PLATELETS: 312 10*3/uL (ref 150–400)
RBC: 4.49 MIL/uL (ref 3.87–5.11)
RDW: 13.6 % (ref 11.5–15.5)
WBC: 6 10*3/uL (ref 4.0–10.5)

## 2015-10-22 LAB — COMPREHENSIVE METABOLIC PANEL
ALT: 50 U/L (ref 14–54)
ANION GAP: 7 (ref 5–15)
AST: 27 U/L (ref 15–41)
Albumin: 3.7 g/dL (ref 3.5–5.0)
Alkaline Phosphatase: 65 U/L (ref 38–126)
BUN: 8 mg/dL (ref 6–20)
CHLORIDE: 107 mmol/L (ref 101–111)
CO2: 23 mmol/L (ref 22–32)
Calcium: 9.8 mg/dL (ref 8.9–10.3)
Creatinine, Ser: 0.7 mg/dL (ref 0.44–1.00)
GFR calc non Af Amer: >60 mL/min (ref >60)
Glucose, Bld: 85 mg/dL (ref 65–99)
Potassium: 3.7 mmol/L (ref 3.5–5.1)
SODIUM: 137 mmol/L (ref 135–145)
Total Bilirubin: 1 mg/dL (ref 0.3–1.2)
Total Protein: 7 g/dL (ref 6.5–8.1)

## 2015-10-22 LAB — DIFFERENTIAL
BASOS PCT: 0 %
Basophils Absolute: 0 10*3/uL (ref 0.0–0.1)
Eosinophils Absolute: 0.1 10*3/uL (ref 0.0–0.7)
Eosinophils Relative: 2 %
Lymphocytes Relative: 31 %
Lymphs Abs: 1.9 10*3/uL (ref 0.7–4.0)
MONO ABS: 0.2 10*3/uL (ref 0.1–1.0)
Monocytes Relative: 4 %
NEUTROS ABS: 3.8 10*3/uL (ref 1.7–7.7)
Neutrophils Relative %: 63 %

## 2015-10-22 LAB — APTT: APTT: 34 s (ref 24–37)

## 2015-10-22 LAB — I-STAT TROPONIN, ED: Troponin i, poc: 0 ng/mL (ref 0.00–0.08)

## 2015-10-22 LAB — PROTIME-INR
INR: 1.18 (ref 0.00–1.49)
PROTHROMBIN TIME: 15.2 s (ref 11.6–15.2)

## 2015-10-22 NOTE — ED Notes (Signed)
Pt here with c/o hypertension. She reports numbness/tingling in the left arm and leg for 2 days. She reports having wisdom tooth extraction on Monday. Pt also reports feeling "sluggish."

## 2015-10-23 ENCOUNTER — Emergency Department (HOSPITAL_COMMUNITY)
Admission: EM | Admit: 2015-10-23 | Discharge: 2015-10-23 | Disposition: A | Discharge status: Home / Self Care | Payer: Medicaid Other | Attending: Emergency Medicine | Admitting: Emergency Medicine

## 2015-10-23 DIAGNOSIS — K0889 Other specified disorders of teeth and supporting structures: Secondary | ICD-10-CM

## 2015-10-23 DIAGNOSIS — M79642 Pain in left hand: Secondary | ICD-10-CM

## 2015-10-23 NOTE — ED Provider Notes (Signed)
CSN: [id number]     Arrival date & time 10/22/15  1848 History  By signing my name below, I, Crystal Burns, attest that this documentation has been prepared under the direction and in the presence of Crystal Bilis, MD.  Electronically Signed: Gillis Burns. Crystal Burns, ED Scribe. 10/23/2015. 2:48 AM.    Chief Complaint  Patient presents with  . Hypertension  . Numbness   The history is provided by the patient. No language interpreter was used.    HPI Comments: Crystal Burns is a 35 y.o. female with PMHx of HTN who presents to the Emergency Department complaining of gradual onset, intermittent, radiating left hand pain to left forearm x 2 days. Pt states pain feels like "pins and needles." Pt has associated numbness of right hand. Pain is exacerbated when applying pressure to hand or clenching hand into fist. She denies any recent injury to her right hand. Pt has never had any similar episodes in the past. She reports having similar numbness in lower left extremity and states that she has "alot of broken veins" in her left leg.    Pt also complains of having dental pain in lower left area of the mouth due to having removal of 3 wisdom teeth on 10/16/15. Pt states she was taking Vicodin for dental pain with no relief. She went to Urgent Care and was given Percocet for pain with mild relief.   Past Medical History  Diagnosis Date  . History of gonorrhea   . Gestational diabetes     2012  . Hypertension     2004  . Asthma     2010-only bothers pt during bad cold  . Depression     2010-had when mother passed away  . Pregnancy induced hypertension   . Obesity    Past Surgical History  Procedure Laterality Date  . Dilation and curettage of uterus    . Cholecystectomy    . Wisdom tooth extraction     Family History  Problem Relation Age of Onset  . Hypertension Mother   . Diabetes Mother   . Cancer Mother   . Hypertension Father   . Diabetes Father   . Hyperlipidemia Father    . Arthritis    . Asthma    . Breast cancer    . Heart failure    . Congenital heart disease    . Depression    . Heart attack     Social History  Substance Use Topics  . Smoking status: Former Smoker    Types: Cigarettes    Quit date: 10/31/2010  . Smokeless tobacco: Never Used  . Alcohol Use: No   OB History    Gravida Para Term Preterm AB TAB SAB Ectopic Multiple Living   11 7 7  0 4 0 4 0 0 6     Review of Systems  HENT: Positive for dental problem.   Musculoskeletal: Positive for myalgias.  Neurological: Positive for numbness.  All other systems reviewed and are negative.  Allergies  Bee venom; Lisinopril; and Shellfish allergy  Home Medications   Prior to Admission medications   Medication Sig Start Date End Date Taking? Authorizing Provider  acetaminophen (TYLENOL) 500 MG tablet Take 1,000 mg by mouth every 6 (six) hours as needed for moderate pain. Reported on 09/08/2015    Historical Provider, MD  albuterol (PROVENTIL HFA;VENTOLIN HFA) 108 (90 Base) MCG/ACT inhaler Inhale 2 puffs into the lungs every 6 (six) hours as needed for wheezing or shortness  of breath. 07/26/15   Linna Hoff, MD  clindamycin (CLEOCIN) 150 MG capsule Take 1 capsule (150 mg total) by mouth 4 (four) times daily. 10/19/15   Linna Hoff, MD  doxycycline (VIBRAMYCIN) 100 MG capsule Take 1 capsule (100 mg total) by mouth 2 (two) times daily. 09/14/15   Tharon Aquas, PA  ergocalciferol (VITAMIN D2) 50000 units capsule Take 1 capsule (50,000 Units total) by mouth once a week. 08/18/15   Jaclyn Shaggy, MD  HYDROcodone-homatropine (HYCODAN) 5-1.5 MG/5ML syrup Take 5 mLs by mouth every 8 (eight) hours as needed for cough. 09/08/15   Jaclyn Shaggy, MD  ibuprofen (ADVIL,MOTRIN) 800 MG tablet Take 1 tablet (800 mg total) by mouth every 8 (eight) hours as needed. Patient not taking: Reported on 09/08/2015 09/01/15   Anders Simmonds, PA-C  medroxyPROGESTERone (DEPO-PROVERA) 150 MG/ML injection Inject 1 mL (150 mg  total) into the muscle every 3 (three) months. 02/27/15   Rachelle A Denney, CNM  omeprazole (PRILOSEC) 20 MG capsule Take 1 capsule (20 mg total) by mouth daily. 10/04/15   Hayden Rasmussen, NP  oxyCODONE-acetaminophen (PERCOCET/ROXICET) 5-325 MG tablet Take 1-2 tablets by mouth every 6 (six) hours as needed for severe pain. 10/19/15   Linna Hoff, MD  penicillin v potassium (VEETID) 500 MG tablet Take 1 tablet (500 mg total) by mouth 3 (three) times daily. Patient taking differently: Take 500 mg by mouth 3 (three) times daily. Not taking as directed 09/01/15   Anders Simmonds, PA-C  predniSONE (DELTASONE) 20 MG tablet Take 1 tablet (20 mg total) by mouth 2 (two) times daily with a meal. 09/08/15   Jaclyn Shaggy, MD  ranitidine (ZANTAC) 150 MG capsule Take 1 capsule (150 mg total) by mouth 2 (two) times daily. 10/04/15   Hayden Rasmussen, NP   BP 136/83 mmHg  Pulse 70  Temp(Src) 98.9 F (37.2 C) (Oral)  Resp 15  Ht  (1.6 m)  Wt 250 lb 6.4 oz (113.581 kg)  BMI 44.37 kg/m2  SpO2 100%  LMP 10/20/2015 (Exact Date) Physical Exam  Constitutional: She is oriented to person, place, and time. She appears well-developed and well-nourished. No distress.  HENT:  Head: Normocephalic and atraumatic.  Left lower dental extraction sites without signs of infection. Both wounds are impacted with food. No left side facial swelling.   Eyes: EOM are normal.  Neck: Normal range of motion.  Cardiovascular: Normal rate, regular rhythm, normal heart sounds and intact distal pulses.   PT/DP pulses intact bilaterally Normal left radial pulse  Pulmonary/Chest: Effort normal and breath sounds normal.  Abdominal: Soft. She exhibits no distension. There is no tenderness.  Musculoskeletal: Normal range of motion. She exhibits tenderness.  Varicose veins in left anterior tibia with no surrounding erythema Left hand tenderness to thanar eminence  Normal ROM of left thumb   Neurological: She is alert and oriented to person,  place, and time.  Skin: Skin is warm and dry.  Psychiatric: She has a normal mood and affect. Judgment normal.  Nursing note and vitals reviewed.   ED Course  Procedures (including critical care time) DIAGNOSTIC STUDIES: Oxygen Saturation is 100% on RA, normal by my interpretation.    Labs Review Labs Reviewed  CBC - Abnormal; Notable for the following:    Hemoglobin 11.7 (*)    All other components within normal limits  PROTIME-INR  APTT  DIFFERENTIAL  COMPREHENSIVE METABOLIC PANEL  I-STAT TROPOININ, ED  I-STAT CHEM 8, ED    Imaging  Review  Ct Head Wo Contrast  10/22/2015  CLINICAL DATA:  Left arm tingling since yesterday. EXAM: CT HEAD WITHOUT CONTRAST TECHNIQUE: Contiguous axial images were obtained from the base of the skull through the vertex without intravenous contrast. COMPARISON:  None available (05/25/1999) FINDINGS: Brain: Unremarkable. No evidence of infarction, hemorrhage, hydrocephalus, or mass lesion/mass effect. Vascular: No hyperdense vessel or unexpected calcification. Skull: Negative for fracture or focal lesion. Sinuses/Orbits: No acute finding. Other: None. IMPRESSION: Negative exam. Electronically Signed   By: Marnee SpringJonathon  Watts M.D.   On: 10/22/2015 21:56   I have personally reviewed and evaluated these images and lab results as part of my medical decision-making.   MDM   Final diagnoses:  Pain, dental  Left hand pain   Patient is overall well-appearing.  Nonspecific complaints.  Patient has a multitude of complaints.  I think it is best that she is managed by her primary care physician.  Medical screening examination completed.  Workup in emergency department is extensive and without abnormality    I personally performed the services described in this documentation, which was scribed in my presence. The recorded information has been reviewed and is accurate.       Crystal BilisKevin Oralee Rapaport, MD 10/24/15 [phone]

## 2015-10-23 NOTE — ED Notes (Addendum)
Patient reports experiencing intermittent pins/needles in L hand x 2 days. Also, reports same feeling in L leg. States "I have a lot of broken veins in my leg." Denies pain or pins/needles in L hand at this time.

## 2015-10-23 NOTE — ED Notes (Signed)
Pt departed in NAD, refused use of wheelchair.  

## 2015-10-24 ENCOUNTER — Encounter (HOSPITAL_COMMUNITY): Payer: Self-pay | Admitting: Emergency Medicine

## 2015-10-24 ENCOUNTER — Ambulatory Visit (HOSPITAL_COMMUNITY)
Admission: EM | Admit: 2015-10-24 | Discharge: 2015-10-24 | Disposition: A | Discharge status: Home / Self Care | Payer: Medicaid Other | Attending: Family Medicine | Admitting: Family Medicine

## 2015-10-24 DIAGNOSIS — R030 Elevated blood-pressure reading, without diagnosis of hypertension: Secondary | ICD-10-CM

## 2015-10-24 NOTE — Discharge Instructions (Signed)
Hypertension Hypertension is another name for high blood pressure. High blood pressure forces your heart to work harder to pump blood. A blood pressure reading has two numbers, which includes a higher number over a lower number (example: 110/72). HOME CARE   Have your blood pressure rechecked by your doctor.  Only take medicine as told by your doctor. Follow the directions carefully. The medicine does not work as well if you skip doses. Skipping doses also puts you at risk for problems.  Do not smoke.  Monitor your blood pressure at home as told by your doctor. GET HELP IF:  You think you are having a reaction to the medicine you are taking.  You have repeat headaches or feel dizzy.  You have puffiness (swelling) in your ankles.  You have trouble with your vision. GET HELP RIGHT AWAY IF:   You get a very bad headache and are confused.  You feel weak, numb, or faint.  You get chest or belly (abdominal) pain.  You throw up (vomit).  You cannot breathe very well. MAKE SURE YOU:   Understand these instructions.  Will watch your condition.  Will get help right away if you are not doing well or get worse.   This information is not intended to replace advice given to you by your health care provider. Make sure you discuss any questions you have with your health care provider.   Document Released: 09/11/2007 Document Revised: 03/30/2013 Document Reviewed: 01/15/2013 Elsevier Interactive Patient Education 2016 [employer]  - DASH Eating Plan DASH stands for "Dietary Approaches to Stop Hypertension." The DASH eating plan is a healthy eating plan that has been shown to reduce high blood pressure (hypertension). Additional health benefits may include reducing the risk of type 2 diabetes mellitus, heart disease, and stroke. The DASH eating plan may also help with weight loss. WHAT DO I NEED TO KNOW ABOUT THE DASH EATING PLAN? For the DASH eating plan, you will follow these  general guidelines:  Choose foods with a percent daily value for sodium of less than 5% (as listed on the food label).  Use salt-free seasonings or herbs instead of table salt or sea salt.  Check with your health care provider or pharmacist before using salt substitutes.  Eat lower-sodium products, often labeled as "lower sodium" or "no salt added."  Eat fresh foods.  Eat more vegetables, fruits, and low-fat dairy products.  Choose whole grains. Look for the word "whole" as the first word in the ingredient list.  Choose fish and skinless chicken or turkey more often than red meat. Limit fish, poultry, and meat to 6 oz (170 g) each day.  Limit sweets, desserts, sugars, and sugary drinks.  Choose heart-healthy fats.  Limit cheese to 1 oz (28 g) per day.  Eat more home-cooked food and less restaurant, buffet, and fast food.  Limit fried foods.  Cook foods using methods other than frying.  Limit canned vegetables. If you do use them, rinse them well to decrease the sodium.  When eating at a restaurant, ask that your food be prepared with less salt, or no salt if possible. WHAT FOODS CAN I EAT? Seek help from a dietitian for individual calorie needs. Grains Whole grain or whole wheat bread. Brown rice. Whole grain or whole wheat pasta. Quinoa, bulgur, and whole grain cereals. Low-sodium cereals. Corn or whole wheat flour tortillas. Whole grain cornbread. Whole grain crackers. Low-sodium crackers. Vegetables Fresh or frozen vegetables (raw, steamed, roasted, or grilled). Low-sodium or   reduced-sodium tomato and vegetable juices. Low-sodium or reduced-sodium tomato sauce and paste. Low-sodium or reduced-sodium canned vegetables.  Fruits All fresh, canned (in natural juice), or frozen fruits. Meat and Other Protein Products Ground beef (85% or leaner), grass-fed beef, or beef trimmed of fat. Skinless chicken or turkey. Ground chicken or turkey. Pork trimmed of fat. All fish and  seafood. Eggs. Dried beans, peas, or lentils. Unsalted nuts and seeds. Unsalted canned beans. Dairy Low-fat dairy products, such as skim or 1% milk, 2% or reduced-fat cheeses, low-fat ricotta or cottage cheese, or plain low-fat yogurt. Low-sodium or reduced-sodium cheeses. Fats and Oils Tub margarines without trans fats. Light or reduced-fat mayonnaise and salad dressings (reduced sodium). Avocado. Safflower, olive, or canola oils. Natural peanut or almond butter. Other Unsalted popcorn and pretzels. The items listed above may not be a complete list of recommended foods or beverages. Contact your dietitian for more options. WHAT FOODS ARE NOT RECOMMENDED? Grains White bread. White pasta. White rice. Refined cornbread. Bagels and croissants. Crackers that contain trans fat. Vegetables Creamed or fried vegetables. Vegetables in a cheese sauce. Regular canned vegetables. Regular canned tomato sauce and paste. Regular tomato and vegetable juices. Fruits Dried fruits. Canned fruit in light or heavy syrup. Fruit juice. Meat and Other Protein Products Fatty cuts of meat. Ribs, chicken wings, bacon, sausage, bologna, salami, chitterlings, fatback, hot dogs, bratwurst, and packaged luncheon meats. Salted nuts and seeds. Canned beans with salt. Dairy Whole or 2% milk, cream, half-and-half, and cream cheese. Whole-fat or sweetened yogurt. Full-fat cheeses or blue cheese. Nondairy creamers and whipped toppings. Processed cheese, cheese spreads, or cheese curds. Condiments Onion and garlic salt, seasoned salt, table salt, and sea salt. Canned and packaged gravies. Worcestershire sauce. Tartar sauce. Barbecue sauce. Teriyaki sauce. Soy sauce, including reduced sodium. Steak sauce. Fish sauce. Oyster sauce. Cocktail sauce. Horseradish. Ketchup and mustard. Meat flavorings and tenderizers. Bouillon cubes. Hot sauce. Tabasco sauce. Marinades. Taco seasonings. Relishes. Fats and Oils Butter, stick margarine,  lard, shortening, ghee, and bacon fat. Coconut, palm kernel, or palm oils. Regular salad dressings. Other Pickles and olives. Salted popcorn and pretzels. The items listed above may not be a complete list of foods and beverages to avoid. Contact your dietitian for more information. WHERE CAN I FIND MORE INFORMATION? National Heart, [employer], [employer] Institute: www.nhlbi.nih.gov/health/health-topics/topics/dash/   This information is not intended to replace advice given to you by your health care provider. Make sure you discuss any questions you have with your health care provider.   Document Released: 03/14/2011 Document Revised: 04/15/2014 Document Reviewed: 01/27/2013 Elsevier Interactive Patient Education 2016 Elsevier Inc.    

## 2015-10-24 NOTE — ED Provider Notes (Signed)
CSN: [id number]     Arrival date & time 10/24/15  1927 History   First MD Initiated Contact with Patient 10/24/15 2008     Chief Complaint  Patient presents with  . Hypertension   (Consider location/radiation/quality/duration/timing/severity/associated sxs/prior Treatment) HPI History obtained from patient:  Pt presents with the cc of:  Elevated blood pressure Duration of symptoms: Today Treatment prior to arrival: None Context: Patient states that she was feeling a little woozy headed and went to [facility] and had her blood pressure checked and it was quite elevated. She immediately felt she was going to have a stroke and came to urgent care for evaluation. Patient has had issues with her blood pressure for quite some time now. She states that her doctor has removed all of her medications because her blood pressure was a bit low at one time. Other symptoms include: Lightheaded Pain score: No pain FAMILY HISTORY: Hypertension    Past Medical History  Diagnosis Date  . History of gonorrhea   . Gestational diabetes     2012  . Hypertension     2004  . Asthma     2010-only bothers pt during bad cold  . Depression     2010-had when mother passed away  . Pregnancy induced hypertension   . Obesity    Past Surgical History  Procedure Laterality Date  . Dilation and curettage of uterus    . Cholecystectomy    . Wisdom tooth extraction     Family History  Problem Relation Age of Onset  . Hypertension Mother   . Diabetes Mother   . Cancer Mother   . Hypertension Father   . Diabetes Father   . Hyperlipidemia Father   . Arthritis    . Asthma    . Breast cancer    . Heart failure    . Congenital heart disease    . Depression    . Heart attack     Social History  Substance Use Topics  . Smoking status: Former Smoker    Types: Cigarettes    Quit date: 10/31/2010  . Smokeless tobacco: Never Used  . Alcohol Use: No   OB History    Gravida Para Term Preterm AB TAB SAB Ectopic  Multiple Living   0 4 0 4 0 0 6     Review of Systems  Denies: HEADACHE, NAUSEA, ABDOMINAL PAIN, CHEST PAIN, CONGESTION, DYSURIA, SHORTNESS OF BREATH  Allergies  Bee venom; Lisinopril; and Shellfish allergy  Home Medications   Prior to Admission medications   Medication Sig Start Date End Date Taking? Authorizing Provider  albuterol (PROVENTIL HFA;VENTOLIN HFA) 108 (90 Base) MCG/ACT inhaler Inhale 2 puffs into the lungs every 6 (six) hours as needed for wheezing or shortness of breath. 07/26/15   Linna Hoff, MD  clindamycin (CLEOCIN) 150 MG capsule Take 1 capsule (150 mg total) by mouth 4 (four) times daily. 10/19/15   Linna Hoff, MD  doxycycline (VIBRAMYCIN) 100 MG capsule Take 1 capsule (100 mg total) by mouth 2 (two) times daily. Patient not taking: Reported on 10/23/2015 09/14/15   Tharon Aquas, PA  ergocalciferol (VITAMIN D2) 50000 units capsule Take 1 capsule (50,000 Units total) by mouth once a week. 08/18/15   Jaclyn Shaggy, MD  HYDROcodone-homatropine (HYCODAN) 5-1.5 MG/5ML syrup Take 5 mLs by mouth every 8 (eight) hours as needed for cough. Patient not taking: Reported on 10/23/2015 09/08/15   Jaclyn Shaggy, MD  ibuprofen (ADVIL,MOTRIN) 600 MG  tablet Take 600 mg by mouth every 6 (six) hours as needed for mild pain.    Historical Provider, MD  ibuprofen (ADVIL,MOTRIN) 800 MG tablet Take 1 tablet (800 mg total) by mouth every 8 (eight) hours as needed. Patient not taking: Reported on 09/08/2015 09/01/15   Anders SimmondsAngela M McClung, PA-C  medroxyPROGESTERone (DEPO-PROVERA) 150 MG/ML injection Inject 1 mL (150 mg total) into the muscle every 3 (three) months. 02/27/15   Rachelle A Denney, CNM  omeprazole (PRILOSEC) 20 MG capsule Take 1 capsule (20 mg total) by mouth daily. 10/04/15   Hayden Rasmussenavid Mabe, NP  oxyCODONE-acetaminophen (PERCOCET/ROXICET) 5-325 MG tablet Take 1-2 tablets by mouth every 6 (six) hours as needed for severe pain. 10/19/15   Linna HoffJames D Kindl, MD  penicillin v potassium (VEETID)  500 MG tablet Take 1 tablet (500 mg total) by mouth 3 (three) times daily. Patient not taking: Reported on 10/23/2015 09/01/15   Anders SimmondsAngela M McClung, PA-C  predniSONE (DELTASONE) 20 MG tablet Take 1 tablet (20 mg total) by mouth 2 (two) times daily with a meal. Patient not taking: Reported on 10/23/2015 09/08/15   Jaclyn ShaggyEnobong Amao, MD  ranitidine (ZANTAC) 150 MG capsule Take 1 capsule (150 mg total) by mouth 2 (two) times daily. Patient not taking: Reported on 10/23/2015 10/04/15   Hayden Rasmussenavid Mabe, NP   Meds Ordered and Administered this Visit  Medications - No data to display  BP 146/97 mmHg  Pulse 64  Temp(Src) 98.9 F (37.2 C) (Oral)  Resp 16  SpO2 100%  LMP 10/20/2015 (Exact Date) No data found.   Physical Exam NURSES NOTES AND VITAL SIGNS REVIEWED. CONSTITUTIONAL: Well developed, well nourished, no acute distress HEENT: normocephalic, atraumatic EYES: Conjunctiva normal NECK:normal ROM, supple, no adenopathy PULMONARY:No respiratory distress, normal effort ABDOMINAL: Soft, ND, NT BS+, No CVAT MUSCULOSKELETAL: Normal ROM of all extremities,  SKIN: warm and dry without rash PSYCHIATRIC: Mood and affect, behavior are normal  ED Course  Procedures (including critical care time)  Labs Review Labs Reviewed - No data to display  Imaging Review No results found.   Visual Acuity Review  Right Eye Distance:   Left Eye Distance:   Bilateral Distance:    Right Eye Near:   Left Eye Near:    Bilateral Near:       Patient is advised to follow-up with her primary care provider before restarting any medication since she has been off all meds for several months now. Blood pressure at this time is normal. There is no emergency that patient needs to be transferred for. There is no indication of blood pressure needs to be immediately addressed.  MDM   1. Temporary high blood pressure     Patient is reassured that there are no issues that require transfer to higher level of care at this time  or additional tests. Patient is advised to continue home symptomatic treatment. Patient is advised that if there are new or worsening symptoms to attend the emergency department, contact primary care provider, or return to UC. Instructions of care provided discharged home in stable condition.    THIS NOTE WAS GENERATED USING A VOICE RECOGNITION SOFTWARE PROGRAM. ALL REASONABLE EFFORTS  WERE MADE TO PROOFREAD THIS DOCUMENT FOR ACCURACY.  I have verbally reviewed the discharge instructions with the patient. A printed AVS was given to the patient.  All questions were answered prior to discharge.      Tharon AquasFrank C Patrick, PA 10/24/15 2110

## 2015-10-24 NOTE — ED Notes (Signed)
PT reports hypertension since she had teeth pulled last week. PT used to be on multiple BP meds, but got her BP under control with diet and has been off of BP meds for 4 months. PT reports she has felt lightheaded and sluggish.

## 2015-10-25 ENCOUNTER — Encounter (HOSPITAL_COMMUNITY): Payer: Self-pay | Admitting: Emergency Medicine

## 2015-10-25 DIAGNOSIS — I10 Essential (primary) hypertension: Secondary | ICD-10-CM | POA: Insufficient documentation

## 2015-10-25 DIAGNOSIS — Z79899 Other long term (current) drug therapy: Secondary | ICD-10-CM | POA: Insufficient documentation

## 2015-10-25 DIAGNOSIS — J45909 Unspecified asthma, uncomplicated: Secondary | ICD-10-CM | POA: Insufficient documentation

## 2015-10-25 DIAGNOSIS — Z87891 Personal history of nicotine dependence: Secondary | ICD-10-CM | POA: Diagnosis not present

## 2015-10-25 LAB — CBC WITH DIFFERENTIAL/PLATELET
Basophils Absolute: 0 10*3/uL (ref 0.0–0.1)
Basophils Relative: 0 %
Eosinophils Absolute: 0.2 10*3/uL (ref 0.0–0.7)
Eosinophils Relative: 2 %
HCT: 37.1 % (ref 36.0–46.0)
Hemoglobin: 11.8 g/dL — ABNORMAL LOW (ref 12.0–15.0)
Lymphocytes Relative: 38 %
Lymphs Abs: 2.7 10*3/uL (ref 0.7–4.0)
MCH: 25.9 pg — ABNORMAL LOW (ref 26.0–34.0)
MCHC: 31.8 g/dL (ref 30.0–36.0)
MCV: 81.4 fL (ref 78.0–100.0)
Monocytes Absolute: 0.2 10*3/uL (ref 0.1–1.0)
Monocytes Relative: 3 %
Neutro Abs: 4.1 10*3/uL (ref 1.7–7.7)
Neutrophils Relative %: 57 %
Platelets: 304 10*3/uL (ref 150–400)
RBC: 4.56 MIL/uL (ref 3.87–5.11)
RDW: 13.5 % (ref 11.5–15.5)
WBC: 7.3 10*3/uL (ref 4.0–10.5)

## 2015-10-25 LAB — URINALYSIS, ROUTINE W REFLEX MICROSCOPIC
Bilirubin Urine: NEGATIVE
Glucose, UA: NEGATIVE mg/dL
Ketones, ur: NEGATIVE mg/dL
Leukocytes, UA: NEGATIVE
Nitrite: NEGATIVE
Protein, ur: NEGATIVE mg/dL
Specific Gravity, Urine: 1.009 (ref 1.005–1.030)
pH: 6 (ref 5.0–8.0)

## 2015-10-25 LAB — BASIC METABOLIC PANEL
Anion gap: 3 — ABNORMAL LOW (ref 5–15)
BUN: 8 mg/dL (ref 6–20)
CO2: 25 mmol/L (ref 22–32)
Calcium: 9.7 mg/dL (ref 8.9–10.3)
Chloride: 107 mmol/L (ref 101–111)
Creatinine, Ser: 0.64 mg/dL (ref 0.44–1.00)
GFR calc Af Amer: >60 mL/min (ref >60)
GFR calc non Af Amer: >60 mL/min (ref >60)
Glucose, Bld: 81 mg/dL (ref 65–99)
Potassium: 3.9 mmol/L (ref 3.5–5.1)
Sodium: 135 mmol/L (ref 135–145)

## 2015-10-25 LAB — POC URINE PREG, ED: Preg Test, Ur: NEGATIVE

## 2015-10-25 LAB — URINE MICROSCOPIC-ADD ON: WBC, UA: NONE SEEN WBC/hpf (ref 0–5)

## 2015-10-25 NOTE — ED Notes (Signed)
Pt. reports elevated blood pressure this evening 183/112 with headache , denies nausea or fever . [facility] the same complaint .

## 2015-10-26 ENCOUNTER — Emergency Department (HOSPITAL_COMMUNITY)
Admission: EM | Admit: 2015-10-26 | Discharge: 2015-10-26 | Disposition: A | Discharge status: Home / Self Care | Payer: Medicaid Other | Attending: Emergency Medicine | Admitting: Emergency Medicine

## 2015-10-26 DIAGNOSIS — I10 Essential (primary) hypertension: Secondary | ICD-10-CM

## 2015-10-26 NOTE — Discharge Instructions (Signed)
Hypertension Hypertension, commonly called high blood pressure, is when the force of blood pumping through your arteries is too strong. Your arteries are the blood vessels that carry blood from your heart throughout your body. A blood pressure reading consists of a higher number over a lower number, such as 110/72. The higher number (systolic) is the pressure inside your arteries when your heart pumps. The lower number (diastolic) is the pressure inside your arteries when your heart relaxes. Ideally you want your blood pressure below 120/80. Hypertension forces your heart to work harder to pump blood. Your arteries may become narrow or stiff. Having untreated or uncontrolled hypertension can cause heart attack, stroke, kidney disease, and other problems. RISK FACTORS Some risk factors for high blood pressure are controllable. Others are not.  Risk factors you cannot control include:   Race. You may be at higher risk if you are African American.  Age. Risk increases with age.  Gender. Men are at higher risk than women before age 45 years. After age 65, women are at higher risk than men. Risk factors you can control include:  Not getting enough exercise or physical activity.  Being overweight.  Getting too much fat, sugar, calories, or salt in your diet.  Drinking too much alcohol. SIGNS AND SYMPTOMS Hypertension does not usually cause signs or symptoms. Extremely high blood pressure (hypertensive crisis) may cause headache, anxiety, shortness of breath, and nosebleed. DIAGNOSIS To check if you have hypertension, your health care provider will measure your blood pressure while you are seated, with your arm held at the level of your heart. It should be measured at least twice using the same arm. Certain conditions can cause a difference in blood pressure between your right and left arms. A blood pressure reading that is higher than normal on one occasion does not mean that you need treatment. If  it is not clear whether you have high blood pressure, you may be asked to return on a different day to have your blood pressure checked again. Or, you may be asked to monitor your blood pressure at home for 1 or more weeks. TREATMENT Treating high blood pressure includes making lifestyle changes and possibly taking medicine. Living a healthy lifestyle can help lower high blood pressure. You may need to change some of your habits. Lifestyle changes may include:  Following the DASH diet. This diet is high in fruits, vegetables, and whole grains. It is low in salt, red meat, and added sugars.  Keep your sodium intake below 2,300 mg per day.  Getting at least 30-45 minutes of aerobic exercise at least 4 times per week.  Losing weight if necessary.  Not smoking.  Limiting alcoholic beverages.  Learning ways to reduce stress. Your health care provider may prescribe medicine if lifestyle changes are not enough to get your blood pressure under control, and if one of the following is true:  You are 18-59 years of age and your systolic blood pressure is above 140.  You are 60 years of age or older, and your systolic blood pressure is above 150.  Your diastolic blood pressure is above 90.  You have diabetes, and your systolic blood pressure is over 140 or your diastolic blood pressure is over 90.  You have kidney disease and your blood pressure is above 140/90.  You have heart disease and your blood pressure is above 140/90. Your personal target blood pressure may vary depending on your medical conditions, your age, and other factors. HOME CARE INSTRUCTIONS    Have your blood pressure rechecked as directed by your health care provider.   Take medicines only as directed by your health care provider. Follow the directions carefully. Blood pressure medicines must be taken as prescribed. The medicine does not work as well when you skip doses. Skipping doses also puts you at risk for  problems.  Do not smoke.   Monitor your blood pressure at home as directed by your health care provider. SEEK MEDICAL CARE IF:   You think you are having a reaction to medicines taken.  You have recurrent headaches or feel dizzy.  You have swelling in your ankles.  You have trouble with your vision. SEEK IMMEDIATE MEDICAL CARE IF:  You develop a severe headache or confusion.  You have unusual weakness, numbness, or feel faint.  You have severe chest or abdominal pain.  You vomit repeatedly.  You have trouble breathing. MAKE SURE YOU:   Understand these instructions.  Will watch your condition.  Will get help right away if you are not doing well or get worse.   This information is not intended to replace advice given to you by your health care provider. Make sure you discuss any questions you have with your health care provider.   Document Released: 03/25/2005 Document Revised: 08/09/2014 Document Reviewed: 01/15/2013 Elsevier Interactive Patient Education 2016 [employer] DASH Eating Plan DASH stands for "Dietary Approaches to Stop Hypertension." The DASH eating plan is a healthy eating plan that has been shown to reduce high blood pressure (hypertension). Additional health benefits may include reducing the risk of type 2 diabetes mellitus, heart disease, and stroke. The DASH eating plan may also help with weight loss. WHAT DO I NEED TO KNOW ABOUT THE DASH EATING PLAN? For the DASH eating plan, you will follow these general guidelines:  Choose foods with a percent daily value for sodium of less than 5% (as listed on the food label).  Use salt-free seasonings or herbs instead of table salt or sea salt.  Check with your health care provider or pharmacist before using salt substitutes.  Eat lower-sodium products, often labeled as "lower sodium" or "no salt added."  Eat fresh foods.  Eat more vegetables, fruits, and low-fat dairy products.  Choose whole grains.  Look for the word "whole" as the first word in the ingredient list.  Choose fish and skinless chicken or turkey more often than red meat. Limit fish, poultry, and meat to 6 oz (170 g) each day.  Limit sweets, desserts, sugars, and sugary drinks.  Choose heart-healthy fats.  Limit cheese to 1 oz (28 g) per day.  Eat more home-cooked food and less restaurant, buffet, and fast food.  Limit fried foods.  Cook foods using methods other than frying.  Limit canned vegetables. If you do use them, rinse them well to decrease the sodium.  When eating at a restaurant, ask that your food be prepared with less salt, or no salt if possible. WHAT FOODS CAN I EAT? Seek help from a dietitian for individual calorie needs. Grains Whole grain or whole wheat bread. Brown rice. Whole grain or whole wheat pasta. Quinoa, bulgur, and whole grain cereals. Low-sodium cereals. Corn or whole wheat flour tortillas. Whole grain cornbread. Whole grain crackers. Low-sodium crackers. Vegetables Fresh or frozen vegetables (raw, steamed, roasted, or grilled). Low-sodium or reduced-sodium tomato and vegetable juices. Low-sodium or reduced-sodium tomato sauce and paste. Low-sodium or reduced-sodium canned vegetables.  Fruits All fresh, canned (in natural juice), or frozen fruits. Meat and Other   Other Protein Products Ground beef (85% or leaner), grass-fed beef, or beef trimmed of fat. Skinless chicken or [place]. Ground chicken or [place]. Pork trimmed of fat. All fish and seafood. Eggs. Dried beans, peas, or lentils. Unsalted nuts and seeds. Unsalted canned beans. Dairy Low-fat dairy products, such as skim or 1% milk, 2% or reduced-fat cheeses, low-fat ricotta or cottage cheese, or plain low-fat yogurt. Low-sodium or reduced-sodium cheeses. Fats and Oils Tub margarines without trans fats. Light or reduced-fat mayonnaise and salad dressings (reduced sodium). Avocado. Safflower, olive, or canola oils. Natural peanut or  almond butter. Other Unsalted popcorn and pretzels. The items listed above may not be a complete list of recommended foods or beverages. Contact your dietitian for more options. WHAT FOODS ARE NOT RECOMMENDED? Grains White bread. White pasta. White rice. Refined cornbread. Bagels and croissants. Crackers that contain trans fat. Vegetables Creamed or fried vegetables. Vegetables in a cheese sauce. Regular canned vegetables. Regular canned tomato sauce and paste. Regular tomato and vegetable juices. Fruits Dried fruits. Canned fruit in light or heavy syrup. Fruit juice. Meat and Other Protein Products Fatty cuts of meat. Ribs, chicken wings, bacon, sausage, bologna, salami, chitterlings, fatback, hot dogs, bratwurst, and packaged luncheon meats. Salted nuts and seeds. Canned beans with salt. Dairy Whole or 2% milk, cream, half-and-half, and cream cheese. Whole-fat or sweetened yogurt. Full-fat cheeses or blue cheese. Nondairy creamers and whipped toppings. Processed cheese, cheese spreads, or cheese curds. Condiments Onion and garlic salt, seasoned salt, table salt, and sea salt. Canned and packaged gravies. Worcestershire sauce. Tartar sauce. Barbecue sauce. Teriyaki sauce. Soy sauce, including reduced sodium. Steak sauce. Fish sauce. Oyster sauce. Cocktail sauce. Horseradish. Ketchup and mustard. Meat flavorings and tenderizers. Bouillon cubes. Hot sauce. Tabasco sauce. Marinades. Taco seasonings. Relishes. Fats and Oils Butter, stick margarine, lard, shortening, ghee, and bacon fat. Coconut, palm kernel, or palm oils. Regular salad dressings. Other Pickles and olives. Salted popcorn and pretzels. The items listed above may not be a complete list of foods and beverages to avoid. Contact your dietitian for more information. WHERE CAN I FIND MORE INFORMATION? National [employer], [employer], [facility] [employer] [facility]: [employer]   This information is not intended to  replace advice given to you by your health care provider. Make sure you discuss any questions you have with your health care provider.   Document Released: 03/14/2011 Document Revised: 04/15/2014 Document Reviewed: 01/27/2013 Elsevier Interactive Patient Education 2016 [employer].  How to Take Your Blood Pressure HOW DO I GET A BLOOD PRESSURE MACHINE?  You can buy an electronic home blood pressure machine at your local pharmacy. Insurance will sometimes cover the cost if you have a prescription.  Ask your doctor what type of machine is best for you. There are different machines for your arm and your wrist.  If you decide to buy a machine to check your blood pressure on your arm, first check the size of your arm so you can buy the right size cuff. To check the size of your arm:   Use a measuring tape that shows both inches and centimeters.   Wrap the measuring tape around the upper-middle part of your arm. You may need someone to help you measure.   Write down your arm measurement in both inches and centimeters.   To measure your blood pressure correctly, it is important to have the right size cuff.   If your arm is up to 13 inches (up to 34 centimeters), get an adult cuff size.  If  your arm is 13 to 17 inches (35 to 44 centimeters), get a large adult cuff size.    If your arm is 17 to 20 inches (45 to 52 centimeters), get an adult thigh cuff.  WHAT DO THE NUMBERS MEAN?   There are two numbers that make up your blood pressure. For example: 120/80.  The first number (120 in our example) is called the "systolic pressure." It is a measure of the pressure in your blood vessels when your heart is pumping blood.  The second number (80 in our example) is called the "diastolic pressure." It is a measure of the pressure in your blood vessels when your heart is resting between beats.  Your doctor will tell you what your blood pressure should be. WHAT SHOULD I DO BEFORE I CHECK MY  BLOOD PRESSURE?   Try to rest or relax for at least 30 minutes before you check your blood pressure.  Do not smoke.  Do not have any drinks with caffeine, such as:  Soda.  Coffee.  Tea.  Check your blood pressure in a quiet room.  Sit down and stretch out your arm on a table. Keep your arm at about the level of your heart. Let your arm relax.  Make sure that your legs are not crossed. HOW DO I CHECK MY BLOOD PRESSURE?  Follow the directions that came with your machine.  Make sure you remove any tight-fitting clothing from your arm or wrist. Wrap the cuff around your upper arm or wrist. You should be able to fit a finger between the cuff and your arm. If you cannot fit a finger between the cuff and your arm, it is too tight and should be removed and rewrapped.  Some units require you to manually pump up the arm cuff.  Automatic units inflate the cuff when you press a button.  Cuff deflation is automatic in both models.  After the cuff is inflated, the unit measures your blood pressure and pulse. The readings are shown on a monitor. Hold still and breathe normally while the cuff is inflated.  Getting a reading takes less than a minute.  Some models store readings in a memory. Some provide a printout of readings. If your machine does not store your readings, keep a written record.  Take readings with you to your next visit with your doctor.   This information is not intended to replace advice given to you by your health care provider. Make sure you discuss any questions you have with your health care provider.   Document Released: 03/07/2008 Document Revised: 04/15/2014 Document Reviewed: 05/20/2013 Elsevier Interactive Patient Education 2016 [employer]  Hypertension Hypertension, commonly called high blood pressure, is when the force of blood pumping through your arteries is too strong. Your arteries are the blood vessels that carry blood from your heart throughout your  body. A blood pressure reading consists of a higher number over a lower number, such as 110/72. The higher number (systolic) is the pressure inside your arteries when your heart pumps. The lower number (diastolic) is the pressure inside your arteries when your heart relaxes. Ideally you want your blood pressure below 120/80. Hypertension forces your heart to work harder to pump blood. Your arteries may become narrow or stiff. Having untreated or uncontrolled hypertension can cause heart attack, stroke, kidney disease, and other problems. RISK FACTORS Some risk factors for high blood pressure are controllable. Others are not.  Risk factors you cannot control include:   Race.  You may be at higher risk if you are African American.  Age. Risk increases with age.  Gender. Men are at higher risk than women before age 27 years. After age 34, women are at higher risk than men. Risk factors you can control include:  Not getting enough exercise or physical activity.  Being overweight.  Getting too much fat, sugar, calories, or salt in your diet.  Drinking too much alcohol. SIGNS AND SYMPTOMS Hypertension does not usually cause signs or symptoms. Extremely high blood pressure (hypertensive crisis) may cause headache, anxiety, shortness of breath, and nosebleed. DIAGNOSIS To check if you have hypertension, your health care provider will measure your blood pressure while you are seated, with your arm held at the level of your heart. It should be measured at least twice using the same arm. Certain conditions can cause a difference in blood pressure between your right and left arms. A blood pressure reading that is higher than normal on one occasion does not mean that you need treatment. If it is not clear whether you have high blood pressure, you may be asked to return on a different day to have your blood pressure checked again. Or, you may be asked to monitor your blood pressure at home for 1 or more  weeks. TREATMENT Treating high blood pressure includes making lifestyle changes and possibly taking medicine. Living a healthy lifestyle can help lower high blood pressure. You may need to change some of your habits. Lifestyle changes may include:  Following the DASH diet. This diet is high in fruits, vegetables, and whole grains. It is low in salt, red meat, and added sugars.  Keep your sodium intake below 2,300 mg per day.  Getting at least 30-45 minutes of aerobic exercise at least 4 times per week.  Losing weight if necessary.  Not smoking.  Limiting alcoholic beverages.  Learning ways to reduce stress. Your health care provider may prescribe medicine if lifestyle changes are not enough to get your blood pressure under control, and if one of the following is true:  You are 40-57 years of age and your systolic blood pressure is above 140.  You are 63 years of age or older, and your systolic blood pressure is above 150.  Your diastolic blood pressure is above 90.  You have diabetes, and your systolic blood pressure is over 140 or your diastolic blood pressure is over 90.  You have kidney disease and your blood pressure is above 140/90.  You have heart disease and your blood pressure is above 140/90. Your personal target blood pressure may vary depending on your medical conditions, your age, and other factors. HOME CARE INSTRUCTIONS  Have your blood pressure rechecked as directed by your health care provider.   Take medicines only as directed by your health care provider. Follow the directions carefully. Blood pressure medicines must be taken as prescribed. The medicine does not work as well when you skip doses. Skipping doses also puts you at risk for problems.  Do not smoke.   Monitor your blood pressure at home as directed by your health care provider. SEEK MEDICAL CARE IF:   You think you are having a reaction to medicines taken.  You have recurrent headaches or  feel dizzy.  You have swelling in your ankles.  You have trouble with your vision. SEEK IMMEDIATE MEDICAL CARE IF:  You develop a severe headache or confusion.  You have unusual weakness, numbness, or feel faint.  You have severe chest or  abdominal pain.  You vomit repeatedly.  You have trouble breathing. MAKE SURE YOU:   Understand these instructions.  Will watch your condition.  Will get help right away if you are not doing well or get worse.   This information is not intended to replace advice given to you by your health care provider. Make sure you discuss any questions you have with your health care provider.   Document Released: 03/25/2005 Document Revised: 08/09/2014 Document Reviewed: 01/15/2013 Elsevier Interactive Patient Education [employer]

## 2015-10-26 NOTE — ED Provider Notes (Signed)
CSN: [id number]     Arrival date & time 10/25/15  2153 History  By signing my name below, I, Soijett Blue, attest that this documentation has been prepared under the direction and in the presence of Raeford Razor, MD. Electronically Signed: Soijett Blue, ED Scribe. 10/26/2015. 12:58 AM.   Chief Complaint  Patient presents with  . Hypertension      The history is provided by the patient. No language interpreter was used.    Crystal Burns is a 35 y.o. female with a medical hx of HTN who presents to the Emergency Department complaining of HTN onset 4 days. Pt reports that she took her blood pressure tonight PTA at a local [facility] and it was 183/112 PTA. Pt notes that she was feeling lightheaded and dizzy prior to the reading that she obtained tonight. Pt notes that she was taken off her HTN medications 4 months ago due to losing 40 lbs. Pt reports that she has a personal blood pressure cuff that is broken, pt notes that she is going to buy another blood pressure cuff in 2 days to keep a record of her blood pressure readings. Pt states that she had a dental extraction completed 1.5 weeks ago and since then her HTN has spiked and fluctuated. Pt states that she has an appointment with her PCP tomorrow morning at 9:30 AM. Pt is having associated symptoms of HA, fatigue, and generalized weakness. She notes that she has tried an old Rx 25 mg losartan for the relief of her symptoms. She denies any other symptoms. Pt states that she is a single parent of 6 children.  Per pt chart review: Pt was seen in the Urgent Care on 10/24/2015 for HTN. Pt was informed to follow up with her PCP to discuss restarting HTN medications, since she had not been on her HTN medications for several months.    Past Medical History  Diagnosis Date  . History of gonorrhea   . Gestational diabetes     2012  . Hypertension     2004  . Asthma     2010-only bothers pt during bad cold  . Depression     2010-had when mother  passed away  . Pregnancy induced hypertension   . Obesity    Past Surgical History  Procedure Laterality Date  . Dilation and curettage of uterus    . Cholecystectomy    . Wisdom tooth extraction     Family History  Problem Relation Age of Onset  . Hypertension Mother   . Diabetes Mother   . Cancer Mother   . Hypertension Father   . Diabetes Father   . Hyperlipidemia Father   . Arthritis    . Asthma    . Breast cancer    . Heart failure    . Congenital heart disease    . Depression    . Heart attack     Social History  Substance Use Topics  . Smoking status: Former Smoker    Types: Cigarettes    Quit date: 10/31/2010  . Smokeless tobacco: Never Used  . Alcohol Use: No   OB History    Gravida Para Term Preterm AB TAB SAB Ectopic Multiple Living   0 4 0 4 0 0 6     Review of Systems    Allergies  Bee venom; Lisinopril; and Shellfish allergy  Home Medications   Prior to Admission medications   Medication Sig Start Date End Date Taking?  Authorizing Provider  albuterol (PROVENTIL HFA;VENTOLIN HFA) 108 (90 Base) MCG/ACT inhaler Inhale 2 puffs into the lungs every 6 (six) hours as needed for wheezing or shortness of breath. 07/26/15   Linna HoffJames D Kindl, MD  clindamycin (CLEOCIN) 150 MG capsule Take 1 capsule (150 mg total) by mouth 4 (four) times daily. 10/19/15   Linna HoffJames D Kindl, MD  doxycycline (VIBRAMYCIN) 100 MG capsule Take 1 capsule (100 mg total) by mouth 2 (two) times daily. Patient not taking: Reported on 10/23/2015 09/14/15   Tharon AquasFrank C Patrick, PA  ergocalciferol (VITAMIN D2) 50000 units capsule Take 1 capsule (50,000 Units total) by mouth once a week. 08/18/15   Jaclyn ShaggyEnobong Amao, MD  HYDROcodone-homatropine (HYCODAN) 5-1.5 MG/5ML syrup Take 5 mLs by mouth every 8 (eight) hours as needed for cough. Patient not taking: Reported on 10/23/2015 09/08/15   Jaclyn ShaggyEnobong Amao, MD  ibuprofen (ADVIL,MOTRIN) 600 MG tablet Take 600 mg by mouth every 6 (six) hours as needed for mild  pain.    Historical Provider, MD  ibuprofen (ADVIL,MOTRIN) 800 MG tablet Take 1 tablet (800 mg total) by mouth every 8 (eight) hours as needed. Patient not taking: Reported on 09/08/2015 09/01/15   Anders SimmondsAngela M McClung, PA-C  medroxyPROGESTERone (DEPO-PROVERA) 150 MG/ML injection Inject 1 mL (150 mg total) into the muscle every 3 (three) months. 02/27/15   Rachelle A Denney, CNM  omeprazole (PRILOSEC) 20 MG capsule Take 1 capsule (20 mg total) by mouth daily. 10/04/15   Hayden Rasmussenavid Mabe, NP  oxyCODONE-acetaminophen (PERCOCET/ROXICET) 5-325 MG tablet Take 1-2 tablets by mouth every 6 (six) hours as needed for severe pain. 10/19/15   Linna HoffJames D Kindl, MD  penicillin v potassium (VEETID) 500 MG tablet Take 1 tablet (500 mg total) by mouth 3 (three) times daily. Patient not taking: Reported on 10/23/2015 09/01/15   Anders SimmondsAngela M McClung, PA-C  predniSONE (DELTASONE) 20 MG tablet Take 1 tablet (20 mg total) by mouth 2 (two) times daily with a meal. Patient not taking: Reported on 10/23/2015 09/08/15   Jaclyn ShaggyEnobong Amao, MD  ranitidine (ZANTAC) 150 MG capsule Take 1 capsule (150 mg total) by mouth 2 (two) times daily. Patient not taking: Reported on 10/23/2015 10/04/15   Hayden Rasmussenavid Mabe, NP   BP 125/85 mmHg  Pulse 60  Temp(Src) 99.2 F (37.3 C) (Oral)  Resp 13  Ht 5\' 3"  (1.6 m)  Wt 250 lb 2 oz (113.456 kg)  BMI 44.32 kg/m2  SpO2 100%  LMP 10/20/2015 (Exact Date) Physical Exam  Constitutional: She is oriented to person, place, and time. She appears well-developed and well-nourished. No distress.  HENT:  Head: Normocephalic and atraumatic.  Eyes: EOM are normal.  Neck: Neck supple.  Cardiovascular: Normal rate, regular rhythm and normal heart sounds.  Exam reveals no gallop and no friction rub.   No murmur heard. Pulmonary/Chest: Effort normal and breath sounds normal. No respiratory distress. She has no wheezes. She has no rales.  Abdominal: She exhibits no distension.  Musculoskeletal: Normal range of motion.  Neurological: She  is alert and oriented to person, place, and time.  Skin: Skin is warm and dry.  Psychiatric: She has a normal mood and affect. Her behavior is normal.  Nursing note and vitals reviewed.   ED Course  Procedures (including critical care time) DIAGNOSTIC STUDIES: Oxygen Saturation is 100% on RA, nl by my interpretation.    COORDINATION OF CARE: 12:56 AM Discussed treatment plan with pt at bedside which includes labs, UA, and pt agreed to plan.    Labs  Review Labs Reviewed  CBC WITH DIFFERENTIAL/PLATELET - Abnormal; Notable for the following:    Hemoglobin 11.8 (*)    MCH 25.9 (*)    All other components within normal limits  BASIC METABOLIC PANEL - Abnormal; Notable for the following:    Anion gap 3 (*)    All other components within normal limits  URINALYSIS, ROUTINE W REFLEX MICROSCOPIC (NOT AT [facility]) - Abnormal; Notable for the following:    Hgb urine dipstick LARGE (*)    All other components within normal limits  URINE MICROSCOPIC-ADD ON - Abnormal; Notable for the following:    Squamous Epithelial / LPF 0-5 (*)    Bacteria, UA RARE (*)    All other components within normal limits  POC URINE PREG, ED    I have personally reviewed and evaluated these lab results as part of my medical decision-making.   MDM   Final diagnoses:  Essential hypertension   I personally preformed the services scribed in my presence. The recorded information has been reviewed is accurate. Raeford Razor, MD.    Raeford Razor, MD 11/09/15 2224

## 2015-10-30 ENCOUNTER — Ambulatory Visit (HOSPITAL_COMMUNITY)
Admission: EM | Admit: 2015-10-30 | Discharge: 2015-10-30 | Disposition: A | Discharge status: Home / Self Care | Payer: Medicaid Other | Attending: Emergency Medicine | Admitting: Emergency Medicine

## 2015-10-30 ENCOUNTER — Encounter (HOSPITAL_COMMUNITY): Payer: Self-pay | Admitting: Emergency Medicine

## 2015-10-30 DIAGNOSIS — K0889 Other specified disorders of teeth and supporting structures: Secondary | ICD-10-CM | POA: Diagnosis not present

## 2015-10-30 DIAGNOSIS — R030 Elevated blood-pressure reading, without diagnosis of hypertension: Secondary | ICD-10-CM

## 2015-10-30 DIAGNOSIS — IMO0001 Reserved for inherently not codable concepts without codable children: Secondary | ICD-10-CM

## 2015-10-30 MED ORDER — OXYCODONE-ACETAMINOPHEN 5-325 MG PO TABS: 1.0000 | ORAL_TABLET | Freq: Four times a day (QID) | ORAL | 0 refills | Status: DC | PRN

## 2015-10-30 MED ORDER — AMOXICILLIN 875 MG PO TABS: 875.0000 mg | ORAL_TABLET | Freq: Two times a day (BID) | ORAL | 0 refills | Status: DC

## 2015-10-30 NOTE — ED Triage Notes (Signed)
The patient presented to the [facility] with a complaint of dental pain x 3 weeks secondary to having three wisdom teeth removed and HTN over the last week.

## 2015-10-30 NOTE — ED Provider Notes (Signed)
[facility]    CSN: [id number] Arrival date & time: 10/30/15  1836  First Provider Contact:  First MD Initiated Contact with Patient 10/30/15 1948        History   Chief Complaint Chief Complaint  Patient presents with  . Dental Pain  . Hypertension    HPI Crystal Burns is a 35 y.o. female.   She is a 35 year old woman here for evaluation of dental pain and high blood pressure. She states she had 3 teeth pulled about a week ago. 2 of them have done well, but the left lower wisdom tooth that was pulled has continued to cause her pain. It is painful to chew on that side. She also reports a headache in the left temple.  No drainage. No fevers. She is also had intermittently elevated blood pressures over the last several weeks as she has been dealing with the dental problems. She tried calling her dentist today, but the dentist is out of the office this week.    Past Medical History:  Diagnosis Date  . Asthma    2010-only bothers pt during bad cold  . Depression    2010-had when mother passed away  . Gestational diabetes    2012  . History of gonorrhea   . Hypertension    2004  . Obesity   . Pregnancy induced hypertension     Patient Active Problem List   Diagnosis Date Noted  . Tooth ache 08/25/2015  . Vitamin D deficiency 07/10/2015  . Anxiety state 06/20/2015  . Light headedness 05/24/2015  . Costochondritis 05/18/2015  . Encounter for trial of labor 01/27/2015  . NSVD (normal spontaneous vaginal delivery) 01/27/2015  . Preeclampsia complicating hypertension 01/21/2015  . Evaluate anatomy not seen on prior sonogram   . [redacted] weeks gestation of pregnancy   . [redacted] weeks gestation of pregnancy   . Encounter for fetal anatomic survey   . Maternal morbid obesity, antepartum (HCC)   . Placenta previa antepartum in second trimester   . Osteoarthritis of right knee 04/22/2014  . Essential hypertension 07/18/2012  . LUMBOSACRAL STRAIN, ACUTE 07/05/2008  .  Morbid obesity (HCC) 02/24/2008  . ASTHMA UNSPECIFIED WITH EXACERBATION 02/24/2008  . HYPERGLYCEMIA, BORDERLINE 02/24/2008    Past Surgical History:  Procedure Laterality Date  . CHOLECYSTECTOMY    . DILATION AND CURETTAGE OF UTERUS    . WISDOM TOOTH EXTRACTION      OB History    Gravida Para Term Preterm AB Living   0 4 6   SAB TAB Ectopic Multiple Live Births   4 0 0 0         Home Medications    Prior to Admission medications   Medication Sig Start Date End Date Taking? Authorizing Provider  albuterol (PROVENTIL HFA;VENTOLIN HFA) 108 (90 Base) MCG/ACT inhaler Inhale 2 puffs into the lungs every 6 (six) hours as needed for wheezing or shortness of breath. 07/26/15   Linna Hoff, MD  amoxicillin (AMOXIL) 875 MG tablet Take 1 tablet (875 mg total) by mouth 2 (two) times daily. 10/30/15   Charm Rings, MD  ergocalciferol (VITAMIN D2) 50000 units capsule Take 1 capsule (50,000 Units total) by mouth once a week. 08/18/15   Jaclyn Shaggy, MD  ibuprofen (ADVIL,MOTRIN) 600 MG tablet Take 600 mg by mouth every 6 (six) hours as needed for mild pain.    Historical Provider, MD  [id number] (DEPO-PROVERA) 150 MG/ML injection Inject 1 mL (150 mg  total) into the muscle every 3 (three) months. 02/27/15   Rachelle A Denney, CNM  omeprazole (PRILOSEC) 20 MG capsule Take 1 capsule (20 mg total) by mouth daily. 10/04/15   Hayden Rasmussen, NP  oxyCODONE-acetaminophen (PERCOCET/ROXICET) 5-325 MG tablet Take 1-2 tablets by mouth every 6 (six) hours as needed for severe pain. 10/19/15   Linna Hoff, MD  oxyCODONE-acetaminophen (PERCOCET/ROXICET) 5-325 MG tablet Take 1 tablet by mouth every 6 (six) hours as needed for severe pain. 10/30/15   Charm Rings, MD    Family History Family History  Problem Relation Age of Onset  . Hypertension Mother   . Diabetes Mother   . Cancer Mother   . Hypertension Father   . Diabetes Father   . Hyperlipidemia Father   . Arthritis    . Asthma    .  Breast cancer    . Heart failure    . Congenital heart disease    . Depression    . Heart attack      Social History Social History  Substance Use Topics  . Smoking status: Former Smoker    Types: Cigarettes    Quit date: 10/31/2010  . Smokeless tobacco: Never Used  . Alcohol use No     Allergies   Bee venom; Lisinopril; and Shellfish allergy   Review of Systems Review of Systems  Constitutional: Negative for fever.  HENT: Positive for dental problem.   Eyes: Negative for visual disturbance.  Respiratory: Negative for shortness of breath.   Cardiovascular: Negative for chest pain.  Neurological: Positive for headaches.     Physical Exam Triage Vital Signs ED Triage Vitals  Enc Vitals Group     BP 10/30/15 1941 (!) 164/106     Pulse Rate 10/30/15 1941 72     Resp 10/30/15 1941 20     Temp 10/30/15 1941 99.1 F (37.3 C)     Temp Source 10/30/15 1941 Oral     SpO2 10/30/15 1941 100 %     Weight --      Height --      Head Circumference --      Peak Flow --      Pain Score 10/30/15 1952 7     Pain Loc --      Pain Edu? --      Excl. in GC? --    No data found.   Updated Vital Signs BP 145/92 (BP Location: Left Arm)   Pulse 72   Temp 99.1 F (37.3 C) (Oral)   Resp 20   LMP 10/20/2015 (Exact Date)   SpO2 100%   Visual Acuity Right Eye Distance:   Left Eye Distance:   Bilateral Distance:    Right Eye Near:   Left Eye Near:    Bilateral Near:     Physical Exam  Constitutional: She is oriented to person, place, and time. She appears well-developed and well-nourished. No distress.  HENT:  Mild erythema of the gums at the site of the left lower wisdom tooth.  Cardiovascular: Normal rate, regular rhythm and normal heart sounds.   Pulmonary/Chest: Effort normal.  Neurological: She is alert and oriented to person, place, and time.     UC Treatments / Results  Labs (all labs ordered are listed, but only abnormal results are displayed) Labs  Reviewed - No data to display  EKG  EKG Interpretation None       Radiology No results found.  Procedures Procedures (including critical care time)  Medications Ordered in UC Medications - No data to display   Initial Impression / Assessment and Plan / UC Course  I have reviewed the triage vital signs and the nursing notes.  Pertinent labs & imaging results that were available during my care of the patient were reviewed by me and considered in my medical decision making (see chart for details).  Clinical Course    Repeat blood pressure is improved at 145/92. I suspect her blood pressure is more related to pain and anxiety at this time. Will treat for infection with amoxicillin. Prescription given for Percocet to use as needed for pain. She has some losartan from when she used to be on medication for her blood pressure. Provided guidelines on when to start taking this. She will continue to monitor her blood pressure at home.  Final Clinical Impressions(s) / UC Diagnoses   Final diagnoses:  Pain, dental  Elevated blood pressure    New Prescriptions Discharge Medication List as of 10/30/2015  8:21 PM    START taking these medications   Details  amoxicillin (AMOXIL) 875 MG tablet Take 1 tablet (875 mg total) by mouth 2 (two) times daily., Starting Mon 10/30/2015, Normal    !! oxyCODONE-acetaminophen (PERCOCET/ROXICET) 5-325 MG tablet Take 1 tablet by mouth every 6 (six) hours as needed for severe pain., Starting Mon 10/30/2015, Print     !! - Potential duplicate medications found. Please discuss with provider.       Charm Rings, MD 10/30/15 2038

## 2015-10-30 NOTE — Discharge Instructions (Signed)
I suspect you are a developing an infection in that right wisdom tooth area. Amoxicillin twice a day for 10 days. Use the Percocet every 4-6 hours as needed for severe pain.  I suspect your blood pressure is coming from the dental pain. Given how high it is today, please restart your losartan for the next week. Continue to monitor your blood pressure. If it goes below 120/90, stop the losartan.  Follow-up with your dentist next week. Follow-up with your primary care doctor if her blood pressure continues to be high.

## 2015-11-09 ENCOUNTER — Other Ambulatory Visit: Payer: Self-pay | Admitting: Family Medicine

## 2015-11-09 DIAGNOSIS — E559 Vitamin D deficiency, unspecified: Secondary | ICD-10-CM

## 2015-11-21 ENCOUNTER — Telehealth: Payer: Self-pay | Admitting: Family Medicine

## 2015-11-21 ENCOUNTER — Other Ambulatory Visit: Payer: Self-pay | Admitting: Family Medicine

## 2015-11-21 DIAGNOSIS — E559 Vitamin D deficiency, unspecified: Secondary | ICD-10-CM

## 2015-11-21 NOTE — Telephone Encounter (Signed)
Pt called the facility to speak with nurse regarding her medication and bp. Patient stated that she needs a refill for ergocalciferol (VITAMIN D2) 50000 units capsule. She also stated that her bp has been going up. Please follow up.  Thank you.

## 2015-11-28 ENCOUNTER — Ambulatory Visit: Payer: Medicaid Other | Admitting: Family Medicine

## 2015-11-28 ENCOUNTER — Encounter: Payer: Self-pay | Admitting: Family Medicine

## 2015-11-28 ENCOUNTER — Telehealth: Payer: Self-pay

## 2015-11-28 ENCOUNTER — Ambulatory Visit: Payer: Medicaid Other | Attending: Family Medicine | Admitting: Family Medicine

## 2015-11-28 VITALS — BP 137/87 | HR 76 | Temp 98.8°F

## 2015-11-28 DIAGNOSIS — J45909 Unspecified asthma, uncomplicated: Secondary | ICD-10-CM | POA: Diagnosis not present

## 2015-11-28 DIAGNOSIS — Z79899 Other long term (current) drug therapy: Secondary | ICD-10-CM | POA: Diagnosis not present

## 2015-11-28 DIAGNOSIS — F410 Panic disorder [episodic paroxysmal anxiety] without agoraphobia: Secondary | ICD-10-CM | POA: Diagnosis not present

## 2015-11-28 DIAGNOSIS — R0981 Nasal congestion: Secondary | ICD-10-CM

## 2015-11-28 DIAGNOSIS — I10 Essential (primary) hypertension: Secondary | ICD-10-CM

## 2015-11-28 DIAGNOSIS — Z9889 Other specified postprocedural states: Secondary | ICD-10-CM | POA: Diagnosis not present

## 2015-11-28 DIAGNOSIS — H109 Unspecified conjunctivitis: Secondary | ICD-10-CM | POA: Insufficient documentation

## 2015-11-28 MED ORDER — LORATADINE 10 MG PO TABS: 10.0000 mg | ORAL_TABLET | Freq: Every day | ORAL | 1 refills | Status: DC

## 2015-11-28 MED ORDER — OFLOXACIN 0.3 % OP SOLN: 1.0000 [drp] | Freq: Four times a day (QID) | OPHTHALMIC | 0 refills | Status: DC

## 2015-11-28 MED ORDER — FLUTICASONE PROPIONATE 50 MCG/ACT NA SUSP: 1.0000 | Freq: Every day | NASAL | 1 refills | Status: DC

## 2015-11-28 NOTE — Progress Notes (Signed)
Subjective:  Patient ID: Crystal Burns, female    DOB: [birth year]  Age: 35 y.o. MRN: [id number]  CC: Conjunctivitis (right eye sealed shut this morning) and Hypertension   HPI Crystal Burns history 35 year old female with history of hypertension (diet controlled0who comes into the clinic complaining of 1 day history of redness and itching of both eyes with associated purulent discharge. She has also noticed a sense of fullness of the right side of her head, ears clogged but denies runny nose or sore throat.  She also complains she has had elevated blood pressures and has had to take her antihypertensives (which were previously on hold) intermittently. Complains of shortness of breath, intermittent chest pains, increased anxiety.  She endorses being stressed as her Zena[employer] Niecevan broke down and she is having to pay someone to take her to work, 4 of her 6 kids start school next week, she has to move out of her apartment in a hurry  Past Medical History:  Diagnosis Date  . Asthma    2010-only bothers pt during bad cold  . Depression    2010-had when mother passed away  . Gestational diabetes    2012  . History of gonorrhea   . Hypertension    2004  . Obesity   . Pregnancy induced hypertension     Past Surgical History:  Procedure Laterality Date  . CHOLECYSTECTOMY    . DILATION AND CURETTAGE OF UTERUS    . WISDOM TOOTH EXTRACTION       Outpatient Medications Prior to Visit  Medication Sig Dispense Refill  . ibuprofen (ADVIL,MOTRIN) 600 MG tablet Take 600 mg by mouth every 6 (six) hours as needed for mild pain.    . medroxyPROGESTERone (DEPO-PROVERA) 150 MG/ML injection Inject 1 mL (150 mg total) into the muscle every 3 (three) months. 150 mL 3  . omeprazole (PRILOSEC) 20 MG capsule Take 1 capsule (20 mg total) by mouth daily. 30 capsule 0  . albuterol (PROVENTIL HFA;VENTOLIN HFA) 108 (90 Base) MCG/ACT inhaler Inhale 2 puffs into the lungs every 6 (six) hours as needed for  wheezing or shortness of breath. (Patient not taking: Reported on 11/28/2015) 1 Inhaler 0  . amoxicillin (AMOXIL) 875 MG tablet Take 1 tablet (875 mg total) by mouth 2 (two) times daily. (Patient not taking: Reported on 11/28/2015) 20 tablet 0  . ergocalciferol (VITAMIN D2) 50000 units capsule Take 1 capsule (50,000 Units total) by mouth once a week. (Patient not taking: Reported on 11/28/2015) 9 capsule 0  . oxyCODONE-acetaminophen (PERCOCET/ROXICET) 5-325 MG tablet Take 1-2 tablets by mouth every 6 (six) hours as needed for severe pain. (Patient not taking: Reported on 11/28/2015) 15 tablet 0  . oxyCODONE-acetaminophen (PERCOCET/ROXICET) 5-325 MG tablet Take 1 tablet by mouth every 6 (six) hours as needed for severe pain. (Patient not taking: Reported on 11/28/2015) 15 tablet 0   No facility-administered medications prior to visit.     ROS Review of Systems  Constitutional: Negative for activity change and appetite change.  HENT: Positive for sinus pressure. Negative for sore throat.   Eyes: Positive for discharge, redness and itching.  Respiratory: Positive for chest tightness and shortness of breath. Negative for wheezing.   Cardiovascular: Negative for chest pain and palpitations.  Gastrointestinal: Negative for abdominal distention, abdominal pain and constipation.  Genitourinary: Negative.   Musculoskeletal: Negative.   Neurological: Positive for headaches.  Psychiatric/Behavioral: Negative for behavioral problems and dysphoric mood. The patient is nervous/anxious.     Objective:  BP  137/87 (BP Location: Right Arm, Patient Position: Sitting, Cuff Size: Large)   Pulse 76   Temp 98.8 F (37.1 C)   Ht (P) 5\' 3"  (1.6 m)   Wt (P) 248 lb 12.8 oz (112.9 kg)   SpO2 100%   BMI (P) 44.07 kg/m   BP/Weight 11/28/2015 10/30/2015 10/26/2015  Systolic BP 137 145 125  Diastolic BP 87 92 73  Wt. (Lbs) - - -  BMI - - 44.32      Physical Exam  Constitutional: She is oriented to person, place,  and time. She appears well-developed and well-nourished.  Eyes:  Erythema bulbar conjunctiva bilaterally; left worse than right  Cardiovascular: Normal rate, normal heart sounds and intact distal pulses.   No murmur heard. Pulmonary/Chest: Effort normal and breath sounds normal. She has no wheezes. She has no rales. She exhibits no tenderness.  Abdominal: Soft. Bowel sounds are normal. She exhibits no distension and no mass. There is no tenderness.  Musculoskeletal: Normal range of motion.  Neurological: She is alert and oriented to person, place, and time.     Assessment & Plan:   1. Bilateral conjunctivitis - ofloxacin (OCUFLOX) 0.3 % ophthalmic solution; Place 1 drop into both eyes 4 (four) times daily.  Dispense: 5 mL; Refill: 0  2. Essential hypertension Blood pressure is normal She is currently not on any antihypertensives and has been encouraged to keep a blood pressure log which will be reviewed at her next visit She has several psych stressors at this time which could explain intermittent elevation of blood pressure  3. Sinus congestion This could explain headaches and ear symptoms - loratadine (CLARITIN) 10 MG tablet; Take 1 tablet (10 mg total) by mouth daily.  Dispense: 30 tablet; Refill: 1 - fluticasone (FLONASE) 50 MCG/ACT nasal spray; Place 1 spray into both nostrils daily.  Dispense: 16 g; Refill: 1  4. Panic attacks Her shortness of breath and chest pains could come from panic attacks. Previous and hydroxyzine which she stopped  Case manager called in to see the patient to assist with social issues as this is affecting her medically with presentations informal somatic symptoms. We'll review the symptoms again at her next visit.  Meds ordered this encounter  Medications  . ofloxacin (OCUFLOX) 0.3 % ophthalmic solution    Sig: Place 1 drop into both eyes 4 (four) times daily.    Dispense:  5 mL    Refill:  0  . loratadine (CLARITIN) 10 MG tablet    Sig: Take 1  tablet (10 mg total) by mouth daily.    Dispense:  30 tablet    Refill:  1  . fluticasone (FLONASE) 50 MCG/ACT nasal spray    Sig: Place 1 spray into both nostrils daily.    Dispense:  16 g    Refill:  1    Follow-up: Return in about 2 weeks (around 12/12/2015) for follow up on Hypertension.   Jaclyn ShaggyEnobong Amao MD

## 2015-11-28 NOTE — Telephone Encounter (Signed)
Met with the patient at the request of Dr Jarold Song when she was in the [facility] clinic today.  She reported financial struggles, including needing a deposit for a new apartment, having to move, paying her current bills, having to obtain school supplies for her children, needing to have her car repaired, trying to work and not having access to her car.  She agreed to have this CM contact the SW at [facility] to discuss her needs/concerns and then have this CM or the SW contact her with any information that might help her.  Call placed to Casimer Lanius, CSW at [facility] # [phone] and a message was left requesting a return call.

## 2015-11-28 NOTE — Progress Notes (Signed)
Both eyes red and "itchy" C/O hypertension- chest pain, SOB.  Takes BP at home and states it fluctuates   169/109 "takes an hour and it comes back down" Weight gain Takes BP medication when her pressure is up "pressure in head" Constipated Med refill

## 2015-11-29 ENCOUNTER — Telehealth: Payer: Self-pay

## 2015-11-29 NOTE — Telephone Encounter (Signed)
Call placed to Myer PeerKim Lanier, [facility] Liaison at [facility] # [phone] and she confirmed that the patient [place] Access [place] medicaid and is [facility] a [facility], LPN.  [facility] # [phone] to inquire what services are being provided to the patient and inform her of the concerns that the patient voiced when she was in the clinic yesterday - financial struggles, school supplies for her children, moving expenses, car repairs, transportation to work.  Voicemail message left requesting a call back to # [phone] or [phone]

## 2015-11-30 ENCOUNTER — Telehealth: Payer: Self-pay

## 2015-11-30 NOTE — Telephone Encounter (Signed)
Kiouna from [facility] returned your call. Please f/u

## 2015-11-30 NOTE — Telephone Encounter (Signed)
Patient receiving [facility] Management [facility]. This Case Manager placed call to [facility] [phone]), Case Manager with [facility], to inform her of concerns and social needs that patient voiced at her appointment with Dr. Venetia NightAmao on 11/28/15. Informed Shona NeedlesKiouna that per Robyne PeersJane Brazeau, RN CM patient indicated she was having financial struggles-having difficulty paying bills, obtaining deposit for a new apartment, obtaining school supplies for her children, and getting her car repaired. Per Robyne PeersJane Brazeau, patient having difficulty getting to work because her car needs to be repaired. Shona NeedlesKiouna indicated she had initial conversation with patient on 11/21/15, and patient did not inform her of above concerns. Shona NeedlesKiouna indicated she will reach out to patient and will give her resources to area food pantries, [employer], and [employer]. She also indicated she would try to obtain resources to assist getting her children needed school supplies and getting patient transportation to work.  In addition, this Case Manager [facility] that Dr. Venetia NightAmao thinks patient would benefit from tele-monitoring services to monitor her blood pressure. Inquired if patient would qualify for tele-monitoring services, and Shona NeedlesKiouna indicated she would check with tele-monitoring nurse to determine if patient meets criteria for tele-monitoring services. Awaiting return call to determine if patient qualifies for tele-monitoring services.

## 2015-12-05 ENCOUNTER — Telehealth: Payer: Self-pay

## 2015-12-05 ENCOUNTER — Encounter: Payer: Self-pay | Admitting: Family Medicine

## 2015-12-05 ENCOUNTER — Ambulatory Visit: Payer: Medicaid Other | Attending: Family Medicine | Admitting: Family Medicine

## 2015-12-05 VITALS — BP 137/91 | HR 73 | Temp 98.4°F | Wt 246.0 lb

## 2015-12-05 DIAGNOSIS — Z9889 Other specified postprocedural states: Secondary | ICD-10-CM | POA: Insufficient documentation

## 2015-12-05 DIAGNOSIS — Z3049 Encounter for surveillance of other contraceptives: Secondary | ICD-10-CM

## 2015-12-05 DIAGNOSIS — R0981 Nasal congestion: Secondary | ICD-10-CM

## 2015-12-05 DIAGNOSIS — M940 Chondrocostal junction syndrome [Tietze]: Secondary | ICD-10-CM | POA: Diagnosis not present

## 2015-12-05 DIAGNOSIS — Z308 Encounter for other contraceptive management: Secondary | ICD-10-CM | POA: Diagnosis not present

## 2015-12-05 DIAGNOSIS — Z888 Allergy status to other drugs, medicaments and biological substances status: Secondary | ICD-10-CM | POA: Diagnosis not present

## 2015-12-05 DIAGNOSIS — Z79899 Other long term (current) drug therapy: Secondary | ICD-10-CM | POA: Insufficient documentation

## 2015-12-05 DIAGNOSIS — I10 Essential (primary) hypertension: Secondary | ICD-10-CM | POA: Diagnosis not present

## 2015-12-05 DIAGNOSIS — K219 Gastro-esophageal reflux disease without esophagitis: Secondary | ICD-10-CM | POA: Diagnosis not present

## 2015-12-05 DIAGNOSIS — F411 Generalized anxiety disorder: Secondary | ICD-10-CM | POA: Insufficient documentation

## 2015-12-05 MED ORDER — FLUTICASONE PROPIONATE 50 MCG/ACT NA SUSP: 1.0000 | Freq: Every day | NASAL | 1 refills | Status: DC

## 2015-12-05 MED ORDER — OMEPRAZOLE 20 MG PO CPDR: 20.0000 mg | DELAYED_RELEASE_CAPSULE | Freq: Every day | ORAL | 1 refills | Status: DC

## 2015-12-05 MED ORDER — LORATADINE 10 MG PO TABS: 10.0000 mg | ORAL_TABLET | Freq: Every day | ORAL | 1 refills | Status: DC

## 2015-12-05 NOTE — Telephone Encounter (Signed)
Call placed to [facility], LPN with [id number] # [phone] to inquire if she has been able speak to the patient about her financial concerns.  Voicemail message left requesting a call back to # [phone] or [phone].

## 2015-12-05 NOTE — Progress Notes (Signed)
Subjective:  Patient ID: Crystal Burns, female    DOB: [birth year]  Age: 35 y.o. MRN: [id number]  CC: Hypertension and Shortness of Breath   HPI Crystal Burns is a 35 year old female with history of obesity, hypertension, anxiety, and at her last office visit was treated for bilateral conjunctivitis however she never picked up her eyedrops due to the fact that her Medicaid was inactivated but eye symptoms have resolved at this time. She had also complained of sinus congestion, ear symptoms and I had placed her on Claritin which she also has not picked up. Today she informs me she feels like she cannot breathe through her nose and has some shortness of breath due to that. She also has intermittent chest pains and has been diagnosed with costochondritis in the past.  She continues to suffer from anxiety and is under a lot of stress from social stressors: Having to move out of the house, caring for 6 kids, lack of a mode of transportation. I was informed by the social worker that she is being managed by [facility] as well.  She brings in her blood pressure log informing me that her blood pressure has been high on a couple of occasions. Most of her blood pressure numbers are under 140 systolic however she does have a few elevations in the 140-170 range systolic and she informs me she had taken her blood pressure at work and this was elevated, so she took an antihypertensive and also when she got home. 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 Her antihypertensives have been on hold due to the fact that she had hypotension and dizziness while on them.  Past Medical History:  Diagnosis Date  . Asthma    2010-only bothers pt during bad cold  . Depression    2010-had when mother passed away  . Gestational diabetes    2012  . History of gonorrhea   . Hypertension    2004  . Obesity   . Pregnancy induced hypertension      Past Surgical History:  Procedure Laterality Date  . CHOLECYSTECTOMY    . DILATION AND CURETTAGE OF UTERUS    . WISDOM TOOTH EXTRACTION      Allergies  Allergen Reactions  . Bee Venom Hives and Swelling  . Lisinopril Shortness Of Breath and Cough  . Shellfish Allergy Anaphylaxis     Outpatient Medications Prior to Visit  Medication Sig Dispense Refill  . ibuprofen (ADVIL,MOTRIN) 600 MG tablet Take 600 mg by mouth every 6 (six) hours as needed for mild pain.    . medroxyPROGESTERone (DEPO-PROVERA) 150 MG/ML injection Inject 1 mL (150 mg total) into the muscle every 3 (three) months. 150 mL 3  . ofloxacin (OCUFLOX) 0.3 % ophthalmic solution Place 1 drop into both eyes 4 (four) times daily. 5 mL 0  . fluticasone (FLONASE) 50 MCG/ACT nasal spray Place 1 spray into both nostrils daily. 16 g 1  . loratadine (CLARITIN) 10 MG tablet Take 1 tablet (10 mg total) by mouth daily. 30 tablet 1  . omeprazole (PRILOSEC) 20 MG capsule Take 1 capsule (20 mg total) by mouth daily. 30 capsule 0   No facility-administered medications prior to visit.     ROS Review of Systems Constitutional: Negative for activity change and appetite change.  HENT: See history of present illness.   Eyes: Negative for visual complaints. Respiratory: Positive for chest tightness  and shortness of breath. Negative for wheezing.   Cardiovascular: Negative for chest pain and palpitations.  Gastrointestinal: Negative for abdominal distention, abdominal pain and constipation.  Genitourinary: Negative.   Musculoskeletal: Negative.   Neurological: Negative for numbness Psychiatric/Behavioral: Negative for behavioral problems and dysphoric mood. The patient is nervous/anxious.    Objective:  BP (!) 137/91 (BP Location: Left Arm, Patient Position: Sitting, Cuff Size: Large)   Pulse 73   Temp 98.4 F (36.9 C) (Oral)   Wt 246 lb (111.6 kg)   SpO2 99%   BMI (P) 43.58 kg/m   BP/Weight 12/05/2015 11/28/2015 10/30/2015   Systolic BP 137 137 145  Diastolic BP 91 87 92  Wt. (Lbs) 246 - -  BMI 43.58 - -      Physical Exam  Constitutional: She is oriented to person, place, and time. She appears well-developed and well-nourished.  Cardiovascular: Normal rate, normal heart sounds and intact distal pulses.   No murmur heard. Pulmonary/Chest: Effort normal and breath sounds normal. She has no wheezes. She has no rales (reproducible chest wall tenderness on the left side). She exhibits tenderness.  Abdominal: Soft. Bowel sounds are normal. She exhibits no distension and no mass. There is no tenderness.  Musculoskeletal: Normal range of motion.  Neurological: She is alert and oriented to person, place, and time.     Assessment & Plan:   1. Sinus congestion - loratadine (CLARITIN) 10 MG tablet; Take 1 tablet (10 mg total) by mouth daily.  Dispense: 30 tablet; Refill: 1 - fluticasone (FLONASE) 50 MCG/ACT nasal spray; Place 1 spray into both nostrils daily.  Dispense: 16 g; Refill: 1  2. Costochondritis Advised to use NSAIDS  3. Anxiety state This could explain all the symptoms she is having coupled with underlying stress Multiple social stressors which precipitated this. I placed her on hydroxyzine in the past which she never took  4. Essential hypertension Blood pressure has been normal in the clinic; today she has a slight diastolic elevation Low-sodium, DASH diet and weight loss Advised to continue to keep blood pressure log Discussed proper method of measuring blood pressure: Is initially elevated advised to repeat blood pressure again in; not to be taken when she is in a rush or is dealing  5. Encounter for surveillance of other contraceptive Refer her to GYN for tubal ligation as the patient request - Ambulatory referral to Gynecology  6. Gastroesophageal reflux disease without esophagitis Due to gas see feeling of bloating I am placing her on a PPI - omeprazole (PRILOSEC) 20 MG capsule; Take  1 capsule (20 mg total) by mouth daily.  Dispense: 30 capsule; Refill: 1   Meds ordered this encounter  Medications  . omeprazole (PRILOSEC) 20 MG capsule    Sig: Take 1 capsule (20 mg total) by mouth daily.    Dispense:  30 capsule    Refill:  1  . loratadine (CLARITIN) 10 MG tablet    Sig: Take 1 tablet (10 mg total) by mouth daily.    Dispense:  30 tablet    Refill:  1  . fluticasone (FLONASE) 50 MCG/ACT nasal spray    Sig: Place 1 spray into both nostrils daily.    Dispense:  16 g    Refill:  1    Follow-up: One month follow-up on GERD   Jaclyn Shaggy MD

## 2015-12-05 NOTE — Progress Notes (Signed)
C/C; blood pressure been high, pt having chest pains, and real bad gas

## 2015-12-06 ENCOUNTER — Telehealth: Payer: Self-pay

## 2015-12-06 NOTE — Telephone Encounter (Signed)
This Case Manager received phone call from [facility], Case Manager with [facility]. Shona NeedlesKiouna indicated she called patient on 12/04/15 to provide community resources; however, patient informed her she would have to call her back at a later time. Shona NeedlesKiouna indicated patient has not yet called her back. She said she will follow patient and continue to try to reach her for 30 days. She also indicated she spoke with Demetra ShinerAnne Rudd, RN CM, and she indicated patient does not qualify for tele-monitoring services. Will route encounter to Dr. Venetia NightAmao so she is aware.

## 2015-12-07 ENCOUNTER — Telehealth: Payer: Self-pay | Admitting: Family Medicine

## 2015-12-07 ENCOUNTER — Telehealth: Payer: Self-pay | Admitting: *Deleted

## 2015-12-07 NOTE — Telephone Encounter (Signed)
Call returned message left to call office. 

## 2015-12-07 NOTE — Telephone Encounter (Signed)
Crystal Burns form [facility] called requesting to speak with pt. Nurse regarding the request for telemonitoring for the pt.  Please f/u

## 2015-12-08 NOTE — Telephone Encounter (Signed)
There is a documentation in her chart form 8/30 from [facility]  indicating she did not qualify for telemetry monitoring.

## 2015-12-08 NOTE — Telephone Encounter (Signed)
Writer spoke with Crystal ShinerAnne Rudd and she was just verifying from Dr. Jen MowAmao's note that patient does not qualify for tele monitoring.  Writer verified this with her.

## 2015-12-12 ENCOUNTER — Telehealth: Payer: Self-pay | Admitting: *Deleted

## 2015-12-12 NOTE — Telephone Encounter (Signed)
Patient called in wanted a follow up call from Friday's phone call.Marland Kitchen..Marland Kitchen

## 2015-12-12 NOTE — Telephone Encounter (Signed)
Pt called back requesting to speak with Erskine SquibbJane. Pt was advised that Erskine SquibbJane was busy with patients. Patient wants Erskine SquibbJane to return call as soon as possible, it's "very important." Please advise.

## 2015-12-13 ENCOUNTER — Telehealth: Payer: Self-pay | Admitting: Family Medicine

## 2015-12-13 ENCOUNTER — Telehealth: Payer: Self-pay | Admitting: *Deleted

## 2015-12-13 NOTE — Telephone Encounter (Signed)
Called patient- left message on voice mail that I have a list of resources- but that is about all the things I know to do for her at this time.

## 2015-12-13 NOTE — Telephone Encounter (Signed)
Attempted to return patient call.Message left that she has medication called to her pharmacy.

## 2015-12-13 NOTE — Telephone Encounter (Signed)
Pt. Called stating that she just found out that their is mold in her house.  Pt. Would like to know if the mold is making her have chest pain,SOB, Light headed and makes her BP go high. Pt. States the mold has been in the house For almost a year. Please f/u with pt.

## 2015-12-14 ENCOUNTER — Telehealth: Payer: Self-pay | Admitting: *Deleted

## 2015-12-14 NOTE — Telephone Encounter (Signed)
Spoke with Patient and let her know the resources available to her at this time and was told to give her the list on her voicemail. She still insists on speaking with Erskine SquibbJane and I told her I would give her the message and she would give her a call asap.

## 2015-12-14 NOTE — Telephone Encounter (Signed)
She has to discuss the issue of mold with her landlord. At our previous visits I did explain to her the causes of all her other symptoms.

## 2015-12-18 NOTE — Telephone Encounter (Signed)
Writer spoke with patient today in regards to her mold issue per Dr. Venetia NightAmao.  Patient realizes that the mold has nothing to do with her symptoms and has spoken to the landlord. Patient states that her BP is going up in the evening and is requesting historical medication- cozaar 25 mg which was prescribed on 07/07/15.  She states that she takes it intermittently when her BP spikes.  Although she is scheduled in the walk-in clinic tomorrow she "wants the medicine sent in to her [facility] today" . Writer told her that a note for requested medication will be sent to Dr. Venetia NightAmao.  [facility] states that [facility] last week.

## 2015-12-19 ENCOUNTER — Other Ambulatory Visit: Payer: Self-pay | Admitting: Pharmacist

## 2015-12-19 ENCOUNTER — Ambulatory Visit: Payer: Medicaid Other | Attending: Family Medicine | Admitting: Physician Assistant

## 2015-12-19 ENCOUNTER — Telehealth: Payer: Self-pay | Admitting: Family Medicine

## 2015-12-19 VITALS — BP 135/84 | HR 82 | Temp 98.2°F | Ht 63.0 in | Wt 244.0 lb

## 2015-12-19 DIAGNOSIS — R0789 Other chest pain: Secondary | ICD-10-CM

## 2015-12-19 DIAGNOSIS — I10 Essential (primary) hypertension: Secondary | ICD-10-CM | POA: Insufficient documentation

## 2015-12-19 DIAGNOSIS — F419 Anxiety disorder, unspecified: Secondary | ICD-10-CM | POA: Diagnosis not present

## 2015-12-19 DIAGNOSIS — R030 Elevated blood-pressure reading, without diagnosis of hypertension: Secondary | ICD-10-CM

## 2015-12-19 DIAGNOSIS — J3089 Other allergic rhinitis: Secondary | ICD-10-CM

## 2015-12-19 DIAGNOSIS — H6692 Otitis media, unspecified, left ear: Secondary | ICD-10-CM | POA: Insufficient documentation

## 2015-12-19 DIAGNOSIS — E669 Obesity, unspecified: Secondary | ICD-10-CM | POA: Insufficient documentation

## 2015-12-19 DIAGNOSIS — H6592 Unspecified nonsuppurative otitis media, left ear: Secondary | ICD-10-CM

## 2015-12-19 DIAGNOSIS — J302 Other seasonal allergic rhinitis: Secondary | ICD-10-CM | POA: Diagnosis not present

## 2015-12-19 DIAGNOSIS — IMO0001 Reserved for inherently not codable concepts without codable children: Secondary | ICD-10-CM

## 2015-12-19 MED ORDER — LOSARTAN POTASSIUM 25 MG PO TABS: 25.0000 mg | ORAL_TABLET | Freq: Every day | ORAL | 1 refills | Status: DC

## 2015-12-19 MED ORDER — AMOXICILLIN-POT CLAVULANATE 875-125 MG PO TABS: 1.0000 | ORAL_TABLET | Freq: Two times a day (BID) | ORAL | 0 refills | Status: DC

## 2015-12-19 MED ORDER — NAPROXEN 500 MG PO TBEC: 500.0000 mg | DELAYED_RELEASE_TABLET | Freq: Two times a day (BID) | ORAL | 0 refills | Status: DC

## 2015-12-19 NOTE — Telephone Encounter (Signed)
Losartan is not covered by medicaid. Patient would like to be prescribed something covered by medicaid.

## 2015-12-19 NOTE — Progress Notes (Signed)
Chief Complaint: "I have a lot going on"  Subjective: This is a 35 yo female with hx of elevated blood pressure, obesity and anxiety that presents with multiple complaints:  I will address 3 today:  1. Elevated BP. Has taken Cozaar 25 mg prn in past for elevated numbers. She has a lot of stressors right now but is insisting starting antihypertensive meds. She brought a log and the range is 117-160/78-101.   2. Allergies. Has Flonase and Claritin but has not been taken regularly. Complaining of fullness in the ears especially on the left. No drainage. Hearing ok.    3. Chest pain. Left chest. Beneath the breast. Radiates to shoulder at times. Positional, like hurts with certain movements. No pain with inspiration. And No SHOB.    ROS:  GEN: denies fever or chills, denies change in weight Skin: denies lesions or rashes HEENT: + headache, +earache, epistaxis, sore throat, or neck pain LUNGS: denies SHOB, dyspnea, PND, orthopnea CV: + CP or palpitations ABD: denies abd pain, N or V EXT: denies muscle spasms or swelling; no pain in lower ext, no weakness NEURO: denies numbness or tingling, denies sz, stroke or TIA   Objective:  Vitals:   12/19/15 1437  BP: 135/84  Pulse: 82  Temp: 98.2 F (36.8 C)  TempSrc: Oral  SpO2: 98%  Weight: 244 lb (110.7 kg)  Height: 5\' 3"  (1.6 m)    Physical Exam:  General: in no acute distress. HEENT: no pallor, no icterus, moist oral mucosa, no JVD, no lymphadenopathy; left ear erythematous and tender to movement, minimal cerumen Heart: Normal  s1 &s2  Regular rate and rhythm, without murmurs, rubs, gallops. Lungs: Clear to auscultation bilaterally. Abdomen: Soft, nontender, nondistended, positive bowel sounds. Extremities: No clubbing cyanosis or edema with positive pedal pulses. Neuro: Alert, awake, oriented x3, nonfocal.  Pertinent Lab Results:none   Medications: Prior to Admission medications   Medication Sig Start Date End Date  Taking? Authorizing Provider  fluticasone (FLONASE) 50 MCG/ACT nasal spray Place 1 spray into both nostrils daily. 12/05/15  Yes Jaclyn Shaggy, MD  ibuprofen (ADVIL,MOTRIN) 600 MG tablet Take 600 mg by mouth every 6 (six) hours as needed for mild pain.   Yes Historical Provider, MD  loratadine (CLARITIN) 10 MG tablet Take 1 tablet (10 mg total) by mouth daily. 12/05/15  Yes Jaclyn Shaggy, MD  medroxyPROGESTERone (DEPO-PROVERA) 150 MG/ML injection Inject 1 mL (150 mg total) into the muscle every 3 (three) months. 02/27/15  Yes Rachelle A Denney, CNM  omeprazole (PRILOSEC) 20 MG capsule Take 1 capsule (20 mg total) by mouth daily. 12/05/15  Yes Jaclyn Shaggy, MD  amoxicillin-clavulanate (AUGMENTIN) 875-125 MG tablet Take 1 tablet by mouth 2 (two) times daily. 12/19/15   Ledonna Dormer Netta Cedars, PA-C  losartan (COZAAR) 25 MG tablet Take 1 tablet (25 mg total) by mouth daily. 12/19/15   Sadeen Wiegel Netta Cedars, PA-C  naproxen (EC-NAPROSYN) 500 MG EC tablet Take 1 tablet (500 mg total) by mouth 2 (two) times daily with a meal. 12/19/15   Majorie Santee Netta Cedars, PA-C  ofloxacin (OCUFLOX) 0.3 % ophthalmic solution Place 1 drop into both eyes 4 (four) times daily. Patient not taking: Reported on 12/19/2015 11/28/15   Jaclyn Shaggy, MD    Assessment: 1. Elevated BP/Mild HTN 2. Allergies/Left OM 3. Atypical CP, likely MSK  Plan: She is insisting that meds be initiated. Cont DASH diet. Try Cozaar 25 mg daily. Keep a log and return in 2 weeks for review. Augmentin; cont Flonase and  Claritin  Naprosyn prn  Follow up:2 weeks with Dr. Venetia NightAmao  The patient was given clear instructions to go to ER or return to medical center if symptoms don't improve, worsen or new problems develop. The patient verbalized understanding. The patient was told to call to get lab results if they haven't heard anything in the next week.   This note has been created with Education officer, environmentalDragon speech recognition software and smart phrase technology. Any transcriptional errors are  unintentional.   Scot Juniffany Mecca Guitron, PA-C 12/19/2015, 3:07 PM

## 2015-12-19 NOTE — Progress Notes (Signed)
"  mold issues in her home' cold symptoms Depo shot?

## 2015-12-19 NOTE — Telephone Encounter (Signed)
Losartan is covered by Medicaid but a prior authorization is needed. I have completed that and it should be approved within the next 24 hours. Once approved, I will notify pharmacy to re-process it.

## 2015-12-19 NOTE — Telephone Encounter (Signed)
error 

## 2015-12-28 ENCOUNTER — Telehealth: Payer: Self-pay

## 2015-12-28 NOTE — Telephone Encounter (Signed)
Kiouna called and stated she willl not be able to attempt to help the patient anymore because patient medicaid is medicaid straight now, but otherwise pt has not return any of her calls.

## 2015-12-28 NOTE — Telephone Encounter (Signed)
Noted  

## 2016-01-02 ENCOUNTER — Ambulatory Visit: Payer: Medicaid Other | Admitting: Family Medicine

## 2016-01-09 ENCOUNTER — Telehealth: Payer: Self-pay | Admitting: Family Medicine

## 2016-01-09 NOTE — Telephone Encounter (Signed)
Patient needs birth control pills and/ depo shot  Please follow up.

## 2016-01-10 NOTE — Telephone Encounter (Signed)
Please schedule her for a Depo shot if she is due for one depending on the timing of her last shot here in the clinic. I had also referred her to GYN as per her request for tubal ligation.

## 2016-01-11 MED ORDER — NORETHINDRONE 0.35 MG PO TABS: 1.0000 | ORAL_TABLET | Freq: Every day | ORAL | 11 refills | Status: DC

## 2016-01-11 NOTE — Telephone Encounter (Signed)
Writer LVM on patient's cell phone to inform her that MD sent over a prescription for requested BC Pills.  Patient encouraged to call back with questions.

## 2016-01-11 NOTE — Telephone Encounter (Signed)
Prescription sent to the pharmacy.

## 2016-01-11 NOTE — Addendum Note (Signed)
Addended by: Jaclyn ShaggyAMAO, Amal Renbarger on: 01/11/2016 01:56 PM   Modules accepted: Orders

## 2016-01-11 NOTE — Telephone Encounter (Signed)
Patient does not want to get a depo injection because she states that she breakthrough bleeds.  [facility] she can get into have the tubal ligation.  She has been without BC since July.

## 2016-01-16 ENCOUNTER — Encounter: Payer: Self-pay | Admitting: Cardiovascular Disease

## 2016-01-16 ENCOUNTER — Ambulatory Visit (INDEPENDENT_AMBULATORY_CARE_PROVIDER_SITE_OTHER): Payer: Medicaid Other | Admitting: Cardiovascular Disease

## 2016-01-16 VITALS — BP 134/74 | HR 72 | Ht 63.0 in | Wt 248.0 lb

## 2016-01-16 DIAGNOSIS — I10 Essential (primary) hypertension: Secondary | ICD-10-CM | POA: Diagnosis not present

## 2016-01-16 DIAGNOSIS — R079 Chest pain, unspecified: Secondary | ICD-10-CM | POA: Diagnosis not present

## 2016-01-16 DIAGNOSIS — R0789 Other chest pain: Secondary | ICD-10-CM | POA: Insufficient documentation

## 2016-01-16 NOTE — Progress Notes (Signed)
01/16/2016 Crystal Burns   1980/10/22  [id number]  Primary Physician Jaclyn ShaggyEnobong, Amao, MD Primary Cardiologist: Runell GessJonathan J Berry MD Roseanne RenoFACP, FACC, FAHA, FSCAI  HPI:  [name] is a 35 year old severely overweight divorced African-American female mother of 6 daughters, who works as a Occupational psychologistcustomer service representative. [employer]. She was referred by Dr. Yetta BarreJones for cardiovascular evaluation because of atypical chest pain. Risk factors include treated hypertension and family history with a mother who died suddenly of a myocardial infarction at age 35. This occurred 6 years ago. She is still grieving. She is also a single mom is under a lot of stress at home. She developed atypical chest pain partially 5 months ago which is left, inframammary, sharp and fleeting. It occurs multiple times a day. She also complains of left neck and shoulder pain which sounds radicular.   Current Outpatient Prescriptions  Medication Sig Dispense Refill  . loratadine (CLARITIN) 10 MG tablet Take 1 tablet (10 mg total) by mouth daily. 30 tablet 1  . losartan (COZAAR) 25 MG tablet Take 1 tablet (25 mg total) by mouth daily. 30 tablet 1  . naproxen (EC-NAPROSYN) 500 MG EC tablet Take 1 tablet (500 mg total) by mouth 2 (two) times daily with a meal. 30 tablet 0  . omeprazole (PRILOSEC) 20 MG capsule Take 1 capsule (20 mg total) by mouth daily. 30 capsule 1   No current facility-administered medications for this visit.     Allergies  Allergen Reactions  . Bee Venom Hives and Swelling  . Lisinopril Shortness Of Breath and Cough  . Shellfish Allergy Anaphylaxis    Social History   Social History  . Marital status: Divorced    Spouse name: N/A  . Number of children: N/A  . Years of education: N/A   Occupational History  . Not on file.   Social History Main Topics  . Smoking status: Former Smoker    Types: Cigarettes    Quit date: 10/31/2010  . Smokeless tobacco: Never Used  . Alcohol use No    . Drug use: No  . Sexual activity: Not on file   Other Topics Concern  . Not on file   Social History Narrative  . No narrative on file     Review of Systems: General: negative for chills, fever, night sweats or weight changes.  Cardiovascular: negative for chest pain, dyspnea on exertion, edema, orthopnea, palpitations, paroxysmal nocturnal dyspnea or shortness of breath Dermatological: negative for rash Respiratory: negative for cough or wheezing Urologic: negative for hematuria Abdominal: negative for nausea, vomiting, diarrhea, bright red blood per rectum, melena, or hematemesis Neurologic: negative for visual changes, syncope, or dizziness All other systems reviewed and are otherwise negative except as noted above.    Blood pressure 134/74, pulse 72, height 5\' 3"  (1.6 m), weight 248 lb (112.5 kg), not currently breastfeeding.  General appearance: alert and no distress Neck: no adenopathy, no carotid bruit, no JVD, supple, symmetrical, trachea midline and thyroid not enlarged, symmetric, no tenderness/mass/nodules Lungs: clear to auscultation bilaterally Heart: regular rate and rhythm, S1, S2 normal, no murmur, click, rub or gallop Extremities: extremities normal, atraumatic, no cyanosis or edema  EKG normal sinus rhythm with evidence of LVH, early repolarization changes and poor R-wave progression consistent with an old anterolateral infarct. I suspect this is just lead placement. I personally reviewed this EKG  ASSESSMENT AND PLAN:   Atypical chest pain Patient was referred by Dr. Yetta BarreJones for atypical chest pain. Risk factors  include hypertension and family history. Her mother died of a heart attack suddenly at age 62. The last 5 months she's had atypical chest pain. The pain occurs multiple times a day, and sharp and fleeting. She also has left neck and left upper extremity discomfort as well which sounds more radicular in nature. I'm going to get a routine GXT to further  risk stratify her.  Essential hypertension History of hypertension blood pressure measured 134/74. She is on losartan. Continue current meds at current dosing. She is aware of a salt restricted diet.      Runell Gess MD FACP,FACC,FAHA, [facility] 01/16/2016 10:22 AM

## 2016-01-16 NOTE — Assessment & Plan Note (Signed)
History of hypertension blood pressure measured 134/74. She is on losartan. Continue current meds at current dosing. She is aware of a salt restricted diet.

## 2016-01-16 NOTE — Assessment & Plan Note (Signed)
Patient was referred by Dr. Yetta BarreJones for atypical chest pain. Risk factors include hypertension and family history. Her mother died of a heart attack suddenly at age 35. The last 5 months she's had atypical chest pain. The pain occurs multiple times a day, and sharp and fleeting. She also has left neck and left upper extremity discomfort as well which sounds more radicular in nature. I'm going to get a routine GXT to further risk stratify her.

## 2016-01-16 NOTE — Patient Instructions (Addendum)
Medication Instructions:  NO CHANGES.  Labwork: Labwork will be requested from your primary care physician.   Testing/Procedures: Your physician has requested that you have an exercise tolerance test. For further information please visit [email or web address].     Follow-Up: Your physician recommends that you schedule a follow-up appointment in: AS NEEDED.   Exercise Stress Electrocardiogram An exercise stress electrocardiogram is a test that is done to evaluate the blood supply to your heart. This test may also be called exercise stress electrocardiography. The test is done while you are walking on a treadmill. The goal of this test is to raise your heart rate. This test is done to find areas of poor blood flow to the heart by determining the extent of coronary artery disease (CAD).   CAD is defined as narrowing in one or more heart (coronary) arteries of more than 70%. If you have an abnormal test result, this may mean that you are not getting adequate blood flow to your heart during exercise. Additional testing may be needed to understand why your test was abnormal. LET [facility] KNOW ABOUT:   Any allergies you have.  All medicines you are taking, including vitamins, herbs, eye drops, creams, and over-the-counter medicines.  Previous problems you or members of your family have had with the use of anesthetics.  Any blood disorders you have.  Previous surgeries you have had.  Medical conditions you have.  Possibility of pregnancy, if this applies. RISKS AND COMPLICATIONS Generally, this is a safe procedure. However, as with any procedure, complications can occur. Possible complications can include:  Pain or pressure in the following areas:  Chest.  Jaw or neck.  Between your shoulder blades.  Radiating down your left arm.  Dizziness or light-headedness.  Shortness of breath.  Increased or irregular heartbeats.  Nausea or vomiting.  Heart attack  (rare). BEFORE THE PROCEDURE  Avoid all forms of caffeine 24 hours before your test or as directed by your health care provider. This includes coffee, tea (even decaffeinated tea), caffeinated sodas, chocolate, cocoa, and certain pain medicines.  Follow your health care provider's instructions regarding eating and drinking before the test.  Take your medicines as directed at regular times with water unless instructed otherwise. Exceptions may include:  If you have diabetes, ask how you are to take your insulin or pills. It is common to adjust insulin dosing the morning of the test.  If you are taking beta-blocker medicines, it is important to talk to your health care provider about these medicines well before the date of your test. Taking beta-blocker medicines may interfere with the test. In some cases, these medicines need to be changed or stopped 24 hours or more before the test.  If you wear a nitroglycerin patch, it may need to be removed prior to the test. Ask your health care provider if the patch should be removed before the test.  If you use an inhaler for any breathing condition, bring it with you to the test.  If you are an outpatient, bring a snack so you can eat right after the stress phase of the test.  Do not smoke for 4 hours prior to the test or as directed by your health care provider.  Do not apply lotions, powders, creams, or oils on your chest prior to the test.  Wear loose-fitting clothes and comfortable shoes for the test. This test involves walking on a treadmill. PROCEDURE  Multiple patches (electrodes) will be put on your chest. If  needed, small areas of your chest may have to be shaved to get better contact with the electrodes. Once the electrodes are attached to your body, multiple wires will be attached to the electrodes and your heart rate will be monitored.  Your heart will be monitored both at rest and while exercising.  You will walk on a treadmill. The  treadmill will be started at a slow pace. The treadmill speed and incline will gradually be increased to raise your heart rate. AFTER THE PROCEDURE  Your heart rate and blood pressure will be monitored after the test.  You may return to your normal schedule including diet, activities, and medicines, unless your health care provider tells you otherwise.   This information is not intended to replace advice given to you by your health care provider. Make sure you discuss any questions you have with your health care provider.   Document Released: 03/22/2000 Document Revised: 03/30/2013 Document Reviewed: 11/30/2012 Elsevier Interactive Patient Education [employer]    If you need a refill on your cardiac medications before your next appointment, please call your pharmacy.

## 2016-01-18 ENCOUNTER — Telehealth (HOSPITAL_COMMUNITY): Payer: Self-pay

## 2016-01-18 NOTE — Telephone Encounter (Signed)
Encounter complete. 

## 2016-01-19 ENCOUNTER — Ambulatory Visit: Payer: Medicaid Other | Admitting: Family Medicine

## 2016-01-21 ENCOUNTER — Encounter (HOSPITAL_COMMUNITY): Payer: Self-pay | Admitting: *Deleted

## 2016-01-21 ENCOUNTER — Ambulatory Visit (HOSPITAL_COMMUNITY)
Admission: EM | Admit: 2016-01-21 | Discharge: 2016-01-21 | Disposition: A | Discharge status: Home / Self Care | Payer: Medicaid Other | Attending: Internal Medicine | Admitting: Internal Medicine

## 2016-01-21 DIAGNOSIS — M25561 Pain in right knee: Secondary | ICD-10-CM | POA: Diagnosis not present

## 2016-01-21 DIAGNOSIS — G8929 Other chronic pain: Secondary | ICD-10-CM

## 2016-01-21 LAB — POCT PREGNANCY, URINE: Preg Test, Ur: NEGATIVE

## 2016-01-21 MED ORDER — NAPROXEN 500 MG PO TABS: 500.0000 mg | ORAL_TABLET | Freq: Two times a day (BID) | ORAL | 0 refills | Status: DC

## 2016-01-21 NOTE — ED Provider Notes (Signed)
CSN: [id number]     Arrival date & time 01/21/16  1309 History   First MD Initiated Contact with Patient 01/21/16 1415     Chief Complaint  Patient presents with  . Knee Pain   (Consider location/radiation/quality/duration/timing/severity/associated sxs/prior Treatment) Pt states that she has chronic pain to bil knees. Pain more so in the RT knee. Denies any injury states that it has swelling intermit. None noted today. Pt has had steroid injections to knee in the past. Has worked more this week than normal. Would like to have stronger pain medications. Has been taking motrin with no relief.  Strong pulses to extremities pain only with sitting for long periods of time.       Past Medical History:  Diagnosis Date  . Asthma    2010-only bothers pt during bad cold  . Depression    2010-had when mother passed away  . Gestational diabetes    2012  . History of gonorrhea   . Hypertension    2004  . Obesity   . Pregnancy induced hypertension    Past Surgical History:  Procedure Laterality Date  . CHOLECYSTECTOMY    . DILATION AND CURETTAGE OF UTERUS    . WISDOM TOOTH EXTRACTION     Family History  Problem Relation Age of Onset  . Hypertension Mother   . Diabetes Mother   . Cancer Mother   . Hypertension Father   . Diabetes Father   . Hyperlipidemia Father   . Arthritis    . Asthma    . Breast cancer    . Heart failure    . Congenital heart disease    . Depression    . Heart attack     Social History  Substance Use Topics  . Smoking status: Former Smoker    Types: Cigarettes    Quit date: 10/31/2010  . Smokeless tobacco: Never Used  . Alcohol use No   OB History    Gravida Para Term Preterm AB Living   11 7 7  0 4 6   SAB TAB Ectopic Multiple Live Births   4 0 0 0 7     Review of Systems  Constitutional: Negative.   Respiratory: Negative.   Cardiovascular: Negative.   Musculoskeletal: Positive for joint swelling.       Pain and swelling to rt knee when  going from sitting to standing.   Skin: Negative.     Allergies  Bee venom; Lisinopril; and Shellfish allergy  Home Medications   Prior to Admission medications   Medication Sig Start Date End Date Taking? Authorizing Provider  loratadine (CLARITIN) 10 MG tablet Take 1 tablet (10 mg total) by mouth daily. 12/05/15   Jaclyn ShaggyEnobong Amao, MD  losartan (COZAAR) 25 MG tablet Take 1 tablet (25 mg total) by mouth daily. 12/19/15   Quentin Angstlugbemiga E Jegede, MD  naproxen (NAPROSYN) 500 MG tablet Take 1 tablet (500 mg total) by mouth 2 (two) times daily. 01/21/16   Tobi BastosMelanie A Melis Trochez, NP  omeprazole (PRILOSEC) 20 MG capsule Take 1 capsule (20 mg total) by mouth daily. 12/05/15   Jaclyn ShaggyEnobong Amao, MD   Meds Ordered and Administered this Visit  Medications - No data to display  BP 138/87 (BP Location: Right Arm)   Pulse 78   Temp 98.6 F (37 C) (Oral)   Resp 18   LMP  (LMP Unknown)   SpO2 100%  No data found.   Physical Exam  Constitutional: She appears well-developed.  Cardiovascular: Normal rate  and regular rhythm.   Pulmonary/Chest: Effort normal and breath sounds normal.  Musculoskeletal: She exhibits tenderness.  RT knee pain with flexions, strong pedal pulses, no noted swelling, warm to palpation, no difference noted from lt knee, no deformity.     Urgent Care Course   Clinical Course    Procedures (including critical care time)  Labs Review Labs Reviewed  POCT PREGNANCY, URINE    Imaging Review No results found.        :         MDM   1. Chronic pain of right knee    You can use a ace wrap or a brace to help with pain.  Try elevating while at work instead of sitting dependent all day.  May rotate ice and heat.  You will need to see ortho to look at further options of treatment.     Tobi Bastos, NP 01/21/16 1434    Tobi Bastos, NP 01/21/16 [phone]

## 2016-01-21 NOTE — ED Triage Notes (Signed)
Pt  Has  Pain  And  Swelling  Of  The     r  Knee  Pt  Has   Been  Seen  For   Arthritis  In the  Past      She    denys  Any  specefic   Injury        She  Also reports  Some  Cramping  In the  l  Knee  As  Well       She  Is  Awake  And  Alert  And  Oriented  She  Has  Pain on  Weight  Bearing

## 2016-01-21 NOTE — Discharge Instructions (Signed)
You can use a ace wrap or a brace to help with pain.  Try elevating while at work instead of sitting dependent all day.  May rotate ice and heat.  Discussed pain and causing elevated blood pressure issues.

## 2016-01-23 ENCOUNTER — Inpatient Hospital Stay (HOSPITAL_COMMUNITY): Admission: RE | Admit: 2016-01-23 | Payer: Medicaid Other | Source: Ambulatory Visit

## 2016-01-25 ENCOUNTER — Telehealth (HOSPITAL_COMMUNITY): Payer: Self-pay

## 2016-01-25 NOTE — Telephone Encounter (Signed)
Encounter complete. 

## 2016-01-30 ENCOUNTER — Inpatient Hospital Stay (HOSPITAL_COMMUNITY): Admission: RE | Admit: 2016-01-30 | Payer: Medicaid Other | Source: Ambulatory Visit

## 2016-02-06 ENCOUNTER — Encounter: Payer: Self-pay | Admitting: Licensed Clinical Social Worker

## 2016-02-06 ENCOUNTER — Ambulatory Visit: Payer: Medicaid Other | Attending: Family Medicine | Admitting: Family Medicine

## 2016-02-06 ENCOUNTER — Encounter: Payer: Self-pay | Admitting: Family Medicine

## 2016-02-06 VITALS — BP 136/91 | HR 77 | Temp 98.1°F | Wt 250.2 lb

## 2016-02-06 DIAGNOSIS — M25461 Effusion, right knee: Secondary | ICD-10-CM | POA: Insufficient documentation

## 2016-02-06 DIAGNOSIS — Z9049 Acquired absence of other specified parts of digestive tract: Secondary | ICD-10-CM | POA: Insufficient documentation

## 2016-02-06 DIAGNOSIS — J45909 Unspecified asthma, uncomplicated: Secondary | ICD-10-CM | POA: Insufficient documentation

## 2016-02-06 DIAGNOSIS — Z9889 Other specified postprocedural states: Secondary | ICD-10-CM | POA: Diagnosis not present

## 2016-02-06 DIAGNOSIS — M25561 Pain in right knee: Secondary | ICD-10-CM | POA: Diagnosis not present

## 2016-02-06 DIAGNOSIS — Z79899 Other long term (current) drug therapy: Secondary | ICD-10-CM | POA: Diagnosis not present

## 2016-02-06 DIAGNOSIS — Z91013 Allergy to seafood: Secondary | ICD-10-CM | POA: Insufficient documentation

## 2016-02-06 DIAGNOSIS — I10 Essential (primary) hypertension: Secondary | ICD-10-CM | POA: Insufficient documentation

## 2016-02-06 DIAGNOSIS — Z888 Allergy status to other drugs, medicaments and biological substances status: Secondary | ICD-10-CM | POA: Insufficient documentation

## 2016-02-06 DIAGNOSIS — M25562 Pain in left knee: Secondary | ICD-10-CM | POA: Diagnosis not present

## 2016-02-06 DIAGNOSIS — E669 Obesity, unspecified: Secondary | ICD-10-CM | POA: Insufficient documentation

## 2016-02-06 DIAGNOSIS — N912 Amenorrhea, unspecified: Secondary | ICD-10-CM | POA: Diagnosis present

## 2016-02-06 LAB — POCT URINE PREGNANCY: PREG TEST UR: NEGATIVE

## 2016-02-06 MED ORDER — PREDNISONE 20 MG PO TABS: 20.0000 mg | ORAL_TABLET | Freq: Every day | ORAL | 0 refills | Status: DC

## 2016-02-06 MED ORDER — ACETAMINOPHEN-CODEINE #3 300-30 MG PO TABS: 1.0000 | ORAL_TABLET | Freq: Three times a day (TID) | ORAL | 0 refills | Status: DC | PRN

## 2016-02-06 NOTE — Patient Instructions (Signed)

## 2016-02-06 NOTE — Progress Notes (Signed)
LCSWA introduced self and [facility].   Pt disclosed that she would like community resources for rent assistance, in addition, to christmas assistance for her children.  Pt stated that she was short on time. LCSWA provided pt with contact information.   Plan of Action: LCSWA will follow up with pt to provide requested community services.

## 2016-02-06 NOTE — Progress Notes (Signed)
Subjective:  Patient ID: Crystal Burns, female    DOB: Jul 19, 1980  Age: 35 y.o. MRN: [id number]  CC: Joint Swelling (right)   HPI Crystal Burns is a 35 year old female with a history of hypertension who presents today complaining of right knee pain for the last one week which started suddenly with no history of trauma; she has noticed some swelling and pain is worse with weightbearing. She had similar symptoms a year ago and received cortisone shots in her right knee.  Also concerned that she has not had a period in the last month. She is currently on oral contraceptive pills but stopped the Depo shot  2 months ago.  Past Medical History:  Diagnosis Date  . Asthma    2010-only bothers pt during bad cold  . Depression    2010-had when mother passed away  . Gestational diabetes    2012  . History of gonorrhea   . Hypertension    2004  . Obesity   . Pregnancy induced hypertension     Past Surgical History:  Procedure Laterality Date  . CHOLECYSTECTOMY    . DILATION AND CURETTAGE OF UTERUS    . WISDOM TOOTH EXTRACTION      Allergies  Allergen Reactions  . Bee Venom Hives and Swelling  . Lisinopril Shortness Of Breath and Cough  . Shellfish Allergy Anaphylaxis     Outpatient Medications Prior to Visit  Medication Sig Dispense Refill  . loratadine (CLARITIN) 10 MG tablet Take 1 tablet (10 mg total) by mouth daily. 30 tablet 1  . losartan (COZAAR) 25 MG tablet Take 1 tablet (25 mg total) by mouth daily. 30 tablet 1  . naproxen (NAPROSYN) 500 MG tablet Take 1 tablet (500 mg total) by mouth 2 (two) times daily. (Patient not taking: Reported on 02/06/2016) 30 tablet 0  . omeprazole (PRILOSEC) 20 MG capsule Take 1 capsule (20 mg total) by mouth daily. (Patient not taking: Reported on 02/06/2016) 30 capsule 1   No facility-administered medications prior to visit.     ROS Review of Systems  Constitutional: Negative for activity change and appetite change.    HENT: Negative for sinus pressure and sore throat.   Respiratory: Negative for chest tightness, shortness of breath and wheezing.   Cardiovascular: Negative for chest pain and palpitations.  Gastrointestinal: Negative for abdominal distention, abdominal pain and constipation.  Genitourinary: Negative.   Musculoskeletal: Negative.        R knee pain  Psychiatric/Behavioral: Negative for behavioral problems and dysphoric mood.    Objective:  BP (!) 136/91 (BP Location: Right Arm, Patient Position: Sitting, Cuff Size: Large)   Pulse 77   Temp 98.1 F (36.7 C) (Oral)   Wt 250 lb 3.2 oz (113.5 kg)   LMP  (LMP Unknown)   SpO2 100%   BMI 44.32 kg/m   BP/Weight 02/06/2016 01/21/2016 01/16/2016  Systolic BP 136 138 134  Diastolic BP 91 87 74  Wt. (Lbs) 250.2 - 248  BMI 44.32 - 43.93      Physical Exam  Constitutional: She is oriented to person, place, and time. She appears well-developed and well-nourished.  Cardiovascular: Normal rate, normal heart sounds and intact distal pulses.   No murmur heard. Pulmonary/Chest: Effort normal and breath sounds normal. She has no wheezes. She has no rales. She exhibits no tenderness.  Abdominal: Soft. Bowel sounds are normal. She exhibits no distension and no mass. There is no tenderness.  Musculoskeletal: Normal range of motion. She exhibits  tenderness (tenderness on palpation of superolateral aspect of the right knee with slight edema).  Neurological: She is alert and oriented to person, place, and time.     Assessment & Plan:   1. Amenorrhea I have reassured her and educated her that given she was on the Depakote shot for a while she is likely to experience some amenorrhea Pregnancy test negative - POCT urine pregnancy  2. Acute pain of left knee Discussed weight loss and exercise - predniSONE (DELTASONE) 20 MG tablet; Take 1 tablet (20 mg total) by mouth daily with breakfast.  Dispense: 5 tablet; Refill: 0 - acetaminophen-codeine  (TYLENOL #3) 300-30 MG tablet; Take 1 tablet by mouth every 8 (eight) hours as needed for moderate pain.  Dispense: 30 tablet; Refill: 0   Meds ordered this encounter  Medications  . predniSONE (DELTASONE) 20 MG tablet    Sig: Take 1 tablet (20 mg total) by mouth daily with breakfast.    Dispense:  5 tablet    Refill:  0  . acetaminophen-codeine (TYLENOL #3) 300-30 MG tablet    Sig: Take 1 tablet by mouth every 8 (eight) hours as needed for moderate pain.    Dispense:  30 tablet    Refill:  0    Follow-up: Return in about 1 month (around 03/07/2016) for follow up on blood pressure.   Jaclyn ShaggyEnobong Amao MD

## 2016-02-06 NOTE — Progress Notes (Signed)
Pt has right knee swelling Pt hasn't had a menstrual in over a month

## 2016-02-14 ENCOUNTER — Telehealth: Payer: Self-pay | Admitting: Cardiovascular Disease

## 2016-02-14 NOTE — Telephone Encounter (Signed)
I cannot locate documentation for a call or recent results.

## 2016-02-14 NOTE — Telephone Encounter (Signed)
I have not tried to call the patient. Do not see where patient has been called in EPIC. No answer. Left message with this information and to call back if she has any questions.

## 2016-02-14 NOTE — Telephone Encounter (Signed)
New message     Returning a call to a nurse 

## 2016-02-23 ENCOUNTER — Ambulatory Visit (HOSPITAL_COMMUNITY)
Admission: RE | Admit: 2016-02-23 | Payer: Self-pay | Source: Ambulatory Visit | Attending: Cardiovascular Disease | Admitting: Cardiovascular Disease

## 2016-02-26 ENCOUNTER — Telehealth: Payer: Self-pay | Admitting: Family Medicine

## 2016-02-26 MED ORDER — LOSARTAN POTASSIUM 25 MG PO TABS: 25.0000 mg | ORAL_TABLET | Freq: Every day | ORAL | 1 refills | Status: DC

## 2016-02-26 MED FILL — LOSARTAN POTASSIUM 25 MG TA: 25 | 30 days supply | Qty: 30 | Fill #0

## 2016-02-26 NOTE — Telephone Encounter (Signed)
Patient is in need of losartan to be sent to pharmacy.  FYI: Patient is uninsured at this moment. Might be change in cost.

## 2016-02-26 NOTE — Telephone Encounter (Signed)
[facility]

## 2016-04-03 ENCOUNTER — Encounter (HOSPITAL_COMMUNITY): Payer: Self-pay | Admitting: Emergency Medicine

## 2016-04-03 ENCOUNTER — Ambulatory Visit (HOSPITAL_COMMUNITY)
Admission: EM | Admit: 2016-04-03 | Discharge: 2016-04-03 | Disposition: A | Discharge status: Home / Self Care | Payer: Medicaid Other | Attending: Family Medicine | Admitting: Family Medicine

## 2016-04-03 DIAGNOSIS — B9789 Other viral agents as the cause of diseases classified elsewhere: Secondary | ICD-10-CM

## 2016-04-03 DIAGNOSIS — J069 Acute upper respiratory infection, unspecified: Secondary | ICD-10-CM

## 2016-04-03 MED ORDER — METHYLPREDNISOLONE SODIUM SUCC 125 MG IJ SOLR
INTRAMUSCULAR | Status: AC
  Filled 2016-04-03: qty 2

## 2016-04-03 MED ORDER — HYDROCOD POLST-CPM POLST ER 10-8 MG/5ML PO SUER: 5.0000 mL | Freq: Two times a day (BID) | ORAL | 0 refills | Status: AC | PRN

## 2016-04-03 MED ORDER — METHYLPREDNISOLONE SODIUM SUCC 125 MG IJ SOLR
125.0000 mg | Freq: Once | INTRAMUSCULAR | Status: AC
  Administered 2016-04-03: 125 mg via INTRAMUSCULAR

## 2016-04-03 NOTE — Discharge Instructions (Signed)
Take the cough medication as prescribed. If the medications to expensive, you may take over-the-counter Delsym or Robitussin. I strongly believe that you have an viral illness. I do not believe that you will benefit from antibiotics. Please treat symptomatically. A lot of rest and a lot of hydration. Follow up with a primary care doctor in 1-2 weeks  if you do not improve.

## 2016-04-03 NOTE — ED Triage Notes (Signed)
Patient has a headache and general body aches.  Patient has a cough, sore throat, productive cough, hot flashes, chills, wheeze, and soreness in chest with cough.  Took a left over prednisone 20mg  tablet today.

## 2016-04-03 NOTE — ED Provider Notes (Signed)
CSN: [id number]     Arrival date & time 04/03/16  1707 History   First MD Initiated Contact with Patient 04/03/16 1806     Chief Complaint  Patient presents with  . Cough   (Consider location/radiation/quality/duration/timing/severity/associated sxs/prior Treatment) Patient is a well-appearing 35 y.o. Female, presents today for cold symptoms. Patient woke up on Christmas morning feeling sick with coughing and congestion. The next day, she started to have fever (Tmax at home is 100.3), hoarseness, chills, continuous cough and congestion, wheezing, and feeling out of breath. Patient states that her chest hurts from so much coughing. Patient took some leftover prednisone 20 mg today prior to arrival. She also have been using albuterol inhaler at home, which has helped. She admits to positive sick contact in the family.         Past Medical History:  Diagnosis Date  . Asthma    2010-only bothers pt during bad cold  . Depression    2010-had when mother passed away  . Gestational diabetes    2012  . History of gonorrhea   . Hypertension    2004  . Obesity   . Pregnancy induced hypertension    Past Surgical History:  Procedure Laterality Date  . CHOLECYSTECTOMY    . DILATION AND CURETTAGE OF UTERUS    . WISDOM TOOTH EXTRACTION     Family History  Problem Relation Age of Onset  . Hypertension Mother   . Diabetes Mother   . Cancer Mother   . Hypertension Father   . Diabetes Father   . Hyperlipidemia Father   . Arthritis    . Asthma    . Breast cancer    . Heart failure    . Congenital heart disease    . Depression    . Heart attack     Social History  Substance Use Topics  . Smoking status: Former Smoker    Types: Cigarettes    Quit date: 10/31/2010  . Smokeless tobacco: Never Used  . Alcohol use No   OB History    Gravida Para Term Preterm AB Living   11 7 7  0 4 6   SAB TAB Ectopic Multiple Live Births   4 0 0 0 7     Review of Systems  Constitutional:  Positive for appetite change, chills, fatigue and fever.  HENT: Positive for congestion, rhinorrhea, sneezing and sore throat. Negative for ear pain, sinus pain and sinus pressure.   Respiratory: Positive for cough, shortness of breath and wheezing.   Cardiovascular: Negative for palpitations.       +CP during cough  Gastrointestinal: Negative for abdominal pain, nausea and vomiting.  Musculoskeletal: Positive for myalgias.  Neurological: Positive for headaches. Negative for dizziness.    Allergies  Bee venom; Lisinopril; and Shellfish allergy  Home Medications   Prior to Admission medications   Medication Sig Start Date End Date Taking? Authorizing Provider  losartan (COZAAR) 25 MG tablet Take 1 tablet (25 mg total) by mouth daily. 02/26/16  Yes Jaclyn ShaggyEnobong Amao, MD  norethindrone (CAMILA) 0.35 MG tablet Take 1 tablet by mouth daily.   Yes Historical Provider, MD  acetaminophen-codeine (TYLENOL #3) 300-30 MG tablet Take 1 tablet by mouth every 8 (eight) hours as needed for moderate pain. Patient not taking: Reported on 04/03/2016 02/06/16   Jaclyn ShaggyEnobong Amao, MD  chlorpheniramine-HYDROcodone (TUSSIONEX PENNKINETIC ER) 10-8 MG/5ML SUER Take 5 mLs by mouth every 12 (twelve) hours as needed for cough. 04/03/16 04/10/16  Lucia EstelleFeng Hagar Sadiq, NP  loratadine (CLARITIN) 10 MG tablet Take 1 tablet (10 mg total) by mouth daily. 12/05/15   Jaclyn ShaggyEnobong Amao, MD  naproxen (NAPROSYN) 500 MG tablet Take 1 tablet (500 mg total) by mouth 2 (two) times daily. Patient not taking: Reported on 02/06/2016 01/21/16   Tobi BastosMelanie A Mitchell, NP  omeprazole (PRILOSEC) 20 MG capsule Take 1 capsule (20 mg total) by mouth daily. Patient not taking: Reported on 02/06/2016 12/05/15   Jaclyn ShaggyEnobong Amao, MD  predniSONE (DELTASONE) 20 MG tablet Take 1 tablet (20 mg total) by mouth daily with breakfast. Patient not taking: Reported on 04/03/2016 02/06/16   Jaclyn ShaggyEnobong Amao, MD   Meds Ordered and Administered this Visit   Medications  methylPREDNISolone  sodium succinate (SOLU-MEDROL) 125 mg/2 mL injection 125 mg (not administered)    BP 129/87 (BP Location: Left Arm)   Pulse 98   Temp 99.1 F (37.3 C) (Oral)   Resp 24   SpO2 97%  No data found.   Physical Exam  Constitutional: She is oriented to person, place, and time. She appears well-developed and well-nourished. No distress.  HENT:  Head: Normocephalic and atraumatic.  Right Ear: External ear normal.  Left Ear: External ear normal.  Nose: Nose normal.  Mouth/Throat: Oropharynx is clear and moist. No oropharyngeal exudate.  TM pearly gray bilaterally with no erythema  Eyes: EOM are normal. Pupils are equal, round, and reactive to light.  Neck: Normal range of motion. Neck supple.  Cardiovascular: Normal rate, regular rhythm and normal heart sounds.   No murmur heard. Pulmonary/Chest: Effort normal and breath sounds normal. No respiratory distress. She has no wheezes.  Abdominal: Soft. Bowel sounds are normal. She exhibits no distension. There is no tenderness.  Lymphadenopathy:    She has no cervical adenopathy.  Neurological: She is alert and oriented to person, place, and time.  Skin: Skin is warm and dry.  Psychiatric: She has a normal mood and affect.  Nursing note and vitals reviewed.   Urgent Care Course   Clinical Course     Procedures (including critical care time)  Labs Review Labs Reviewed - No data to display  Imaging Review No results found.  MDM   1. Viral URI with cough    Physical examination unremarkable. Symptoms are most consistent with URI. Patient insisted for an antibiotic. Patient educated that antibiotic would not be helpful since this is a viral illness. Treatment is supportive. Patient encouraged to rest, drink plenty of fluid, use Motrin/Tylenol as needed at appropriate dose for pain/fever.   Patient is requesting for a prescription for prednisone, however she reports that she is unable to afford the medication; therefore solu-medrol  125 mg given in clinic today. Prescription for Tussionex also given. Patient informed that she may get over-the-counter Delsym or Robitussin if the Tussionex prescription is too expensive. Patient informed to f/u with her PCP if she does not improve by next week.    Lucia EstelleFeng Oriya Kettering, NP 04/03/16 [phone]

## 2016-04-11 ENCOUNTER — Ambulatory Visit: Payer: Medicaid Other | Admitting: Family Medicine

## 2016-04-12 MED FILL — LOSARTAN POTASSIUM 25 MG TA: 25 | 30 days supply | Qty: 30 | Fill #1

## 2016-04-18 ENCOUNTER — Encounter (HOSPITAL_COMMUNITY): Payer: Self-pay

## 2016-04-18 DIAGNOSIS — I10 Essential (primary) hypertension: Secondary | ICD-10-CM | POA: Diagnosis not present

## 2016-04-18 DIAGNOSIS — R109 Unspecified abdominal pain: Secondary | ICD-10-CM | POA: Insufficient documentation

## 2016-04-18 DIAGNOSIS — R51 Headache: Secondary | ICD-10-CM | POA: Diagnosis not present

## 2016-04-18 DIAGNOSIS — J45909 Unspecified asthma, uncomplicated: Secondary | ICD-10-CM | POA: Insufficient documentation

## 2016-04-18 DIAGNOSIS — Z87891 Personal history of nicotine dependence: Secondary | ICD-10-CM | POA: Diagnosis not present

## 2016-04-18 DIAGNOSIS — R05 Cough: Secondary | ICD-10-CM | POA: Diagnosis present

## 2016-04-18 DIAGNOSIS — Z5321 Procedure and treatment not carried out due to patient leaving prior to being seen by health care provider: Secondary | ICD-10-CM | POA: Diagnosis not present

## 2016-04-18 LAB — LIPASE, BLOOD: Lipase: 29 U/L (ref 11–51)

## 2016-04-18 LAB — URINALYSIS, ROUTINE W REFLEX MICROSCOPIC
Bilirubin Urine: NEGATIVE
Glucose, UA: NEGATIVE mg/dL
Hgb urine dipstick: NEGATIVE
Ketones, ur: NEGATIVE mg/dL
Nitrite: NEGATIVE
Protein, ur: NEGATIVE mg/dL
SPECIFIC GRAVITY, URINE: 1.01 (ref 1.005–1.030)
pH: 6 (ref 5.0–8.0)

## 2016-04-18 LAB — COMPREHENSIVE METABOLIC PANEL
ALK PHOS: 75 U/L (ref 38–126)
ALT: 32 U/L (ref 14–54)
ANION GAP: 8 (ref 5–15)
AST: 26 U/L (ref 15–41)
Albumin: 3.8 g/dL (ref 3.5–5.0)
BUN: 9 mg/dL (ref 6–20)
CO2: 26 mmol/L (ref 22–32)
Calcium: 9.8 mg/dL (ref 8.9–10.3)
Chloride: 104 mmol/L (ref 101–111)
Creatinine, Ser: 0.63 mg/dL (ref 0.44–1.00)
GFR calc Af Amer: >60 mL/min (ref >60)
GFR calc non Af Amer: >60 mL/min (ref >60)
Glucose, Bld: 85 mg/dL (ref 65–99)
POTASSIUM: 4 mmol/L (ref 3.5–5.1)
SODIUM: 138 mmol/L (ref 135–145)
Total Bilirubin: 0.5 mg/dL (ref 0.3–1.2)
Total Protein: 7.4 g/dL (ref 6.5–8.1)

## 2016-04-18 LAB — CBC
HEMATOCRIT: 39.2 % (ref 36.0–46.0)
HEMOGLOBIN: 12.4 g/dL (ref 12.0–15.0)
MCH: 25.5 pg — AB (ref 26.0–34.0)
MCHC: 31.6 g/dL (ref 30.0–36.0)
MCV: 80.7 fL (ref 78.0–100.0)
Platelets: 320 10*3/uL (ref 150–400)
RBC: 4.86 MIL/uL (ref 3.87–5.11)
RDW: 13.7 % (ref 11.5–15.5)
WBC: 7.8 10*3/uL (ref 4.0–10.5)

## 2016-04-18 LAB — I-STAT BETA HCG BLOOD, ED (MC, WL, AP ONLY): I-stat hCG, quantitative: <5 m[IU]/mL (ref <5)

## 2016-04-18 NOTE — ED Triage Notes (Addendum)
Pt reports productive cough, laryngitis, nasal congestion, sneezing and right ear pain since Christmas day. She also reports headache and left flank/abdominal pain, nausea and had episode of vomiting and diarrhea 3 days ago, but none today.

## 2016-04-18 NOTE — ED Notes (Signed)
Pt up to desk.  Asking about wait time.  Pt expressing frustration. This RN apologized for wait time and attempted to provide explanation

## 2016-04-19 ENCOUNTER — Emergency Department (HOSPITAL_COMMUNITY)
Admission: EM | Admit: 2016-04-19 | Discharge: 2016-04-19 | Disposition: A | Discharge status: Home / Self Care | Payer: Medicaid Other | Attending: Emergency Medicine | Admitting: Emergency Medicine

## 2016-04-19 NOTE — ED Notes (Signed)
Pt up to desk.  Expressing frustrations with wait time.  This RN attempted to explain why patient has heard multiple names before being called back.  Pt states "this is ridiculous.  I have to be able to get my kids ready for school in the morning".  Pt left with family member.

## 2016-04-23 ENCOUNTER — Ambulatory Visit: Payer: Medicaid Other | Admitting: Family Medicine

## 2016-04-23 ENCOUNTER — Ambulatory Visit: Payer: Medicaid Other | Admitting: Internal Medicine

## 2016-04-25 ENCOUNTER — Ambulatory Visit: Payer: Medicaid Other

## 2016-05-06 ENCOUNTER — Ambulatory Visit: Payer: Medicaid Other | Attending: Family Medicine | Admitting: Family Medicine

## 2016-05-06 ENCOUNTER — Encounter: Payer: Self-pay | Admitting: Family Medicine

## 2016-05-06 ENCOUNTER — Encounter (INDEPENDENT_AMBULATORY_CARE_PROVIDER_SITE_OTHER): Payer: Self-pay

## 2016-05-06 VITALS — BP 128/85 | HR 66 | Temp 98.4°F | Ht 63.0 in | Wt 265.6 lb

## 2016-05-06 DIAGNOSIS — M25561 Pain in right knee: Secondary | ICD-10-CM

## 2016-05-06 DIAGNOSIS — R51 Headache: Secondary | ICD-10-CM | POA: Insufficient documentation

## 2016-05-06 DIAGNOSIS — Z79899 Other long term (current) drug therapy: Secondary | ICD-10-CM | POA: Diagnosis not present

## 2016-05-06 DIAGNOSIS — E669 Obesity, unspecified: Secondary | ICD-10-CM | POA: Diagnosis not present

## 2016-05-06 DIAGNOSIS — R109 Unspecified abdominal pain: Secondary | ICD-10-CM | POA: Insufficient documentation

## 2016-05-06 DIAGNOSIS — Z888 Allergy status to other drugs, medicaments and biological substances status: Secondary | ICD-10-CM | POA: Diagnosis not present

## 2016-05-06 DIAGNOSIS — M25512 Pain in left shoulder: Secondary | ICD-10-CM | POA: Diagnosis not present

## 2016-05-06 DIAGNOSIS — Z9889 Other specified postprocedural states: Secondary | ICD-10-CM | POA: Insufficient documentation

## 2016-05-06 DIAGNOSIS — Z9049 Acquired absence of other specified parts of digestive tract: Secondary | ICD-10-CM | POA: Insufficient documentation

## 2016-05-06 DIAGNOSIS — M940 Chondrocostal junction syndrome [Tietze]: Secondary | ICD-10-CM

## 2016-05-06 DIAGNOSIS — R0981 Nasal congestion: Secondary | ICD-10-CM | POA: Diagnosis present

## 2016-05-06 DIAGNOSIS — J4521 Mild intermittent asthma with (acute) exacerbation: Secondary | ICD-10-CM | POA: Diagnosis not present

## 2016-05-06 DIAGNOSIS — H538 Other visual disturbances: Secondary | ICD-10-CM | POA: Insufficient documentation

## 2016-05-06 DIAGNOSIS — I10 Essential (primary) hypertension: Secondary | ICD-10-CM | POA: Insufficient documentation

## 2016-05-06 DIAGNOSIS — F329 Major depressive disorder, single episode, unspecified: Secondary | ICD-10-CM | POA: Diagnosis not present

## 2016-05-06 DIAGNOSIS — Z91013 Allergy to seafood: Secondary | ICD-10-CM | POA: Diagnosis not present

## 2016-05-06 MED ORDER — AZITHROMYCIN 250 MG PO TABS: ORAL_TABLET | ORAL | 0 refills | Status: DC

## 2016-05-06 MED ORDER — FLUTICASONE PROPIONATE 50 MCG/ACT NA SUSP: 2.0000 | Freq: Every day | NASAL | 1 refills | Status: DC

## 2016-05-06 MED ORDER — PREDNISONE 20 MG PO TABS: 20.0000 mg | ORAL_TABLET | Freq: Two times a day (BID) | ORAL | 0 refills | Status: DC

## 2016-05-06 MED ORDER — LORATADINE 10 MG PO TABS: 10.0000 mg | ORAL_TABLET | Freq: Every day | ORAL | 1 refills | Status: DC

## 2016-05-06 MED ORDER — NAPROXEN 500 MG PO TABS: 500.0000 mg | ORAL_TABLET | Freq: Two times a day (BID) | ORAL | 1 refills | Status: DC

## 2016-05-06 MED FILL — predniSONE 20 MG TABS: 20 | 5 days supply | Qty: 10 | Fill #0

## 2016-05-06 MED FILL — NAPROXEN 500 MG TABLET: 500 | 15 days supply | Qty: 30 | Fill #0

## 2016-05-06 MED FILL — AZITHROMYCIN 250 MG TABLET: 250 | 5 days supply | Qty: 6 | Fill #0

## 2016-05-06 NOTE — Progress Notes (Signed)
Patient has numerous complaints- she seems very anxious. Hasn't stopped talking.

## 2016-05-06 NOTE — Progress Notes (Signed)
Subjective:  Patient ID: Crystal Burns, female    DOB: 08-13-1980  Age: 36 y.o. MRN: [id number]  CC: Chest Pain (left side); Shoulder Pain (left side); Abdominal Pain; Knee Pain (right side- swelling); congested; Cough (productive- green/yellow); Shortness of Breath; Otalgia; Headache (pain on right side); and Blurred Vision   HPI Crystal Burns is a 36 year old female with a history of hypertension who presents today complaining of a one-month history of cough productive of yellowish greenish sputum, shortness of breath, wheezing, nasal congestion. She was seen at urgent care and received antitussives along with prednisone and reports no improvement in symptoms. She also has left-sided chest pain which radiates to her shoulder and has been treated for costochondritis in the past; also referred to cardiology whom she saw and exercise stress test was ordered but the patient informs me her Medicaid ran out and she couldn't undergo this test. She also has intermittent knee pain and swelling.  Past Medical History:  Diagnosis Date  . Asthma    2010-only bothers pt during bad cold  . Depression    2010-had when mother passed away  . Gestational diabetes    2012  . History of gonorrhea   . Hypertension    2004  . Obesity   . Pregnancy induced hypertension     Past Surgical History:  Procedure Laterality Date  . CHOLECYSTECTOMY    . DILATION AND CURETTAGE OF UTERUS    . WISDOM TOOTH EXTRACTION      Allergies  Allergen Reactions  . Bee Venom Hives and Swelling  . Lisinopril Shortness Of Breath and Cough  . Shellfish Allergy Anaphylaxis     Outpatient Medications Prior to Visit  Medication Sig Dispense Refill  . losartan (COZAAR) 25 MG tablet Take 1 tablet (25 mg total) by mouth daily. 30 tablet 1  . norethindrone (CAMILA) 0.35 MG tablet Take 1 tablet by mouth daily.    Marland Kitchen. omeprazole (PRILOSEC) 20 MG capsule Take 1 capsule (20 mg total) by mouth daily. 30 capsule 1  .  loratadine (CLARITIN) 10 MG tablet Take 1 tablet (10 mg total) by mouth daily. 30 tablet 1  . naproxen (NAPROSYN) 500 MG tablet Take 1 tablet (500 mg total) by mouth 2 (two) times daily. 30 tablet 0  . acetaminophen-codeine (TYLENOL #3) 300-30 MG tablet Take 1 tablet by mouth every 8 (eight) hours as needed for moderate pain. (Patient not taking: Reported on 04/03/2016) 30 tablet 0  . predniSONE (DELTASONE) 20 MG tablet Take 1 tablet (20 mg total) by mouth daily with breakfast. (Patient not taking: Reported on 04/03/2016) 5 tablet 0   No facility-administered medications prior to visit.     ROS Review of Systems  Constitutional: Negative for activity change, appetite change and fatigue.  HENT: Positive for congestion and sinus pressure. Negative for sore throat.   Eyes: Negative for visual disturbance.  Respiratory: Positive for cough and wheezing. Negative for chest tightness and shortness of breath.   Cardiovascular: Negative for chest pain and palpitations.  Gastrointestinal: Negative for abdominal distention, abdominal pain and constipation.  Endocrine: Negative for polydipsia.  Genitourinary: Negative for dysuria and frequency.  Musculoskeletal:       See hpi   Skin: Negative for rash.  Neurological: Negative for tremors, light-headedness and numbness.  Hematological: Does not bruise/bleed easily.  Psychiatric/Behavioral: Negative for agitation and behavioral problems.    Objective:  BP 128/85 (BP Location: Right Arm, Patient Position: Sitting, Cuff Size: Large)   Pulse 66  Temp 98.4 F (36.9 C) (Oral)   Ht 5\' 3"  (1.6 m)   Wt 265 lb 9.6 oz (120.5 kg)   SpO2 100%   BMI 47.05 kg/m   BP/Weight 05/06/2016 04/18/2016 04/03/2016  Systolic BP 128 141 129  Diastolic BP 85 94 87  Wt. (Lbs) 265.6 - -  BMI 47.05 - -      Physical Exam  Constitutional: She is oriented to person, place, and time. She appears well-developed and well-nourished.  Cardiovascular: Normal rate,  normal heart sounds and intact distal pulses.   No murmur heard. Pulmonary/Chest: Effort normal. She has wheezes (slight bilateral expiratory wheezing). She has no rales. She exhibits tenderness (reproducible left-sided chest pain on palation).  Abdominal: Soft. Bowel sounds are normal. She exhibits no distension and no mass. There is no tenderness.  Musculoskeletal: Normal range of motion.  Neurological: She is alert and oriented to person, place, and time.  Skin: Skin is warm and dry.     Assessment & Plan:   1. Sinus congestion - loratadine (CLARITIN) 10 MG tablet; Take 1 tablet (10 mg total) by mouth daily.  Dispense: 30 tablet; Refill: 1 - fluticasone (FLONASE) 50 MCG/ACT nasal spray; Place 2 sprays into both nostrils daily.  Dispense: 16 g; Refill: 1 - azithromycin (ZITHROMAX) 250 MG tablet; 2 tablets (500 mg) on day 1 then 1 tab (250 mg) on days 2-5  Dispense: 6 tablet; Refill: 0  2. Costochondritis - naproxen (NAPROSYN) 500 MG tablet; Take 1 tablet (500 mg total) by mouth 2 (two) times daily.  Dispense: 30 tablet; Refill: 1  3. Mild intermittent asthma with acute exacerbation - predniSONE (DELTASONE) 20 MG tablet; Take 1 tablet (20 mg total) by mouth 2 (two) times daily with a meal.  Dispense: 10 tablet; Refill: 0  4. Knee pain - right Advised that weight loss will help symptoms Also use NSAIDs and apply ice.  Meds ordered this encounter  Medications  . loratadine (CLARITIN) 10 MG tablet    Sig: Take 1 tablet (10 mg total) by mouth daily.    Dispense:  30 tablet    Refill:  1  . naproxen (NAPROSYN) 500 MG tablet    Sig: Take 1 tablet (500 mg total) by mouth 2 (two) times daily.    Dispense:  30 tablet    Refill:  1  . predniSONE (DELTASONE) 20 MG tablet    Sig: Take 1 tablet (20 mg total) by mouth 2 (two) times daily with a meal.    Dispense:  10 tablet    Refill:  0  . fluticasone (FLONASE) 50 MCG/ACT nasal spray    Sig: Place 2 sprays into both nostrils daily.     Dispense:  16 g    Refill:  1  . azithromycin (ZITHROMAX) 250 MG tablet    Sig: 2 tablets (500 mg) on day 1 then 1 tab (250 mg) on days 2-5    Dispense:  6 tablet    Refill:  0    Follow-up: Return in about 3 months (around 08/04/2016) for follow up on chronic medical conditions.   Jaclyn Shaggy MD

## 2016-05-14 ENCOUNTER — Telehealth: Payer: Self-pay | Admitting: Family Medicine

## 2016-05-14 MED ORDER — LOSARTAN POTASSIUM 25 MG PO TABS: 25.0000 mg | ORAL_TABLET | Freq: Every day | ORAL | 2 refills | Status: DC

## 2016-05-14 NOTE — Telephone Encounter (Signed)
Losartan refilled 

## 2016-05-14 NOTE — Telephone Encounter (Signed)
Patient called the office to request refill on losartan (COZAAR) 25 MG tablet. Please send it to our pharmacy.   Thank you.

## 2016-05-16 MED FILL — LOSARTAN POTASSIUM 25 MG TA: 25 | 30 days supply | Qty: 30 | Fill #0

## 2016-05-27 ENCOUNTER — Emergency Department (HOSPITAL_COMMUNITY): Payer: Medicaid Other

## 2016-05-27 ENCOUNTER — Emergency Department (HOSPITAL_COMMUNITY)
Admission: EM | Admit: 2016-05-27 | Discharge: 2016-05-27 | Disposition: A | Discharge status: Home / Self Care | Payer: Medicaid Other | Attending: Emergency Medicine | Admitting: Emergency Medicine

## 2016-05-27 ENCOUNTER — Encounter (HOSPITAL_COMMUNITY): Payer: Self-pay

## 2016-05-27 DIAGNOSIS — R51 Headache: Secondary | ICD-10-CM | POA: Insufficient documentation

## 2016-05-27 DIAGNOSIS — H538 Other visual disturbances: Secondary | ICD-10-CM | POA: Diagnosis not present

## 2016-05-27 DIAGNOSIS — J45909 Unspecified asthma, uncomplicated: Secondary | ICD-10-CM | POA: Diagnosis not present

## 2016-05-27 DIAGNOSIS — Z79899 Other long term (current) drug therapy: Secondary | ICD-10-CM | POA: Insufficient documentation

## 2016-05-27 DIAGNOSIS — Z87891 Personal history of nicotine dependence: Secondary | ICD-10-CM | POA: Insufficient documentation

## 2016-05-27 DIAGNOSIS — R519 Headache, unspecified: Secondary | ICD-10-CM

## 2016-05-27 DIAGNOSIS — H539 Unspecified visual disturbance: Secondary | ICD-10-CM

## 2016-05-27 DIAGNOSIS — I10 Essential (primary) hypertension: Secondary | ICD-10-CM | POA: Insufficient documentation

## 2016-05-27 LAB — I-STAT CHEM 8, ED
BUN: 12 mg/dL (ref 6–20)
CHLORIDE: 103 mmol/L (ref 101–111)
CREATININE: 0.7 mg/dL (ref 0.44–1.00)
Calcium, Ion: 1.28 mmol/L (ref 1.15–1.40)
GLUCOSE: 102 mg/dL — AB (ref 65–99)
HEMATOCRIT: 34 % — AB (ref 36.0–46.0)
HEMOGLOBIN: 11.6 g/dL — AB (ref 12.0–15.0)
POTASSIUM: 3.8 mmol/L (ref 3.5–5.1)
Sodium: 142 mmol/L (ref 135–145)
TCO2: 30 mmol/L (ref 0–100)

## 2016-05-27 LAB — CBC WITH DIFFERENTIAL/PLATELET
BASOS ABS: 0 10*3/uL (ref 0.0–0.1)
Basophils Relative: 0 %
Eosinophils Absolute: 0.2 10*3/uL (ref 0.0–0.7)
Eosinophils Relative: 3 %
HEMATOCRIT: 36.4 % (ref 36.0–46.0)
Hemoglobin: 11.6 g/dL — ABNORMAL LOW (ref 12.0–15.0)
LYMPHS PCT: 31 %
Lymphs Abs: 2.3 10*3/uL (ref 0.7–4.0)
MCH: 25.8 pg — ABNORMAL LOW (ref 26.0–34.0)
MCHC: 31.9 g/dL (ref 30.0–36.0)
MCV: 81.1 fL (ref 78.0–100.0)
Monocytes Absolute: 0.3 10*3/uL (ref 0.1–1.0)
Monocytes Relative: 4 %
NEUTROS ABS: 4.4 10*3/uL (ref 1.7–7.7)
NEUTROS PCT: 62 %
Platelets: 264 10*3/uL (ref 150–400)
RBC: 4.49 MIL/uL (ref 3.87–5.11)
RDW: 13.9 % (ref 11.5–15.5)
WBC: 7.2 10*3/uL (ref 4.0–10.5)

## 2016-05-27 LAB — I-STAT BETA HCG BLOOD, ED (MC, WL, AP ONLY)

## 2016-05-27 MED ORDER — IBUPROFEN 600 MG PO TABS: 600.0000 mg | ORAL_TABLET | Freq: Four times a day (QID) | ORAL | 0 refills | Status: DC | PRN

## 2016-05-27 MED ORDER — IBUPROFEN 400 MG PO TABS
600.0000 mg | ORAL_TABLET | Freq: Once | ORAL | Status: AC
  Administered 2016-05-27: 21:00:00 600 mg via ORAL
  Filled 2016-05-27: qty 1

## 2016-05-27 MED ORDER — ACETAMINOPHEN 325 MG PO TABS: 650.0000 mg | ORAL_TABLET | Freq: Four times a day (QID) | ORAL | 0 refills | Status: DC | PRN

## 2016-05-27 MED ORDER — IOPAMIDOL (ISOVUE-300) INJECTION 61%
INTRAVENOUS | Status: AC
  Administered 2016-05-27: 75 mL
  Filled 2016-05-27: qty 75

## 2016-05-27 NOTE — ED Triage Notes (Signed)
Patient complains of BP running hight the past several days, also complains of right sided headache for the past few days, alert and oriented, NAD

## 2016-05-27 NOTE — Discharge Instructions (Signed)
We saw you in the ER for the headache and intermittent vision changes. All the labs and imaging are normal. We are not sure what is causing your headaches, however, there appears to be no evidence of infection, bleeds or tumors based on our exam and results.  Please take motrin round the clock for the next 6 hours, and take other meds prescribed only for break through pain. See your doctor, or the specialist if the pain persists, as you might need better medications or further workup.  Please return to the ER if the headache gets severe and in not improving, you have associated new one sided numbness, tingling, weakness or confusion, seizures, poor balance or poor vision.

## 2016-05-27 NOTE — ED Notes (Signed)
Pt noted eating [employer] fast food in hallway. In NAD.

## 2016-05-27 NOTE — ED Provider Notes (Signed)
[facility]-EMERGENCY DEPT Provider Note   CSN: [id number] Arrival date & time: 05/27/16  1304     History   Chief Complaint No chief complaint on file.   HPI Crystal Burns is a 36 y.o. female.  HPI Pt comes in with cc of headaches. Pt has hx of elevated BP. She reports that her current headache started 3 days ago, and it is new. The last 2 days the headache has been more constant (i.e. Present most of the time). The headache is R sided, and is described as sharp pain. Pt's pain is around the eye on the R side as well. There is no specific aggravating or relieving factors and no precipitating factors. Specifically, pt denies worsening of the pain with laying flat or with light/sound/noice. Pt has no nausea. Pt does indicate that her R eye has felt intermittently heavy and she has sometimes feels that her vision is blurry and there is some tearing. Pt denies trauma, uri like symptoms. Pt doesn't use contacts. Pt has no hx of clotting herself, but she reprots that her mother had DVTs and pt is on oral contraceptives. Pt is not currently pregnant, but she has had 6 kids.   Past Medical History:  Diagnosis Date  . Asthma    2010-only bothers pt during bad cold  . Depression    2010-had when mother passed away  . History of gonorrhea   . Hypertension    2004  . Obesity   . Pregnancy induced hypertension     Patient Active Problem List   Diagnosis Date Noted  . Atypical chest pain 01/16/2016  . Tooth ache 08/25/2015  . Vitamin D deficiency 07/10/2015  . Anxiety state 06/20/2015  . Light headedness 05/24/2015  . Costochondritis 05/18/2015  . Encounter for trial of labor 01/27/2015  . NSVD (normal spontaneous vaginal delivery) 01/27/2015  . Preeclampsia complicating hypertension 01/21/2015  . Evaluate anatomy not seen on prior sonogram   . [redacted] weeks gestation of pregnancy   . [redacted] weeks gestation of pregnancy   . Encounter for fetal anatomic survey   . Maternal morbid obesity,  antepartum (HCC)   . Placenta previa antepartum in second trimester   . Osteoarthritis of right knee 04/22/2014  . Essential hypertension 07/18/2012  . LUMBOSACRAL STRAIN, ACUTE 07/05/2008  . Morbid obesity (HCC) 02/24/2008  . Asthma with exacerbation 02/24/2008  . HYPERGLYCEMIA, BORDERLINE 02/24/2008    Past Surgical History:  Procedure Laterality Date  . CHOLECYSTECTOMY    . DILATION AND CURETTAGE OF UTERUS    . WISDOM TOOTH EXTRACTION      OB History    Gravida Para Term Preterm AB Living   11 7 7  0 4 6   SAB TAB Ectopic Multiple Live Births   4 0 0 0 7       Home Medications    Prior to Admission medications   Medication Sig Start Date End Date Taking? Authorizing Provider  acetaminophen (TYLENOL) 325 MG tablet Take 2 tablets (650 mg total) by mouth every 6 (six) hours as needed. 05/27/16   Derwood Kaplan, MD  acetaminophen-codeine (TYLENOL #3) 300-30 MG tablet Take 1 tablet by mouth every 8 (eight) hours as needed for moderate pain. Patient not taking: Reported on 04/03/2016 02/06/16   Jaclyn Shaggy, MD  azithromycin (ZITHROMAX) 250 MG tablet 2 tablets (500 mg) on day 1 then 1 tab (250 mg) on days 2-5 05/06/16   Jaclyn Shaggy, MD  fluticasone (FLONASE) 50 MCG/ACT nasal spray Place 2  sprays into both nostrils daily. 05/06/16   Jaclyn ShaggyEnobong Amao, MD  ibuprofen (ADVIL,MOTRIN) 600 MG tablet Take 1 tablet (600 mg total) by mouth every 6 (six) hours as needed. 05/27/16   Derwood KaplanAnkit Taquita Demby, MD  loratadine (CLARITIN) 10 MG tablet Take 1 tablet (10 mg total) by mouth daily. 05/06/16   Jaclyn ShaggyEnobong Amao, MD  losartan (COZAAR) 25 MG tablet Take 1 tablet (25 mg total) by mouth daily. 05/14/16   Jaclyn ShaggyEnobong Amao, MD  naproxen (NAPROSYN) 500 MG tablet Take 1 tablet (500 mg total) by mouth 2 (two) times daily. 05/06/16   Jaclyn ShaggyEnobong Amao, MD  norethindrone (CAMILA) 0.35 MG tablet Take 1 tablet by mouth daily.    Historical Provider, MD  omeprazole (PRILOSEC) 20 MG capsule Take 1 capsule (20 mg total) by mouth daily.  12/05/15   Jaclyn ShaggyEnobong Amao, MD  predniSONE (DELTASONE) 20 MG tablet Take 1 tablet (20 mg total) by mouth 2 (two) times daily with a meal. 05/06/16   Jaclyn ShaggyEnobong Amao, MD    Family History Family History  Problem Relation Age of Onset  . Hypertension Mother   . Diabetes Mother   . Cancer Mother   . Hypertension Father   . Diabetes Father   . Hyperlipidemia Father   . Arthritis    . Asthma    . Breast cancer    . Heart failure    . Congenital heart disease    . Depression    . Heart attack      Social History Social History  Substance Use Topics  . Smoking status: Former Smoker    Types: Cigarettes    Quit date: 10/31/2010  . Smokeless tobacco: Never Used  . Alcohol use No     Allergies   Bee venom; Lisinopril; and Shellfish allergy   Review of Systems Review of Systems  Constitutional: Positive for activity change.  Eyes: Positive for discharge and visual disturbance. Negative for photophobia, pain, redness and itching.  Gastrointestinal: Negative for nausea and vomiting.  Neurological: Positive for light-headedness and headaches. Negative for seizures, syncope, facial asymmetry, speech difficulty and weakness.    ROS 10 Systems reviewed and are negative for acute change except as noted in the HPI.     Physical Exam Updated Vital Signs BP 135/84 (BP Location: Right Arm)   Pulse 74   Temp 98.2 F (36.8 C) (Oral)   Resp 16   SpO2 99%   Physical Exam  Constitutional: She is oriented to person, place, and time. She appears well-developed and well-nourished.  HENT:  Head: Normocephalic and atraumatic.  Eyes: Conjunctivae and EOM are normal. Pupils are equal, round, and reactive to light. Right eye exhibits no discharge. Left eye exhibits no discharge. No scleral icterus.  Eomi, pupils are 3 mm and equal  Neck: Neck supple.  No meningismus  Cardiovascular: Normal rate, regular rhythm and normal heart sounds.   No murmur heard. Pulmonary/Chest: Effort normal. No  respiratory distress.  Abdominal: Soft. She exhibits no distension. There is no tenderness. There is no rebound and no guarding.  Neurological: She is alert and oriented to person, place, and time.  Cerebellar exam is normal (finger to nose) Sensory exam normal for bilateral upper and lower extremities - and patient is able to discriminate between sharp and dull. Motor exam is 4+/5   Skin: Skin is warm and dry.  Nursing note and vitals reviewed.    ED Treatments / Results  Labs (all labs ordered are listed, but only abnormal results are displayed) Labs  Reviewed  CBC WITH DIFFERENTIAL/PLATELET - Abnormal; Notable for the following:       Result Value   Hemoglobin 11.6 (*)    MCH 25.8 (*)    All other components within normal limits  I-STAT CHEM 8, ED - Abnormal; Notable for the following:    Glucose, Bld 102 (*)    Hemoglobin 11.6 (*)    HCT 34.0 (*)    All other components within normal limits  I-STAT BETA HCG BLOOD, ED ([facility], [facility], [facility] ONLY)    EKG  EKG Interpretation None       Radiology Ct Venogram Head  Result Date: 05/27/2016 CLINICAL DATA:  Severe right-sided headache.  Watery right eye. EXAM: CT VENOGRAM HEAD TECHNIQUE: Noncontrast imaging the brain was followed by delayed venous phase imaging of the brain. CONTRAST:  100 ISOVUE-300 IOPAMIDOL (ISOVUE-300) INJECTION 61% COMPARISON:  None. FINDINGS: No acute infarct, hemorrhage, or mass lesion is present. The ventricles are of normal size. No significant extraaxial fluid collection is present. No significant vascular calcifications are present. There is no hyperdense vessel. Calvarium is intact. The paranasal sinuses and mastoid air cells are clear. Venous phase imaging demonstrates normal opacification of the dural sinuses. The right transverse sinus is dominant. The cavernous sinus is patent bilaterally. Straight sinus and deep cerebral veins are intact. Cortical veins are unremarkable. The postcontrast images demonstrate no  pathologic enhancement. IMPRESSION: 1. Normal CT appearance of the brain. 2. The dural sinuses are patent without evidence for thrombus. Electronically Signed   By: Marin Roberts M.D.   On: 05/27/2016 19:56    Procedures Procedures (including critical care time)  Medications Ordered in ED Medications  ibuprofen (ADVIL,MOTRIN) tablet 600 mg (not administered)  iopamidol (ISOVUE-300) 61 % injection (75 mLs  Contrast Given 05/27/16 1923)     Initial Impression / Assessment and Plan / ED Course  I have reviewed the triage vital signs and the nursing notes.  Pertinent labs & imaging results that were available during my care of the patient were reviewed by me and considered in my medical decision making (see chart for details).  Clinical Course as of May 27 2101  Mon May 27, 2016  2059 Results from the ER workup discussed with the patient face to face and all questions answered to the best of my ability.   Upon reassessment, patient reports that the headache has improved. She continued to have no neurologic complains, but reports that the vision has been intermittently blurry. Strict return precautions discussed, pt will return to the ER if there is worsening visual complains, seizures, altered mental status, loss of consciousness, dizziness, new focal weakness, or numbness.    [AN]    Clinical Course User Index [AN] Derwood Kaplan, MD    Pt comes in with cc of R sided headaches which are new and she has associated intermittent visual disturbance. The headache is now more constant, the visual disturbance is intermittent. She has hx of HTN, no drug use. Pt has had multiple  pregnancies and is on oral OCPs and has family hx of DVTs.  Bedside neuro exam is normal. Visual acuity ordered, but at the bedside pt was able to count fingers properly and her peripheral vision was intact.  No clinical concerns for strokes, glaucoma, uveitis, eye infection, corneal abrasion,  Brain bleeds,  brain infections.  We will get CT venogram to ensure there is no venous or dural thrombosis and also effectively r/o mass.    Final Clinical Impressions(s) / ED Diagnoses  Final diagnoses:  Head ache  Severe headache  Vision changes    New Prescriptions New Prescriptions   ACETAMINOPHEN (TYLENOL) 325 MG TABLET    Take 2 tablets (650 mg total) by mouth every 6 (six) hours as needed.   IBUPROFEN (ADVIL,MOTRIN) 600 MG TABLET    Take 1 tablet (600 mg total) by mouth every 6 (six) hours as needed.     Derwood Kaplan, MD 05/27/16 2111

## 2016-05-27 NOTE — ED Notes (Signed)
Attempted to draw blood, without success.

## 2016-05-28 ENCOUNTER — Telehealth: Payer: Self-pay | Admitting: Family Medicine

## 2016-05-28 NOTE — Telephone Encounter (Signed)
Pt called with concerns of high BP for over 1 week, head aches for 4 days pulsating on the right side of her head and blurred vision.  She questions if it is related to her contraception since there was a change from Depo Injection to OCP 2 months ago. She takes her blood pressure at home and the readings have ranged from 152/106 to 138/102. Before she states her reading were normal at 120/80. States her diet consist of salads and water. Has had great results on this diet by loosing weight.  Last night she went to he ED and CT scan was normal.  Had question to add another blood pressure medication or adjust dose. Please advise

## 2016-05-28 NOTE — Telephone Encounter (Signed)
Patient called the office to speak with nurse regarding bp and other conditions that she is currently experiencing.  Please follow up

## 2016-05-29 NOTE — Telephone Encounter (Signed)
Apt scheduled for Monday at 11am. Pt aware. Instructed to proceed to ED for worsening headaches, chest pain, SOB, s/s of stroke. Pt verbalized understanding.

## 2016-05-29 NOTE — Telephone Encounter (Signed)
She will need an office visit to address that.

## 2016-05-30 ENCOUNTER — Ambulatory Visit: Payer: Medicaid Other | Attending: Family Medicine | Admitting: Family Medicine

## 2016-05-30 ENCOUNTER — Encounter: Payer: Self-pay | Admitting: Family Medicine

## 2016-05-30 VITALS — BP 136/85 | HR 70 | Temp 98.1°F | Ht 63.0 in | Wt 272.2 lb

## 2016-05-30 DIAGNOSIS — Z9049 Acquired absence of other specified parts of digestive tract: Secondary | ICD-10-CM | POA: Insufficient documentation

## 2016-05-30 DIAGNOSIS — I10 Essential (primary) hypertension: Secondary | ICD-10-CM | POA: Diagnosis not present

## 2016-05-30 DIAGNOSIS — Z79899 Other long term (current) drug therapy: Secondary | ICD-10-CM | POA: Insufficient documentation

## 2016-05-30 DIAGNOSIS — R51 Headache: Secondary | ICD-10-CM

## 2016-05-30 DIAGNOSIS — J45909 Unspecified asthma, uncomplicated: Secondary | ICD-10-CM | POA: Insufficient documentation

## 2016-05-30 DIAGNOSIS — F329 Major depressive disorder, single episode, unspecified: Secondary | ICD-10-CM | POA: Diagnosis not present

## 2016-05-30 DIAGNOSIS — J320 Chronic maxillary sinusitis: Secondary | ICD-10-CM | POA: Diagnosis present

## 2016-05-30 DIAGNOSIS — Z793 Long term (current) use of hormonal contraceptives: Secondary | ICD-10-CM | POA: Insufficient documentation

## 2016-05-30 DIAGNOSIS — M79604 Pain in right leg: Secondary | ICD-10-CM | POA: Diagnosis not present

## 2016-05-30 DIAGNOSIS — H538 Other visual disturbances: Secondary | ICD-10-CM | POA: Insufficient documentation

## 2016-05-30 DIAGNOSIS — Z9889 Other specified postprocedural states: Secondary | ICD-10-CM | POA: Diagnosis not present

## 2016-05-30 DIAGNOSIS — Z3041 Encounter for surveillance of contraceptive pills: Secondary | ICD-10-CM

## 2016-05-30 DIAGNOSIS — Z91013 Allergy to seafood: Secondary | ICD-10-CM | POA: Insufficient documentation

## 2016-05-30 DIAGNOSIS — M1711 Unilateral primary osteoarthritis, right knee: Secondary | ICD-10-CM | POA: Diagnosis not present

## 2016-05-30 DIAGNOSIS — J0100 Acute maxillary sinusitis, unspecified: Secondary | ICD-10-CM | POA: Insufficient documentation

## 2016-05-30 DIAGNOSIS — Z888 Allergy status to other drugs, medicaments and biological substances status: Secondary | ICD-10-CM | POA: Insufficient documentation

## 2016-05-30 DIAGNOSIS — R519 Headache, unspecified: Secondary | ICD-10-CM

## 2016-05-30 MED ORDER — AMOXICILLIN 500 MG PO CAPS: 500.0000 mg | ORAL_CAPSULE | Freq: Three times a day (TID) | ORAL | 0 refills | Status: DC

## 2016-05-30 MED ORDER — NAPROXEN 500 MG PO TABS: 500.0000 mg | ORAL_TABLET | Freq: Two times a day (BID) | ORAL | 1 refills | Status: DC

## 2016-05-30 NOTE — Patient Instructions (Signed)

## 2016-05-30 NOTE — Progress Notes (Signed)
Subjective:  Patient ID: Crystal Burns, female    DOB: 1980-10-06  Age: 36 y.o. MRN: [id number]  CC: Hypertension; Leg Pain (right); Blurred Vision (right); and Ear Fullness (bilateral)   HPI Crystal Burns is a 36 year old female with a history of hypertension, morbid obesity who presents today with several complaints. Over the last 2 weeks she has experienced a sensation of "head fullness which feels like her head blowing up like a balloon", ear fullness, blurry vision. She has also noticed tearing of her right eye. Denies diarrhea, sinus tenderness, postnasal drip or fever and she has not had a cough either. Used OTC sinus medications with mild relief in symptoms.  She complains of right knee pain which is rated at 8/10 and not responding to use of OTC analgesics. She has had cortisone injections in the past.  She complains of elevated blood pressure at home to about 140/100s and is concerned about this. Remains on losartan for hypertension; she has admitted to being stressed at work (works at Clinical biochemistcustomer service) and being stressed by her children one of whom has liver disease.  She is also wondering if we use of her birth control pills contribute to her symptoms as she has noted some weight gain and abdominal bloating.  Past Medical History:  Diagnosis Date  . Asthma    2010-only bothers pt during bad cold  . Depression    2010-had when mother passed away  . History of gonorrhea   . Hypertension    2004  . Obesity   . Pregnancy induced hypertension     Past Surgical History:  Procedure Laterality Date  . CHOLECYSTECTOMY    . DILATION AND CURETTAGE OF UTERUS    . WISDOM TOOTH EXTRACTION      Allergies  Allergen Reactions  . Bee Venom Hives and Swelling  . Lisinopril Shortness Of Breath and Cough  . Shellfish Allergy Anaphylaxis     Outpatient Medications Prior to Visit  Medication Sig Dispense Refill  . acetaminophen (TYLENOL) 325 MG tablet Take 2 tablets  (650 mg total) by mouth every 6 (six) hours as needed. 30 tablet 0  . fluticasone (FLONASE) 50 MCG/ACT nasal spray Place 2 sprays into both nostrils daily. 16 g 1  . ibuprofen (ADVIL,MOTRIN) 600 MG tablet Take 1 tablet (600 mg total) by mouth every 6 (six) hours as needed. 30 tablet 0  . loratadine (CLARITIN) 10 MG tablet Take 1 tablet (10 mg total) by mouth daily. 30 tablet 1  . losartan (COZAAR) 25 MG tablet Take 1 tablet (25 mg total) by mouth daily. 30 tablet 2  . omeprazole (PRILOSEC) 20 MG capsule Take 1 capsule (20 mg total) by mouth daily. 30 capsule 1  . naproxen (NAPROSYN) 500 MG tablet Take 1 tablet (500 mg total) by mouth 2 (two) times daily. 30 tablet 1  . acetaminophen-codeine (TYLENOL #3) 300-30 MG tablet Take 1 tablet by mouth every 8 (eight) hours as needed for moderate pain. (Patient not taking: Reported on 04/03/2016) 30 tablet 0  . norethindrone (CAMILA) 0.35 MG tablet Take 1 tablet by mouth daily.    Marland Kitchen. azithromycin (ZITHROMAX) 250 MG tablet 2 tablets (500 mg) on day 1 then 1 tab (250 mg) on days 2-5 6 tablet 0  . predniSONE (DELTASONE) 20 MG tablet Take 1 tablet (20 mg total) by mouth 2 (two) times daily with a meal. 10 tablet 0   No facility-administered medications prior to visit.     ROS Review of  Systems  Constitutional: Negative for activity change, appetite change and fatigue.  HENT: Positive for sinus pressure. Negative for congestion and sore throat.   Eyes: Positive for discharge and visual disturbance.  Respiratory: Negative for cough, chest tightness, shortness of breath and wheezing.   Cardiovascular: Negative for chest pain and palpitations.  Gastrointestinal: Negative for abdominal distention, abdominal pain and constipation.  Endocrine: Negative for polydipsia.  Genitourinary: Negative for dysuria and frequency.  Musculoskeletal:       See hpi  Skin: Negative for rash.  Neurological: Negative for tremors, light-headedness and numbness.  Hematological:  Does not bruise/bleed easily.  Psychiatric/Behavioral: Negative for agitation and behavioral problems.    Objective:  BP 136/85 (BP Location: Right Arm, Patient Position: Sitting, Cuff Size: Large)   Pulse 70   Temp 98.1 F (36.7 C) (Oral)   Ht 5\' 3"  (1.6 m)   Wt 272 lb 3.2 oz (123.5 kg)   SpO2 98%   BMI 48.22 kg/m   BP/Weight 05/30/2016 05/27/2016 05/06/2016  Systolic BP 136 140 128  Diastolic BP 85 95 85  Wt. (Lbs) 272.2 - 265.6  BMI 48.22 - 47.05      Physical Exam  Constitutional: She is oriented to person, place, and time. She appears well-developed and well-nourished.  Morbidly obese  HENT:  Right Ear: External ear normal.  Left Ear: External ear normal.  Mouth/Throat: Oropharynx is clear and moist.  No sinus tenderness bilaterally  Cardiovascular: Normal rate, normal heart sounds and intact distal pulses.   No murmur heard. Pulmonary/Chest: Effort normal and breath sounds normal. She has no wheezes. She has no rales. She exhibits no tenderness.  Abdominal: Soft. Bowel sounds are normal. She exhibits no distension and no mass. There is no tenderness.  Musculoskeletal: Normal range of motion. She exhibits tenderness (No tenderness to palpation of right knee but tenderness elicited on flexion extension.). She exhibits no edema (no edema of bilateral knees).  Neurological: She is alert and oriented to person, place, and time.     Assessment & Plan:   1. Acute non-recurrent maxillary sinusitis Symptoms seem to be improving but will treat due to prolonged duration - amoxicillin (AMOXIL) 500 MG capsule; Take 1 capsule (500 mg total) by mouth 3 (three) times daily.  Dispense: 30 capsule; Refill: 0  2. Sinus headache Hopefully treatment of sinusitis we will help alleviate symptoms Possible underlying tension headache due to stressors  3. Primary osteoarthritis of right knee Status post cortisone injections in the past We will work towards orthopedic referral; advised  to apply for the [facility] discount to facilitate this process Refuses tramadol - naproxen (NAPROSYN) 500 MG tablet; Take 1 tablet (500 mg total) by mouth 2 (two) times daily.  Dispense: 30 tablet; Refill: 1  4. Essential hypertension Blood pressure is normal in the clinic I have explained to her the difference between using a digital monitor at home and do manual blood pressure cuff. 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 Low-sodium diet, weight loss  5. Oral contraceptive use We have discussed available options including the fact that progestin only happens to be of lowest risk given her comorbidities. I have discussed with her the possibility of tubal ligation which she will need to see GYN for. She is not open to the IUD as she got pregnant on 2 occasions from this.     Meds  ordered this encounter  Medications  . naproxen (NAPROSYN) 500 MG tablet    Sig: Take 1 tablet (500 mg total) by mouth 2 (two) times daily.    Dispense:  30 tablet    Refill:  1  . amoxicillin (AMOXIL) 500 MG capsule    Sig: Take 1 capsule (500 mg total) by mouth 3 (three) times daily.    Dispense:  30 capsule    Refill:  0    Follow-up: Return in about 3 weeks (around 06/20/2016) for follow up of headaches and knee pain.   Jaclyn Shaggy MD

## 2016-05-31 DIAGNOSIS — Z3041 Encounter for surveillance of contraceptive pills: Secondary | ICD-10-CM | POA: Insufficient documentation

## 2016-06-03 ENCOUNTER — Ambulatory Visit: Payer: Medicaid Other | Admitting: Family Medicine

## 2016-06-11 MED FILL — AMOXICILLIN 500 MG CAPSULE: 500 | 10 days supply | Qty: 30 | Fill #0

## 2016-06-17 MED FILL — LOSARTAN POTASSIUM 25 MG TA: 25 | 30 days supply | Qty: 30 | Fill #1

## 2016-06-20 ENCOUNTER — Ambulatory Visit: Payer: Medicaid Other | Admitting: Family Medicine

## 2016-06-24 ENCOUNTER — Ambulatory Visit: Payer: Medicaid Other

## 2016-07-16 ENCOUNTER — Encounter: Payer: Self-pay | Admitting: Family Medicine

## 2016-07-16 ENCOUNTER — Ambulatory Visit: Payer: Self-pay | Attending: Family Medicine | Admitting: Family Medicine

## 2016-07-16 ENCOUNTER — Telehealth: Payer: Self-pay | Admitting: Family Medicine

## 2016-07-16 VITALS — BP 138/93 | HR 73 | Temp 97.7°F | Ht 63.0 in | Wt 281.0 lb

## 2016-07-16 DIAGNOSIS — F329 Major depressive disorder, single episode, unspecified: Secondary | ICD-10-CM | POA: Insufficient documentation

## 2016-07-16 DIAGNOSIS — R5383 Other fatigue: Secondary | ICD-10-CM | POA: Insufficient documentation

## 2016-07-16 DIAGNOSIS — R51 Headache: Secondary | ICD-10-CM | POA: Insufficient documentation

## 2016-07-16 DIAGNOSIS — Z9049 Acquired absence of other specified parts of digestive tract: Secondary | ICD-10-CM | POA: Insufficient documentation

## 2016-07-16 DIAGNOSIS — J45909 Unspecified asthma, uncomplicated: Secondary | ICD-10-CM | POA: Insufficient documentation

## 2016-07-16 DIAGNOSIS — E559 Vitamin D deficiency, unspecified: Secondary | ICD-10-CM | POA: Insufficient documentation

## 2016-07-16 DIAGNOSIS — Z79899 Other long term (current) drug therapy: Secondary | ICD-10-CM | POA: Insufficient documentation

## 2016-07-16 DIAGNOSIS — Z793 Long term (current) use of hormonal contraceptives: Secondary | ICD-10-CM | POA: Insufficient documentation

## 2016-07-16 DIAGNOSIS — Z9889 Other specified postprocedural states: Secondary | ICD-10-CM | POA: Insufficient documentation

## 2016-07-16 DIAGNOSIS — Z9119 Patient's noncompliance with other medical treatment and regimen: Secondary | ICD-10-CM | POA: Insufficient documentation

## 2016-07-16 DIAGNOSIS — Z3041 Encounter for surveillance of contraceptive pills: Secondary | ICD-10-CM

## 2016-07-16 DIAGNOSIS — I10 Essential (primary) hypertension: Secondary | ICD-10-CM | POA: Insufficient documentation

## 2016-07-16 DIAGNOSIS — R600 Localized edema: Secondary | ICD-10-CM | POA: Insufficient documentation

## 2016-07-16 DIAGNOSIS — R0981 Nasal congestion: Secondary | ICD-10-CM | POA: Insufficient documentation

## 2016-07-16 MED ORDER — LOSARTAN POTASSIUM 25 MG PO TABS: 25.0000 mg | ORAL_TABLET | Freq: Every day | ORAL | 3 refills | Status: DC

## 2016-07-16 MED ORDER — HYDROCHLOROTHIAZIDE 25 MG PO TABS: 25.0000 mg | ORAL_TABLET | Freq: Every day | ORAL | 3 refills | Status: DC

## 2016-07-16 MED ORDER — CETIRIZINE HCL 10 MG PO TABS: 10.0000 mg | ORAL_TABLET | Freq: Every day | ORAL | 3 refills | Status: DC

## 2016-07-16 MED ORDER — FLUTICASONE PROPIONATE 50 MCG/ACT NA SUSP: 2.0000 | Freq: Every day | NASAL | 3 refills | Status: DC

## 2016-07-16 MED FILL — FLUTICASONE PROP 50 MCG SPR: 50 | 30 days supply | Qty: 16 | Fill #0

## 2016-07-16 MED FILL — LOSARTAN POTASSIUM 25 MG TA: 25 | 30 days supply | Qty: 30 | Fill #0

## 2016-07-16 MED FILL — HYDROCHLOROTHIAZIDE 25 MG T: 25 | 30 days supply | Qty: 30 | Fill #0

## 2016-07-16 MED FILL — ?CETIRIZINE HCL 10 MG TABLE: 10 | 30 days supply | Qty: 30 | Fill #0

## 2016-07-16 NOTE — Telephone Encounter (Signed)
Could you please find out the indication for FMLA paperwork? Thank you

## 2016-07-16 NOTE — Progress Notes (Signed)
Subjective:  Patient ID: Crystal Burns, female    DOB: 1981-03-19  Age: 36 y.o. MRN: [id number]  CC: Hypertension; ear popping (bilateral); Edema (left  leg and ankle); Headache; and break thru bleeding (2 weeks- 13 days)   HPI NASHA DISS is a 36 year old female with a history of hypertension, morbid obesity who presents today for a follow up visit.  She has experienced a sensation of "head fullness which feels like her head blowing up like a balloon", ear fullness and popping, lightheadedness. Denies sinus tenderness, postnasal drip or fever and she has not had a cough either. She has not been compliant with the Flonase and loratadine which she was prescribed and takes this intermittently.  She complains of elevated blood pressure at home (blood pressure log reveals values of 124/93 - 150/90) and she has been compliant with losartan .She has admitted to being stressed at work (works at Therapist, art) and being stressed by her children one of whom has liver disease ( she has 6 children) Complains of fatigue, she does not get to rest well and admits to the fact that she does not do anything for herself as she is "overwhelmed"  She complains of bloating and is also wondering if use of her birth control pills contribute to her symptoms as she has noted some weight gain as well. Tried Depo shot in the past which did not work for her. Also had an IUD placed but still got pregnant with an IUD in place as per patient.  Past Medical History:  Diagnosis Date  . Asthma    2010-only bothers pt during bad cold  . Depression    2010-had when mother passed away  . History of gonorrhea   . Hypertension    2004  . Obesity   . Pregnancy induced hypertension     Past Surgical History:  Procedure Laterality Date  . CHOLECYSTECTOMY    . DILATION AND CURETTAGE OF UTERUS    . WISDOM TOOTH EXTRACTION       Outpatient Medications Prior to Visit  Medication Sig Dispense Refill  .  acetaminophen (TYLENOL) 325 MG tablet Take 2 tablets (650 mg total) by mouth every 6 (six) hours as needed. 30 tablet 0  . ibuprofen (ADVIL,MOTRIN) 600 MG tablet Take 1 tablet (600 mg total) by mouth every 6 (six) hours as needed. 30 tablet 0  . norethindrone (CAMILA) 0.35 MG tablet Take 1 tablet by mouth daily.    Marland Kitchen loratadine (CLARITIN) 10 MG tablet Take 1 tablet (10 mg total) by mouth daily. 30 tablet 1  . losartan (COZAAR) 25 MG tablet Take 1 tablet (25 mg total) by mouth daily. 30 tablet 2  . acetaminophen-codeine (TYLENOL #3) 300-30 MG tablet Take 1 tablet by mouth every 8 (eight) hours as needed for moderate pain. (Patient not taking: Reported on 04/03/2016) 30 tablet 0  . naproxen (NAPROSYN) 500 MG tablet Take 1 tablet (500 mg total) by mouth 2 (two) times daily. (Patient not taking: Reported on 07/16/2016) 30 tablet 1  . omeprazole (PRILOSEC) 20 MG capsule Take 1 capsule (20 mg total) by mouth daily. (Patient not taking: Reported on 07/16/2016) 30 capsule 1  . amoxicillin (AMOXIL) 500 MG capsule Take 1 capsule (500 mg total) by mouth 3 (three) times daily. 30 capsule 0  . fluticasone (FLONASE) 50 MCG/ACT nasal spray Place 2 sprays into both nostrils daily. (Patient not taking: Reported on 07/16/2016) 16 g 1   No facility-administered medications prior to visit.  ROS Review of Systems  Constitutional: Positive for fatigue. Negative for activity change and appetite change.  HENT: Negative for congestion, sinus pressure and sore throat.   Eyes: Negative for visual disturbance.  Respiratory: Negative for cough, chest tightness, shortness of breath and wheezing.   Cardiovascular: Negative for chest pain and palpitations.  Gastrointestinal: Negative for abdominal distention, abdominal pain and constipation.  Endocrine: Negative for polydipsia.  Genitourinary: Negative for dysuria and frequency.  Musculoskeletal: Negative for arthralgias and back pain.  Skin: Negative for rash.    Neurological: Negative for tremors, light-headedness and numbness.  Hematological: Does not bruise/bleed easily.  Psychiatric/Behavioral: Negative for agitation and behavioral problems.    Objective:  BP (!) 138/93 (BP Location: Left Arm, Patient Position: Sitting, Cuff Size: Large)   Pulse 73   Temp 97.7 F (36.5 C) (Oral)   Ht _0  (1.6 m)   Wt 281 lb (127.5 kg)   SpO2 100%   BMI 49.78 kg/m   BP/Weight 07/16/2016 05/30/2016 12/22/3844  Systolic BP 659 935 701  Diastolic BP 93 85 95  Wt. (Lbs) 281 272.2 -  BMI 49.78 48.22 -      Physical Exam Constitutional: She is oriented to person, place, and time. She appears well-developed and well-nourished.  Morbidly obese  HENT:  Right Ear: External ear normal.  Left Ear: External ear normal.  Mouth/Throat: Oropharynx is clear and moist.  No sinus tenderness bilaterally  Cardiovascular: Normal rate, normal heart sounds and intact distal pulses.   No murmur heard. Pulmonary/Chest: Effort normal and breath sounds normal. She has no wheezes. She has no rales. She exhibits no tenderness.  Abdominal: Soft. Bowel sounds are normal. She exhibits no distension and no mass. There is no tenderness.  Musculoskeletal: Normal range of motion. She exhibits tenderness (No tenderness to palpation of right knee but tenderness elicited on flexion extension.). She exhibits no edema (no edema of bilateral knees).  Neurological: She is alert and oriented to person, place, and time.    Assessment & Plan:   1. Vitamin D deficiency History of vitamin D deficiency in the past - Vitamin D, 25-hydroxy; Future  2. Other fatigue Could be secondary to lack of adequate rest We'll screen for thyroid disorder - TSH; Future  3. Sinus congestion Emphasize compliance with Flonase and antihistamine - fluticasone (FLONASE) 50 MCG/ACT nasal spray; Place 2 sprays into both nostrils daily.  Dispense: 16 g; Refill: 3 - cetirizine (ZYRTEC) 10 MG tablet; Take 1  tablet (10 mg total) by mouth daily.  Dispense: 30 tablet; Refill: 3  4. Morbid obesity ([place]) Discussed exercise, reducing portion sizes  5. Essential hypertension Slightly above goal of less than 130/80 Hydrochlorothiazide added to regimen, continue losartan. - hydrochlorothiazide (HYDRODIURIL) 25 MG tablet; Take 1 tablet (25 mg total) by mouth daily.  Dispense: 90 tablet; Refill: 3 - CMP14+EGFR; Future - Lipid panel; Future - losartan (COZAAR) 25 MG tablet; Take 1 tablet (25 mg total) by mouth daily.  Dispense: 30 tablet; Refill: 3  6. Oral contraceptive use Continue current oral contraceptive meanwhile will refer to GYN to discuss other contraceptive options. - Ambulatory referral to Obstetrics / Gynecology   Meds ordered this encounter  Medications  . hydrochlorothiazide (HYDRODIURIL) 25 MG tablet    Sig: Take 1 tablet (25 mg total) by mouth daily.    Dispense:  90 tablet    Refill:  3  . losartan (COZAAR) 25 MG tablet    Sig: Take 1 tablet (25 mg total) by mouth  daily.    Dispense:  30 tablet    Refill:  3  . fluticasone (FLONASE) 50 MCG/ACT nasal spray    Sig: Place 2 sprays into both nostrils daily.    Dispense:  16 g    Refill:  3  . cetirizine (ZYRTEC) 10 MG tablet    Sig: Take 1 tablet (10 mg total) by mouth daily.    Dispense:  30 tablet    Refill:  3    Follow-up: Return in about 1 month (around 08/15/2016) for follow up on Hypertension.   Arnoldo Morale MD

## 2016-07-16 NOTE — Telephone Encounter (Signed)
PT came to the facility to drop-off FMLA paperwork. Paperwork will be put in at the provider box.PT was told that the nurse will call her when the paperwork was completed, please follow up

## 2016-07-18 NOTE — Telephone Encounter (Deleted)
Noted  

## 2016-07-18 NOTE — Telephone Encounter (Signed)
Noted  

## 2016-07-19 ENCOUNTER — Telehealth: Payer: Self-pay | Admitting: Family Medicine

## 2016-07-19 NOTE — Telephone Encounter (Signed)
Patient called the office asking to speak with nurse regarding the side effects that the bp medication that were given to her is making her heart race. Pt is very concerned. Please follow up.  Thank you.

## 2016-07-23 ENCOUNTER — Ambulatory Visit: Payer: Self-pay | Attending: Family Medicine | Admitting: Family Medicine

## 2016-07-23 ENCOUNTER — Encounter: Payer: Self-pay | Admitting: Family Medicine

## 2016-07-23 VITALS — BP 120/80 | HR 88 | Temp 97.7°F | Resp 18 | Ht 63.0 in | Wt 274.0 lb

## 2016-07-23 DIAGNOSIS — J45909 Unspecified asthma, uncomplicated: Secondary | ICD-10-CM | POA: Insufficient documentation

## 2016-07-23 DIAGNOSIS — E559 Vitamin D deficiency, unspecified: Secondary | ICD-10-CM

## 2016-07-23 DIAGNOSIS — Z888 Allergy status to other drugs, medicaments and biological substances status: Secondary | ICD-10-CM | POA: Insufficient documentation

## 2016-07-23 DIAGNOSIS — J329 Chronic sinusitis, unspecified: Secondary | ICD-10-CM | POA: Insufficient documentation

## 2016-07-23 DIAGNOSIS — Z9889 Other specified postprocedural states: Secondary | ICD-10-CM | POA: Insufficient documentation

## 2016-07-23 DIAGNOSIS — F329 Major depressive disorder, single episode, unspecified: Secondary | ICD-10-CM | POA: Insufficient documentation

## 2016-07-23 DIAGNOSIS — R5383 Other fatigue: Secondary | ICD-10-CM

## 2016-07-23 DIAGNOSIS — M940 Chondrocostal junction syndrome [Tietze]: Secondary | ICD-10-CM | POA: Insufficient documentation

## 2016-07-23 DIAGNOSIS — I10 Essential (primary) hypertension: Secondary | ICD-10-CM | POA: Insufficient documentation

## 2016-07-23 DIAGNOSIS — Z91013 Allergy to seafood: Secondary | ICD-10-CM | POA: Insufficient documentation

## 2016-07-23 DIAGNOSIS — Z79899 Other long term (current) drug therapy: Secondary | ICD-10-CM | POA: Insufficient documentation

## 2016-07-23 DIAGNOSIS — J328 Other chronic sinusitis: Secondary | ICD-10-CM

## 2016-07-23 DIAGNOSIS — Z9049 Acquired absence of other specified parts of digestive tract: Secondary | ICD-10-CM | POA: Insufficient documentation

## 2016-07-23 MED ORDER — GUAIFENESIN ER 600 MG PO TB12: 600.0000 mg | ORAL_TABLET | Freq: Two times a day (BID) | ORAL | 0 refills | Status: DC

## 2016-07-23 NOTE — Progress Notes (Signed)
Subjective:  Patient ID: Crystal Burns, female    DOB: [birth year]  Age: 36 y.o. MRN: [id number]  CC: URI   HPI Crystal Burns  is a 36 year old female with a history of hypertension, morbid obesity who presents today Complaining of a 2 day history of cough but is unable to produce any mucus; she feels a thick mucous in her throat, scratchy throat, associated nasal congestion.  She was seen one week ago for chronic sinusitis and had been advised to comply with her antihistamine and Flonase. She informs me today that she thought she had palpitations with taking her Zyrtec and hydrochlorothiazide which led to her discontinuing both medications; she has not been taking her Flonase either. Her heart rate today is perfectly normal. She denies fever; has intermittent reproducible chest pains which has been diagnosed as costochondritis for which she was placed on NSAIDs.   Past Medical History:  Diagnosis Date  . Asthma    2010-only bothers pt during bad cold  . Depression    2010-had when mother passed away  . History of gonorrhea   . Hypertension    2004  . Obesity   . Pregnancy induced hypertension     Past Surgical History:  Procedure Laterality Date  . CHOLECYSTECTOMY    . DILATION AND CURETTAGE OF UTERUS    . WISDOM TOOTH EXTRACTION      Allergies  Allergen Reactions  . Bee Venom Hives and Swelling  . Lisinopril Shortness Of Breath and Cough  . Shellfish Allergy Anaphylaxis     Outpatient Medications Prior to Visit  Medication Sig Dispense Refill  . acetaminophen (TYLENOL) 325 MG tablet Take 2 tablets (650 mg total) by mouth every 6 (six) hours as needed. 30 tablet 0  . cetirizine (ZYRTEC) 10 MG tablet Take 1 tablet (10 mg total) by mouth daily. 30 tablet 3  . fluticasone (FLONASE) 50 MCG/ACT nasal spray Place 2 sprays into both nostrils daily. 16 g 3  . ibuprofen (ADVIL,MOTRIN) 600 MG tablet Take 1 tablet (600 mg total) by mouth every 6 (six) hours as needed.  30 tablet 0  . losartan (COZAAR) 25 MG tablet Take 1 tablet (25 mg total) by mouth daily. 30 tablet 3  . norethindrone (CAMILA) 0.35 MG tablet Take 1 tablet by mouth daily.    Marland Kitchen acetaminophen-codeine (TYLENOL #3) 300-30 MG tablet Take 1 tablet by mouth every 8 (eight) hours as needed for moderate pain. (Patient not taking: Reported on 04/03/2016) 30 tablet 0  . hydrochlorothiazide (HYDRODIURIL) 25 MG tablet Take 1 tablet (25 mg total) by mouth daily. (Patient not taking: Reported on 07/23/2016) 90 tablet 3  . naproxen (NAPROSYN) 500 MG tablet Take 1 tablet (500 mg total) by mouth 2 (two) times daily. (Patient not taking: Reported on 07/16/2016) 30 tablet 1  . omeprazole (PRILOSEC) 20 MG capsule Take 1 capsule (20 mg total) by mouth daily. (Patient not taking: Reported on 07/23/2016) 30 capsule 1   No facility-administered medications prior to visit.     ROS Review of Systems  Constitutional: Negative for activity change and appetite change.  HENT: Positive for congestion and postnasal drip. Negative for sinus pressure and sore throat.   Respiratory: Positive for cough. Negative for chest tightness, shortness of breath and wheezing.   Cardiovascular: Negative for chest pain and palpitations.  Gastrointestinal: Negative for abdominal distention, abdominal pain and constipation.  Genitourinary: Negative.   Musculoskeletal: Negative.   Psychiatric/Behavioral: Negative for behavioral problems and dysphoric mood.  Objective:  BP 120/80 (BP Location: Right Arm, Patient Position: Sitting, Cuff Size: Large)   Pulse 88   Temp 97.7 F (36.5 C) (Oral)   Resp 18   Ht  (1.6 m)   Wt 274 lb (124.3 kg)   SpO2 100%   BMI 48.54 kg/m   BP/Weight 07/23/2016 07/16/2016 05/30/2016  Systolic BP 120 138 136  Diastolic BP 80 93 85  Wt. (Lbs) 274 281 272.2  BMI 48.54 49.78 48.22      Physical Exam  Constitutional: She is oriented to person, place, and time. She appears well-developed and  well-nourished.  HENT:  Right Ear: External ear normal.  Left Ear: External ear normal.  Nose: Nose normal.  Mouth/Throat: Oropharynx is clear and moist.  Cardiovascular: Normal rate, normal heart sounds and intact distal pulses.   No murmur heard. Pulmonary/Chest: Effort normal and breath sounds normal. She has no wheezes. She has no rales. She exhibits no tenderness.  Abdominal: Soft. Bowel sounds are normal. She exhibits no distension and no mass. There is no tenderness.  Musculoskeletal: Normal range of motion.  Neurological: She is alert and oriented to person, place, and time.     Assessment & Plan:   1. Essential hypertension Controlled Advised to continue with Cozaar and hydrochlorothiazide Low-sodium diet Weight loss  2. Other chronic sinusitis Emphasized the need for patient to be compliant with Flonase, Zyrtec Will add Mucinex to regimen.   Meds ordered this encounter  Medications  . guaiFENesin (MUCINEX) 600 MG 12 hr tablet    Sig: Take 1 tablet (600 mg total) by mouth 2 (two) times daily.    Dispense:  20 tablet    Refill:  0    Follow-up: No Follow-up on file.   Jaclyn Shaggy MD

## 2016-07-23 NOTE — Progress Notes (Signed)
Patient is here for URI concerns  Patient complains of a productive cough being present for the past 2 days. Patient states her throat is itchy which causes her to cough.  Patient has taken medication today. Patient has eaten today.

## 2016-07-24 ENCOUNTER — Other Ambulatory Visit: Payer: Self-pay | Admitting: Family Medicine

## 2016-07-24 LAB — CMP14+EGFR
ALBUMIN: 3.7 g/dL (ref 3.5–5.5)
ALT: 70 IU/L — ABNORMAL HIGH (ref 0–32)
AST: 36 IU/L (ref 0–40)
Albumin/Globulin Ratio: 1.5 (ref 1.2–2.2)
Alkaline Phosphatase: 72 IU/L (ref 39–117)
BILIRUBIN TOTAL: 0.3 mg/dL (ref 0.0–1.2)
BUN / CREAT RATIO: 22 (ref 9–23)
BUN: 12 mg/dL (ref 6–20)
CALCIUM: 9.4 mg/dL (ref 8.7–10.2)
CO2: 26 mmol/L (ref 18–29)
CREATININE: 0.55 mg/dL — AB (ref 0.57–1.00)
Chloride: 104 mmol/L (ref 96–106)
GFR, EST AFRICAN AMERICAN: 141 mL/min/{1.73_m2} (ref >59)
GFR, EST NON AFRICAN AMERICAN: 122 mL/min/{1.73_m2} (ref >59)
GLUCOSE: 81 mg/dL (ref 65–99)
Globulin, Total: 2.4 g/dL (ref 1.5–4.5)
Potassium: 4 mmol/L (ref 3.5–5.2)
Sodium: 142 mmol/L (ref 134–144)
TOTAL PROTEIN: 6.1 g/dL (ref 6.0–8.5)

## 2016-07-24 LAB — VITAMIN D 25 HYDROXY (VIT D DEFICIENCY, FRACTURES): VIT D 25 HYDROXY: 13.1 ng/mL — AB (ref 30.0–100.0)

## 2016-07-24 LAB — LIPID PANEL
CHOL/HDL RATIO: 5.1 ratio — AB (ref 0.0–4.4)
Cholesterol, Total: 143 mg/dL (ref 100–199)
HDL: 28 mg/dL — AB (ref >39)
LDL CALC: 92 mg/dL (ref 0–99)
Triglycerides: 113 mg/dL (ref 0–149)
VLDL CHOLESTEROL CAL: 23 mg/dL (ref 5–40)

## 2016-07-24 LAB — TSH: TSH: 1.19 u[IU]/mL (ref 0.450–4.500)

## 2016-07-24 MED ORDER — ERGOCALCIFEROL 1.25 MG (50000 UT) PO CAPS: 50000.0000 [IU] | ORAL_CAPSULE | ORAL | 0 refills | Status: DC

## 2016-07-24 NOTE — Telephone Encounter (Signed)
Late entry. Pt has appointment 07/23/16 with Dr. Venetia Night.

## 2016-07-25 ENCOUNTER — Other Ambulatory Visit: Payer: Self-pay

## 2016-07-25 MED ORDER — ERGOCALCIFEROL 1.25 MG (50000 UT) PO CAPS: 50000.0000 [IU] | ORAL_CAPSULE | ORAL | 0 refills | Status: DC

## 2016-07-25 MED FILL — VIT D2 1.25 MG (50,000 UNIT: 1.25 MG | 63 days supply | Qty: 9 | Fill #0

## 2016-07-25 NOTE — Telephone Encounter (Signed)
-----   Message from Jaclyn Shaggy, MD sent at 07/24/2016  4:48 PM EDT ----- Cholesterol and thyroid labs are normal, vitamin D is very low which could explain her fatigue. I have sent a prescription for Drisdol to her pharmacy.

## 2016-07-25 NOTE — Telephone Encounter (Signed)
Writer called patient and discussed lab results.  Patient stated understanding and would like the Vit.d sent to the [facility].  Writer sent it over.

## 2016-07-30 ENCOUNTER — Encounter: Payer: Self-pay | Admitting: Obstetrics & Gynecology

## 2016-08-15 ENCOUNTER — Telehealth: Payer: Self-pay

## 2016-08-15 MED FILL — LOSARTAN POTASSIUM 25 MG TA: 25 | 30 days supply | Qty: 30 | Fill #1

## 2016-08-15 MED FILL — HYDROCHLOROTHIAZIDE 25 MG T: 25 | 30 days supply | Qty: 30 | Fill #1

## 2016-08-15 NOTE — Telephone Encounter (Signed)
Clld pt - x10 rings - no ans machine, just disconnected.  If pt calls back, please advise that her FMLA paperwork is upfront, ready for p/u.

## 2016-08-16 ENCOUNTER — Ambulatory Visit: Payer: Medicaid Other | Admitting: Family Medicine

## 2016-08-20 ENCOUNTER — Encounter: Payer: Self-pay | Admitting: Obstetrics & Gynecology

## 2016-08-20 ENCOUNTER — Ambulatory Visit: Payer: Medicaid Other | Admitting: Obstetrics & Gynecology

## 2016-09-10 ENCOUNTER — Encounter (HOSPITAL_COMMUNITY): Payer: Self-pay | Admitting: Emergency Medicine

## 2016-09-10 ENCOUNTER — Ambulatory Visit (HOSPITAL_COMMUNITY)
Admission: EM | Admit: 2016-09-10 | Discharge: 2016-09-10 | Disposition: A | Discharge status: Home / Self Care | Payer: Medicaid Other | Attending: Internal Medicine | Admitting: Internal Medicine

## 2016-09-10 DIAGNOSIS — J0300 Acute streptococcal tonsillitis, unspecified: Secondary | ICD-10-CM

## 2016-09-10 LAB — POCT RAPID STREP A: Streptococcus, Group A Screen (Direct): POSITIVE — AB

## 2016-09-10 MED ORDER — PENICILLIN G BENZATHINE 1200000 UNIT/2ML IM SUSP
1.2000 10*6.[IU] | Freq: Once | INTRAMUSCULAR | Status: AC
  Administered 2016-09-10: 1.2 10*6.[IU] via INTRAMUSCULAR

## 2016-09-10 MED ORDER — IBUPROFEN 800 MG PO TABS
800.0000 mg | ORAL_TABLET | Freq: Once | ORAL | Status: AC
  Administered 2016-09-10: 800 mg via ORAL

## 2016-09-10 MED ORDER — PENICILLIN G BENZATHINE 1200000 UNIT/2ML IM SUSP
INTRAMUSCULAR | Status: AC
Start: 2016-09-10 — End: 2016-09-10
  Filled 2016-09-10: qty 2

## 2016-09-10 MED ORDER — DEXAMETHASONE SODIUM PHOSPHATE 10 MG/ML IJ SOLN
10.0000 mg | Freq: Once | INTRAMUSCULAR | Status: AC
  Administered 2016-09-10: 10 mg via INTRAMUSCULAR

## 2016-09-10 NOTE — ED Triage Notes (Signed)
[facility] a complaint of a sore throat and right side ear pain that started this am.

## 2016-09-10 NOTE — Discharge Instructions (Addendum)
Strep test was positive at the urgent care today.  An injection of long acting penicillin was given, to treat this.  No further antibiotics needed for the strep throat.  Injection of decadron (steroid, for throat pain) was also given.  Anticipate gradual improvement in well being/throat pain over the next 24-48 hours.  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  Recheck as needed.

## 2016-09-10 NOTE — ED Provider Notes (Signed)
[facility]    CSN: [id number] Arrival date & time: 09/10/16  1047     History   Chief Complaint Chief Complaint  Patient presents with  . Sore Throat  . Otalgia    HPI Crystal Burns is a 36 y.o. female. She presents today with the abrupt onset of right earache and sore throat pain, severe, this morning. She has had cold symptoms for the last week or so, cough and runny nose had improved, but had a headache yesterday and now her throat is very sore. Nausea, no vomiting, no diarrhea. Not really achy. Had increased sinus congestion and cough again yesterday. She has 6 kids and feels a little overwhelmed.  This is her one day off.    HPI  Past Medical History:  Diagnosis Date  . Asthma    2010-only bothers pt during bad cold  . Atypical chest pain 01/16/2016   Atypical chest pain  . Depression    2010-had when mother passed away  . History of gonorrhea   . Hypertension    2004  . Obesity   . Preeclampsia complicating hypertension 01/21/2015    Patient Active Problem List   Diagnosis Date Noted  . Chronic sinusitis 07/23/2016  . Vitamin D deficiency 07/10/2015  . Anxiety state 06/20/2015  . Costochondritis 05/18/2015  . Osteoarthritis of right knee 04/22/2014  . Essential hypertension 07/18/2012  . LUMBOSACRAL STRAIN, ACUTE 07/05/2008  . Morbid obesity (HCC) 02/24/2008  . Asthma with exacerbation 02/24/2008    Past Surgical History:  Procedure Laterality Date  . CHOLECYSTECTOMY    . DILATION AND CURETTAGE OF UTERUS    . WISDOM TOOTH EXTRACTION      OB History    Gravida Para Term Preterm AB Living   11 7 7  0 4 6   SAB TAB Ectopic Multiple Live Births   4 0 0 0 7       Home Medications    Prior to Admission medications   Medication Sig Start Date End Date Taking? Authorizing Provider  acetaminophen (TYLENOL) 325 MG tablet Take 2 tablets (650 mg total) by mouth every 6 (six) hours as needed. 05/27/16   Derwood Kaplan, MD  cetirizine  (ZYRTEC) 10 MG tablet Take 1 tablet (10 mg total) by mouth daily. 07/16/16   Jaclyn Shaggy, MD  ergocalciferol (DRISDOL) 50000 units capsule Take 1 capsule (50,000 Units total) by mouth once a week. 07/25/16   Jaclyn Shaggy, MD  fluticasone (FLONASE) 50 MCG/ACT nasal spray Place 2 sprays into both nostrils daily. 07/16/16   Jaclyn Shaggy, MD  guaiFENesin (MUCINEX) 600 MG 12 hr tablet Take 1 tablet (600 mg total) by mouth 2 (two) times daily. 07/23/16   Jaclyn Shaggy, MD  ibuprofen (ADVIL,MOTRIN) 600 MG tablet Take 1 tablet (600 mg total) by mouth every 6 (six) hours as needed. 05/27/16   Derwood Kaplan, MD  losartan (COZAAR) 25 MG tablet Take 1 tablet (25 mg total) by mouth daily. 07/16/16   Jaclyn Shaggy, MD  norethindrone (CAMILA) 0.35 MG tablet Take 1 tablet by mouth daily.    [provider]    Family History Family History  Problem Relation Age of Onset  . Hypertension Mother   . Diabetes Mother   . Cancer Mother   . Hypertension Father   . Diabetes Father   . Hyperlipidemia Father   . Arthritis Unknown   . Asthma Unknown   . Breast cancer Unknown   . Heart failure Unknown   .  Congenital heart disease Unknown   . Depression Unknown   . Heart attack Unknown     Social History Social History  Substance Use Topics  . Smoking status: Former Smoker    Types: Cigarettes    Quit date: 10/31/2010  . Smokeless tobacco: Never Used  . Alcohol use No     Allergies   Bee venom; Lisinopril; and Shellfish allergy   Review of Systems Review of Systems  All other systems reviewed and are negative.    Physical Exam Triage Vital Signs ED Triage Vitals  Enc Vitals Group     BP 09/10/16 1117 138/83     Pulse Rate 09/10/16 1117 89     Resp 09/10/16 1117 18     Temp 09/10/16 1117 99.1 F (37.3 C)     Temp Source 09/10/16 1117 Oral     SpO2 09/10/16 1117 100 %     Weight --      Height --      Pain Score 09/10/16 1115 10     Pain Loc --    Updated Vital Signs BP  138/83 (BP Location: Right Arm)   Pulse 89   Temp 99.1 F (37.3 C) (Oral)   Resp 18   SpO2 100%   Physical Exam  Constitutional: She is oriented to person, place, and time.  Voice is very congested, looks ill but not toxic  HENT:  Head: Atraumatic.  Right TM is dull/opaque, pink flushed, canal is erythematous Left TM is moderately dull, no erythema Marked nasal congestion Prominent deep red tonsils, no clear exudate seen  Eyes:  Conjugate gaze observed, no eye redness/discharge  Neck: Neck supple.  Cardiovascular: Normal rate and regular rhythm.   Pulmonary/Chest: No respiratory distress. She has no wheezes. She has no rales.  Lungs are clear, symmetric breath sounds throughout  Abdominal: She exhibits no distension.  Musculoskeletal: Normal range of motion.  Neurological: She is alert and oriented to person, place, and time.  Skin: Skin is warm and dry.  Nursing note and vitals reviewed.    UC Treatments / Results  Labs (all labs ordered are listed, but only abnormal results are displayed) Labs Reviewed  POCT RAPID STREP A - Abnormal; Notable for the following:       Result Value   Streptococcus, Group A Screen (Direct) POSITIVE (*)    All other components within normal limits     Procedures Procedures (including critical care time)  Medications Ordered in UC Medications  ibuprofen (ADVIL,MOTRIN) tablet 800 mg (800 mg Oral Given 09/10/16 1208)  dexamethasone (DECADRON) injection 10 mg (10 mg Intramuscular Given 09/10/16 1208)  penicillin g benzathine (BICILLIN LA) 1200000 UNIT/2ML injection 1.2 Million Units (1.2 Million Units Intramuscular Given 09/10/16 1240)      Final Clinical Impressions(s) / UC Diagnoses   Final diagnoses:  Strep tonsillitis   Strep test was positive at the urgent care today.  An injection of long acting penicillin was given, to treat this.  No further antibiotics needed for the strep throat.  Injection of decadron (steroid, for throat pain)  was also given.  Anticipate gradual improvement in well being/throat pain over the next 24-48 hours.  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  Recheck as needed.   Eustace MooreMurray, Christinna Sprung W, MD 09/11/16 1536

## 2016-09-13 MED FILL — LOSARTAN POTASSIUM 25 MG TA: 25 | 30 days supply | Qty: 30 | Fill #2

## 2016-09-13 MED FILL — HYDROCHLOROTHIAZIDE 25 MG T: 25 | 30 days supply | Qty: 30 | Fill #2

## 2016-10-14 MED FILL — HYDROCHLOROTHIAZIDE 25 MG T: 25 | 30 days supply | Qty: 30 | Fill #3

## 2016-10-14 MED FILL — LOSARTAN POTASSIUM 25 MG TA: 25 | 30 days supply | Qty: 30 | Fill #3

## 2016-10-15 ENCOUNTER — Telehealth: Payer: Self-pay | Admitting: Family Medicine

## 2016-10-15 NOTE — Telephone Encounter (Signed)
Pt. Came to facility to drop off FMLA paperwork to be filled out by PCP. Pt. States that the paperwork was sent back b/c it was not filled out correctly. Pt. States that in the packet there is a letter explaining what they need. Paperwork will be put in PCP box. Please f/u

## 2016-10-21 ENCOUNTER — Ambulatory Visit: Payer: Medicaid Other | Admitting: Family Medicine

## 2016-10-23 NOTE — Telephone Encounter (Signed)
Left message on voicemail: paperwork complete, may pick up at the front desk .

## 2016-10-31 ENCOUNTER — Ambulatory Visit: Payer: Medicaid Other | Attending: Family Medicine | Admitting: *Deleted

## 2016-10-31 VITALS — BP 124/83 | HR 83

## 2016-10-31 DIAGNOSIS — I10 Essential (primary) hypertension: Secondary | ICD-10-CM

## 2016-10-31 NOTE — Patient Instructions (Addendum)
Patients blood pressure resulted in normal range.

## 2016-11-12 MED FILL — HYDROCHLOROTHIAZIDE 25 MG T: 25 | 30 days supply | Qty: 30 | Fill #4

## 2016-11-12 MED FILL — LOSARTAN POTASSIUM 25 MG TA: 25 | 30 days supply | Qty: 30 | Fill #2

## 2016-12-03 ENCOUNTER — Encounter: Payer: Self-pay | Admitting: Family Medicine

## 2016-12-03 ENCOUNTER — Ambulatory Visit: Payer: Self-pay | Attending: Family Medicine | Admitting: Family Medicine

## 2016-12-03 VITALS — BP 123/84 | HR 77 | Temp 98.5°F | Ht 63.0 in | Wt 297.0 lb

## 2016-12-03 DIAGNOSIS — H538 Other visual disturbances: Secondary | ICD-10-CM | POA: Insufficient documentation

## 2016-12-03 DIAGNOSIS — R42 Dizziness and giddiness: Secondary | ICD-10-CM | POA: Insufficient documentation

## 2016-12-03 DIAGNOSIS — Z9889 Other specified postprocedural states: Secondary | ICD-10-CM | POA: Insufficient documentation

## 2016-12-03 DIAGNOSIS — F329 Major depressive disorder, single episode, unspecified: Secondary | ICD-10-CM | POA: Insufficient documentation

## 2016-12-03 DIAGNOSIS — J45909 Unspecified asthma, uncomplicated: Secondary | ICD-10-CM | POA: Insufficient documentation

## 2016-12-03 DIAGNOSIS — Z91030 Bee allergy status: Secondary | ICD-10-CM | POA: Insufficient documentation

## 2016-12-03 DIAGNOSIS — Z888 Allergy status to other drugs, medicaments and biological substances status: Secondary | ICD-10-CM | POA: Insufficient documentation

## 2016-12-03 DIAGNOSIS — H1013 Acute atopic conjunctivitis, bilateral: Secondary | ICD-10-CM | POA: Insufficient documentation

## 2016-12-03 DIAGNOSIS — J329 Chronic sinusitis, unspecified: Secondary | ICD-10-CM | POA: Insufficient documentation

## 2016-12-03 DIAGNOSIS — I10 Essential (primary) hypertension: Secondary | ICD-10-CM | POA: Insufficient documentation

## 2016-12-03 DIAGNOSIS — Z79899 Other long term (current) drug therapy: Secondary | ICD-10-CM | POA: Insufficient documentation

## 2016-12-03 DIAGNOSIS — Z91013 Allergy to seafood: Secondary | ICD-10-CM | POA: Insufficient documentation

## 2016-12-03 DIAGNOSIS — E669 Obesity, unspecified: Secondary | ICD-10-CM | POA: Insufficient documentation

## 2016-12-03 DIAGNOSIS — J328 Other chronic sinusitis: Secondary | ICD-10-CM

## 2016-12-03 DIAGNOSIS — M1711 Unilateral primary osteoarthritis, right knee: Secondary | ICD-10-CM | POA: Insufficient documentation

## 2016-12-03 DIAGNOSIS — Z9049 Acquired absence of other specified parts of digestive tract: Secondary | ICD-10-CM | POA: Insufficient documentation

## 2016-12-03 MED ORDER — LEVOCETIRIZINE DIHYDROCHLORIDE 5 MG PO TABS: 5.0000 mg | ORAL_TABLET | Freq: Every evening | ORAL | 2 refills | Status: DC

## 2016-12-03 MED ORDER — OLOPATADINE HCL 0.2 % OP SOLN: 1.0000 [drp] | Freq: Two times a day (BID) | OPHTHALMIC | 1 refills | Status: DC

## 2016-12-03 NOTE — Progress Notes (Signed)
Subjective:  Patient ID: Crystal Burns, female    DOB: 03/20/1981  Age: 36 y.o. MRN: [id number]  CC: Headache   HPI Crystal Burns is a 36 year old female with a history of hypertension, chronic sinusitis who presents today with 2 week history of waking up with discharge on the medial aspect of her eyes and intermittent blurry vision but denies redness or pain in her eyes; sometimes feels like she has rocks her eyes.  She has also noticed right ear popping which she describes as being unable to pop open for a few days. It sometimes feels like she has a balloon in her head. She has also noticed dizziness and headaches; informs me she has been taking her Zyrtec and Flonase. Denies fever, shortness of breath or cough.  She does have knee pain which has been exacerbated by a recent fall after she tripped on a wet floor at home and fell on her right knee. She suffers from right knee osteoarthritis for which she takes ibuprofen 200 mg.  Admits to being stressed from having to work and care for all her 6 children.  Past Medical History:  Diagnosis Date  . Asthma    2010-only bothers pt during bad cold  . Atypical chest pain 01/16/2016   Atypical chest pain  . Depression    2010-had when mother passed away  . History of gonorrhea   . Hypertension    2004  . Obesity   . Preeclampsia complicating hypertension 01/21/2015    Past Surgical History:  Procedure Laterality Date  . CHOLECYSTECTOMY    . DILATION AND CURETTAGE OF UTERUS    . WISDOM TOOTH EXTRACTION      Allergies  Allergen Reactions  . Bee Venom Hives and Swelling  . Lisinopril Shortness Of Breath and Cough  . Shellfish Allergy Anaphylaxis     Outpatient Medications Prior to Visit  Medication Sig Dispense Refill  . fluticasone (FLONASE) 50 MCG/ACT nasal spray Place 2 sprays into both nostrils daily. 16 g 3  . losartan (COZAAR) 25 MG tablet Take 1 tablet (25 mg total) by mouth daily. 30 tablet 3  .  norethindrone (CAMILA) 0.35 MG tablet Take 1 tablet by mouth daily.    Marland Kitchen acetaminophen (TYLENOL) 325 MG tablet Take 2 tablets (650 mg total) by mouth every 6 (six) hours as needed. (Patient not taking: Reported on 12/03/2016) 30 tablet 0  . ergocalciferol (DRISDOL) 50000 units capsule Take 1 capsule (50,000 Units total) by mouth once a week. (Patient not taking: Reported on 12/03/2016) 9 capsule 0  . guaiFENesin (MUCINEX) 600 MG 12 hr tablet Take 1 tablet (600 mg total) by mouth 2 (two) times daily. (Patient not taking: Reported on 12/03/2016) 20 tablet 0  . ibuprofen (ADVIL,MOTRIN) 600 MG tablet Take 1 tablet (600 mg total) by mouth every 6 (six) hours as needed. (Patient not taking: Reported on 12/03/2016) 30 tablet 0  . cetirizine (ZYRTEC) 10 MG tablet Take 1 tablet (10 mg total) by mouth daily. (Patient not taking: Reported on 12/03/2016) 30 tablet 3   No facility-administered medications prior to visit.     ROS Review of Systems  Constitutional: Negative for activity change, appetite change and fatigue.  HENT: Negative for congestion, sinus pressure and sore throat.   Eyes: Positive for discharge. Negative for visual disturbance.  Respiratory: Negative for cough, chest tightness, shortness of breath and wheezing.   Cardiovascular: Negative for chest pain and palpitations.  Gastrointestinal: Negative for abdominal distention, abdominal pain and  constipation.  Endocrine: Negative for polydipsia.  Genitourinary: Negative for dysuria and frequency.  Musculoskeletal: Negative for arthralgias and back pain.  Skin: Negative for rash.  Neurological: Positive for light-headedness and headaches. Negative for tremors and numbness.  Hematological: Does not bruise/bleed easily.  Psychiatric/Behavioral: Negative for agitation and behavioral problems.    Objective:  BP 123/84   Pulse 77   Temp 98.5 F (36.9 C) (Oral)   Ht 5\' 3"  (1.6 m)   Wt 297 lb (134.7 kg)   SpO2 97%   BMI 52.61 kg/m    BP/Weight 12/03/2016 10/31/2016 09/10/2016  Systolic BP 123 124 138  Diastolic BP 84 83 83  Wt. (Lbs) 297 - -  BMI 52.61 - -      Physical Exam  Constitutional: She is oriented to person, place, and time. She appears well-developed and well-nourished.  HENT:  Right Ear: External ear normal.  Left Ear: External ear normal.  Mouth/Throat: Oropharynx is clear and moist.  Eyes: Pupils are equal, round, and reactive to light. Conjunctivae and EOM are normal. Right eye exhibits no discharge. Left eye exhibits no discharge.  Cardiovascular: Normal rate, normal heart sounds and intact distal pulses.   No murmur heard. Pulmonary/Chest: Effort normal and breath sounds normal. She has no wheezes. She has no rales. She exhibits no tenderness.  Abdominal: Soft. Bowel sounds are normal. She exhibits no distension and no mass. There is no tenderness.  Musculoskeletal: Normal range of motion.  Neurological: She is alert and oriented to person, place, and time.     Assessment & Plan:   1. Other chronic sinusitis Switch from Zyrtec to Xyzal Unsure of compliance Advised to use Sudafed intermittently Continue Flonase - levocetirizine (XYZAL) 5 MG tablet; Take 1 tablet (5 mg total) by mouth every evening.  Dispense: 30 tablet; Refill: 2  2. Allergic conjunctivitis of both eyes No evidence of bacterial infection - Olopatadine HCl (PATADAY) 0.2 % SOLN; Apply 1 drop to eye 2 (two) times daily.  Dispense: 2.5 mL; Refill: 1  3. Primary osteoarthritis of right knee Exacerbated by recent fall Currently on ibuprofen and advised to increase dose from 200 mg to 600 mg every 8 hours as needed She would not like to try other stronger medication at tramadol Declines prednisone due to weight gain side effect   Meds ordered this encounter  Medications  . Olopatadine HCl (PATADAY) 0.2 % SOLN    Sig: Apply 1 drop to eye 2 (two) times daily.    Dispense:  2.5 mL    Refill:  1  . levocetirizine (XYZAL) 5  MG tablet    Sig: Take 1 tablet (5 mg total) by mouth every evening.    Dispense:  30 tablet    Refill:  2    Follow-up: Return in about 1 month (around 01/03/2017) for Follow-up of sinusitis.   Jaclyn Shaggy MD

## 2016-12-03 NOTE — Patient Instructions (Signed)

## 2016-12-04 IMAGING — CT CT HEAD W/O CM
3 of 4 series · 18 of 47 positions shown, 21 images · non-contrast
Comparison: None available (05/25/1999)

CLINICAL DATA: Left arm tingling since yesterday.

EXAM:
CT HEAD WITHOUT CONTRAST
TECHNIQUE: Contiguous axial images were obtained from the base of the skull
through the vertex without intravenous contrast.

[Series 201: head w/o, idose (1) · axial · non-contrast · 0.36mm/px · z∈[+50,+170]mm · 12 of 28 slices shown, 15 images]
[im 2/28  brain]
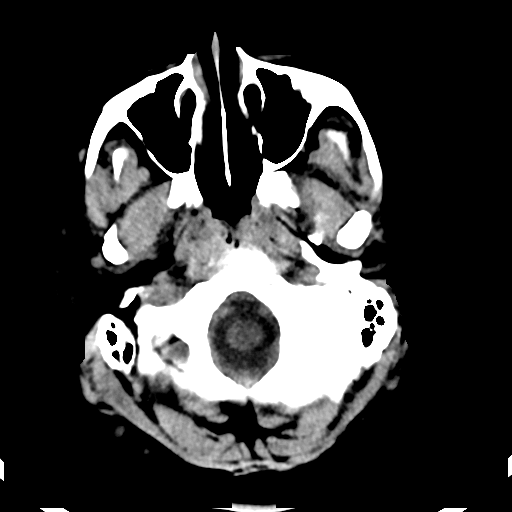
[im 2/28  bone]
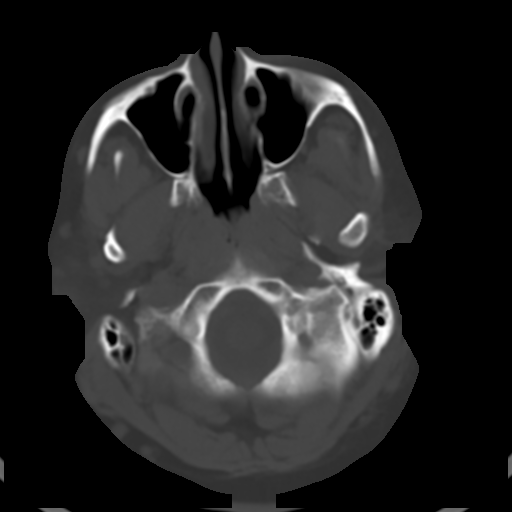
[im 4/28  brain]
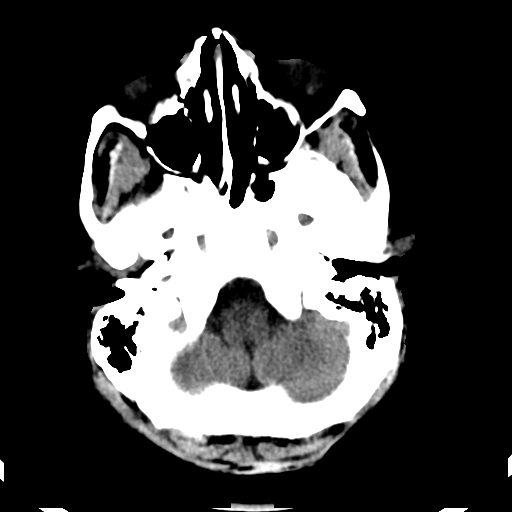
[im 6/28  brain]
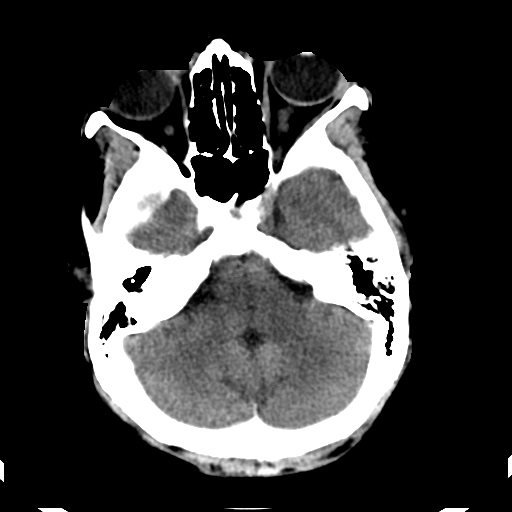
[im 8/28  brain]
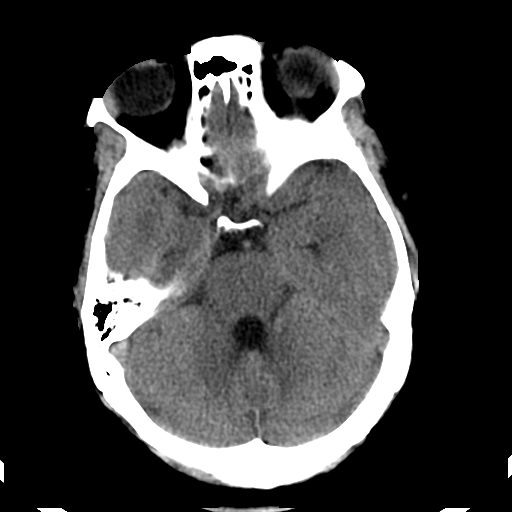
[im 10/28  brain]
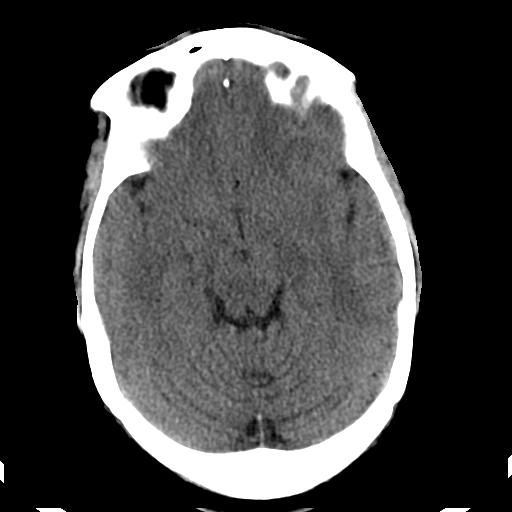
[im 10/28  bone]
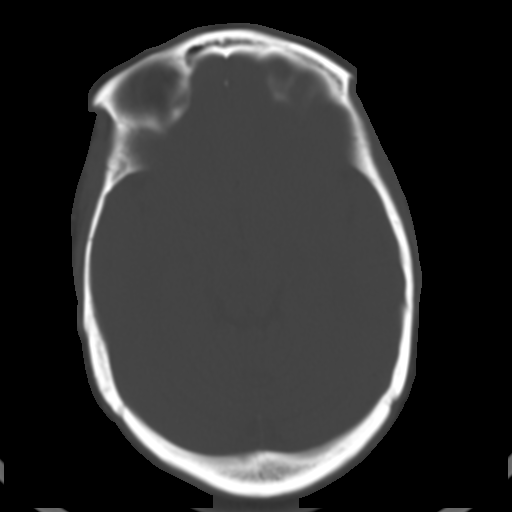
[im 12/28  brain]
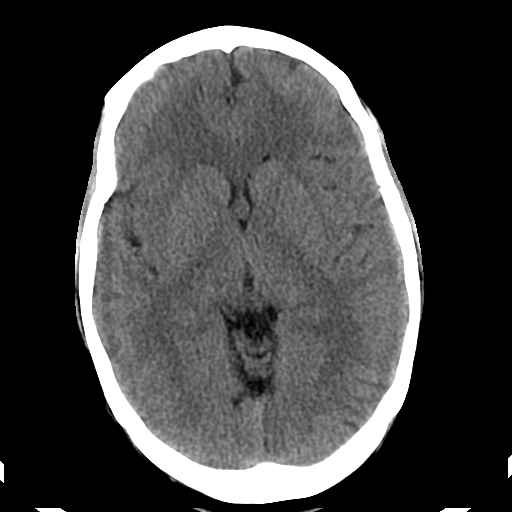
[im 16/28  brain]
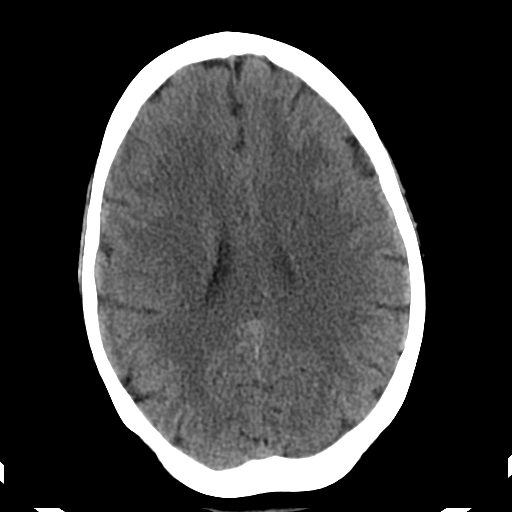
[im 18/28  brain]
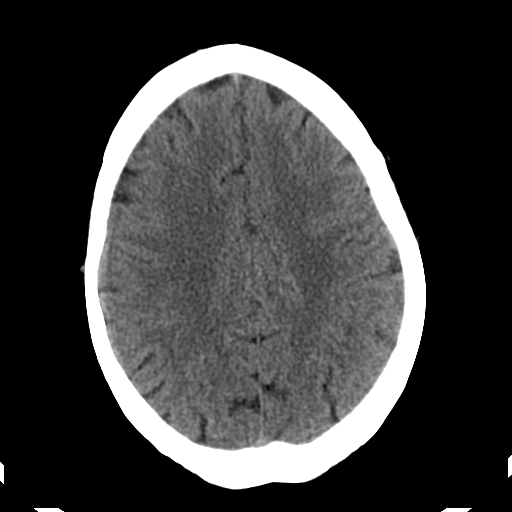
[im 20/28  brain]
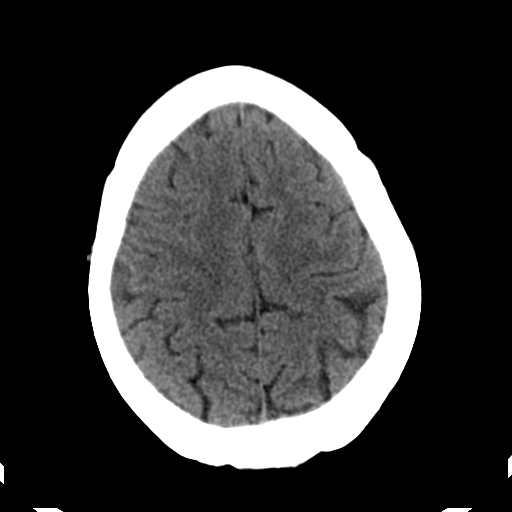
[im 20/28  bone]
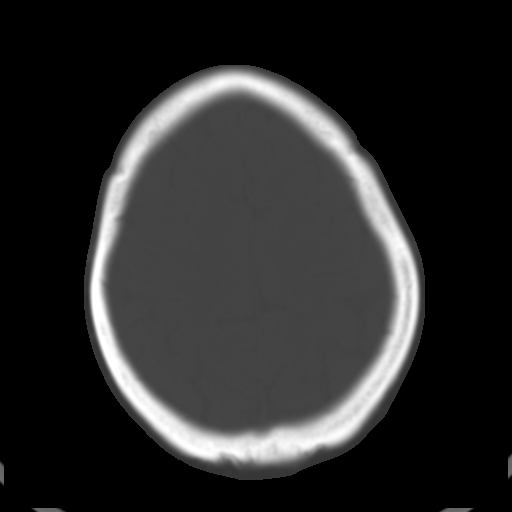
[im 22/28  brain]
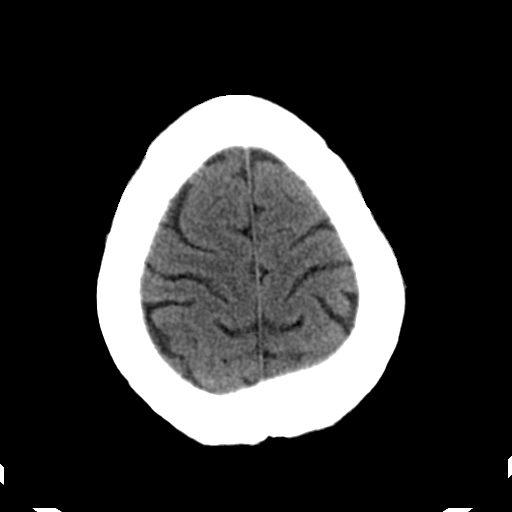
[im 24/28  brain]
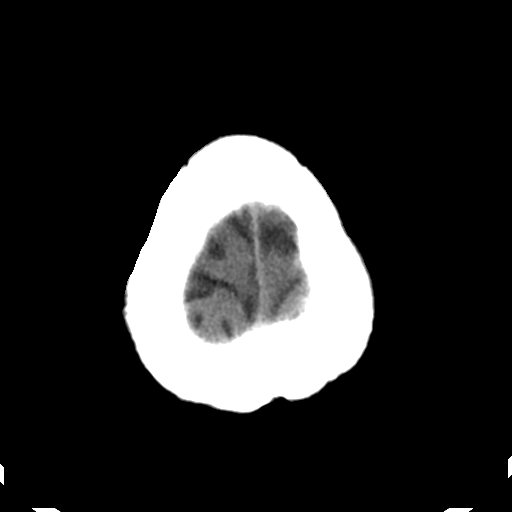
[im 26/28  brain]
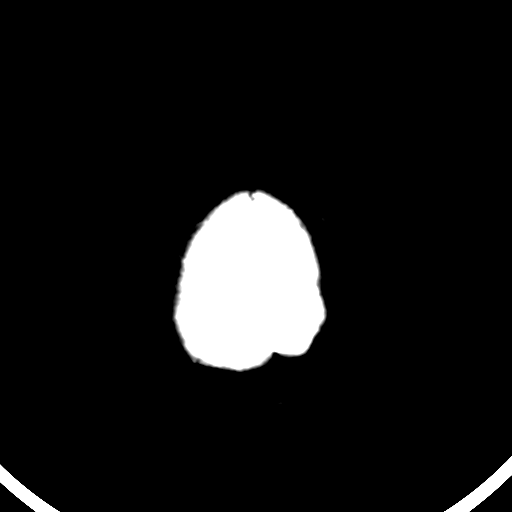

[Series 204: sagittal st, idose (1) · sagittal · 0.36mm/px · 3 of 60 slices shown]
[im 20/60  brain]
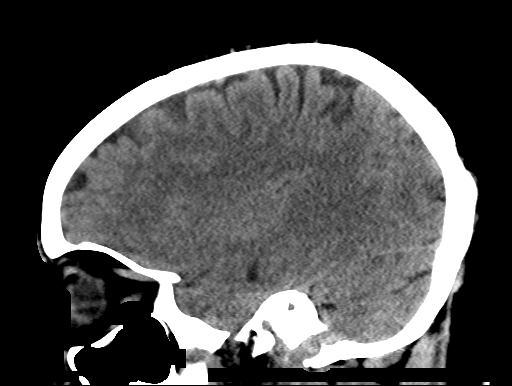
[im 30/60  brain]
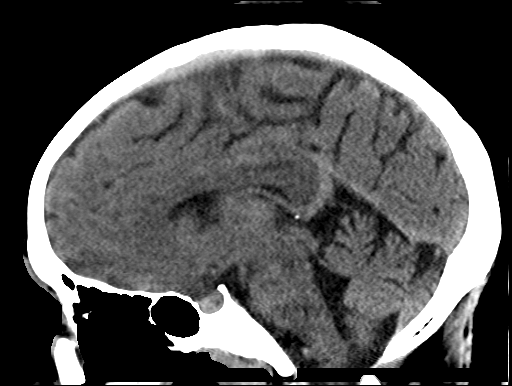
[im 40/60  brain]
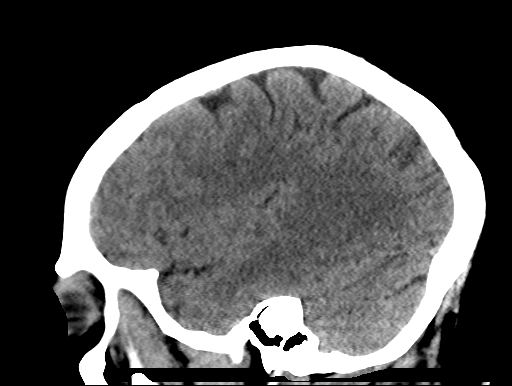

[Series 205: coronal st, idose (1) · coronal · 0.42mm/px · 3 of 58 slices shown]
[im 20/58  brain]
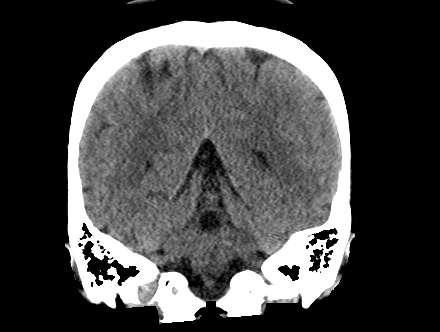
[im 26/58  brain]
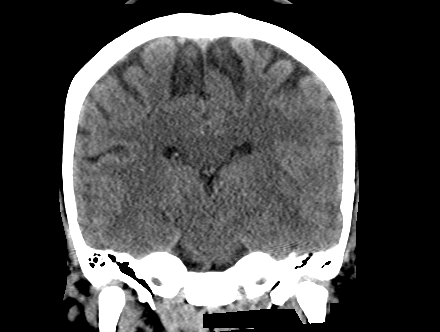
[im 32/58  brain]
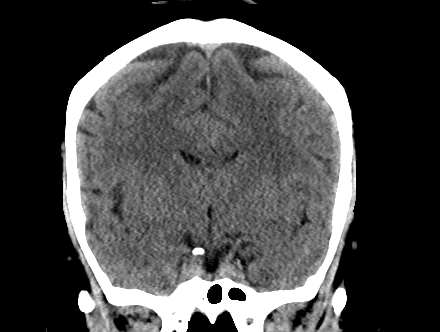

[18 of 47 positions shown; findings below may reference images not displayed]

FINDINGS: Brain: Unremarkable. No evidence of infarction, hemorrhage,
hydrocephalus, or mass lesion/mass effect.

Vascular: No hyperdense vessel or unexpected calcification.

Skull: Negative for fracture or focal lesion.

Sinuses/Orbits: No acute finding.

Other: None.
IMPRESSION: Negative exam.

## 2016-12-13 ENCOUNTER — Other Ambulatory Visit: Payer: Self-pay | Admitting: Family Medicine

## 2016-12-13 DIAGNOSIS — I10 Essential (primary) hypertension: Secondary | ICD-10-CM

## 2016-12-13 MED ORDER — LOSARTAN POTASSIUM 25 MG PO TABS
25.0000 mg | ORAL_TABLET | Freq: Every day | ORAL | 3 refills | Status: DC
Start: 2016-12-13 — End: 2016-12-13

## 2016-12-13 MED FILL — HYDROCHLOROTHIAZIDE 25 MG T: 25 | 30 days supply | Qty: 30 | Fill #5

## 2016-12-13 MED FILL — LOSARTAN POTASSIUM 25 MG TA: 25 | 30 days supply | Qty: 30 | Fill #0

## 2017-01-01 ENCOUNTER — Ambulatory Visit: Payer: Medicaid Other | Admitting: Family Medicine

## 2017-01-14 ENCOUNTER — Ambulatory Visit: Payer: Self-pay | Admitting: Family Medicine

## 2017-01-14 MED FILL — HYDROCHLOROTHIAZIDE 25 MG T: 25 | 30 days supply | Qty: 30 | Fill #6

## 2017-01-14 MED FILL — LOSARTAN POTASSIUM 25 MG TA: 25 | 30 days supply | Qty: 30 | Fill #1

## 2017-01-27 ENCOUNTER — Encounter (HOSPITAL_COMMUNITY): Payer: Self-pay | Admitting: Emergency Medicine

## 2017-01-27 ENCOUNTER — Ambulatory Visit (HOSPITAL_COMMUNITY)
Admission: EM | Admit: 2017-01-27 | Discharge: 2017-01-27 | Disposition: A | Discharge status: Home / Self Care | Payer: Self-pay

## 2017-01-27 DIAGNOSIS — M25561 Pain in right knee: Secondary | ICD-10-CM

## 2017-01-27 MED ORDER — NAPROXEN 500 MG PO TABS: 500.0000 mg | ORAL_TABLET | Freq: Two times a day (BID) | ORAL | 0 refills | Status: DC

## 2017-01-27 NOTE — ED Triage Notes (Signed)
Leg pain for 2 weeks.  Patient says she has had cortisone injections and prednisone.  Right knee is painful and swollen, lateral knee.  No fall per patient, no known injury.  Patient has used ice on knee.  Patient says pain radiates down lower leg.

## 2017-01-27 NOTE — ED Provider Notes (Signed)
[facility]    CSN: [id number] Arrival date & time: 01/27/17  1648     History   Chief Complaint Chief Complaint  Patient presents with  . Leg Pain    HPI Crystal Burns is a 36 y.o. female.   Crystal Burns presents with complaints of right knee pain which has been chronic in nature but exacerbated over the past three days. She has had cortisone injections in the past which have helped. The pain radiates down her shin and calf. No known injury. She works at a desk, minimal ambulatory activity. Worse with waking in the morning. More painful with activity. Took ibuprofen at 1100 this am which did not seem to help. Pain is moderate in severity. Feels similar to previous episodes of knee pain. Without numbness or tingling to her leg or foot.       Past Medical History:  Diagnosis Date  . Asthma    2010-only bothers pt during bad cold  . Atypical chest pain 01/16/2016   Atypical chest pain  . Depression    2010-had when mother passed away  . History of gonorrhea   . Hypertension    2004  . Obesity   . Preeclampsia complicating hypertension 01/21/2015    Patient Active Problem List   Diagnosis Date Noted  . Chronic sinusitis 07/23/2016  . Vitamin D deficiency 07/10/2015  . Anxiety state 06/20/2015  . Costochondritis 05/18/2015  . Osteoarthritis of right knee 04/22/2014  . Essential hypertension 07/18/2012  . LUMBOSACRAL STRAIN, ACUTE 07/05/2008  . Morbid obesity (HCC) 02/24/2008  . Asthma with exacerbation 02/24/2008    Past Surgical History:  Procedure Laterality Date  . CHOLECYSTECTOMY    . DILATION AND CURETTAGE OF UTERUS    . WISDOM TOOTH EXTRACTION      OB History    Gravida Para Term Preterm AB Living   11 7 7  0 4 6   SAB TAB Ectopic Multiple Live Births   4 0 0 0 7       Home Medications    Prior to Admission medications   Medication Sig Start Date End Date Taking? Authorizing Provider  HYDROCHLOROTHIAZIDE PO Take by mouth.   Yes  [provider]  losartan (COZAAR) 25 MG tablet TAKE 1 TABLET BY MOUTH DAILY. 12/13/16  Yes Jaclyn Shaggy, MD  norethindrone (CAMILA) 0.35 MG tablet Take 1 tablet by mouth daily.   Yes [provider]  omeprazole (PRILOSEC) 20 MG capsule Take 20 mg by mouth daily.   Yes [provider]  acetaminophen (TYLENOL) 325 MG tablet Take 2 tablets (650 mg total) by mouth every 6 (six) hours as needed. Patient not taking: Reported on 12/03/2016 05/27/16   Derwood Kaplan, MD  ergocalciferol (DRISDOL) 50000 units capsule Take 1 capsule (50,000 Units total) by mouth once a week. Patient not taking: Reported on 12/03/2016 07/25/16   Jaclyn Shaggy, MD  fluticasone (FLONASE) 50 MCG/ACT nasal spray Place 2 sprays into both nostrils daily. 07/16/16   Jaclyn Shaggy, MD  guaiFENesin (MUCINEX) 600 MG 12 hr tablet Take 1 tablet (600 mg total) by mouth 2 (two) times daily. Patient not taking: Reported on 12/03/2016 07/23/16   Jaclyn Shaggy, MD  ibuprofen (ADVIL,MOTRIN) 600 MG tablet Take 1 tablet (600 mg total) by mouth every 6 (six) hours as needed. Patient not taking: Reported on 12/03/2016 05/27/16   Derwood Kaplan, MD  levocetirizine (XYZAL) 5 MG tablet Take 1 tablet (5 mg total) by mouth every evening. 12/03/16   Amao,  Odette Horns, MD  naproxen (NAPROSYN) 500 MG tablet Take 1 tablet (500 mg total) by mouth 2 (two) times daily with a meal. 01/27/17   Kenesha Moshier, Dorene Grebe B, NP  Olopatadine HCl (PATADAY) 0.2 % SOLN Apply 1 drop to eye 2 (two) times daily. 12/03/16   Jaclyn Shaggy, MD    Family History Family History  Problem Relation Age of Onset  . Hypertension Mother   . Diabetes Mother   . Cancer Mother   . Hypertension Father   . Diabetes Father   . Hyperlipidemia Father   . Arthritis Unknown   . Asthma Unknown   . Breast cancer Unknown   . Heart failure Unknown   . Congenital heart disease Unknown   . Depression Unknown   . Heart attack Unknown     Social History Social History    Substance Use Topics  . Smoking status: Former Smoker    Types: Cigarettes    Quit date: 10/31/2010  . Smokeless tobacco: Never Used  . Alcohol use No     Allergies   Bee venom; Lisinopril; and Shellfish allergy   Review of Systems Review of Systems  Constitutional: Negative.   HENT: Negative.   Respiratory: Negative.   Musculoskeletal: Positive for arthralgias.  Neurological: Negative.      Physical Exam Triage Vital Signs ED Triage Vitals  Enc Vitals Group     BP 01/27/17 1657 (!) 119/59     Pulse Rate 01/27/17 1657 70     Resp 01/27/17 1657 20     Temp 01/27/17 1657 98.1 F (36.7 C)     Temp src --      SpO2 01/27/17 1657 100 %     Weight --      Height --      Head Circumference --      Peak Flow --      Pain Score 01/27/17 1654 8     Pain Loc --      Pain Edu? --      Excl. in GC? --    No data found.   Updated Vital Signs BP (!) 119/59 (BP Location: Left Arm) Comment (BP Location): large cuff  Pulse 70   Temp 98.1 F (36.7 C)   Resp 20   SpO2 100%   Visual Acuity Right Eye Distance:   Left Eye Distance:   Bilateral Distance:    Right Eye Near:   Left Eye Near:    Bilateral Near:     Physical Exam  Constitutional: She is oriented to person, place, and time. She appears well-developed and well-nourished. No distress.  Cardiovascular: Normal rate, regular rhythm and normal heart sounds.   Pulmonary/Chest: Effort normal and breath sounds normal.  Musculoskeletal:       Right knee: She exhibits decreased range of motion and swelling. She exhibits no ecchymosis, no deformity, no laceration, no erythema, normal alignment, no LCL laxity, normal patellar mobility, no bony tenderness, normal meniscus and no MCL laxity. Tenderness found. Lateral joint line and LCL tenderness noted.  Non specific lateral knee pain, worse with complete flexion and complete extension; without tendon laxity. Difficult assessing swelling due to body habitus. Without warmth  or redness  Neurological: She is alert and oriented to person, place, and time.  Skin: Skin is warm and dry.     UC Treatments / Results  Labs (all labs ordered are listed, but only abnormal results are displayed) Labs Reviewed - No data to display  EKG  EKG Interpretation  None       Radiology No results found.  Procedures Procedures (including critical care time)  Medications Ordered in UC Medications - No data to display   Initial Impression / Assessment and Plan / UC Course  I have reviewed the triage vital signs and the nursing notes.  Pertinent labs & imaging results that were available during my care of the patient were reviewed by me and considered in my medical decision making (see chart for details).     Naproxen twice a day for likely arthritic pain to right knee. Recommended light regular activity. Ice following activity. If pain persists or worsens to follow with PCP, may need another injection in the future. Patient verbalized understanding and agreeable to plan.    Final Clinical Impressions(s) / UC Diagnoses   Final diagnoses:  Right knee pain, unspecified chronicity    New Prescriptions New Prescriptions   NAPROXEN (NAPROSYN) 500 MG TABLET    Take 1 tablet (500 mg total) by mouth 2 (two) times daily with a meal.     Controlled Substance Prescriptions [place] Controlled Substance Registry consulted? Not Applicable   Georgetta HaberBurky, Decklyn Hyder B, NP 01/27/17 1747

## 2017-01-29 ENCOUNTER — Encounter: Payer: Self-pay | Admitting: Family Medicine

## 2017-01-29 ENCOUNTER — Ambulatory Visit: Payer: Self-pay | Attending: Family Medicine | Admitting: Family Medicine

## 2017-01-29 VITALS — BP 137/84 | HR 77 | Temp 97.5°F | Ht 63.0 in | Wt 298.4 lb

## 2017-01-29 DIAGNOSIS — G4700 Insomnia, unspecified: Secondary | ICD-10-CM | POA: Insufficient documentation

## 2017-01-29 DIAGNOSIS — F329 Major depressive disorder, single episode, unspecified: Secondary | ICD-10-CM | POA: Insufficient documentation

## 2017-01-29 DIAGNOSIS — G44229 Chronic tension-type headache, not intractable: Secondary | ICD-10-CM | POA: Insufficient documentation

## 2017-01-29 DIAGNOSIS — J329 Chronic sinusitis, unspecified: Secondary | ICD-10-CM | POA: Insufficient documentation

## 2017-01-29 DIAGNOSIS — I10 Essential (primary) hypertension: Secondary | ICD-10-CM | POA: Insufficient documentation

## 2017-01-29 DIAGNOSIS — J45909 Unspecified asthma, uncomplicated: Secondary | ICD-10-CM | POA: Insufficient documentation

## 2017-01-29 DIAGNOSIS — Z79899 Other long term (current) drug therapy: Secondary | ICD-10-CM | POA: Insufficient documentation

## 2017-01-29 DIAGNOSIS — Z6841 Body Mass Index (BMI) 40.0 and over, adult: Secondary | ICD-10-CM | POA: Insufficient documentation

## 2017-01-29 DIAGNOSIS — M1711 Unilateral primary osteoarthritis, right knee: Secondary | ICD-10-CM | POA: Insufficient documentation

## 2017-01-29 MED ORDER — BUTALBITAL-APAP-CAFFEINE 50-325-40 MG PO TABS: 1.0000 | ORAL_TABLET | Freq: Two times a day (BID) | ORAL | 0 refills | Status: DC | PRN

## 2017-01-29 MED ORDER — PREDNISONE 20 MG PO TABS: 20.0000 mg | ORAL_TABLET | Freq: Every day | ORAL | 0 refills | Status: DC

## 2017-01-29 MED FILL — ?PREDNISONE 20MG TABLET: 20 | 5 days supply | Qty: 5 | Fill #0

## 2017-01-29 NOTE — Patient Instructions (Signed)

## 2017-01-30 NOTE — Progress Notes (Signed)
Subjective:  Patient ID: Crystal Burns, female    DOB: [birth year]  Age: 36 y.o. MRN: [id number]  CC: Knee Pain   HPI Crystal Burns is a 36 year old female with a history of hypertension, chronic sinusitis, morbid obesity who presents today with complaints of right knee pain and swelling.  She had an ED visit 2 days ago where she received naproxen but she is yet to pick this up from the pharmacy. Her knee pain is chronic and she has received corticosteroid injections in the past with some improvement. She denies any recent trauma but has gained 50 pounds in the last 1 year. Pain is rated as a 10/10 and is worsened by going up and down the stairs. She has had no recent knee x-rays; her last right knee x-ray was from 03/2014 which revealed suprapatellar knee joint effusion, early osteoarthritis with narrowing of the medial compartments and osteophyte formation.  She also complains of a 3 day history of headaches in the right temporal region which is rated as moderate. She has had intermittent headaches in the past and suffers from chronic sinusitis. Informs me she has been compliant with Flonase. She also endorses insomnia and has never been tested for sleep apnea.  Past Medical History:  Diagnosis Date  . Asthma    2010-only bothers pt during bad cold  . Atypical chest pain 01/16/2016   Atypical chest pain  . Depression    2010-had when mother passed away  . History of gonorrhea   . Hypertension    2004  . Obesity   . Preeclampsia complicating hypertension 01/21/2015    Past Surgical History:  Procedure Laterality Date  . CHOLECYSTECTOMY    . DILATION AND CURETTAGE OF UTERUS    . WISDOM TOOTH EXTRACTION      Allergies  Allergen Reactions  . Bee Venom Hives and Swelling  . Lisinopril Shortness Of Breath and Cough  . Shellfish Allergy Anaphylaxis     Outpatient Medications Prior to Visit  Medication Sig Dispense Refill  . fluticasone (FLONASE) 50 MCG/ACT  nasal spray Place 2 sprays into both nostrils daily. 16 g 3  . HYDROCHLOROTHIAZIDE PO Take by mouth.    . losartan (COZAAR) 25 MG tablet TAKE 1 TABLET BY MOUTH DAILY. 30 tablet 2  . norethindrone (CAMILA) 0.35 MG tablet Take 1 tablet by mouth daily.    Marland Kitchen acetaminophen (TYLENOL) 325 MG tablet Take 2 tablets (650 mg total) by mouth every 6 (six) hours as needed. (Patient not taking: Reported on 12/03/2016) 30 tablet 0  . ergocalciferol (DRISDOL) 50000 units capsule Take 1 capsule (50,000 Units total) by mouth once a week. (Patient not taking: Reported on 12/03/2016) 9 capsule 0  . guaiFENesin (MUCINEX) 600 MG 12 hr tablet Take 1 tablet (600 mg total) by mouth 2 (two) times daily. (Patient not taking: Reported on 12/03/2016) 20 tablet 0  . ibuprofen (ADVIL,MOTRIN) 600 MG tablet Take 1 tablet (600 mg total) by mouth every 6 (six) hours as needed. (Patient not taking: Reported on 12/03/2016) 30 tablet 0  . levocetirizine (XYZAL) 5 MG tablet Take 1 tablet (5 mg total) by mouth every evening. (Patient not taking: Reported on 01/29/2017) 30 tablet 2  . naproxen (NAPROSYN) 500 MG tablet Take 1 tablet (500 mg total) by mouth 2 (two) times daily with a meal. (Patient not taking: Reported on 01/29/2017) 30 tablet 0  . Olopatadine HCl (PATADAY) 0.2 % SOLN Apply 1 drop to eye 2 (two) times daily. (Patient not  taking: Reported on 01/29/2017) 2.5 mL 1  . omeprazole (PRILOSEC) 20 MG capsule Take 20 mg by mouth daily.     No facility-administered medications prior to visit.     ROS Review of Systems  Constitutional: Negative for activity change, appetite change and fatigue.  HENT: Negative for congestion, sinus pressure and sore throat.   Eyes: Negative for visual disturbance.  Respiratory: Negative for cough, chest tightness, shortness of breath and wheezing.   Cardiovascular: Negative for chest pain and palpitations.  Gastrointestinal: Negative for abdominal distention, abdominal pain and constipation.    Endocrine: Negative for polydipsia.  Genitourinary: Negative for dysuria and frequency.  Musculoskeletal: Negative for arthralgias and back pain.       See hpi  Skin: Negative for rash.  Neurological: Negative for tremors, light-headedness and numbness.  Hematological: Does not bruise/bleed easily.  Psychiatric/Behavioral: Negative for agitation and behavioral problems.    Objective:  BP 137/84   Pulse 77   Temp (!) 97.5 F (36.4 C) (Oral)   Ht 5\' 3"  (1.6 m)   Wt 298 lb 6.4 oz (135.4 kg)   SpO2 99%   BMI 52.86 kg/m   BP/Weight 01/29/2017 01/27/2017 12/03/2016  Systolic BP 137 119 123  Diastolic BP 84 59 84  Wt. (Lbs) 298.4 - 297  BMI 52.86 - 52.61     Physical Exam  Constitutional: She is oriented to person, place, and time. She appears well-developed and well-nourished.  Cardiovascular: Normal rate, normal heart sounds and intact distal pulses.   No murmur heard. Pulmonary/Chest: Effort normal and breath sounds normal. She has no wheezes. She has no rales. She exhibits no tenderness.  Abdominal: Soft. Bowel sounds are normal. She exhibits no distension and no mass. There is no tenderness.  Musculoskeletal: She exhibits edema (edema lateral aspect of right knee) and tenderness (tenderness on palpation of lateral joint line and on range of motion).  Neurological: She is alert and oriented to person, place, and time.  Skin: Skin is warm and dry.  Psychiatric: She has a normal mood and affect.     Assessment & Plan:   1. Primary osteoarthritis of right knee Status post cortisone shots in the past Will need to exclude effusion Continue NSAIDs once prednisone dose is completed - DG Knee Complete 4 Views Right; Future - predniSONE (DELTASONE) 20 MG tablet; Take 1 tablet (20 mg total) by mouth daily with breakfast.  Dispense: 5 tablet; Refill: 0  2. Chronic tension-type headache, not intractable Will need to exclude obstructive sleep apnea as she does have some  insomnia Also has a history of chronic sinusitis; could be sinus headache as well Currently has no medical coverage and has been advised to apply for [facility] [employer] discount to facilitate referral for a sleep study - butalbital-acetaminophen-caffeine (FIORICET, ESGIC) 50-325-40 MG tablet; Take 1-2 tablets by mouth every 12 (twelve) hours as needed for headache.  Dispense: 30 tablet; Refill: 0  3. Essential hypertension Controlled   Meds ordered this encounter  Medications  . butalbital-acetaminophen-caffeine (FIORICET, ESGIC) 50-325-40 MG tablet    Sig: Take 1-2 tablets by mouth every 12 (twelve) hours as needed for headache.    Dispense:  30 tablet    Refill:  0  . predniSONE (DELTASONE) 20 MG tablet    Sig: Take 1 tablet (20 mg total) by mouth daily with breakfast.    Dispense:  5 tablet    Refill:  0    Follow-up: Return in about 6 weeks (around 03/12/2017) for Follow-up  of knee pain and headaches.   Jaclyn Shaggy MD

## 2017-02-01 ENCOUNTER — Other Ambulatory Visit: Payer: Self-pay | Admitting: Family Medicine

## 2017-02-04 ENCOUNTER — Ambulatory Visit (HOSPITAL_COMMUNITY)
Admission: RE | Admit: 2017-02-04 | Discharge: 2017-02-04 | Disposition: A | Discharge status: Home / Self Care | Payer: Medicaid Other | Source: Ambulatory Visit | Attending: Family Medicine | Admitting: Family Medicine

## 2017-02-04 ENCOUNTER — Telehealth: Payer: Self-pay | Admitting: Internal Medicine

## 2017-02-04 DIAGNOSIS — M1711 Unilateral primary osteoarthritis, right knee: Secondary | ICD-10-CM | POA: Insufficient documentation

## 2017-02-04 DIAGNOSIS — M25461 Effusion, right knee: Secondary | ICD-10-CM | POA: Insufficient documentation

## 2017-02-04 NOTE — Telephone Encounter (Signed)
Pt came to the office to get her right knee and leg x-ray results, her PCP is out of the office for a couple of weeks. Can you please review the results with the patient and call the patient she is very concerned.

## 2017-02-05 ENCOUNTER — Other Ambulatory Visit: Payer: Self-pay | Admitting: Internal Medicine

## 2017-02-05 DIAGNOSIS — M25461 Effusion, right knee: Secondary | ICD-10-CM

## 2017-02-05 NOTE — Telephone Encounter (Signed)
Pt was called and a VM was left informing pt of x-ray results and referral being placed.

## 2017-02-05 NOTE — Telephone Encounter (Signed)
Please inform this patient that her knee X Ray showed moderate fluid in the joint, and progressing osteoarthritis. Will refer to Orthopedic Surgery for further evaluation and management

## 2017-02-10 ENCOUNTER — Encounter (INDEPENDENT_AMBULATORY_CARE_PROVIDER_SITE_OTHER): Payer: Self-pay | Admitting: Orthopaedic Surgery

## 2017-02-10 ENCOUNTER — Ambulatory Visit (INDEPENDENT_AMBULATORY_CARE_PROVIDER_SITE_OTHER): Payer: Self-pay | Admitting: Orthopaedic Surgery

## 2017-02-10 DIAGNOSIS — M1711 Unilateral primary osteoarthritis, right knee: Secondary | ICD-10-CM

## 2017-02-10 DIAGNOSIS — S83241A Other tear of medial meniscus, current injury, right knee, initial encounter: Secondary | ICD-10-CM

## 2017-02-10 MED ORDER — BUPIVACAINE HCL 0.5 % IJ SOLN
2.0000 mL | INTRAMUSCULAR | Status: AC | PRN
  Administered 2017-02-10: 2 mL via INTRA_ARTICULAR

## 2017-02-10 MED ORDER — METHYLPREDNISOLONE ACETATE 40 MG/ML IJ SUSP
40.0000 mg | INTRAMUSCULAR | Status: AC | PRN
  Administered 2017-02-10: 40 mg via INTRA_ARTICULAR

## 2017-02-10 MED ORDER — LIDOCAINE HCL 1 % IJ SOLN
2.0000 mL | INTRAMUSCULAR | Status: AC | PRN
  Administered 2017-02-10: 2 mL

## 2017-02-10 NOTE — Progress Notes (Signed)
Office Visit Note   Patient: Crystal Burns           Date of Birth: [birth year]           MRN: [id number] Visit Date: 02/10/2017              Requested by: Jaclyn ShaggyAmao, Enobong, MD [address] [place], [place] [id number] PCP: Jaclyn ShaggyAmao, Enobong, MD   Assessment & Plan: Visit Diagnoses:  1. Primary osteoarthritis of right knee   2. Acute medial meniscus tear of right knee, initial encounter     Plan: Impression is right knee degenerative joint disease and effusion.  Patient is also morbidly obese.  She understands that she does need to lose weight.  Patient has had a chronic joint effusion that has failed conservative treatment with her primary care doctor.  We ordered an MRI to evaluate for medial meniscal tear.  Follow-Up Instructions: Return if symptoms worsen or fail to improve.   Orders:  Orders Placed This Encounter  Procedures  . MR Knee Right w/o contrast  . Cell count + diff,  w/ cryst-synvl fld   No orders of the defined types were placed in this encounter.     Procedures: Large Joint Inj: R knee on 02/10/2017 7:19 PM Indications: pain Details: 22 G needle  Arthrogram: No  Medications: 40 mg methylPREDNISolone acetate 40 MG/ML; 2 mL lidocaine 1 %; 2 mL bupivacaine 0.5 % Aspirate: 45 mL clear; sent for lab analysis Outcome: tolerated well, no immediate complications Consent was given by the patient. Patient was prepped and draped in the usual sterile fashion.       Clinical Data: No additional findings.   Subjective: Chief Complaint  Patient presents with  . Right Knee - Pain    Patient is a 36 year old female comes in with right knee pain and swelling for about 2 years.  This is recently gotten worse.  The pain is worse with prolonged activity and standing and flexion of the knee.  She has difficulty with certain movements.  She also complains of posterior knee pain.  Denies any mechanical symptoms but does endorse giving way.    Review of Systems    Constitutional: Negative.   HENT: Negative.   Eyes: Negative.   Respiratory: Negative.   Cardiovascular: Negative.   Endocrine: Negative.   Musculoskeletal: Negative.   Neurological: Negative.   Hematological: Negative.   Psychiatric/Behavioral: Negative.   All other systems reviewed and are negative.    Objective: Vital Signs: There were no vitals taken for this visit.  Physical Exam  Constitutional: She is oriented to person, place, and time. She appears well-developed and well-nourished.  HENT:  Head: Normocephalic and atraumatic.  Eyes: EOM are normal.  Neck: Neck supple.  Pulmonary/Chest: Effort normal.  Abdominal: Soft.  Neurological: She is alert and oriented to person, place, and time.  Skin: Skin is warm. Capillary refill takes less than 2 seconds.  Psychiatric: She has a normal mood and affect. Her behavior is normal. Judgment and thought content normal.  Nursing note and vitals reviewed.   Ortho Exam Right knee exam shows a large joint effusion.  Collaterals and cruciates are stable.  No other findings.  Medial joint line tenderness Specialty Comments:  No specialty comments available.  Imaging: No results found.   PMFS History: Patient Active Problem List   Diagnosis Date Noted  . Acute medial meniscus tear of right knee 02/10/2017  . Chronic sinusitis 07/23/2016  . Vitamin D deficiency 07/10/2015  .  Anxiety state 06/20/2015  . Costochondritis 05/18/2015  . Primary osteoarthritis of right knee 04/22/2014  . Essential hypertension 07/18/2012  . LUMBOSACRAL STRAIN, ACUTE 07/05/2008  . Morbid obesity (HCC) 02/24/2008  . Asthma with exacerbation 02/24/2008   Past Medical History:  Diagnosis Date  . Asthma    2010-only bothers pt during bad cold  . Atypical chest pain 01/16/2016   Atypical chest pain  . Depression    2010-had when mother passed away  . History of gonorrhea   . Hypertension    2004  . Obesity   . Preeclampsia complicating  hypertension 01/21/2015    Family History  Problem Relation Age of Onset  . Hypertension Mother   . Diabetes Mother   . Cancer Mother   . Hypertension Father   . Diabetes Father   . Hyperlipidemia Father   . Arthritis Unknown   . Asthma Unknown   . Breast cancer Unknown   . Heart failure Unknown   . Congenital heart disease Unknown   . Depression Unknown   . Heart attack Unknown     Past Surgical History:  Procedure Laterality Date  . CHOLECYSTECTOMY    . DILATION AND CURETTAGE OF UTERUS    . WISDOM TOOTH EXTRACTION     Social History   Occupational History  . Not on file  Tobacco Use  . Smoking status: Former Smoker    Types: Cigarettes    Last attempt to quit: 10/31/2010    Years since quitting: 6.2  . Smokeless tobacco: Never Used  Substance and Sexual Activity  . Alcohol use: No    Alcohol/week: 0.0 oz  . Drug use: No  . Sexual activity: Not on file

## 2017-02-11 LAB — SYNOVIAL CELL COUNT + DIFF, W/ CRYSTALS
BASOPHILS, %: 0 %
EOSINOPHILS-SYNOVIAL: 0 % (ref 0–2)
LYMPHOCYTES-SYNOVIAL FLD: 22 % (ref 0–74)
Monocyte/Macrophage: 70 % — ABNORMAL HIGH (ref 0–69)
NEUTROPHIL, SYNOVIAL: 7 % (ref 0–24)
SYNOVIOCYTES, %: 1 % (ref 0–15)
WBC, SYNOVIAL: 187 {cells}/uL — AB (ref <150)

## 2017-02-11 LAB — TIQ-NTM

## 2017-02-13 ENCOUNTER — Ambulatory Visit (HOSPITAL_COMMUNITY)
Admission: EM | Admit: 2017-02-13 | Discharge: 2017-02-13 | Disposition: A | Discharge status: Home / Self Care | Payer: Medicaid Other | Attending: Family Medicine | Admitting: Family Medicine

## 2017-02-13 ENCOUNTER — Encounter (HOSPITAL_COMMUNITY): Payer: Self-pay | Admitting: Emergency Medicine

## 2017-02-13 DIAGNOSIS — R519 Headache, unspecified: Secondary | ICD-10-CM

## 2017-02-13 DIAGNOSIS — R51 Headache: Secondary | ICD-10-CM

## 2017-02-13 DIAGNOSIS — R2241 Localized swelling, mass and lump, right lower limb: Secondary | ICD-10-CM

## 2017-02-13 MED ORDER — TRAMADOL HCL 50 MG PO TABS: 50.0000 mg | ORAL_TABLET | Freq: Four times a day (QID) | ORAL | 0 refills | Status: DC | PRN

## 2017-02-13 MED ORDER — ENOXAPARIN SODIUM 40 MG/0.4ML ~~LOC~~ SOLN
40.0000 mg | SUBCUTANEOUS | Status: DC
  Administered 2017-02-13: 40 mg via SUBCUTANEOUS
  Filled 2017-02-13: qty 0.4

## 2017-02-13 MED FILL — LOSARTAN POTASSIUM 25 MG TA: 25 | 30 days supply | Qty: 30 | Fill #2

## 2017-02-13 MED FILL — HYDROCHLOROTHIAZIDE 25 MG T: 25 | 30 days supply | Qty: 30 | Fill #7

## 2017-02-13 NOTE — ED Triage Notes (Signed)
PT reports headache for a few days.   PT had fluid drained from right knee Monday. PT noticed swelling in right lower leg today. This is new. PT has pitting edema in RLE

## 2017-02-13 NOTE — ED Provider Notes (Signed)
[facility]   [id number] 02/13/17 Arrival Time: 1842   SUBJECTIVE:  Crystal Burns is a 36 y.o. female who presents to the urgent care with complaint of headache for a few days. Headache is frontal  PT had fluid drained from right knee Monday. PT noticed swelling in right lower leg today. This is new. PT has pitting edema in RLE. Swelling goes down by morning.  No definite calf pain   Patient has a sit down job.  [place].    Past Medical History:  Diagnosis Date  . Asthma    2010-only bothers pt during bad cold  . Atypical chest pain 01/16/2016   Atypical chest pain  . Depression    2010-had when mother passed away  . History of gonorrhea   . Hypertension    2004  . Obesity   . Preeclampsia complicating hypertension 01/21/2015   Family History  Problem Relation Age of Onset  . Hypertension Mother   . Diabetes Mother   . Cancer Mother   . Hypertension Father   . Diabetes Father   . Hyperlipidemia Father   . Arthritis Unknown   . Asthma Unknown   . Breast cancer Unknown   . Heart failure Unknown   . Congenital heart disease Unknown   . Depression Unknown   . Heart attack Unknown    Social History   Socioeconomic History  . Marital status: Divorced    Spouse name: Not on file  . Number of children: Not on file  . Years of education: Not on file  . Highest education level: Not on file  Social Needs  . Financial resource strain: Not on file  . Food insecurity - worry: Not on file  . Food insecurity - inability: Not on file  . Transportation needs - medical: Not on file  . Transportation needs - non-medical: Not on file  Occupational History  . Not on file  Tobacco Use  . Smoking status: Former Smoker    Types: Cigarettes    Last attempt to quit: 10/31/2010    Years since quitting: 6.2  . Smokeless tobacco: Never Used  Substance and Sexual Activity  . Alcohol use: No    Alcohol/week: 0.0 oz  . Drug use: No  . Sexual activity: Not on  file  Other Topics Concern  . Not on file  Social History Narrative  . Not on file   Current Meds  Medication Sig  . CAMILA 0.35 MG tablet TAKE 1 TABLET BY MOUTH EVERY DAY  . HYDROCHLOROTHIAZIDE PO Take by mouth.  . losartan (COZAAR) 25 MG tablet TAKE 1 TABLET BY MOUTH DAILY.  Marland Kitchen. omeprazole (PRILOSEC) 20 MG capsule Take 20 mg by mouth daily.   Allergies  Allergen Reactions  . Bee Venom Hives and Swelling  . Lisinopril Shortness Of Breath and Cough  . Shellfish Allergy Anaphylaxis      ROS: As per HPI, remainder of ROS negative.   OBJECTIVE:   Vitals:   02/13/17 1913 02/13/17 1916  BP:  129/73  Pulse:  66  Resp:  16  Temp:  98.7 F (37.1 C)  TempSrc:  Oral  SpO2:  100%  Weight: 290 lb (131.5 kg)   Height: 5\' 3"  (1.6 m)      General appearance: alert; no distress Eyes: PERRL; EOMI; conjunctiva normal HENT: normocephalic; atraumatic;external ears normal without trauma; nasal mucosa normal; oral mucosa normal Neck: supple Lungs: clear to auscultation bilaterally Heart: regular rate and rhythm Back: no CVA  tenderness Extremities: no cyanosis; symmetrical with no gross deformities;  Mild lower right calf pitting edema. Skin: warm and dry Neurologic: normal gait; grossly normal Psychological: alert and cooperative; normal mood and affect      Labs:  Results for orders placed or performed in visit on 02/10/17  Cell count + diff,  w/ cryst-synvl fld  Result Value Ref Range   Site NOT GIVEN    Color, Synovial YELLOW STRAW/YELL   Appearance-Synovial CLEAR CLEAR/HAZY   WBC, Synovial 187 (H) <150 cells/uL   Neutrophil, Synovial 7 0 - 24 %   Lymphocytes-Synovial Fld 22 0 - 74 %   Monocyte/Macrophage 70 (H) 0 - 69 %   Eosinophils-Synovial 0 0 - 2 %   Basophils, % 0 0 %   Synoviocytes, % 1 0 - 15 %   Crystals  NONE SEEN /HPF   Mucin Clot  GOOD  TIQ-NTM  Result Value Ref Range   QUESTION/PROBLEM:     SPECIMEN(S) RECEIVED: RED TOP FLUID IS EXTRA     Labs  Reviewed - No data to display  No results found.     ASSESSMENT & PLAN:  1. Bad headache     Meds ordered this encounter  Medications  . enoxaparin (LOVENOX) injection 40 mg  . traMADol (ULTRAM) 50 MG tablet    Sig: Take 1 tablet (50 mg total) every 6 (six) hours as needed by mouth.    Dispense:  15 tablet    Refill:  0    Reviewed expectations re: course of current medical issues. Questions answered. Outlined signs and symptoms indicating need for more acute intervention. Patient verbalized understanding. After Visit Summary given.        Elvina SidleLauenstein, Tiron Suski, MD 02/13/17 2000

## 2017-02-13 NOTE — ED Notes (Signed)
Called pharmacy for medication, notified trevette, patient access rep to go get medication

## 2017-02-13 NOTE — Discharge Instructions (Signed)
Please call your doctor tomorrow to let him know about the leg swelling.

## 2017-02-14 ENCOUNTER — Telehealth: Payer: Self-pay | Admitting: Family Medicine

## 2017-02-14 NOTE — Telephone Encounter (Signed)
Called pt. To schedule an appt. With Crystal Burns for a Urgent care f/u for her knee. LVM to return call and schedule appt.

## 2017-02-24 ENCOUNTER — Ambulatory Visit
Admission: RE | Admit: 2017-02-24 | Discharge: 2017-02-24 | Disposition: A | Discharge status: Home / Self Care | Payer: Medicaid Other | Source: Ambulatory Visit | Attending: Orthopaedic Surgery | Admitting: Orthopaedic Surgery

## 2017-02-24 DIAGNOSIS — M1711 Unilateral primary osteoarthritis, right knee: Secondary | ICD-10-CM

## 2017-02-26 ENCOUNTER — Ambulatory Visit: Payer: Self-pay | Attending: Family Medicine | Admitting: Physician Assistant

## 2017-02-26 ENCOUNTER — Other Ambulatory Visit: Payer: Self-pay

## 2017-02-26 VITALS — BP 120/79 | HR 78 | Temp 98.0°F | Resp 16 | Wt 291.0 lb

## 2017-02-26 DIAGNOSIS — J45909 Unspecified asthma, uncomplicated: Secondary | ICD-10-CM | POA: Insufficient documentation

## 2017-02-26 DIAGNOSIS — I10 Essential (primary) hypertension: Secondary | ICD-10-CM | POA: Insufficient documentation

## 2017-02-26 DIAGNOSIS — Z6841 Body Mass Index (BMI) 40.0 and over, adult: Secondary | ICD-10-CM | POA: Insufficient documentation

## 2017-02-26 DIAGNOSIS — F329 Major depressive disorder, single episode, unspecified: Secondary | ICD-10-CM | POA: Insufficient documentation

## 2017-02-26 DIAGNOSIS — M7989 Other specified soft tissue disorders: Secondary | ICD-10-CM | POA: Insufficient documentation

## 2017-02-26 DIAGNOSIS — E669 Obesity, unspecified: Secondary | ICD-10-CM | POA: Insufficient documentation

## 2017-02-26 DIAGNOSIS — Z79899 Other long term (current) drug therapy: Secondary | ICD-10-CM | POA: Insufficient documentation

## 2017-02-26 NOTE — Progress Notes (Signed)
Patient ID: Crystal Burns, female   DOB: 01/16/1981, 36 y.o.   MRN: [id number]     Crystal Burns, is a 36 y.o. female  [id number]  [id number]  DOB - 01/16/1981  Subjective:  Chief Complaint and HPI: Crystal KelpLaquetha Camps is a 36 y.o. female here today for a follow up visit After being seen at the Urgent Care 02/13/2017 for frontal HA and RLE swelling s/p having joint aspiration  And steroid injection R knee 02/10/2017 with ortho .    She was treated with tramadol and given Lovenox and told to f/up here to get a Doppler scheduled.  The swelling in her leg/calf has improved. Her knee continues to give her trouble and she had an MRI 02/24/2017 but hasn't gotten the results yet.  [facility] and I do not have access to the results.   ED/Hospital notes reviewed.     ROS:   Constitutional:  No f/c, No night sweats, No unexplained weight loss. EENT:  No vision changes, No blurry vision, No hearing changes. No mouth, throat, or ear problems.  Respiratory: No cough, No SOB Cardiac: No CP, no palpitations GI:  No abd pain, No N/V/D. GU: No Urinary s/sx Musculoskeletal: + R knee pain Neuro: No headache, no dizziness, no motor weakness.  Skin: No rash Endocrine:  No polydipsia. No polyuria.  Psych: Denies SI/HI  No problems updated.  ALLERGIES: Allergies  Allergen Reactions  . Bee Venom Hives and Swelling  . Lisinopril Shortness Of Breath and Cough  . Shellfish Allergy Anaphylaxis    PAST MEDICAL HISTORY: Past Medical History:  Diagnosis Date  . Asthma    2010-only bothers pt during bad cold  . Atypical chest pain 01/16/2016   Atypical chest pain  . Depression    2010-had when mother passed away  . History of gonorrhea   . Hypertension    2004  . Obesity   . Preeclampsia complicating hypertension 01/21/2015    MEDICATIONS AT HOME: Prior to Admission medications   Medication Sig Start Date End Date Taking? Authorizing Provider  acetaminophen  (TYLENOL) 325 MG tablet Take 2 tablets (650 mg total) by mouth every 6 (six) hours as needed. 05/27/16  Yes Derwood KaplanNanavati, Ankit, MD  butalbital-acetaminophen-caffeine (FIORICET, ESGIC) 50-325-40 MG tablet Take 1-2 tablets by mouth every 12 (twelve) hours as needed for headache. 01/29/17 01/29/18 Yes Amao, Enobong, MD  CAMILA 0.35 MG tablet TAKE 1 TABLET BY MOUTH EVERY DAY 02/03/17  Yes Jegede, Olugbemiga E, MD  fluticasone (FLONASE) 50 MCG/ACT nasal spray Place 2 sprays into both nostrils daily. 07/16/16  Yes Jaclyn ShaggyAmao, Enobong, MD  HYDROCHLOROTHIAZIDE PO Take by mouth.   Yes [provider]  ibuprofen (ADVIL,MOTRIN) 600 MG tablet Take 1 tablet (600 mg total) by mouth every 6 (six) hours as needed. 05/27/16  Yes Derwood KaplanNanavati, Ankit, MD  levocetirizine (XYZAL) 5 MG tablet Take 1 tablet (5 mg total) by mouth every evening. 12/03/16  Yes Amao, Enobong, MD  losartan (COZAAR) 25 MG tablet TAKE 1 TABLET BY MOUTH DAILY. 12/13/16  Yes Jaclyn ShaggyAmao, Enobong, MD  Olopatadine HCl (PATADAY) 0.2 % SOLN Apply 1 drop to eye 2 (two) times daily. 12/03/16  Yes Jaclyn ShaggyAmao, Enobong, MD  omeprazole (PRILOSEC) 20 MG capsule Take 20 mg by mouth daily.   Yes [provider]  naproxen (NAPROSYN) 500 MG tablet Take 1 tablet (500 mg total) by mouth 2 (two) times daily with a meal. Patient not taking: Reported on 02/26/2017 01/27/17   Georgetta HaberBurky, Natalie B, NP  predniSONE (DELTASONE) 20 MG tablet Take 1 tablet (20 mg total) by mouth daily with breakfast. Patient not taking: Reported on 02/26/2017 01/29/17   Jaclyn ShaggyAmao, Enobong, MD  traMADol (ULTRAM) 50 MG tablet Take 1 tablet (50 mg total) every 6 (six) hours as needed by mouth. Patient not taking: Reported on 02/26/2017 02/13/17   Elvina SidleLauenstein, Kurt, MD     Objective:  EXAM:   Vitals:   02/26/17 1050  BP: 120/79  Pulse: 78  Resp: 16  Temp: 98 F (36.7 C)  TempSrc: Oral  SpO2: 99%  Weight: 291 lb (132 kg)    General appearance : A&OX3. NAD. Non-toxic-appearing HEENT: Atraumatic and  Normocephalic.  PERRLA. EOM intact.  Neck: supple, no JVD. No cervical lymphadenopathy. No thyromegaly Chest/Lungs:  Breathing-non-labored, Good air entry bilaterally, breath sounds normal without rales, rhonchi, or wheezing  CVS: S1 S2 regular, no murmurs, gallops, rubs  Extremities: Bilateral Lower Ext shows no edema, both legs are warm to touch with = pulse throughout.  R calf without erythema or swelling.  Neg Homan's.  Neurology:  CN II-XII grossly intact, Non focal.   Psych:  TP linear. J/I WNL. Normal speech. Appropriate eye contact and affect.  Skin:  No Rash  Data Review Lab Results  Component Value Date   HGBA1C 5.60 05/18/2015   HGBA1C  01/30/2008    6.1 (NOTE)   The ADA recommends the following therapeutic goal for glycemic   control related to Hgb A1C measurement:   Goal of Therapy:   < 7.0% Hgb A1C   Reference: American Diabetes Association: Clinical Practice   Recommendations 2008, Diabetes Care,  2008, 31:(Suppl 1).     Assessment & Plan   1. Right leg swelling R/o DVT-not high suspicion for DVT.  She can take a baby aspirin daily until the results are back.  To ED if anything worsens.   - VAS US LOWER EXTREMITY VENOUS (DVT); Future   Patient have been counseled extensively about nutrition and exercise  Return in about 2 weeks (around 03/11/2017) for keep 03/11/2017 appt with Dr Newlin/Amao.  The patient was given clear instructions to go to ER or return to medical center if symptoms don't improve, worsen or new problems develop. The patient verbalized understanding. The patient was told to call to get lab results if they haven't heard anything in the next week.     Georgian CoAngela Alana Dayton, PA-C [facility] [place], [place] [phone]   02/26/2017, 11:21 AM

## 2017-02-26 NOTE — Progress Notes (Signed)
Pt c/o right knee pain. Pain 7/10 today. More pain with ambulating and movement.  Seen ortho and had MRI on Monday. Concerns with having a DVT

## 2017-03-04 ENCOUNTER — Ambulatory Visit (HOSPITAL_COMMUNITY)
Admission: RE | Admit: 2017-03-04 | Discharge: 2017-03-04 | Disposition: A | Discharge status: Home / Self Care | Payer: Self-pay | Source: Ambulatory Visit | Attending: Physician Assistant | Admitting: Physician Assistant

## 2017-03-04 ENCOUNTER — Ambulatory Visit (INDEPENDENT_AMBULATORY_CARE_PROVIDER_SITE_OTHER): Payer: Self-pay | Admitting: Orthopaedic Surgery

## 2017-03-04 ENCOUNTER — Telehealth: Payer: Self-pay | Admitting: Family Medicine

## 2017-03-04 DIAGNOSIS — M7989 Other specified soft tissue disorders: Secondary | ICD-10-CM | POA: Insufficient documentation

## 2017-03-04 DIAGNOSIS — E669 Obesity, unspecified: Secondary | ICD-10-CM | POA: Insufficient documentation

## 2017-03-04 NOTE — Telephone Encounter (Signed)
Call report from [place] from Vascular lab take for RLE venous study: Report is Negative

## 2017-03-04 NOTE — Progress Notes (Signed)
Right lower extremity venous duplex completed. No obvious evidence of a DVT, superficial thrombosis, or Baker's cyst.  Toma DeitersVirginia Neelah Mannings, RVS 11>27.2018 , 12.03 PM

## 2017-03-04 NOTE — Telephone Encounter (Signed)
[place] from [facility] called to give report regarding pt.

## 2017-03-10 NOTE — Telephone Encounter (Signed)
Left message on voicemail for patient to return call.   Notes recorded by Anders SimmondsMcClung, Angela M, PA-C on 03/05/2017 at 9:23 AM EST Please call patient. There was no evidence of blood clot. Follow-up as planned. Thanks, Georgian CoAngela McClung, PA-C

## 2017-03-11 ENCOUNTER — Ambulatory Visit: Payer: Medicaid Other | Admitting: Family Medicine

## 2017-03-11 ENCOUNTER — Other Ambulatory Visit: Payer: Self-pay | Admitting: Internal Medicine

## 2017-03-11 ENCOUNTER — Ambulatory Visit (INDEPENDENT_AMBULATORY_CARE_PROVIDER_SITE_OTHER): Payer: Medicaid Other | Admitting: Orthopaedic Surgery

## 2017-03-11 ENCOUNTER — Encounter (INDEPENDENT_AMBULATORY_CARE_PROVIDER_SITE_OTHER): Payer: Self-pay | Admitting: Orthopaedic Surgery

## 2017-03-11 DIAGNOSIS — S83241A Other tear of medial meniscus, current injury, right knee, initial encounter: Secondary | ICD-10-CM

## 2017-03-11 DIAGNOSIS — M1711 Unilateral primary osteoarthritis, right knee: Secondary | ICD-10-CM | POA: Diagnosis not present

## 2017-03-11 MED ORDER — DICLOFENAC SODIUM 75 MG PO TBEC
75.0000 mg | DELAYED_RELEASE_TABLET | Freq: Two times a day (BID) | ORAL | 2 refills | Status: DC
Start: 2017-03-11 — End: 2017-08-07

## 2017-03-11 MED ORDER — DICLOFENAC SODIUM 1 % TD GEL: 2.0000 g | Freq: Four times a day (QID) | TRANSDERMAL | 5 refills | Status: DC

## 2017-03-11 MED FILL — HYDROCHLOROTHIAZIDE 25 MG T: 25 | 30 days supply | Qty: 30 | Fill #8

## 2017-03-11 MED FILL — ?DICLOFENAC SOD DR 75 MG TA: 75 | 15 days supply | Qty: 30 | Fill #0

## 2017-03-11 MED FILL — DICLOFENAC SODIUM 1% GEL: 1 | 12 days supply | Qty: 100 | Fill #0

## 2017-03-11 NOTE — Progress Notes (Signed)
Office Visit Note   Patient: Crystal Burns           Date of Birth: 06-12-1980           MRN: [id number] Visit Date: 03/11/2017              Requested by: Jaclyn ShaggyAmao, Enobong, MD [address] [place], [place] [id number] PCP: Jaclyn ShaggyAmao, Enobong, MD   Assessment & Plan: Visit Diagnoses:  1. Primary osteoarthritis of right knee   2. Acute medial meniscus tear of right knee, initial encounter     Plan: MRI findings are consistent with bone-on-bone changes of the medial compartment with extruded medial meniscal tear.  The medial meniscus is nonfunctional.  Very slight bony edema.  These findings were reviewed with the patient.  At this point I urged her to lose weight to help with her pain and degenerative joint disease.  We can consider periodic injections if needed.  Prescription for oral and topical diclofenac.  Referral to chronic pain management clinic.  Patient has already tried and failed tramadol. Total face to face encounter time was greater than 25 minutes and over half of this time was spent in counseling and/or coordination of care.  Follow-Up Instructions: Return if symptoms worsen or fail to improve.   Orders:  No orders of the defined types were placed in this encounter.  Meds ordered this encounter  Medications  . diclofenac (VOLTAREN) 75 MG EC tablet    Sig: Take 1 tablet (75 mg total) by mouth 2 (two) times daily.    Dispense:  30 tablet    Refill:  2  . diclofenac sodium (VOLTAREN) 1 % GEL    Sig: Apply 2 g topically 4 (four) times daily.    Dispense:  1 Tube    Refill:  5      Procedures: No procedures performed   Clinical Data: No additional findings.   Subjective: Chief Complaint  Patient presents with  . Right Knee - Follow-up    Patient follows up today to review her MRI.  She recently had a Doppler negative for DVT.  She continues to have right knee pain and swelling.    Review of Systems   Objective: Vital Signs: There were no vitals  taken for this visit.  Physical Exam  Ortho Exam Right knee exam is stable. Specialty Comments:  No specialty comments available.  Imaging: No results found.   PMFS History: Patient Active Problem List   Diagnosis Date Noted  . Acute medial meniscus tear of right knee 02/10/2017  . Chronic sinusitis 07/23/2016  . Vitamin D deficiency 07/10/2015  . Anxiety state 06/20/2015  . Costochondritis 05/18/2015  . Primary osteoarthritis of right knee 04/22/2014  . Essential hypertension 07/18/2012  . LUMBOSACRAL STRAIN, ACUTE 07/05/2008  . Morbid obesity (HCC) 02/24/2008  . Asthma with exacerbation 02/24/2008   Past Medical History:  Diagnosis Date  . Asthma    2010-only bothers pt during bad cold  . Atypical chest pain 01/16/2016   Atypical chest pain  . Depression    2010-had when mother passed away  . History of gonorrhea   . Hypertension    2004  . Obesity   . Preeclampsia complicating hypertension 01/21/2015    Family History  Problem Relation Age of Onset  . Hypertension Mother   . Diabetes Mother   . Cancer Mother   . Hypertension Father   . Diabetes Father   . Hyperlipidemia Father   . Arthritis Unknown   .  Asthma Unknown   . Breast cancer Unknown   . Heart failure Unknown   . Congenital heart disease Unknown   . Depression Unknown   . Heart attack Unknown     Past Surgical History:  Procedure Laterality Date  . CHOLECYSTECTOMY    . DILATION AND CURETTAGE OF UTERUS    . WISDOM TOOTH EXTRACTION     Social History   Occupational History  . Not on file  Tobacco Use  . Smoking status: Former Smoker    Types: Cigarettes    Last attempt to quit: 10/31/2010    Years since quitting: 6.3  . Smokeless tobacco: Never Used  Substance and Sexual Activity  . Alcohol use: No    Alcohol/week: 0.0 oz  . Drug use: No  . Sexual activity: Not on file

## 2017-03-13 ENCOUNTER — Telehealth: Payer: Self-pay | Admitting: Family Medicine

## 2017-03-13 ENCOUNTER — Other Ambulatory Visit: Payer: Self-pay | Admitting: Family Medicine

## 2017-03-13 DIAGNOSIS — I10 Essential (primary) hypertension: Secondary | ICD-10-CM

## 2017-03-13 MED FILL — LOSARTAN POTASSIUM 25 MG TA: 25 | 30 days supply | Qty: 30 | Fill #0

## 2017-03-13 NOTE — Telephone Encounter (Signed)
Pt called to refill

## 2017-03-14 ENCOUNTER — Other Ambulatory Visit: Payer: Self-pay | Admitting: Family Medicine

## 2017-03-19 ENCOUNTER — Encounter: Payer: Self-pay | Admitting: *Deleted

## 2017-03-19 ENCOUNTER — Telehealth: Payer: Self-pay | Admitting: Family Medicine

## 2017-03-19 NOTE — Telephone Encounter (Signed)
This is a patient who is very particular about her medications.  Review of her chart indicate I never prescribed this for her but Dr Hyman HopesJegede prescribed a one month supply. I would need to know who initiated this and that the patient is in agreement as she has complained of side effects of several medications in the past.

## 2017-03-19 NOTE — Telephone Encounter (Signed)
Patient called asking for birth control refill . Please fu

## 2017-03-19 NOTE — Telephone Encounter (Signed)
Pt request medication called to [facility].

## 2017-03-20 NOTE — Telephone Encounter (Signed)
Called patient and let her know that she needs to be seen in clinic for a refill per Dr. Venetia NightAmao. She verbalized understanding and declined to be transferred to the front to schedule an appt and said that she would call later to make an appt.

## 2017-03-28 ENCOUNTER — Ambulatory Visit: Payer: Medicaid Other | Admitting: Family Medicine

## 2017-04-09 ENCOUNTER — Ambulatory Visit: Payer: Self-pay | Attending: Family Medicine | Admitting: Physician Assistant

## 2017-04-09 VITALS — BP 137/85 | HR 68 | Temp 97.4°F | Resp 16 | Ht 63.0 in | Wt 300.0 lb

## 2017-04-09 DIAGNOSIS — Z3002 Counseling and instruction in natural family planning to avoid pregnancy: Secondary | ICD-10-CM | POA: Insufficient documentation

## 2017-04-09 DIAGNOSIS — Z79899 Other long term (current) drug therapy: Secondary | ICD-10-CM | POA: Insufficient documentation

## 2017-04-09 DIAGNOSIS — Z7952 Long term (current) use of systemic steroids: Secondary | ICD-10-CM | POA: Insufficient documentation

## 2017-04-09 DIAGNOSIS — Z91013 Allergy to seafood: Secondary | ICD-10-CM | POA: Insufficient documentation

## 2017-04-09 DIAGNOSIS — Z3041 Encounter for surveillance of contraceptive pills: Secondary | ICD-10-CM

## 2017-04-09 DIAGNOSIS — M25561 Pain in right knee: Secondary | ICD-10-CM | POA: Insufficient documentation

## 2017-04-09 DIAGNOSIS — I10 Essential (primary) hypertension: Secondary | ICD-10-CM | POA: Insufficient documentation

## 2017-04-09 DIAGNOSIS — F329 Major depressive disorder, single episode, unspecified: Secondary | ICD-10-CM | POA: Insufficient documentation

## 2017-04-09 DIAGNOSIS — J45909 Unspecified asthma, uncomplicated: Secondary | ICD-10-CM | POA: Insufficient documentation

## 2017-04-09 DIAGNOSIS — E669 Obesity, unspecified: Secondary | ICD-10-CM | POA: Insufficient documentation

## 2017-04-09 DIAGNOSIS — J069 Acute upper respiratory infection, unspecified: Secondary | ICD-10-CM | POA: Insufficient documentation

## 2017-04-09 LAB — POCT URINE PREGNANCY: Preg Test, Ur: NEGATIVE

## 2017-04-09 MED ORDER — MEDROXYPROGESTERONE ACETATE 150 MG/ML IM SUSP
150.0000 mg | Freq: Once | INTRAMUSCULAR | Status: AC
  Administered 2017-04-09: 150 mg via INTRAMUSCULAR

## 2017-04-09 NOTE — Progress Notes (Signed)
Crystal Burns, is a 37 y.o. female  WGN:[id number]:[id number]  HQI:[id number]  DOB - 1980-09-16  Subjective:  Chief Complaint and HPI: Crystal Burns is a 37 y.o. female here today for multiple issues.  She is having ortho assess her knee pain.  They think she has a meniscus issue.    Also, she needs help with BCP. She has gotten pregnant twice with IUD.  Wants to be on the pill but has a lot of risk factors(htn, obesity, >35yo).  No other form of BC has worked.  She thought Depo Provera caused HA but hasn't noticed significant difference since stopping Depo.  She is receptive to taking this again.    She also has a 3 day h/o congestion, R ear popping.  +occasional sneezing.  No f/c.  No cough.    ROS:   Constitutional:  No f/c, No night sweats, No unexplained weight loss. EENT:  No vision changes, No blurry vision, No hearing changes. No other mouth, throat, or ear problems.  Respiratory: No cough, No SOB Cardiac: No CP, no palpitations GI:  No abd pain, No N/V/D. GU: No Urinary s/sx Musculoskeletal: +R knee pain Neuro: No headache, no dizziness, no motor weakness.  Skin: No rash Endocrine:  No polydipsia. No polyuria.  Psych: Denies SI/HI  No problems updated.  ALLERGIES: Allergies  Allergen Reactions  . Bee Venom Hives and Swelling  . Lisinopril Shortness Of Breath and Cough  . Shellfish Allergy Anaphylaxis    PAST MEDICAL HISTORY: Past Medical History:  Diagnosis Date  . Asthma    2010-only bothers pt during bad cold  . Atypical chest pain 01/16/2016   Atypical chest pain  . Depression    2010-had when mother passed away  . History of gonorrhea   . Hypertension    2004  . Obesity   . Preeclampsia complicating hypertension 01/21/2015    MEDICATIONS AT HOME: Prior to Admission medications   Medication Sig Start Date End Date Taking? Authorizing Provider  acetaminophen (TYLENOL) 325 MG tablet Take 2 tablets (650 mg total) by mouth every 6 (six) hours as needed.  05/27/16   Derwood KaplanNanavati, Ankit, MD  butalbital-acetaminophen-caffeine (FIORICET, ESGIC) 50-325-40 MG tablet Take 1-2 tablets by mouth every 12 (twelve) hours as needed for headache. 01/29/17 01/29/18  Jaclyn ShaggyAmao, Enobong, MD  diclofenac (VOLTAREN) 75 MG EC tablet Take 1 tablet (75 mg total) by mouth 2 (two) times daily. 03/11/17   Tarry KosXu, Naiping M, MD  diclofenac sodium (VOLTAREN) 1 % GEL Apply 2 g topically 4 (four) times daily. 03/11/17   Tarry KosXu, Naiping M, MD  fluticasone (FLONASE) 50 MCG/ACT nasal spray Place 2 sprays into both nostrils daily. 07/16/16   Jaclyn ShaggyAmao, Enobong, MD  HYDROCHLOROTHIAZIDE PO Take by mouth.    [provider]  ibuprofen (ADVIL,MOTRIN) 600 MG tablet Take 1 tablet (600 mg total) by mouth every 6 (six) hours as needed. 05/27/16   Derwood KaplanNanavati, Ankit, MD  levocetirizine (XYZAL) 5 MG tablet Take 1 tablet (5 mg total) by mouth every evening. 12/03/16   Jaclyn ShaggyAmao, Enobong, MD  losartan (COZAAR) 25 MG tablet TAKE 1 TABLET BY MOUTH DAILY. 03/13/17   Jaclyn ShaggyAmao, Enobong, MD  naproxen (NAPROSYN) 500 MG tablet Take 1 tablet (500 mg total) by mouth 2 (two) times daily with a meal. 01/27/17   Burky, Dorene GrebeNatalie B, NP  Olopatadine HCl (PATADAY) 0.2 % SOLN Apply 1 drop to eye 2 (two) times daily. 12/03/16   Jaclyn ShaggyAmao, Enobong, MD  omeprazole (PRILOSEC) 20 MG capsule Take 20 mg by  mouth daily.    [provider]  predniSONE (DELTASONE) 20 MG tablet Take 1 tablet (20 mg total) by mouth daily with breakfast. 01/29/17   Jaclyn Shaggy, MD  traMADol (ULTRAM) 50 MG tablet Take 1 tablet (50 mg total) every 6 (six) hours as needed by mouth. 02/13/17   Elvina Sidle, MD     Objective:  EXAM:   Vitals:   04/09/17 1031  BP: 137/85  Pulse: 68  Resp: 16  Temp: (!) 97.4 F (36.3 C)  TempSrc: Oral  SpO2: 98%  Weight: 300 lb (136.1 kg)  Height: 5\' 3"  (1.6 m)    General appearance : A&OX3. NAD. Non-toxic-appearing HEENT: Atraumatic and Normocephalic.  PERRLA. EOM intact.  TM full B. Mouth-MMM, post pharynx WNL w/o  erythema, + PND. Neck: supple, no JVD. No cervical lymphadenopathy. No thyromegaly Chest/Lungs:  Breathing-non-labored, Good air entry bilaterally, breath sounds normal without rales, rhonchi, or wheezing  CVS: S1 S2 regular, no murmurs, gallops, rubs  Extremities: Bilateral Lower Ext shows no edema, both legs are warm to touch with = pulse throughout Neurology:  CN II-XII grossly intact, Non focal.   Psych:  TP linear. J/I WNL. Normal speech. Appropriate eye contact and affect.  Skin:  No Rash  Data Review Lab Results  Component Value Date   HGBA1C 5.60 05/18/2015   HGBA1C  01/30/2008    6.1 (NOTE)   The ADA recommends the following therapeutic goal for glycemic   control related to Hgb A1C measurement:   Goal of Therapy:   < 7.0% Hgb A1C   Reference: American Diabetes Association: Clinical Practice   Recommendations 2008, Diabetes Care,  2008, 31:(Suppl 1).     Assessment & Plan   1. Counseling for birth control, natural family planning Not pregnant.  OCP not a good option.  She has done fairly well on Depo - medroxyPROGESTERone (DEPO-PROVERA) injection 150 mg  2. Oral contraceptive use Stop OCP - POCT urine pregnancy  3. Viral upper respiratory tract infection Fluids, rest, respiratory care.  OTC meds for symptoms is fine  4. Hypertension, unspecified type Adequate control.  Continue current regimen, DASH diet.  We have discussed target BP range and blood pressure goal. I have advised patient to check BP regularly and to call us back or report to clinic if the numbers are consistently higher than 140/90. We discussed the importance of compliance with medical therapy and DASH diet recommended, consequences of uncontrolled hypertension discussed.    Patient have been counseled extensively about nutrition and exercise  Return in about 3 months (around 07/08/2017) for depo Provera injection and 3 month follow up Dr Alvis Lemmings.  The patient was given clear instructions to go to ER  or return to medical center if symptoms don't improve, worsen or new problems develop. The patient verbalized understanding. The patient was told to call to get lab results if they haven't heard anything in the next week.     Georgian Co, PA-C [facility] and [facility] [place], [place] [phone]   04/09/2017, 11:11 AMPatient ID: Crystal Burns, female   DOB: September 14, 1980, 37 y.o.   MRN: [id number]

## 2017-04-09 NOTE — Progress Notes (Signed)
Needs RF on birth control. Out of OCP x 1 month  Sinus concerns, congestion, ears popping Right knee pain

## 2017-04-14 ENCOUNTER — Other Ambulatory Visit: Payer: Self-pay | Admitting: Family Medicine

## 2017-04-14 ENCOUNTER — Other Ambulatory Visit: Payer: Self-pay

## 2017-04-14 DIAGNOSIS — I10 Essential (primary) hypertension: Secondary | ICD-10-CM

## 2017-04-14 MED ORDER — LOSARTAN POTASSIUM 25 MG PO TABS: 25.0000 mg | ORAL_TABLET | Freq: Every day | ORAL | 3 refills | Status: DC

## 2017-04-14 MED FILL — HYDROCHLOROTHIAZIDE 25 MG T: 25 | 30 days supply | Qty: 30 | Fill #9

## 2017-04-14 MED FILL — LOSARTAN POTASSIUM 25 MG TA: 25 | 30 days supply | Qty: 30 | Fill #0

## 2017-04-18 ENCOUNTER — Ambulatory Visit: Payer: Medicaid Other

## 2017-05-07 ENCOUNTER — Ambulatory Visit: Payer: Self-pay | Attending: Nurse Practitioner | Admitting: Nurse Practitioner

## 2017-05-07 ENCOUNTER — Encounter: Payer: Self-pay | Admitting: Nurse Practitioner

## 2017-05-07 VITALS — BP 128/83 | HR 81 | Temp 97.7°F | Resp 12 | Ht 63.0 in | Wt 302.2 lb

## 2017-05-07 DIAGNOSIS — Z888 Allergy status to other drugs, medicaments and biological substances status: Secondary | ICD-10-CM | POA: Insufficient documentation

## 2017-05-07 DIAGNOSIS — Z79899 Other long term (current) drug therapy: Secondary | ICD-10-CM | POA: Insufficient documentation

## 2017-05-07 DIAGNOSIS — Z833 Family history of diabetes mellitus: Secondary | ICD-10-CM | POA: Insufficient documentation

## 2017-05-07 DIAGNOSIS — Z7951 Long term (current) use of inhaled steroids: Secondary | ICD-10-CM | POA: Insufficient documentation

## 2017-05-07 DIAGNOSIS — Z9049 Acquired absence of other specified parts of digestive tract: Secondary | ICD-10-CM | POA: Insufficient documentation

## 2017-05-07 DIAGNOSIS — E559 Vitamin D deficiency, unspecified: Secondary | ICD-10-CM | POA: Insufficient documentation

## 2017-05-07 DIAGNOSIS — I10 Essential (primary) hypertension: Secondary | ICD-10-CM | POA: Insufficient documentation

## 2017-05-07 DIAGNOSIS — Z91030 Bee allergy status: Secondary | ICD-10-CM | POA: Insufficient documentation

## 2017-05-07 DIAGNOSIS — J321 Chronic frontal sinusitis: Secondary | ICD-10-CM | POA: Insufficient documentation

## 2017-05-07 DIAGNOSIS — Z91013 Allergy to seafood: Secondary | ICD-10-CM | POA: Insufficient documentation

## 2017-05-07 DIAGNOSIS — H538 Other visual disturbances: Secondary | ICD-10-CM | POA: Insufficient documentation

## 2017-05-07 DIAGNOSIS — Z6841 Body Mass Index (BMI) 40.0 and over, adult: Secondary | ICD-10-CM | POA: Insufficient documentation

## 2017-05-07 DIAGNOSIS — Z8249 Family history of ischemic heart disease and other diseases of the circulatory system: Secondary | ICD-10-CM | POA: Insufficient documentation

## 2017-05-07 MED ORDER — AMOXICILLIN-POT CLAVULANATE 875-125 MG PO TABS: 1.0000 | ORAL_TABLET | Freq: Two times a day (BID) | ORAL | 0 refills | Status: DC

## 2017-05-07 MED ORDER — AMOXICILLIN-POT CLAVULANATE 875-125 MG PO TABS: 1.0000 | ORAL_TABLET | Freq: Two times a day (BID) | ORAL | 0 refills | Status: AC

## 2017-05-07 MED FILL — AMOX-CLAV 875-125 MG TABLET: 875-125 | 5 days supply | Qty: 10 | Fill #0

## 2017-05-07 NOTE — Progress Notes (Signed)
Crystal Burns was seen today for blurred vision.  Diagnoses and all orders for this visit:  Frontal sinusitis, unspecified chronicity -     amoxicillin-clavulanate (AUGMENTIN) 875-125 MG tablet; Take 1 tablet by mouth 2 (two) times daily for 5 days. INSTRUCTIONS: Make sure you continue to take your allergy medications as prescribed: including flonase, xyzal and pataday   Vitamin D deficiency -     VITAMIN D 25 Hydroxy (Vit-D Deficiency, Fractures)  Morbid obesity (HCC) Discussed diet and exercise for person with BMI >25. Instructed: You must burn more calories than you eat. Losing 5 percent of your body weight should be considered a success. In the longer term, losing more than 15 percent of your body weight and staying at this weight is an extremely good result. However, keep in mind that even losing 5 percent of your body weight leads to important health benefits, so try not to get discouraged if you're not able to lose more than this.     Frontal sinusitis, unspecified chronicity  Vitamin D deficiency -     VITAMIN D 25 Hydroxy (Vit-D Deficiency, Fractures)   Morbid Obesity Discussed diet and exercise for person with BMI >25. Instructed: You must burn more calories than you eat. Losing 5 percent of your body weight should be considered a success. In the longer term, losing more than 15 percent of your body weight and staying at this weight is an extremely good result. However, keep in mind that even losing 5 percent of your body weight leads to important health benefits, so try not to get discouraged if you're not able to lose more than this.    Patient has been counseled on age-appropriate routine health concerns for screening and prevention. These are reviewed and up-to-date. Referrals have been placed accordingly. Immunizations are up-to-date or declined.    Subjective:   Chief Complaint  Patient presents with  . Blurred Vision    Patient stated she have headaches, blurry  vision, and eyes crust when she wakes up in the morning. Patient stated she may be getting a cold and is unsure. Patient stated there are pressure on her eyes.    HPI Crystal Burns 37 y.o. female presents to office today with complaints of URI symptoms.  Sinusitis Patient presents with symptoms of sinusitis. She endorses symptoms of generalized malaise and fatigue along with cough, headache, nasal congestion with yellow/green drainage, sinus pressure and watery drainage from her eyes. Coughing up thick mucous in the mornings. Onset of symptoms was almost 2 weeks ago and have worsened over the past 3 days.  There has not been a history of epistaxis, posttussive emesis, spitting/vomiting mucous. There has not been a history of chronic otitis media or pharyngotonsillitis.  Prior antibiotic therapy has included no recent courses. Other medications have included Flonase, xyzal and pataday however she does not take these medications consistently for her allergic rhinitis.      Vitamin D Deficiency She has a history of vitamin D deficiency with Vitamin D deficiency. Last Vitamin D level 13. Will need to recheck level as she has not been taking OTC vitamin D  Review of Systems  Constitutional: Positive for chills and malaise/fatigue. Negative for fever.  HENT: Positive for congestion, sinus pain and sore throat. Negative for ear discharge, ear pain and hearing loss.   Eyes: Positive for discharge and redness. Negative for blurred vision, double vision, photophobia and pain.  Respiratory: Positive for cough and shortness of breath. Negative for sputum production and  wheezing.   Cardiovascular: Negative.  Negative for chest pain, orthopnea and leg swelling.  Gastrointestinal: Negative.  Negative for abdominal pain, diarrhea, nausea and vomiting.  Neurological: Positive for headaches. Negative for dizziness and focal weakness.  Endo/Heme/Allergies: Positive for environmental allergies.    Psychiatric/Behavioral: Negative.     Past Medical History:  Diagnosis Date  . Asthma    2010-only bothers pt during bad cold  . Atypical chest pain 01/16/2016   Atypical chest pain  . Depression    2010-had when mother passed away  . History of gonorrhea   . Hypertension    2004  . Obesity   . Preeclampsia complicating hypertension 01/21/2015    Past Surgical History:  Procedure Laterality Date  . CHOLECYSTECTOMY    . DILATION AND CURETTAGE OF UTERUS    . WISDOM TOOTH EXTRACTION      Family History  Problem Relation Age of Onset  . Hypertension Mother   . Diabetes Mother   . Cancer Mother   . Hypertension Father   . Diabetes Father   . Hyperlipidemia Father   . Arthritis Unknown   . Asthma Unknown   . Breast cancer Unknown   . Heart failure Unknown   . Congenital heart disease Unknown   . Depression Unknown   . Heart attack Unknown     Social History Reviewed with no changes to be made today.   Outpatient Medications Prior to Visit  Medication Sig Dispense Refill  . fluticasone (FLONASE) 50 MCG/ACT nasal spray Place 2 sprays into both nostrils daily. 16 g 3  . HYDROCHLOROTHIAZIDE PO Take by mouth.    . losartan (COZAAR) 25 MG tablet Take 1 tablet (25 mg total) by mouth daily. 30 tablet 3  . acetaminophen (TYLENOL) 325 MG tablet Take 2 tablets (650 mg total) by mouth every 6 (six) hours as needed. (Patient not taking: Reported on 05/07/2017) 30 tablet 0  . butalbital-acetaminophen-caffeine (FIORICET, ESGIC) 50-325-40 MG tablet Take 1-2 tablets by mouth every 12 (twelve) hours as needed for headache. (Patient not taking: Reported on 05/07/2017) 30 tablet 0  . diclofenac (VOLTAREN) 75 MG EC tablet Take 1 tablet (75 mg total) by mouth 2 (two) times daily. (Patient not taking: Reported on 05/07/2017) 30 tablet 2  . diclofenac sodium (VOLTAREN) 1 % GEL Apply 2 g topically 4 (four) times daily. (Patient not taking: Reported on 05/07/2017) 1 Tube 5  . ibuprofen  (ADVIL,MOTRIN) 600 MG tablet Take 1 tablet (600 mg total) by mouth every 6 (six) hours as needed. (Patient not taking: Reported on 05/07/2017) 30 tablet 0  . levocetirizine (XYZAL) 5 MG tablet Take 1 tablet (5 mg total) by mouth every evening. (Patient not taking: Reported on 05/07/2017) 30 tablet 2  . naproxen (NAPROSYN) 500 MG tablet Take 1 tablet (500 mg total) by mouth 2 (two) times daily with a meal. (Patient not taking: Reported on 05/07/2017) 30 tablet 0  . Olopatadine HCl (PATADAY) 0.2 % SOLN Apply 1 drop to eye 2 (two) times daily. (Patient not taking: Reported on 05/07/2017) 2.5 mL 1  . omeprazole (PRILOSEC) 20 MG capsule Take 20 mg by mouth daily.    . predniSONE (DELTASONE) 20 MG tablet Take 1 tablet (20 mg total) by mouth daily with breakfast. (Patient not taking: Reported on 05/07/2017) 5 tablet 0  . traMADol (ULTRAM) 50 MG tablet Take 1 tablet (50 mg total) every 6 (six) hours as needed by mouth. (Patient not taking: Reported on 05/07/2017) 15 tablet 0  No facility-administered medications prior to visit.     Allergies  Allergen Reactions  . Bee Venom Hives and Swelling  . Lisinopril Shortness Of Breath and Cough  . Shellfish Allergy Anaphylaxis       Objective:    BP 128/83 (BP Location: Left Arm, Patient Position: Sitting, Cuff Size: Normal)   Pulse 81   Temp 97.7 F (36.5 C) (Oral)   Ht 5\' 3"  (1.6 m)   Wt (!) 302 lb 3.2 oz (137.1 kg)   SpO2 100%   BMI 53.53 kg/m  Wt Readings from Last 3 Encounters:  05/07/17 (!) 302 lb 3.2 oz (137.1 kg)  04/09/17 300 lb (136.1 kg)  02/26/17 291 lb (132 kg)    Physical Exam  Constitutional: She is oriented to person, place, and time. She appears well-developed and well-nourished. She appears ill.  HENT:  Head: Normocephalic.  Right Ear: Hearing, external ear and ear canal normal. No middle ear effusion.  Left Ear: Hearing, external ear and ear canal normal.  No middle ear effusion.  Nose: Mucosal edema and rhinorrhea present.  Right sinus exhibits maxillary sinus tenderness and frontal sinus tenderness. Left sinus exhibits maxillary sinus tenderness and frontal sinus tenderness.  Mouth/Throat: Mucous membranes are normal. Posterior oropharyngeal erythema present. No oropharyngeal exudate, posterior oropharyngeal edema or tonsillar abscesses.  Significant nasal mucosal edema with visible purulent drainage in nasal vault.  Eyes: EOM are normal.  Neck: Normal range of motion. No thyromegaly present.  Cardiovascular: Normal rate and regular rhythm. Exam reveals no gallop and no friction rub.  No murmur heard. Pulmonary/Chest: Effort normal and breath sounds normal. No respiratory distress. She has no wheezes. She has no rales. She exhibits no tenderness.  Abdominal: Soft. Bowel sounds are normal.  Musculoskeletal: Normal range of motion.  Lymphadenopathy:    She has no cervical adenopathy.  Neurological: She is alert and oriented to person, place, and time.  Skin: Skin is warm and dry.  Psychiatric: She has a normal mood and affect. Her behavior is normal. Judgment and thought content normal.         Patient has been counseled extensively about nutrition and exercise as well as the importance of adherence with medications and regular follow-up. The patient was given clear instructions to go to ER or return to medical center if symptoms don't improve, worsen or new problems develop. The patient verbalized understanding.   Follow-up: Return if symptoms worsen or fail to improve.   Claiborne Rigg, FNP-BC [facility] and [facility] [place], [place] [phone]   05/08/2017, 6:04 PM

## 2017-05-07 NOTE — Patient Instructions (Addendum)

## 2017-05-08 ENCOUNTER — Encounter: Payer: Self-pay | Admitting: Nurse Practitioner

## 2017-05-08 LAB — VITAMIN D 25 HYDROXY (VIT D DEFICIENCY, FRACTURES): Vit D, 25-Hydroxy: 14.2 ng/mL — ABNORMAL LOW (ref 30.0–100.0)

## 2017-05-08 MED ORDER — VITAMIN D (ERGOCALCIFEROL) 1.25 MG (50000 UNIT) PO CAPS
50000.0000 [IU] | ORAL_CAPSULE | ORAL | 1 refills | Status: DC
Start: 2017-05-08 — End: 2018-01-17

## 2017-05-14 MED FILL — LOSARTAN POTASSIUM 25 MG TA: 25 | 30 days supply | Qty: 30 | Fill #1

## 2017-05-26 MED FILL — HYDROCHLOROTHIAZIDE 25 MG T: 25 | 30 days supply | Qty: 30 | Fill #10

## 2017-05-27 MED FILL — VIT D2 1.25 MG (50,000 UNIT: 1.25 MG | 28 days supply | Qty: 4 | Fill #0

## 2017-06-05 ENCOUNTER — Ambulatory Visit: Payer: Self-pay | Attending: Family Medicine | Admitting: Physician Assistant

## 2017-06-05 VITALS — BP 121/80 | HR 76 | Temp 98.4°F | Resp 16 | Ht 63.0 in | Wt 297.4 lb

## 2017-06-05 DIAGNOSIS — G44209 Tension-type headache, unspecified, not intractable: Secondary | ICD-10-CM

## 2017-06-05 DIAGNOSIS — Z8619 Personal history of other infectious and parasitic diseases: Secondary | ICD-10-CM | POA: Insufficient documentation

## 2017-06-05 DIAGNOSIS — I10 Essential (primary) hypertension: Secondary | ICD-10-CM

## 2017-06-05 DIAGNOSIS — Z91030 Bee allergy status: Secondary | ICD-10-CM | POA: Insufficient documentation

## 2017-06-05 DIAGNOSIS — Z888 Allergy status to other drugs, medicaments and biological substances status: Secondary | ICD-10-CM | POA: Insufficient documentation

## 2017-06-05 DIAGNOSIS — Z20828 Contact with and (suspected) exposure to other viral communicable diseases: Secondary | ICD-10-CM

## 2017-06-05 DIAGNOSIS — J45909 Unspecified asthma, uncomplicated: Secondary | ICD-10-CM | POA: Insufficient documentation

## 2017-06-05 DIAGNOSIS — Z79899 Other long term (current) drug therapy: Secondary | ICD-10-CM | POA: Insufficient documentation

## 2017-06-05 DIAGNOSIS — R0981 Nasal congestion: Secondary | ICD-10-CM

## 2017-06-05 DIAGNOSIS — E669 Obesity, unspecified: Secondary | ICD-10-CM | POA: Insufficient documentation

## 2017-06-05 DIAGNOSIS — Z91013 Allergy to seafood: Secondary | ICD-10-CM | POA: Insufficient documentation

## 2017-06-05 MED ORDER — OSELTAMIVIR PHOSPHATE 75 MG PO CAPS: 75.0000 mg | ORAL_CAPSULE | Freq: Every day | ORAL | 0 refills | Status: DC

## 2017-06-05 MED ORDER — FLUTICASONE PROPIONATE 50 MCG/ACT NA SUSP: 2.0000 | Freq: Every day | NASAL | 3 refills | Status: DC

## 2017-06-05 MED FILL — OSELTAMIVIR PHOSPHATE 75 MG: 75 | 10 days supply | Qty: 10 | Fill #0

## 2017-06-05 NOTE — Patient Instructions (Addendum)
Check blood pressure out of office 3-5 times/weeka dn record and bring to next visit.  Schedule visit sooner if your BP is running consistently higher than 120/80   Hypertension Hypertension, commonly called high blood pressure, is when the force of blood pumping through the arteries is too strong. The arteries are the blood vessels that carry blood from the heart throughout the body. Hypertension forces the heart to work harder to pump blood and may cause arteries to become narrow or stiff. Having untreated or uncontrolled hypertension can cause heart attacks, strokes, kidney disease, and other problems. A blood pressure reading consists of a higher number over a lower number. Ideally, your blood pressure should be below 120/80. The first ("top") number is called the systolic pressure. It is a measure of the pressure in your arteries as your heart beats. The second ("bottom") number is called the diastolic pressure. It is a measure of the pressure in your arteries as the heart relaxes. What are the causes? The cause of this condition is not known. What increases the risk? Some risk factors for high blood pressure are under your control. Others are not. Factors you can change  Smoking.  Having type 2 diabetes mellitus, high cholesterol, or both.  Not getting enough exercise or physical activity.  Being overweight.  Having too much fat, sugar, calories, or salt (sodium) in your diet.  Drinking too much alcohol. Factors that are difficult or impossible to change  Having chronic kidney disease.  Having a family history of high blood pressure.  Age. Risk increases with age.  Race. You may be at higher risk if you are African-American.  Gender. Men are at higher risk than women before age 37. After age 37, women are at higher risk than men.  Having obstructive sleep apnea.  Stress. What are the signs or symptoms? Extremely high blood pressure (hypertensive crisis) may  cause:  Headache.  Anxiety.  Shortness of breath.  Nosebleed.  Nausea and vomiting.  Severe chest pain.  Jerky movements you cannot control (seizures).  How is this diagnosed? This condition is diagnosed by measuring your blood pressure while you are seated, with your arm resting on a surface. The cuff of the blood pressure monitor will be placed directly against the skin of your upper arm at the level of your heart. It should be measured at least twice using the same arm. Certain conditions can cause a difference in blood pressure between your right and left arms. Certain factors can cause blood pressure readings to be lower or higher than normal (elevated) for a short period of time:  When your blood pressure is higher when you are in a health care provider's office than when you are at home, this is called white coat hypertension. Most people with this condition do not need medicines.  When your blood pressure is higher at home than when you are in a health care provider's office, this is called masked hypertension. Most people with this condition may need medicines to control blood pressure.  If you have a high blood pressure reading during one visit or you have normal blood pressure with other risk factors:  You may be asked to return on a different day to have your blood pressure checked again.  You may be asked to monitor your blood pressure at home for 1 week or longer.  If you are diagnosed with hypertension, you may have other blood or imaging tests to help your health care provider understand your overall  risk for other conditions. How is this treated? This condition is treated by making healthy lifestyle changes, such as eating healthy foods, exercising more, and reducing your alcohol intake. Your health care provider may prescribe medicine if lifestyle changes are not enough to get your blood pressure under control, and if:  Your systolic blood pressure is above  130.  Your diastolic blood pressure is above 80.  Your personal target blood pressure may vary depending on your medical conditions, your age, and other factors. Follow these instructions at home: Eating and drinking  Eat a diet that is high in fiber and potassium, and low in sodium, added sugar, and fat. An example eating plan is called the DASH (Dietary Approaches to Stop Hypertension) diet. To eat this way: ? Eat plenty of fresh fruits and vegetables. Try to fill half of your plate at each meal with fruits and vegetables. ? Eat whole grains, such as whole wheat pasta, [place] rice, or whole grain bread. Fill about one quarter of your plate with whole grains. ? Eat or drink low-fat dairy products, such as skim milk or low-fat yogurt. ? Avoid fatty cuts of meat, processed or cured meats, and poultry with skin. Fill about one quarter of your plate with lean proteins, such as fish, chicken without skin, beans, eggs, and tofu. ? Avoid premade and processed foods. These tend to be higher in sodium, added sugar, and fat.  Reduce your daily sodium intake. Most people with hypertension should eat less than 1,500 mg of sodium a day.  Limit alcohol intake to no more than 1 drink a day for nonpregnant women and 2 drinks a day for men. One drink equals 12 oz of beer, 5 oz of wine, or 1 oz of hard liquor. Lifestyle  Work with your health care provider to maintain a healthy body weight or to lose weight. Ask what an ideal weight is for you.  Get at least 30 minutes of exercise that causes your heart to beat faster (aerobic exercise) most days of the week. Activities may include walking, swimming, or biking.  Include exercise to strengthen your muscles (resistance exercise), such as pilates or lifting weights, as part of your weekly exercise routine. Try to do these types of exercises for 30 minutes at least 3 days a week.  Do not use any products that contain nicotine or tobacco, such as cigarettes and  e-cigarettes. If you need help quitting, ask your health care provider.  Monitor your blood pressure at home as told by your health care provider.  Keep all follow-up visits as told by your health care provider. This is important. Medicines  Take over-the-counter and prescription medicines only as told by your health care provider. Follow directions carefully. Blood pressure medicines must be taken as prescribed.  Do not skip doses of blood pressure medicine. Doing this puts you at risk for problems and can make the medicine less effective.  Ask your health care provider about side effects or reactions to medicines that you should watch for. Contact a health care provider if:  You think you are having a reaction to a medicine you are taking.  You have headaches that keep coming back (recurring).  You feel dizzy.  You have swelling in your ankles.  You have trouble with your vision. Get help right away if:  You develop a severe headache or confusion.  You have unusual weakness or numbness.  You feel faint.  You have severe pain in your chest or abdomen.  You vomit repeatedly.  You have trouble breathing. Summary  Hypertension is when the force of blood pumping through your arteries is too strong. If this condition is not controlled, it may put you at risk for serious complications.  Your personal target blood pressure may vary depending on your medical conditions, your age, and other factors. For most people, a normal blood pressure is less than 120/80.  Hypertension is treated with lifestyle changes, medicines, or a combination of both. Lifestyle changes include weight loss, eating a healthy, low-sodium diet, exercising more, and limiting alcohol. This information is not intended to replace advice given to you by your health care provider. Make sure you discuss any questions you have with your health care provider. Document Released: 03/25/2005 Document Revised: 02/21/2016  Document Reviewed: 02/21/2016 Elsevier Interactive Patient Education  [employer].

## 2017-06-05 NOTE — Progress Notes (Signed)
Patient is here for headaches that has been going on for 3 days.   No chest pain.  Patient stated she have a tooth pain.

## 2017-06-05 NOTE — Progress Notes (Signed)
Patient ID: Crystal Burns, female   DOB: 1981-03-10, 37 y.o.   MRN: [id number]     Crystal Burns, is a 37 y.o. female  [id number]  [id number]  DOB - Oct 23, 1980  Subjective:  Chief Complaint and HPI: Crystal Burns is a 37 y.o. female here today with HA X 3 days, feels a little light headed, sneezing, congested, runny nose, and general achiness.  All 6 of her children have been sick within the last 2 weeks.  No N/V/D.  No vision changes.  2 kids with +influenza.    Social factors:  Daughter recently robbed at [employer].  Declined meeting with Jenel Lucks.    ROS:   Constitutional:  No f/c, No night sweats, No unexplained weight loss. EENT:  No vision changes, No blurry vision, No hearing changes. No additional mouth, throat, or ear problems.  Respiratory: No cough, No SOB Cardiac: No CP, no palpitations GI:  No abd pain, No N/V/D. GU: No Urinary s/sx Musculoskeletal: No joint pain Neuro: + headache, no dizziness, no motor weakness.  Skin: No rash Endocrine:  No polydipsia. No polyuria.  Psych: Denies SI/HI  No problems updated.  ALLERGIES: Allergies  Allergen Reactions  . Bee Venom Hives and Swelling  . Lisinopril Shortness Of Breath and Cough  . Shellfish Allergy Anaphylaxis    PAST MEDICAL HISTORY: Past Medical History:  Diagnosis Date  . Asthma    2010-only bothers pt during bad cold  . Atypical chest pain 01/16/2016   Atypical chest pain  . Depression    2010-had when mother passed away  . History of gonorrhea   . Hypertension    2004  . Obesity   . Preeclampsia complicating hypertension 01/21/2015    MEDICATIONS AT HOME: Prior to Admission medications   Medication Sig Start Date End Date Taking? Authorizing Provider  fluticasone (FLONASE) 50 MCG/ACT nasal spray Place 2 sprays into both nostrils daily. 06/05/17  Yes Anders Simmonds, PA-C  HYDROCHLOROTHIAZIDE PO Take by mouth.   Yes [provider]  losartan (COZAAR) 25 MG  tablet Take 1 tablet (25 mg total) by mouth daily. 04/14/17  Yes Hoy Register, MD  omeprazole (PRILOSEC) 20 MG capsule Take 20 mg by mouth daily.   Yes [provider]  Vitamin D, Ergocalciferol, (DRISDOL) 50000 units CAPS capsule Take 1 capsule (50,000 Units total) by mouth every 7 (seven) days. 05/08/17  Yes Claiborne Rigg, NP  acetaminophen (TYLENOL) 325 MG tablet Take 2 tablets (650 mg total) by mouth every 6 (six) hours as needed. Patient not taking: Reported on 05/07/2017 05/27/16   Derwood Kaplan, MD  butalbital-acetaminophen-caffeine (FIORICET, ESGIC) [phone] MG tablet Take 1-2 tablets by mouth every 12 (twelve) hours as needed for headache. Patient not taking: Reported on 05/07/2017 01/29/17 01/29/18  Hoy Register, MD  diclofenac (VOLTAREN) 75 MG EC tablet Take 1 tablet (75 mg total) by mouth 2 (two) times daily. Patient not taking: Reported on 05/07/2017 03/11/17   Tarry Kos, MD  diclofenac sodium (VOLTAREN) 1 % GEL Apply 2 g topically 4 (four) times daily. Patient not taking: Reported on 05/07/2017 03/11/17   Tarry Kos, MD  ibuprofen (ADVIL,MOTRIN) 600 MG tablet Take 1 tablet (600 mg total) by mouth every 6 (six) hours as needed. Patient not taking: Reported on 05/07/2017 05/27/16   Derwood Kaplan, MD  levocetirizine (XYZAL) 5 MG tablet Take 1 tablet (5 mg total) by mouth every evening. Patient not taking: Reported on 05/07/2017 12/03/16   Hoy Register, MD  naproxen (NAPROSYN) 500 MG tablet Take 1 tablet (500 mg total) by mouth 2 (two) times daily with a meal. Patient not taking: Reported on 05/07/2017 01/27/17   Linus MakoBurky, Natalie B, NP  Olopatadine HCl (PATADAY) 0.2 % SOLN Apply 1 drop to eye 2 (two) times daily. Patient not taking: Reported on 05/07/2017 12/03/16   Hoy RegisterNewlin, Enobong, MD  oseltamivir (TAMIFLU) 75 MG capsule Take 1 capsule (75 mg total) by mouth daily. 06/05/17   Anders SimmondsMcClung, Angela M, PA-C     Objective:  EXAM:   Vitals:   06/05/17 1020  BP: 121/80    Pulse: 76  Resp: 16  Temp: 98.4 F (36.9 C)  TempSrc: Oral  SpO2: 97%  Weight: 297 lb 6.4 oz (134.9 kg)  Height: 5\' 3"  (1.6 m)    General appearance : A&OX3. NAD. Non-toxic-appearing HEENT: Atraumatic and Normocephalic.  PERRLA. EOM intact.  TM clear B. Mouth-MMM, post pharynx WNL w/o erythema, No PND. Neck: supple, no JVD. No cervical lymphadenopathy. No thyromegaly Chest/Lungs:  Breathing-non-labored, Good air entry bilaterally, breath sounds normal without rales, rhonchi, or wheezing  CVS: S1 S2 regular, no murmurs, gallops, rubs  Abdomen: Bowel sounds present, Non tender and not distended with no gaurding, rigidity or rebound. Extremities: Bilateral Lower Ext shows no edema, both legs are warm to touch with = pulse throughout Neurology:  CN II-XII grossly intact, Non focal.   Psych:  TP linear. J/I WNL. Normal speech. Appropriate eye contact and affect.  Skin:  No Rash  Data Review Lab Results  Component Value Date   HGBA1C 5.60 05/18/2015   HGBA1C  01/30/2008    6.1 (NOTE)   The ADA recommends the following therapeutic goal for glycemic   control related to Hgb A1C measurement:   Goal of Therapy:   < 7.0% Hgb A1C   Reference: American Diabetes Association: Clinical Practice   Recommendations 2008, Diabetes Care,  2008, 31:(Suppl 1).     Assessment & Plan   1. Tension-type headache, not intractable, unspecified chronicity pattern No red flags Likely viral syndrome with congestion.  Ibuprofen or tylenol seems to be helping, so it is fine to continue these per pkg instrcutions  2. Sinus congestion - fluticasone (FLONASE) 50 MCG/ACT nasal spray; Place 2 sprays into both nostrils daily.  Dispense: 16 g; Refill: 3  3. Exposure to the flu - oseltamivir (TAMIFLU) 75 MG capsule; Take 1 capsule (75 mg total) by mouth daily.  Dispense: 10 capsule; Refill: 0  4. Essential hypertension Uncontrolled but also not feeling well and didn't take meds this morning.  Continue current  regimen.  Check blood pressure out of office 3-5 times/weeka dn record and bring to next visit.  Schedule visit sooner if your BP is running consistently higher than 120/80.  We have discussed target BP range and blood pressure goal. I have advised patient to check BP regularly and to call us back or report to clinic if the numbers are consistently higher than 140/90. We discussed the importance of compliance with medical therapy and DASH diet recommended, consequences of uncontrolled hypertension discussed.         Patient have been counseled extensively about nutrition and exercise  Return for keep 07/08/2017 appt with Dr Alvis LemmingsNewlin.  The patient was given clear instructions to go to ER or return to medical center if symptoms don't improve, worsen or new problems develop. The patient verbalized understanding. The patient was told to call to get lab results if they haven't heard anything in the next  week.     Georgian Co, PA-C [facility] [place], [place] [phone]   06/05/2017, 10:36 AM

## 2017-06-11 MED FILL — LOSARTAN POTASSIUM 25 MG TA: 25 | 30 days supply | Qty: 30 | Fill #2

## 2017-06-23 MED FILL — VIT D2 1.25 MG (50,000 UNIT: 1.25 MG | 28 days supply | Qty: 4 | Fill #1

## 2017-06-23 MED FILL — HYDROCHLOROTHIAZIDE 25 MG T: 25 | 30 days supply | Qty: 30 | Fill #11

## 2017-06-25 ENCOUNTER — Ambulatory Visit: Payer: Self-pay | Attending: Family Medicine | Admitting: Physician Assistant

## 2017-06-25 ENCOUNTER — Telehealth: Payer: Self-pay | Admitting: Family Medicine

## 2017-06-25 VITALS — BP 117/80 | HR 80 | Temp 98.6°F | Resp 16 | Wt 300.6 lb

## 2017-06-25 DIAGNOSIS — Z30013 Encounter for initial prescription of injectable contraceptive: Secondary | ICD-10-CM | POA: Insufficient documentation

## 2017-06-25 DIAGNOSIS — J301 Allergic rhinitis due to pollen: Secondary | ICD-10-CM | POA: Insufficient documentation

## 2017-06-25 DIAGNOSIS — I10 Essential (primary) hypertension: Secondary | ICD-10-CM | POA: Insufficient documentation

## 2017-06-25 DIAGNOSIS — H00011 Hordeolum externum right upper eyelid: Secondary | ICD-10-CM | POA: Insufficient documentation

## 2017-06-25 DIAGNOSIS — Z309 Encounter for contraceptive management, unspecified: Secondary | ICD-10-CM

## 2017-06-25 DIAGNOSIS — Z79899 Other long term (current) drug therapy: Secondary | ICD-10-CM | POA: Insufficient documentation

## 2017-06-25 DIAGNOSIS — Z9109 Other allergy status, other than to drugs and biological substances: Secondary | ICD-10-CM

## 2017-06-25 MED ORDER — MEDROXYPROGESTERONE ACETATE 150 MG/ML IM SUSP
150.0000 mg | Freq: Once | INTRAMUSCULAR | Status: AC
  Administered 2017-06-25: 150 mg via INTRAMUSCULAR

## 2017-06-25 NOTE — Patient Instructions (Signed)
mStye A stye is a bump on your eyelid caused by a bacterial infection. A stye can form inside the eyelid (internal stye) or outside the eyelid (external stye). An internal stye may be caused by an infected oil-producing gland inside your eyelid. An external stye may be caused by an infection at the base of your eyelash (hair follicle). Styes are very common. Anyone can get them at any age. They usually occur in just one eye, but you may have more than one in either eye. What are the causes? The infection is almost always caused by bacteria called Staphylococcus aureus. This is a common type of bacteria that lives on your skin. What increases the risk? You may be at higher risk for a stye if you have had one before. You may also be at higher risk if you have:  Diabetes.  Long-term illness.  Long-term eye redness.  A skin condition called seborrhea.  High fat levels in your blood (lipids).  What are the signs or symptoms? Eyelid pain is the most common symptom of a stye. Internal styes are more painful than external styes. Other signs and symptoms may include:  Painful swelling of your eyelid.  A scratchy feeling in your eye.  Tearing and redness of your eye.  Pus draining from the stye.  How is this diagnosed? Your health care provider may be able to diagnose a stye just by examining your eye. The health care provider may also check to make sure:  You do not have a fever or other signs of a more serious infection.  The infection has not spread to other parts of your eye or areas around your eye.  How is this treated? Most styes will clear up in a few days without treatment. In some cases, you may need to use antibiotic drops or ointment to prevent infection. Your health care provider may have to drain the stye surgically if your stye is:  Large.  Causing a lot of pain.  Interfering with your vision.  This can be done using a thin blade or a needle. Follow these  instructions at home:  Take medicines only as directed by your health care provider.  Apply a clean, warm compress to your eye for 10 minutes, 4 times a day.  Do not wear contact lenses or eye makeup until your stye has healed.  Do not try to pop or drain the stye. Contact a health care provider if:  You have chills or a fever.  Your stye does not go away after several days.  Your stye affects your vision.  Your eyeball becomes swollen, red, or painful. This information is not intended to replace advice given to you by your health care provider. Make sure you discuss any questions you have with your health care provider. Document Released: 01/02/2005 Document Revised: 11/19/2015 Document Reviewed: 07/09/2013 Elsevier Interactive Patient Education  [employer]2018 Elsevier Inc.

## 2017-06-25 NOTE — Progress Notes (Signed)
Patient ID: Crystal Burns, female   DOB: [birth year], 37 y.o.   MRN: [id number]   Crystal Burns, is a 37 y.o. female  [id number]  [id number]  DOB - 21-Jan-1981  Subjective:  Chief Complaint and HPI: Crystal Burns is a 37 y.o. female here today for several issues.  Wants to get Depo shot.  She has had some intermittent bleeding since her 1st Depo shot in January.  Not heavy-just irregular.  Also Stye on R upper lid for a few weeks.  It is a little tender and aggravating.  Also having BP up sometimes at home 140-150/90s and sometimes normal at 120/80.  Some HA.  Her allergies and congestion are acting up.  Some sneezing and nasal congestion.   ROS:   Constitutional:  No f/c, No night sweats, No unexplained weight loss. EENT:  No vision changes, No blurry vision, No hearing changes. No additional mouth, throat, or ear problems.  Respiratory: No cough, No SOB Cardiac: No CP, no palpitations GI:  No abd pain, No N/V/D. GU: No Urinary s/sx Musculoskeletal: No joint pain Neuro: some  headache, no dizziness, no motor weakness.  Skin: No rash Endocrine:  No polydipsia. No polyuria.  Psych: Denies SI/HI  No problems updated.  ALLERGIES: Allergies  Allergen Reactions  . Bee Venom Hives and Swelling  . Lisinopril Shortness Of Breath and Cough  . Shellfish Allergy Anaphylaxis    PAST MEDICAL HISTORY: Past Medical History:  Diagnosis Date  . Asthma    2010-only bothers pt during bad cold  . Atypical chest pain 01/16/2016   Atypical chest pain  . Depression    2010-had when mother passed away  . History of gonorrhea   . Hypertension    2004  . Obesity   . Preeclampsia complicating hypertension 01/21/2015    MEDICATIONS AT HOME: Prior to Admission medications   Medication Sig Start Date End Date Taking? Authorizing Provider  acetaminophen (TYLENOL) 325 MG tablet Take 2 tablets (650 mg total) by mouth every 6 (six) hours as needed. Patient not taking: Reported  on 05/07/2017 05/27/16   Derwood Kaplan, MD  butalbital-acetaminophen-caffeine (FIORICET, ESGIC) [phone] MG tablet Take 1-2 tablets by mouth every 12 (twelve) hours as needed for headache. Patient not taking: Reported on 05/07/2017 01/29/17 01/29/18  Hoy Register, MD  diclofenac (VOLTAREN) 75 MG EC tablet Take 1 tablet (75 mg total) by mouth 2 (two) times daily. Patient not taking: Reported on 05/07/2017 03/11/17   Tarry Kos, MD  diclofenac sodium (VOLTAREN) 1 % GEL Apply 2 g topically 4 (four) times daily. Patient not taking: Reported on 05/07/2017 03/11/17   Tarry Kos, MD  fluticasone [facility]) 50 MCG/ACT nasal spray Place 2 sprays into both nostrils daily. 06/05/17   Anders Simmonds, PA-C  HYDROCHLOROTHIAZIDE PO Take by mouth.    [provider]  ibuprofen (ADVIL,MOTRIN) 600 MG tablet Take 1 tablet (600 mg total) by mouth every 6 (six) hours as needed. Patient not taking: Reported on 05/07/2017 05/27/16   Derwood Kaplan, MD  levocetirizine (XYZAL) 5 MG tablet Take 1 tablet (5 mg total) by mouth every evening. Patient not taking: Reported on 05/07/2017 12/03/16   Hoy Register, MD  losartan (COZAAR) 25 MG tablet Take 1 tablet (25 mg total) by mouth daily. 04/14/17   Hoy Register, MD  naproxen (NAPROSYN) 500 MG tablet Take 1 tablet (500 mg total) by mouth 2 (two) times daily with a meal. Patient not taking: Reported on 05/07/2017 01/27/17   Linus Mako  B, NP  Olopatadine HCl (PATADAY) 0.2 % SOLN Apply 1 drop to eye 2 (two) times daily. Patient not taking: Reported on 05/07/2017 12/03/16   Hoy RegisterNewlin, Enobong, MD  omeprazole (PRILOSEC) 20 MG capsule Take 20 mg by mouth daily.    [provider]  oseltamivir (TAMIFLU) 75 MG capsule Take 1 capsule (75 mg total) by mouth daily. 06/05/17   Anders SimmondsMcClung, Angela M, PA-C  Vitamin D, Ergocalciferol, (DRISDOL) 50000 units CAPS capsule Take 1 capsule (50,000 Units total) by mouth every 7 (seven) days. 05/08/17   Claiborne RiggFleming, Zelda W, NP      Objective:  EXAM:   Vitals:   06/25/17 1605  BP: 117/80  Pulse: 80  Resp: 16  Temp: 98.6 F (37 C)  TempSrc: Oral  SpO2: 98%  Weight: (!) 300 lb 9.6 oz (136.4 kg)    General appearance : A&OX3. NAD. Non-toxic-appearing HEENT: Atraumatic and Normocephalic.  PERRLA. EOM intact. Small hordeolum present on R Upper lid toward lateral canthus. Conjunctivae is B normal.  TM full B. Turbinates pale, enlarged and boggy Mouth-MMM, post pharynx WNL w/o erythema, No PND. Neck: supple, no JVD. No cervical lymphadenopathy. No thyromegaly Chest/Lungs:  Breathing-non-labored, Good air entry bilaterally, breath sounds normal without rales, rhonchi, or wheezing  CVS: S1 S2 regular, no murmurs, gallops, rubs  Extremities: Bilateral Lower Ext shows no edema, both legs are warm to touch with = pulse throughout Neurology:  CN II-XII grossly intact, Non focal.   Psych:  TP linear. J/I WNL. Normal speech. Appropriate eye contact and affect.  Skin:  No Rash  Data Review Lab Results  Component Value Date   HGBA1C 5.60 05/18/2015   HGBA1C  01/30/2008    6.1 (NOTE)   The ADA recommends the following therapeutic goal for glycemic   control related to Hgb A1C measurement:   Goal of Therapy:   < 7.0% Hgb A1C   Reference: American Diabetes Association: Clinical Practice   Recommendations 2008, Diabetes Care,  2008, 31:(Suppl 1).     Assessment & Plan   1. Hordeolum externum of right upper eyelid Informational h/o.  If doesn't improve in a week or 2, she can go to a vision center to have her eye checked.    2. Encounter for contraceptive management, unspecified type Irregular bleeding as expected.  Counseled at [facility] on this - medroxyPROGESTERone (DEPO-PROVERA) injection 150 mg  3. Essential hypertension At goal.  Continue current regimen  4. Pollen allergies claritin and flonase.    Patient have been counseled extensively about nutrition and exercise  Return for keep 4/2 appt.  The  patient was given clear instructions to go to ER or return to medical center if symptoms don't improve, worsen or new problems develop. The patient verbalized understanding. The patient was told to call to get lab results if they haven't heard anything in the next week.     Georgian CoAngela McClung, PA-C [facility] [place], [place] [phone]   06/25/2017, 4:24 PM

## 2017-06-30 ENCOUNTER — Ambulatory Visit: Payer: Medicaid Other

## 2017-07-08 ENCOUNTER — Ambulatory Visit: Payer: Medicaid Other | Attending: Family Medicine | Admitting: Family Medicine

## 2017-07-14 MED FILL — LOSARTAN POTASSIUM 25 MG TA: 25 | 30 days supply | Qty: 30 | Fill #3

## 2017-07-21 ENCOUNTER — Encounter (HOSPITAL_COMMUNITY): Payer: Self-pay | Admitting: Family Medicine

## 2017-07-21 ENCOUNTER — Ambulatory Visit (HOSPITAL_COMMUNITY)
Admission: EM | Admit: 2017-07-21 | Discharge: 2017-07-21 | Disposition: A | Discharge status: Home / Self Care | Payer: Self-pay | Attending: Internal Medicine | Admitting: Internal Medicine

## 2017-07-21 ENCOUNTER — Ambulatory Visit (INDEPENDENT_AMBULATORY_CARE_PROVIDER_SITE_OTHER): Payer: Self-pay

## 2017-07-21 DIAGNOSIS — S60211A Contusion of right wrist, initial encounter: Secondary | ICD-10-CM

## 2017-07-21 DIAGNOSIS — G5601 Carpal tunnel syndrome, right upper limb: Secondary | ICD-10-CM

## 2017-07-21 NOTE — Discharge Instructions (Addendum)
Please wear Velcro wrist brace at all times except for showering for 4 weeks.  Please take anti-inflammatory medication such as naproxen or ibuprofen as needed for pain.  If no improvement in 1-2 weeks of wrist pain follow-up with orthopedics.

## 2017-07-21 NOTE — ED Triage Notes (Signed)
Pt  Her right wrist pain after trying to close a door against resistance. sts unable to turn a can, rotate wrist. This happened yesterday.

## 2017-07-21 NOTE — ED Provider Notes (Signed)
[facility]    CSN: [id number] Arrival date & time: 07/21/17  1759     History   Chief Complaint Chief Complaint  Patient presents with  . Wrist Pain    HPI Crystal Burns is a 37 y.o. female presents to the urgent care facility for evaluation of right wrist pain.  Patient states 2 days ago she showed her right wrist into a door.  She developed pain and swelling along the ulnar aspect of her right wrist.  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  Patient also notes numbness and tingling in the median nerve distribution of the right hand has been present for years, states she types a lot at work but has not tried any bracing.  Pain is moderate throughout the wrist from the contusion.  She has not taking any anti-inflammatory medications although she has prescriptions for naproxen, ibuprofen and Voltaren.  Patient understands she can only take 1 NSAID at a time.  HPI  Past Medical History:  Diagnosis Date  . Asthma    2010-only bothers pt during bad cold  . Atypical chest pain 01/16/2016   Atypical chest pain  . Depression    2010-had when mother passed away  . History of gonorrhea   . Hypertension    2004  . Obesity   . Preeclampsia complicating hypertension 01/21/2015    Patient Active Problem List   Diagnosis Date Noted  . Acute medial meniscus tear of right knee 02/10/2017  . Chronic sinusitis 07/23/2016  . Vitamin D deficiency 07/10/2015  . Anxiety state 06/20/2015  . Costochondritis 05/18/2015  . Primary osteoarthritis of right knee 04/22/2014  . Essential hypertension 07/18/2012  . LUMBOSACRAL STRAIN, ACUTE 07/05/2008  . Morbid obesity (HCC) 02/24/2008  . Asthma with exacerbation 02/24/2008    Past Surgical History:  Procedure Laterality Date  . CHOLECYSTECTOMY    . DILATION AND CURETTAGE OF  UTERUS    . WISDOM TOOTH EXTRACTION      OB History    Gravida  11   Para  7   Term  7   Preterm  0   AB  4   Living  6     SAB  4   TAB  0   Ectopic  0   Multiple  0   Live Births  7            Home Medications    Prior to Admission medications   Medication Sig Start Date End Date Taking? Authorizing Provider  acetaminophen (TYLENOL) 325 MG tablet Take 2 tablets (650 mg total) by mouth every 6 (six) hours as needed. Patient not taking: Reported on 05/07/2017 05/27/16   Derwood Kaplan, MD  butalbital-acetaminophen-caffeine (FIORICET, ESGIC) [id number] MG tablet Take 1-2 tablets by mouth every 12 (twelve) hours as needed for headache. Patient not taking: Reported on 05/07/2017 01/29/17 01/29/18  Hoy Register, MD  diclofenac (VOLTAREN) 75 MG EC tablet Take 1 tablet (75 mg total) by mouth 2 (two) times daily. Patient not taking: Reported on 05/07/2017 03/11/17   Tarry Kos, MD  diclofenac sodium (VOLTAREN) 1 % GEL Apply 2 g topically 4 (four) times daily. Patient not taking: Reported on 05/07/2017 03/11/17   Tarry Kos, MD  fluticasone [facility]) 50 MCG/ACT nasal spray Place 2 sprays into both  nostrils daily. 06/05/17   Anders Simmonds, PA-C  HYDROCHLOROTHIAZIDE PO Take by mouth.    [provider]  ibuprofen (ADVIL,MOTRIN) 600 MG tablet Take 1 tablet (600 mg total) by mouth every 6 (six) hours as needed. Patient not taking: Reported on 05/07/2017 05/27/16   Derwood Kaplan, MD  levocetirizine (XYZAL) 5 MG tablet Take 1 tablet (5 mg total) by mouth every evening. Patient not taking: Reported on 05/07/2017 12/03/16   Hoy Register, MD  losartan (COZAAR) 25 MG tablet Take 1 tablet (25 mg total) by mouth daily. 04/14/17   Hoy Register, MD  naproxen (NAPROSYN) 500 MG tablet Take 1 tablet (500 mg total) by mouth 2 (two) times daily with a meal. Patient not taking: Reported on 05/07/2017 01/27/17   Linus Mako B, NP  Olopatadine HCl (PATADAY) 0.2 % SOLN Apply  1 drop to eye 2 (two) times daily. Patient not taking: Reported on 05/07/2017 12/03/16   Hoy Register, MD  omeprazole (PRILOSEC) 20 MG capsule Take 20 mg by mouth daily.    [provider]  oseltamivir (TAMIFLU) 75 MG capsule Take 1 capsule (75 mg total) by mouth daily. 06/05/17   Anders Simmonds, PA-C  Vitamin D, Ergocalciferol, (DRISDOL) 50000 units CAPS capsule Take 1 capsule (50,000 Units total) by mouth every 7 (seven) days. 05/08/17   Claiborne Rigg, NP    Family History Family History  Problem Relation Age of Onset  . Hypertension Mother   . Diabetes Mother   . Cancer Mother   . Hypertension Father   . Diabetes Father   . Hyperlipidemia Father   . Arthritis Unknown   . Asthma Unknown   . Breast cancer Unknown   . Heart failure Unknown   . Congenital heart disease Unknown   . Depression Unknown   . Heart attack Unknown     Social History Social History   Tobacco Use  . Smoking status: Former Smoker    Types: Cigarettes    Last attempt to quit: 10/31/2010    Years since quitting: 6.7  . Smokeless tobacco: Never Used  Substance Use Topics  . Alcohol use: No    Alcohol/week: 0.0 oz  . Drug use: No     Allergies   Bee venom; Lisinopril; and Shellfish allergy   Review of Systems Review of Systems  Constitutional: Negative for fever.  Respiratory: Negative for shortness of breath.   Cardiovascular: Negative for chest pain.  Gastrointestinal: Negative for abdominal pain.  Genitourinary: Negative for urgency.  Musculoskeletal: Positive for arthralgias. Negative for back pain, joint swelling and myalgias.  Skin: Negative for rash and wound.  Neurological: Positive for numbness. Negative for dizziness and headaches.     Physical Exam Triage Vital Signs ED Triage Vitals  Enc Vitals Group     BP 07/21/17 1836 129/85     Pulse Rate 07/21/17 1836 75     Resp 07/21/17 1836 18     Temp 07/21/17 1836 98.2 F (36.8 C)     Temp src --      SpO2  07/21/17 1836 100 %     Weight --      Height --      Head Circumference --      Peak Flow --      Pain Score 07/21/17 1834 7     Pain Loc --      Pain Edu? --      Excl. in GC? --    No data found.  Updated Vital Signs BP 129/85   Pulse 75   Temp 98.2 F (36.8 C)   Resp 18   SpO2 100%   Visual Acuity Right Eye Distance:   Left Eye Distance:   Bilateral Distance:    Right Eye Near:   Left Eye Near:    Bilateral Near:     Physical Exam  Constitutional: She is oriented to person, place, and time. She appears well-developed and well-nourished.  HENT:  Head: Normocephalic and atraumatic.  Eyes: Conjunctivae are normal.  Neck: Normal range of motion.  Cardiovascular: Normal rate.  Pulmonary/Chest: Effort normal. No respiratory distress.  Musculoskeletal: Normal range of motion.  Examination of right wrist shows patient has no swelling warmth erythema.  Wrist is tender along the ulnar styloid.  No skin breakdown noted.  She has a positive Tinel's and Phalen sign.  No thenar atrophy.  Normal sensation throughout the left upper extremity.  She has a soft tissue mass in the dorsum of the right forearm consistent with lipoma.  No warmth redness or drainage  noted along the lipoma.  No ecchymosis noted.  Neurological: She is alert and oriented to person, place, and time.  Skin: Skin is warm. No rash noted.  Psychiatric: She has a normal mood and affect. Her behavior is normal. Thought content normal.     UC Treatments / Results  Labs (all labs ordered are listed, but only abnormal results are displayed) Labs Reviewed - No data to display  EKG None Radiology Dg Wrist Complete Right  Result Date: 07/21/2017 CLINICAL DATA:  Right wrist injury with pain. EXAM: RIGHT WRIST - COMPLETE 3+ VIEW COMPARISON:  None. FINDINGS: No evidence for an acute fracture. No subluxation or dislocation. Degenerative spurring is identified in the radiocarpal joint. IMPRESSION: Degenerative changes  in the radiocarpal joint. Electronically Signed   By: Kennith CenterEric  Mansell M.D.   On: 07/21/2017 19:01    Procedures Splint Application Date/Time: 07/21/2017 7:02 PM Performed by: Evon SlackGaines, Fernande Treiber C, PA-C Authorized by: Isa RankinMurray, Laura Wilson, MD   Consent:    Consent obtained:  Verbal   Consent given by:  Patient   Alternatives discussed:  No treatment Pre-procedure details:    Sensation:  Normal Procedure details:    Laterality:  Right   Location:  Wrist   Cast type:  Short arm   Splint type:  Wrist   Supplies:  Prefabricated splint Post-procedure details:    Pain:  Unchanged   Sensation:  Normal   Patient tolerance of procedure:  Tolerated well, no immediate complications   (including critical care time)  Medications Ordered in UC Medications - No data to display   Initial Impression / Assessment and Plan / UC Course  I have reviewed the triage vital signs and the nursing notes.  Pertinent labs & imaging results that were available during my care of the patient were reviewed by me and considered in my medical decision making (see chart for details).     Patient is placed into a Velcro wrist brace today in the office.  She is diagnosed with carpal tunnel syndrome along with wrist contusion.  X-rays ordered and reviewed by me today show no evidence of acute bony normality of the right wrist, she was found to have mild arthritic changes throughout the radiocarpal joint.  She will wear Velcro wrist brace at all times except showering over the next 4 weeks, mostly this will help with carpal tunnel symptoms.  Contusion pain should be resolved in the next 3-4  days.  If continued pain in [redacted] week along the ulnar styloid from shutting her wrist in the door patient will follow with PCP.  Encouraged patient to take anti-inflammatory medication daily for the next 2-3 days, she will take ibuprofen 600 800 mg 3 times daily as needed  Final Clinical Impressions(s) / UC Diagnoses   Final diagnoses:    Contusion of right wrist, initial encounter  Carpal tunnel syndrome, right    ED Discharge Orders    None         Ronnette Juniper 07/21/17 1909

## 2017-07-23 ENCOUNTER — Other Ambulatory Visit: Payer: Self-pay | Admitting: Family Medicine

## 2017-07-23 DIAGNOSIS — I10 Essential (primary) hypertension: Secondary | ICD-10-CM

## 2017-07-31 ENCOUNTER — Telehealth: Payer: Self-pay

## 2017-07-31 NOTE — Telephone Encounter (Signed)
Patient called requesting medication refill for her HTN She is out on her meds   CMA told patient that she would need an office visit for her to get a refill because its been more than 6 month that she was seen by pcp, CMA spoke with pcp nurse just to verify   CMA offer one in may but she said that she need her meds way before that. she requested Hydrochlorothiazide & she not even suppose to take that   CMA told patient that she needs to call back on Monday to get a walkin appt   Patient stated that if something happen to her that it would be the office fault

## 2017-08-04 ENCOUNTER — Telehealth: Payer: Self-pay | Admitting: Family Medicine

## 2017-08-04 NOTE — Telephone Encounter (Signed)
HYDROCHLOROTHIAZIDE PO [[id number]]  Patient called and requested for listed medications to be refilled until upcoming appointment, patient stated she has been completely out for a couple of days now. Please sen to [facility] pharmacy.

## 2017-08-06 ENCOUNTER — Ambulatory Visit: Payer: Medicaid Other

## 2017-08-06 NOTE — Progress Notes (Deleted)
Patient ID: Crystal Burns, female   DOB: 08/18/1980, 37 y.o.   MRN: [id number] After being seen in the urgent care facility 07/21/2017 for evaluation of right wrist pain.  Patient states 2 days ago she showed her right wrist into a door.  She developed pain and swelling along the ulnar aspect of her right wrist.  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  Patient also notes numbness and tingling in the median nerve distribution of the right hand has been present for years, states she types a lot at work but has not tried any bracing.  Pain is moderate throughout the wrist from the contusion.  She has not taking any anti-inflammatory medications although she has prescriptions for naproxen, ibuprofen and Voltaren.  Patient understands she can only take 1 NSAID at a time.  Patient is placed into a Velcro wrist brace today in the office.  She is diagnosed with carpal tunnel syndrome along with wrist contusion.  X-rays ordered and reviewed by me today show no evidence of acute bony normality of the right wrist, she was found to have mild arthritic changes throughout the radiocarpal joint.  She will wear Velcro wrist brace at all times except showering over the next 4 weeks, mostly this will help with carpal tunnel symptoms.  Contusion pain should be resolved in the next 3-4 days.  If continued pain in [redacted] week along the ulnar styloid from shutting her wrist in the door patient will follow with PCP.  Encouraged patient to take anti-inflammatory medication daily for the next 2-3 days, she will take ibuprofen 600 800 mg 3 times daily as needed

## 2017-08-07 ENCOUNTER — Encounter (HOSPITAL_COMMUNITY): Payer: Self-pay | Admitting: Emergency Medicine

## 2017-08-07 ENCOUNTER — Other Ambulatory Visit: Payer: Self-pay

## 2017-08-07 ENCOUNTER — Ambulatory Visit (HOSPITAL_COMMUNITY)
Admission: EM | Admit: 2017-08-07 | Discharge: 2017-08-07 | Disposition: A | Discharge status: Home / Self Care | Payer: Self-pay | Attending: Family Medicine | Admitting: Family Medicine

## 2017-08-07 DIAGNOSIS — J4521 Mild intermittent asthma with (acute) exacerbation: Secondary | ICD-10-CM

## 2017-08-07 DIAGNOSIS — J0100 Acute maxillary sinusitis, unspecified: Secondary | ICD-10-CM

## 2017-08-07 DIAGNOSIS — I10 Essential (primary) hypertension: Secondary | ICD-10-CM

## 2017-08-07 MED ORDER — AZITHROMYCIN 250 MG PO TABS
1000.0000 mg | ORAL_TABLET | Freq: Once | ORAL | Status: AC
Start: 2017-08-07 — End: 2017-08-07
  Administered 2017-08-07: 1000 mg via ORAL

## 2017-08-07 MED ORDER — AZITHROMYCIN 250 MG PO TABS
ORAL_TABLET | ORAL | Status: AC
  Filled 2017-08-07: qty 4

## 2017-08-07 MED ORDER — ALBUTEROL SULFATE HFA 108 (90 BASE) MCG/ACT IN AERS: 2.0000 | INHALATION_SPRAY | RESPIRATORY_TRACT | 11 refills | Status: DC | PRN

## 2017-08-07 MED ORDER — HYDROCHLOROTHIAZIDE 12.5 MG PO CAPS: 12.5000 mg | ORAL_CAPSULE | Freq: Every day | ORAL | 3 refills | Status: DC

## 2017-08-07 MED ORDER — METHYLPREDNISOLONE ACETATE 80 MG/ML IJ SUSP
40.0000 mg | Freq: Once | INTRAMUSCULAR | Status: AC
  Administered 2017-08-07: 40 mg via INTRAMUSCULAR

## 2017-08-07 MED ORDER — METHYLPREDNISOLONE ACETATE 40 MG/ML IJ SUSP
INTRAMUSCULAR | Status: AC
Start: 2017-08-07 — End: ?
  Filled 2017-08-07: qty 1

## 2017-08-07 MED ORDER — ALBUTEROL SULFATE HFA 108 (90 BASE) MCG/ACT IN AERS
2.0000 | INHALATION_SPRAY | Freq: Once | RESPIRATORY_TRACT | Status: AC
  Administered 2017-08-07: 2 via RESPIRATORY_TRACT

## 2017-08-07 MED ORDER — LOSARTAN POTASSIUM 25 MG PO TABS: 25.0000 mg | ORAL_TABLET | Freq: Every day | ORAL | 3 refills | Status: DC

## 2017-08-07 MED ORDER — ALBUTEROL SULFATE HFA 108 (90 BASE) MCG/ACT IN AERS
INHALATION_SPRAY | RESPIRATORY_TRACT | Status: AC
  Filled 2017-08-07: qty 6.7

## 2017-08-07 MED ORDER — IPRATROPIUM-ALBUTEROL 0.5-2.5 (3) MG/3ML IN SOLN
3.0000 mL | Freq: Once | RESPIRATORY_TRACT | Status: AC
  Administered 2017-08-07: 3 mL via RESPIRATORY_TRACT

## 2017-08-07 MED ORDER — IPRATROPIUM-ALBUTEROL 0.5-2.5 (3) MG/3ML IN SOLN
RESPIRATORY_TRACT | Status: AC
  Filled 2017-08-07: qty 3

## 2017-08-07 MED ORDER — ALBUTEROL SULFATE (2.5 MG/3ML) 0.083% IN NEBU: 2.5000 mg | INHALATION_SOLUTION | Freq: Four times a day (QID) | RESPIRATORY_TRACT | 12 refills | Status: DC | PRN

## 2017-08-07 NOTE — ED Triage Notes (Addendum)
Thick, yellow phlegm, headache, head and chest congestion.  This is the 6th day of feeling sick  Out of hctz and losartan, no more refills  Twisted right knee and fell on right knee last week

## 2017-08-07 NOTE — ED Provider Notes (Addendum)
[facility]   [id number] 08/07/17 Arrival Time: 1851   SUBJECTIVE:  Crystal Burns is a 37 y.o. female who presents to the urgent care with complaint of Thick, yellow phlegm, headache, head and chest congestion.  This is the 6th day of feeling sick  Patient has run out of her antihypertensive meds as well.  Patient has seasonal allergies.   Past Medical History:  Diagnosis Date  . Asthma    2010-only bothers pt during bad cold  . Atypical chest pain 01/16/2016   Atypical chest pain  . Depression    2010-had when mother passed away  . History of gonorrhea   . Hypertension    2004  . Obesity   . Preeclampsia complicating hypertension 01/21/2015   Family History  Problem Relation Age of Onset  . Hypertension Mother   . Diabetes Mother   . Cancer Mother   . Hypertension Father   . Diabetes Father   . Hyperlipidemia Father   . Arthritis Unknown   . Asthma Unknown   . Breast cancer Unknown   . Heart failure Unknown   . Congenital heart disease Unknown   . Depression Unknown   . Heart attack Unknown    Social History   Socioeconomic History  . Marital status: Single    Spouse name: Not on file  . Number of children: Not on file  . Years of education: Not on file  . Highest education level: Not on file  Occupational History  . Not on file  Social Needs  . Financial resource strain: Not on file  . Food insecurity:    Worry: Not on file    Inability: Not on file  . Transportation needs:    Medical: Not on file    Non-medical: Not on file  Tobacco Use  . Smoking status: Former Smoker    Types: Cigarettes    Last attempt to quit: 10/31/2010    Years since quitting: 6.7  . Smokeless tobacco: Never Used  Substance and Sexual Activity  . Alcohol use: No    Alcohol/week: 0.0 oz  . Drug use: No  . Sexual activity: Not on file  Lifestyle  . Physical activity:    Days per week: Not on file    Minutes per session: Not on file  . Stress: Not on  file  Relationships  . Social connections:    Talks on phone: Not on file    Gets together: Not on file    Attends religious service: Not on file    Active member of club or organization: Not on file    Attends meetings of clubs or organizations: Not on file    Relationship status: Not on file  . Intimate partner violence:    Fear of current or ex partner: Not on file    Emotionally abused: Not on file    Physically abused: Not on file    Forced sexual activity: Not on file  Other Topics Concern  . Not on file  Social History Narrative  . Not on file   No outpatient medications have been marked as taking for the 08/07/17 encounter [facility] Encounter).   Allergies  Allergen Reactions  . Bee Venom Hives and Swelling  . Lisinopril Shortness Of Breath and Cough  . Shellfish Allergy Anaphylaxis      ROS: As per HPI, remainder of ROS negative.   OBJECTIVE:   Vitals:   08/07/17 1922  BP: (!) 153/92  Pulse: 73  Resp: 18  Temp: 98.2 F (36.8 C)  TempSrc: Oral  SpO2: 98%     General appearance: alert; no distress Eyes: PERRL; EOMI; conjunctiva normal HENT: normocephalic; atraumatic; TMs serous otitis changes bilaterally, canal normal, external ears normal without trauma; nasal mucosa normal; oral mucosa normal Neck: supple Lungs: wheezes to auscultation bilaterally Heart: regular rate and rhythm Back: no CVA tenderness Extremities: no cyanosis or edema; symmetrical with no gross deformities Skin: warm and dry Neurologic: normal gait; grossly normal Psychological: alert and cooperative; normal mood and affect      Labs:  Results for orders placed or performed in visit on 05/07/17  VITAMIN D 25 Hydroxy (Vit-D Deficiency, Fractures)  Result Value Ref Range   Vit D, 25-Hydroxy 14.2 (L) 30.0 - 100.0 ng/mL    Labs Reviewed - No data to display  No results found.     ASSESSMENT & PLAN:  1. Mild intermittent asthma with acute exacerbation   2. Essential  hypertension   3. Acute non-recurrent maxillary sinusitis     Meds ordered this encounter  Medications  . ipratropium-albuterol (DUONEB) 0.5-2.5 (3) MG/3ML nebulizer solution 3 mL  . albuterol (PROVENTIL HFA;VENTOLIN HFA) 108 (90 Base) MCG/ACT inhaler 2 puff  . azithromycin (ZITHROMAX) tablet 1,000 mg  . hydrochlorothiazide (MICROZIDE) 12.5 MG capsule    Sig: Take 1 capsule (12.5 mg total) by mouth daily.    Dispense:  90 capsule    Refill:  3  . losartan (COZAAR) 25 MG tablet    Sig: Take 1 tablet (25 mg total) by mouth daily.    Dispense:  90 tablet    Refill:  3  . albuterol (PROVENTIL HFA;VENTOLIN HFA) 108 (90 Base) MCG/ACT inhaler    Sig: Inhale 2 puffs into the lungs every 4 (four) hours as needed for wheezing or shortness of breath (cough, shortness of breath or wheezing.).    Dispense:  1 Inhaler    Refill:  11  . albuterol (PROVENTIL) (2.5 MG/3ML) 0.083% nebulizer solution    Sig: Take 3 mLs (2.5 mg total) by nebulization every 6 (six) hours as needed for wheezing or shortness of breath.    Dispense:  75 mL    Refill:  12  . methylPREDNISolone acetate (DEPO-MEDROL) injection 40 mg    Reviewed expectations re: course of current medical issues. Questions answered. Outlined signs and symptoms indicating need for more acute intervention. Patient verbalized understanding. After Visit Summary given.    Procedures:  After nebulizer Rx --> improved auscultation and symptomatically      Elvina Sidle, MD 08/07/17 Rolland Porter, MD 08/07/17 2022

## 2017-08-11 MED FILL — LOSARTAN POTASSIUM 25 MG TA: 25 | 30 days supply | Qty: 30 | Fill #0

## 2017-08-11 MED FILL — HYDROCHLOROTHIAZIDE 12.5 MG: 12.5 | 30 days supply | Qty: 30 | Fill #0

## 2017-08-14 ENCOUNTER — Ambulatory Visit: Payer: Medicaid Other

## 2017-08-21 ENCOUNTER — Encounter

## 2017-09-11 MED FILL — LOSARTAN POTASSIUM 25 MG TA: 25 | 30 days supply | Qty: 30 | Fill #1

## 2017-09-11 MED FILL — HYDROCHLOROTHIAZIDE 12.5 MG: 12.5 | 30 days supply | Qty: 30 | Fill #1

## 2017-10-07 ENCOUNTER — Ambulatory Visit: Payer: Medicaid Other

## 2017-10-07 ENCOUNTER — Ambulatory Visit: Payer: Medicaid Other | Admitting: Internal Medicine

## 2017-10-13 MED FILL — HYDROCHLOROTHIAZIDE 12.5 MG: 12.5 | 30 days supply | Qty: 30 | Fill #2

## 2017-10-13 MED FILL — LOSARTAN POTASSIUM 25 MG TA: 25 | 30 days supply | Qty: 30 | Fill #2

## 2017-10-23 ENCOUNTER — Ambulatory Visit (HOSPITAL_COMMUNITY)
Admission: RE | Admit: 2017-10-23 | Discharge: 2017-10-23 | Disposition: A | Discharge status: Home / Self Care | Payer: Medicaid Other | Source: Ambulatory Visit | Attending: Physician Assistant | Admitting: Physician Assistant

## 2017-10-23 ENCOUNTER — Ambulatory Visit: Payer: Medicaid Other

## 2017-10-23 ENCOUNTER — Ambulatory Visit: Payer: Self-pay | Attending: Family Medicine | Admitting: Physician Assistant

## 2017-10-23 VITALS — BP 133/86 | HR 70 | Temp 98.5°F | Wt 297.0 lb

## 2017-10-23 DIAGNOSIS — R490 Dysphonia: Secondary | ICD-10-CM

## 2017-10-23 DIAGNOSIS — J040 Acute laryngitis: Secondary | ICD-10-CM | POA: Insufficient documentation

## 2017-10-23 DIAGNOSIS — Z888 Allergy status to other drugs, medicaments and biological substances status: Secondary | ICD-10-CM | POA: Insufficient documentation

## 2017-10-23 DIAGNOSIS — Z91030 Bee allergy status: Secondary | ICD-10-CM | POA: Insufficient documentation

## 2017-10-23 DIAGNOSIS — Z3042 Encounter for surveillance of injectable contraceptive: Secondary | ICD-10-CM | POA: Insufficient documentation

## 2017-10-23 DIAGNOSIS — Z91013 Allergy to seafood: Secondary | ICD-10-CM | POA: Insufficient documentation

## 2017-10-23 DIAGNOSIS — R0981 Nasal congestion: Secondary | ICD-10-CM

## 2017-10-23 DIAGNOSIS — J45909 Unspecified asthma, uncomplicated: Secondary | ICD-10-CM | POA: Insufficient documentation

## 2017-10-23 DIAGNOSIS — F329 Major depressive disorder, single episode, unspecified: Secondary | ICD-10-CM | POA: Insufficient documentation

## 2017-10-23 DIAGNOSIS — J301 Allergic rhinitis due to pollen: Secondary | ICD-10-CM

## 2017-10-23 DIAGNOSIS — I10 Essential (primary) hypertension: Secondary | ICD-10-CM | POA: Insufficient documentation

## 2017-10-23 DIAGNOSIS — Z309 Encounter for contraceptive management, unspecified: Secondary | ICD-10-CM

## 2017-10-23 DIAGNOSIS — Z79899 Other long term (current) drug therapy: Secondary | ICD-10-CM | POA: Insufficient documentation

## 2017-10-23 DIAGNOSIS — E669 Obesity, unspecified: Secondary | ICD-10-CM | POA: Insufficient documentation

## 2017-10-23 LAB — POCT URINE PREGNANCY: PREG TEST UR: NEGATIVE

## 2017-10-23 MED ORDER — MEDROXYPROGESTERONE ACETATE 150 MG/ML IM SUSP
150.0000 mg | Freq: Once | INTRAMUSCULAR | Status: AC
  Administered 2017-10-23: 150 mg via INTRAMUSCULAR

## 2017-10-23 MED ORDER — CETIRIZINE HCL 10 MG PO TABS: 10.0000 mg | ORAL_TABLET | Freq: Every day | ORAL | 11 refills | Status: DC

## 2017-10-23 MED ORDER — FLUTICASONE PROPIONATE 50 MCG/ACT NA SUSP: 2.0000 | Freq: Every day | NASAL | 3 refills | Status: DC

## 2017-10-23 NOTE — Progress Notes (Signed)
Patient ID: Crystal Burns, female   DOB: January 09, 1981, 37 y.o.   MRN: [id number]     Crystal Burns, is a 37 y.o. female  NWG:[id number]  [id number]:[id number]  DOB - [birth year]  Subjective:  Chief Complaint and HPI: Crystal Burns is a 37 y.o. female here for depo shot and laryngitis/hoarseness.  Ears popping, voice is hoarse for 3 weeks, feels like something is getting stuck in her throat but swallowing food without any problems.  Stopped all allergy meds a while ago.  flonase made her ears pop.  No weight loss.  No cough.  Occasional mucus that is clear.  Some runny nose occasionally and feels congested.    ROS:   Constitutional:  No f/c, No night sweats, No unexplained weight loss. EENT:  No vision changes, No blurry vision, No hearing changes. No additional mouth, throat, or ear problems.  Respiratory: occasional cough, No SOB Cardiac: No CP, no palpitations GI:  No abd pain, No N/V/D. GU: No Urinary s/sx Musculoskeletal: No joint pain Neuro: No headache, no dizziness, no motor weakness.  Skin: No rash Endocrine:  No polydipsia. No polyuria.  Psych: Denies SI/HI  No problems updated.  ALLERGIES: Allergies  Allergen Reactions  . Bee Venom Hives and Swelling  . Lisinopril Shortness Of Breath and Cough  . Shellfish Allergy Anaphylaxis    PAST MEDICAL HISTORY: Past Medical History:  Diagnosis Date  . Asthma    2010-only bothers pt during bad cold  . Atypical chest pain 01/16/2016   Atypical chest pain  . Depression    2010-had when mother passed away  . History of gonorrhea   . Hypertension    2004  . Obesity   . Preeclampsia complicating hypertension 01/21/2015    MEDICATIONS AT HOME: Prior to Admission medications   Medication Sig Start Date End Date Taking? Authorizing Provider  albuterol (PROVENTIL HFA;VENTOLIN HFA) 108 (90 Base) MCG/ACT inhaler Inhale 2 puffs into the lungs every 4 (four) hours as needed for wheezing or shortness of breath (cough,  shortness of breath or wheezing.). 08/07/17  Yes Elvina Sidle, MD  albuterol (PROVENTIL) (2.5 MG/3ML) 0.083% nebulizer solution Take 3 mLs (2.5 mg total) by nebulization every 6 (six) hours as needed for wheezing or shortness of breath. 08/07/17  Yes Elvina Sidle, MD  fluticasone (FLONASE) 50 MCG/ACT nasal spray Place 2 sprays into both nostrils daily. 10/23/17  Yes Georgian Co M, PA-C  hydrochlorothiazide (MICROZIDE) 12.5 MG capsule Take 1 capsule (12.5 mg total) by mouth daily. 08/07/17  Yes Elvina Sidle, MD  losartan (COZAAR) 25 MG tablet Take 1 tablet (25 mg total) by mouth daily. 08/07/17  Yes Elvina Sidle, MD  omeprazole (PRILOSEC) 20 MG capsule Take 20 mg by mouth daily.   Yes [provider]  Vitamin D, Ergocalciferol, (DRISDOL) 50000 units CAPS capsule Take 1 capsule (50,000 Units total) by mouth every 7 (seven) days. 05/08/17  Yes Claiborne Rigg, NP  cetirizine (ZYRTEC) 10 MG tablet Take 1 tablet (10 mg total) by mouth daily. 10/23/17   Anders Simmonds, PA-C     Objective:  EXAM:   Vitals:   10/23/17 1533  BP: 133/86  Pulse: 70  Temp: 98.5 F (36.9 C)  TempSrc: Oral  SpO2: 95%  Weight: 297 lb (134.7 kg)    General appearance : A&OX3. NAD. Non-toxic-appearing HEENT: Atraumatic and Normocephalic.  PERRLA. EOM intact.  TM full B. Mouth-MMM, post pharynx WNL w/o erythema, + PND. Neck: supple, no JVD. No cervical lymphadenopathy. No thyromegaly Chest/Lungs:  Breathing-non-labored, Good air entry bilaterally, breath sounds normal without rales, rhonchi, or wheezing  CVS: S1 S2 regular, no murmurs, gallops, rubs  Extremities: Bilateral Lower Ext shows no edema, both legs are warm to touch with = pulse throughout Neurology:  CN II-XII grossly intact, Non focal.   Psych:  TP linear. J/I WNL. Normal speech. Appropriate eye contact and affect.  Skin:  No Rash  Data Review Lab Results  Component Value Date   HGBA1C 5.60 05/18/2015   HGBA1C  01/30/2008     6.1 (NOTE)   The ADA recommends the following therapeutic goal for glycemic   control related to Hgb A1C measurement:   Goal of Therapy:   < 7.0% Hgb A1C   Reference: American Diabetes Association: Clinical Practice   Recommendations 2008, Diabetes Care,  2008, 31:(Suppl 1).     Assessment & Plan   1. Hoarseness Salt water gargles, CXR - DG Chest 2 View; Future  2. Encounter for contraceptive management, unspecified type - POCT urine pregnancy -Depo Provera  3. Allergic rhinitis due to pollen, unspecified seasonality - fluticasone (FLONASE) 50 MCG/ACT nasal spray; Place 2 sprays into both nostrils daily.  Dispense: 16 g; Refill: 3 - cetirizine (ZYRTEC) 10 MG tablet; Take 1 tablet (10 mg total) by mouth daily.  Dispense: 30 tablet; Refill: 11  4. Sinus congestion - fluticasone (FLONASE) 50 MCG/ACT nasal spray; Place 2 sprays into both nostrils daily.  Dispense: 16 g; Refill: 3   Patient have been counseled extensively about nutrition and exercise  Return in about 1 month (around 11/23/2017) for Dr Alvis LemmingsNewlin f/up hoarseness.  The patient was given clear instructions to go to ER or return to medical center if symptoms don't improve, worsen or new problems develop. The patient verbalized understanding. The patient was told to call to get lab results if they haven't heard anything in the next week.     Georgian CoAngela Seniyah Esker, PA-C [facility] [place], [place] [phone]   10/23/2017, 3:57 PM

## 2017-10-23 NOTE — Patient Instructions (Signed)
Hoarseness Hoarseness is any abnormal change in your voice.Hoarseness can make it difficult to speak. Your voice may sound raspy, breathy, or strained. Hoarseness is caused by a problem with the vocal cords. The vocal cords are two bands of tissue inside your voice box (larynx). When you speak, your vocal cords move back and forth to create sound. The surfaces of your vocal cords need to be smooth for your voice to sound clear. Swelling or lumps on the vocal cords can cause hoarseness. Common causes of vocal cord problems include:  Upper airway infection.  A long-term cough.  Straining or overusing your voice.  Smoking.  Allergies.  Vocal cord growths.  Stomach acids that flow up from your stomach and irritate your vocal cords (gastroesophageal reflux).  Follow these instructions at home: Watch your condition for any changes. To ease any discomfort that you feel:  Rest your voice. Do not whisper. Whispering can cause muscle strain.  Do not speak in a loud or harsh voice that makes your hoarseness worse.  Do not use any tobacco products, including cigarettes, chewing tobacco, or electronic cigarettes. If you need help quitting, ask your health care provider.  Avoid secondhand smoke.  Do not eat foods that give you heartburn. Heartburn can make gastroesophageal reflux worse.  Do not drink coffee.  Do not drink alcohol.  Drink enough fluids to keep your urine clear or pale yellow.  Use a humidifier if the air in your home is dry.  Contact a health care provider if:  You have hoarseness that lasts longer than 3 weeks.  You almost lose or completelylose your voice for longer than 3 days.  You have pain when you swallow or try to talk.  You feel a lump in your neck. Get help right away if:  You have trouble swallowing.  You feel as though you are choking when you swallow.  You cough up blood or vomit blood.  You have trouble breathing. This information is not  intended to replace advice given to you by your health care provider. Make sure you discuss any questions you have with your health care provider. Document Released: 03/08/2005 Document Revised: 08/31/2015 Document Reviewed: 03/16/2014 Elsevier Interactive Patient Education  2018 Elsevier Inc.  

## 2017-10-24 ENCOUNTER — Other Ambulatory Visit: Payer: Self-pay | Admitting: Physician Assistant

## 2017-10-24 MED ORDER — AZITHROMYCIN 250 MG PO TABS: ORAL_TABLET | ORAL | 0 refills | Status: DC

## 2017-10-24 MED FILL — AZITHROMYCIN 250 MG TABLET: 250 | 5 days supply | Qty: 6 | Fill #0

## 2017-10-28 ENCOUNTER — Telehealth: Payer: Self-pay | Admitting: *Deleted

## 2017-10-28 NOTE — Telephone Encounter (Signed)
-----   Message from Anders SimmondsAngela M McClung, [place]PA-C sent at 10/24/2017 11:44 AM EDT ----- Please call patient.  Her xray shows possible pneumonia.  I sent her an antibiotic to start asap.  Thanks, Georgian CoAngela McClung, PA-C

## 2017-10-28 NOTE — Telephone Encounter (Signed)
Patient verified DOB Patient is aware of pneumonia being noted in the chest x ray and [facility] Patient states she was on the way and had no further questions.

## 2017-11-05 ENCOUNTER — Ambulatory Visit (HOSPITAL_COMMUNITY)
Admission: EM | Admit: 2017-11-05 | Discharge: 2017-11-05 | Disposition: A | Discharge status: Home / Self Care | Payer: Self-pay | Attending: Internal Medicine | Admitting: Internal Medicine

## 2017-11-05 ENCOUNTER — Encounter (HOSPITAL_COMMUNITY): Payer: Self-pay | Admitting: Emergency Medicine

## 2017-11-05 ENCOUNTER — Ambulatory Visit (INDEPENDENT_AMBULATORY_CARE_PROVIDER_SITE_OTHER): Payer: Self-pay

## 2017-11-05 ENCOUNTER — Ambulatory Visit: Payer: Medicaid Other | Admitting: Family Medicine

## 2017-11-05 DIAGNOSIS — R05 Cough: Secondary | ICD-10-CM

## 2017-11-05 DIAGNOSIS — R059 Cough, unspecified: Secondary | ICD-10-CM

## 2017-11-05 DIAGNOSIS — J181 Lobar pneumonia, unspecified organism: Secondary | ICD-10-CM

## 2017-11-05 DIAGNOSIS — J189 Pneumonia, unspecified organism: Secondary | ICD-10-CM

## 2017-11-05 MED ORDER — LEVOFLOXACIN 750 MG PO TABS: 750.0000 mg | ORAL_TABLET | Freq: Every day | ORAL | 0 refills | Status: DC

## 2017-11-05 MED ORDER — IPRATROPIUM-ALBUTEROL 0.5-2.5 (3) MG/3ML IN SOLN
3.0000 mL | Freq: Once | RESPIRATORY_TRACT | Status: AC
Start: 2017-11-05 — End: 2017-11-05
  Administered 2017-11-05: 3 mL via RESPIRATORY_TRACT

## 2017-11-05 MED ORDER — METHYLPREDNISOLONE ACETATE 80 MG/ML IJ SUSP
INTRAMUSCULAR | Status: AC
  Filled 2017-11-05: qty 1

## 2017-11-05 MED ORDER — METHYLPREDNISOLONE SODIUM SUCC 125 MG IJ SOLR
80.0000 mg | Freq: Once | INTRAMUSCULAR | Status: AC
Start: 2017-11-05 — End: 2017-11-05
  Administered 2017-11-05: 80 mg via INTRAMUSCULAR

## 2017-11-05 MED ORDER — BENZONATATE 100 MG PO CAPS: 100.0000 mg | ORAL_CAPSULE | Freq: Every day | ORAL | 0 refills | Status: DC

## 2017-11-05 MED ORDER — IPRATROPIUM-ALBUTEROL 0.5-2.5 (3) MG/3ML IN SOLN
RESPIRATORY_TRACT | Status: AC
  Filled 2017-11-05: qty 3

## 2017-11-05 MED FILL — levoFLOXacin 750 MG TABS: 750 | 5 days supply | Qty: 5 | Fill #0

## 2017-11-05 MED FILL — BENZONATATE 100 MG CAP: 100 | 12 days supply | Qty: 12 | Fill #0

## 2017-11-05 NOTE — ED Notes (Signed)
Pt was seen by her PCP last week, had xray done and dx with pneumonia and given zithromax. C/o ongoing coughing and wheezing.

## 2017-11-05 NOTE — ED Provider Notes (Signed)
[facility]   [id number] 11/05/17 Arrival Time: 1307  SUBJECTIVE:  Crystal Burns is a 37 y.o. female hx significant for asthma, and HTN who presents with wheezing, SOB, and PND for a few weeks.  Was seen by her PCP last week and diagnosed with PNA.  She was treated with z-pak.  Denies positive sick exposure or precipitating event.  Describes cough as intermittent and productive with clear sputum.  Has tried OTC allergy medications, flonase, and azithromycin without relief.  Symptoms are made worse at night.  Complains of chills, fatigue, ears popping, hoarseness, and chest congestion.  Denies fever, rhinorrhea, sore throat, chest pain, nausea, changes in bowel or bladder habits.    ROS: As per HPI.  Past Medical History:  Diagnosis Date  . Asthma    2010-only bothers pt during bad cold  . Atypical chest pain 01/16/2016   Atypical chest pain  . Depression    2010-had when mother passed away  . History of gonorrhea   . Hypertension    2004  . Obesity   . Preeclampsia complicating hypertension 01/21/2015   Past Surgical History:  Procedure Laterality Date  . CHOLECYSTECTOMY    . DILATION AND CURETTAGE OF UTERUS    . WISDOM TOOTH EXTRACTION     Allergies  Allergen Reactions  . Bee Venom Hives and Swelling  . Lisinopril Shortness Of Breath and Cough  . Shellfish Allergy Anaphylaxis   No current facility-administered medications on file prior to encounter.    Current Outpatient Medications on File Prior to Encounter  Medication Sig Dispense Refill  . albuterol (PROVENTIL HFA;VENTOLIN HFA) 108 (90 Base) MCG/ACT inhaler Inhale 2 puffs into the lungs every 4 (four) hours as needed for wheezing or shortness of breath (cough, shortness of breath or wheezing.). 1 Inhaler 11  . albuterol (PROVENTIL) (2.5 MG/3ML) 0.083% nebulizer solution Take 3 mLs (2.5 mg total) by nebulization every 6 (six) hours as needed for wheezing or shortness of breath. 75 mL 12  . azithromycin  (ZITHROMAX) 250 MG tablet Take 2 today then 1 daily 6 tablet 0  . cetirizine (ZYRTEC) 10 MG tablet Take 1 tablet (10 mg total) by mouth daily. 30 tablet 11  . fluticasone (FLONASE) 50 MCG/ACT nasal spray Place 2 sprays into both nostrils daily. 16 g 3  . hydrochlorothiazide (MICROZIDE) 12.5 MG capsule Take 1 capsule (12.5 mg total) by mouth daily. 90 capsule 3  . losartan (COZAAR) 25 MG tablet Take 1 tablet (25 mg total) by mouth daily. 90 tablet 3  . omeprazole (PRILOSEC) 20 MG capsule Take 20 mg by mouth daily.    . Vitamin D, Ergocalciferol, (DRISDOL) 50000 units CAPS capsule Take 1 capsule (50,000 Units total) by mouth every 7 (seven) days. 12 capsule 1    Social History   Socioeconomic History  . Marital status: Single    Spouse name: Not on file  . Number of children: Not on file  . Years of education: Not on file  . Highest education level: Not on file  Occupational History  . Not on file  Social Needs  . Financial resource strain: Not on file  . Food insecurity:    Worry: Not on file    Inability: Not on file  . Transportation needs:    Medical: Not on file    Non-medical: Not on file  Tobacco Use  . Smoking status: Former Smoker    Types: Cigarettes    Last attempt to quit: 10/31/2010  Years since quitting: 7.0  . Smokeless tobacco: Never Used  Substance and Sexual Activity  . Alcohol use: No    Alcohol/week: 0.0 oz  . Drug use: No  . Sexual activity: Not on file  Lifestyle  . Physical activity:    Days per week: Not on file    Minutes per session: Not on file  . Stress: Not on file  Relationships  . Social connections:    Talks on phone: Not on file    Gets together: Not on file    Attends religious service: Not on file    Active member of club or organization: Not on file    Attends meetings of clubs or organizations: Not on file    Relationship status: Not on file  . Intimate partner violence:    Fear of current or ex partner: Not on file     Emotionally abused: Not on file    Physically abused: Not on file    Forced sexual activity: Not on file  Other Topics Concern  . Not on file  Social History Narrative  . Not on file   Family History  Problem Relation Age of Onset  . Hypertension Mother   . Diabetes Mother   . Cancer Mother   . Hypertension Father   . Diabetes Father   . Hyperlipidemia Father   . Arthritis Unknown   . Asthma Unknown   . Breast cancer Unknown   . Heart failure Unknown   . Congenital heart disease Unknown   . Depression Unknown   . Heart attack Unknown      OBJECTIVE:  Vitals:   11/05/17 1313  BP: 132/77  Pulse: 80  Resp: 16  Temp: 97.9 F (36.6 C)  SpO2: 98%     General appearance: AOx3 in no acute distress HEENT: Ears: EACs clear, TM pearly gray with visible landmarks; Eyes: PERRL.  EOM grossly intact.  Sinuses nontender; mild clear rhinorrhea; tonsils nonerythematous, uvula midline Neck: supple without LAD Lungs: Wheezing appreciated over the left lower lobe Heart: regular rate and rhythm.  Radial pulses 2+ symmetrical bilaterally Skin: warm and dry Psychological: alert and cooperative; normal mood and affect  DIAGNOSTIC STUDIES:  Dg Chest 2 View  Result Date: 11/05/2017 CLINICAL DATA:  Cough with shortness of breath EXAM: CHEST - 2 VIEW COMPARISON:  10/23/2017, 07/11/2015 FINDINGS: Streaky bibasilar opacity. No interval consolidation. No pleural effusion. Normal heart size. No pneumothorax. IMPRESSION: Mild streaky bibasilar opacity suspect for small infiltrates, not significantly changed on the left, slight increase at the right base. Electronically Signed   By: Jasmine Pang M.D.   On: 11/05/2017 14:18   Dg Chest 2 View  Result Date: 10/24/2017 CLINICAL DATA:  Hoarseness for 3 weeks. History of asthma. Former smoker. EXAM: CHEST - 2 VIEW COMPARISON:  07/11/2015 FINDINGS: The heart size and mediastinal contours are within normal limits. No pleural effusion or edema. There is  subtle asymmetric opacity within the left lower lobe suspicious for mild pneumonia. Both lungs are clear. The visualized skeletal structures are unremarkable. IMPRESSION: 1. Suspect early/mild pneumonia within the left lower lobe. Electronically Signed   By: Signa Kell M.D.   On: 10/24/2017 09:50     ASSESSMENT & PLAN:  1. Cough   2. Pneumonia of left lower lobe due to infectious organism [facility])     Meds ordered this encounter  Medications  . levofloxacin (LEVAQUIN) 750 MG tablet    Sig: Take 1 tablet (750 mg total) by mouth  daily.    Dispense:  5 tablet    Refill:  0    Order Specific Question:   Supervising Provider    Answer:   Isa RankinMURRAY, LAURA WILSON [[id number]]  . benzonatate (TESSALON) 100 MG capsule    Sig: Take 1 capsule (100 mg total) by mouth at bedtime.    Dispense:  12 capsule    Refill:  0    Order Specific Question:   Supervising Provider    Answer:   Isa RankinMURRAY, LAURA WILSON [phone][[id number]]  . ipratropium-albuterol (DUONEB) 0.5-2.5 (3) MG/3ML nebulizer solution 3 mL   Patient requests steroid shot.   Duo-neb treatment given in office Chest x-ray showed signs of pneumonia Prescribed levofloxacin 750mg  daily for 5 days Take as directed and to completion Prescribed tessalone perles as needed for symptomatic relief of cough at night Continue OTC medications as needed for symptomatic relief Follow up with PCP next week for reevaluation of symptoms Return or go to the ED if you experience any new or worsening symptoms  Reviewed expectations re: course of current medical issues. Questions answered. Outlined signs and symptoms indicating need for more acute intervention. Patient verbalized understanding. After Visit Summary given.          Rennis HardingWurst, Anson Peddie, PA-C 11/05/17 1523

## 2017-11-05 NOTE — Discharge Instructions (Addendum)
Duo-neb treatment given in office Chest x-ray showed signs of pneumonia Prescribed levofloxacin 750mg  daily for 5 days Take as directed and to completion Prescribed tessalone perles as needed for symptomatic relief of cough at night Continue OTC medications as needed for symptomatic relief Follow up with PCP next week for reevaluation of symptoms Return or go to the ED if you experience any new or worsening symptoms

## 2017-11-11 ENCOUNTER — Ambulatory Visit (INDEPENDENT_AMBULATORY_CARE_PROVIDER_SITE_OTHER): Payer: Medicaid Other

## 2017-11-11 ENCOUNTER — Ambulatory Visit (HOSPITAL_COMMUNITY)
Admission: EM | Admit: 2017-11-11 | Discharge: 2017-11-11 | Disposition: A | Discharge status: Home / Self Care | Payer: Medicaid Other | Attending: Family Medicine | Admitting: Family Medicine

## 2017-11-11 ENCOUNTER — Other Ambulatory Visit: Payer: Self-pay

## 2017-11-11 ENCOUNTER — Encounter (HOSPITAL_COMMUNITY): Payer: Self-pay

## 2017-11-11 DIAGNOSIS — Z9049 Acquired absence of other specified parts of digestive tract: Secondary | ICD-10-CM | POA: Insufficient documentation

## 2017-11-11 DIAGNOSIS — F329 Major depressive disorder, single episode, unspecified: Secondary | ICD-10-CM | POA: Insufficient documentation

## 2017-11-11 DIAGNOSIS — R05 Cough: Secondary | ICD-10-CM

## 2017-11-11 DIAGNOSIS — E559 Vitamin D deficiency, unspecified: Secondary | ICD-10-CM | POA: Insufficient documentation

## 2017-11-11 DIAGNOSIS — M1711 Unilateral primary osteoarthritis, right knee: Secondary | ICD-10-CM | POA: Insufficient documentation

## 2017-11-11 DIAGNOSIS — Z7952 Long term (current) use of systemic steroids: Secondary | ICD-10-CM | POA: Insufficient documentation

## 2017-11-11 DIAGNOSIS — I10 Essential (primary) hypertension: Secondary | ICD-10-CM | POA: Insufficient documentation

## 2017-11-11 DIAGNOSIS — R51 Headache: Secondary | ICD-10-CM

## 2017-11-11 DIAGNOSIS — J4521 Mild intermittent asthma with (acute) exacerbation: Secondary | ICD-10-CM

## 2017-11-11 DIAGNOSIS — F411 Generalized anxiety disorder: Secondary | ICD-10-CM | POA: Insufficient documentation

## 2017-11-11 DIAGNOSIS — M791 Myalgia, unspecified site: Secondary | ICD-10-CM

## 2017-11-11 DIAGNOSIS — Z79899 Other long term (current) drug therapy: Secondary | ICD-10-CM | POA: Insufficient documentation

## 2017-11-11 DIAGNOSIS — Z8249 Family history of ischemic heart disease and other diseases of the circulatory system: Secondary | ICD-10-CM | POA: Insufficient documentation

## 2017-11-11 DIAGNOSIS — Z6841 Body Mass Index (BMI) 40.0 and over, adult: Secondary | ICD-10-CM | POA: Insufficient documentation

## 2017-11-11 DIAGNOSIS — J189 Pneumonia, unspecified organism: Secondary | ICD-10-CM | POA: Insufficient documentation

## 2017-11-11 DIAGNOSIS — Z87891 Personal history of nicotine dependence: Secondary | ICD-10-CM | POA: Insufficient documentation

## 2017-11-11 DIAGNOSIS — Z888 Allergy status to other drugs, medicaments and biological substances status: Secondary | ICD-10-CM | POA: Insufficient documentation

## 2017-11-11 DIAGNOSIS — J329 Chronic sinusitis, unspecified: Secondary | ICD-10-CM | POA: Insufficient documentation

## 2017-11-11 LAB — CBC WITH DIFFERENTIAL/PLATELET
Abs Immature Granulocytes: 0 10*3/uL (ref 0.0–0.1)
BASOS ABS: 0 10*3/uL (ref 0.0–0.1)
Basophils Relative: 0 %
EOS ABS: 0.4 10*3/uL (ref 0.0–0.7)
EOS PCT: 5 %
HCT: 36.1 % (ref 36.0–46.0)
Hemoglobin: 10.8 g/dL — ABNORMAL LOW (ref 12.0–15.0)
IMMATURE GRANULOCYTES: 1 %
Lymphocytes Relative: 26 %
Lymphs Abs: 2 10*3/uL (ref 0.7–4.0)
MCH: 24.7 pg — ABNORMAL LOW (ref 26.0–34.0)
MCHC: 29.9 g/dL — AB (ref 30.0–36.0)
MCV: 82.6 fL (ref 78.0–100.0)
Monocytes Absolute: 0.4 10*3/uL (ref 0.1–1.0)
Monocytes Relative: 5 %
NEUTROS PCT: 63 %
Neutro Abs: 5.1 10*3/uL (ref 1.7–7.7)
Platelets: 308 10*3/uL (ref 150–400)
RBC: 4.37 MIL/uL (ref 3.87–5.11)
RDW: 14.7 % (ref 11.5–15.5)
WBC: 7.9 10*3/uL (ref 4.0–10.5)

## 2017-11-11 MED ORDER — IPRATROPIUM-ALBUTEROL 0.5-2.5 (3) MG/3ML IN SOLN
RESPIRATORY_TRACT | Status: AC
  Filled 2017-11-11: qty 3

## 2017-11-11 MED ORDER — METHYLPREDNISOLONE SODIUM SUCC 125 MG IJ SOLR: 80.0000 mg | Freq: Once | INTRAMUSCULAR | Status: DC

## 2017-11-11 MED ORDER — METHYLPREDNISOLONE SODIUM SUCC 125 MG IJ SOLR
INTRAMUSCULAR | Status: AC
  Filled 2017-11-11: qty 2

## 2017-11-11 MED ORDER — IPRATROPIUM-ALBUTEROL 0.5-2.5 (3) MG/3ML IN SOLN
3.0000 mL | Freq: Once | RESPIRATORY_TRACT | Status: AC
  Administered 2017-11-11: 3 mL via RESPIRATORY_TRACT

## 2017-11-11 MED ORDER — DM-GUAIFENESIN ER 30-600 MG PO TB12: 1.0000 | ORAL_TABLET | Freq: Two times a day (BID) | ORAL | 0 refills | Status: DC

## 2017-11-11 MED ORDER — PREDNISONE 20 MG PO TABS: 20.0000 mg | ORAL_TABLET | Freq: Two times a day (BID) | ORAL | 0 refills | Status: DC

## 2017-11-11 MED ORDER — BENZONATATE 200 MG PO CAPS: 200.0000 mg | ORAL_CAPSULE | Freq: Two times a day (BID) | ORAL | 0 refills | Status: DC | PRN

## 2017-11-11 MED FILL — LOSARTAN POTASSIUM 25 MG TA: 25 | 30 days supply | Qty: 30 | Fill #3

## 2017-11-11 MED FILL — HYDROCHLOROTHIAZIDE 12.5 MG: 12.5 | 30 days supply | Qty: 30 | Fill #3

## 2017-11-11 NOTE — ED Triage Notes (Signed)
Pt is getting over pneumonia. Pt still has a cough. Body aches and pain .

## 2017-11-11 NOTE — Discharge Instructions (Signed)
Drink plenty of fluids Run humidifier in your bedroom Take prednisone twice a day for 5 days Continue using nebulizer every 4-6 hours as needed for wheezing ,use inhaler if you are away from home May take Tessalon twice a day for coughing Take Mucinex DM (generic) twice a day as well Follow-up with your PCP next week

## 2017-11-11 NOTE — ED Provider Notes (Signed)
[facility]    CSN: [id number] Arrival date & time: 11/11/17  1449     History   Chief Complaint Chief Complaint  Patient presents with  . Cough    HPI Crystal Burns is a 37 y.o. female.   HPI  Here for follow up Has been diagnosed with pneumonia on 10/28/17 by her PCP and treated with a Z pak patient states she took this as prescribed.  After completing the antibiotic she still felt back, body aches, chills, cough and sputum.  She came here to the urgent care center and was evaluated.  Her x-ray showed worsening of her pneumonia.  She was given Levaquin daily for 5 days.  She is given Tessalon for coughing.  She has completed this antibiotic canal, and states that she still feels no better.  She has increased wheezing.  Shortness of breath.  Coughing.  Coughing up sputum.  Feels exhausted.  No pain in chest or pain with deep breath.  She has a sore throat for the last 2 days.  Headache intermittently.  She states she is eating and drinking normally.  She uses the albuterol inhaler or nebulizer daily as needed.  She has not used it since this morning when she got up.  She is quite short of breath now. I explained to her that the cough from pneumonia does not clear up immediately upon completion of the antibiotics.  I discussed managing her cough, and her asthma to make her feel better.  She insists she needs another chest x-ray because her pneumonia is not gone.  I explained to her that the x-ray findings of pneumonia will persist even after the antibiotics are completed, and that it may not be helpful at this point.  She feels like she has an infection that is inadequately treated and diagnosed, and wants additional testing.   Past Medical History:  Diagnosis Date  . Asthma    2010-only bothers pt during bad cold  . Atypical chest pain 01/16/2016   Atypical chest pain  . Depression    2010-had when mother passed away  . History of gonorrhea   . Hypertension    2004    . Obesity   . Preeclampsia complicating hypertension 01/21/2015    Patient Active Problem List   Diagnosis Date Noted  . Acute medial meniscus tear of right knee 02/10/2017  . Chronic sinusitis 07/23/2016  . Vitamin D deficiency 07/10/2015  . Anxiety state 06/20/2015  . Costochondritis 05/18/2015  . Primary osteoarthritis of right knee 04/22/2014  . Essential hypertension 07/18/2012  . LUMBOSACRAL STRAIN, ACUTE 07/05/2008  . Morbid obesity (HCC) 02/24/2008  . Asthma with exacerbation 02/24/2008    Past Surgical History:  Procedure Laterality Date  . CHOLECYSTECTOMY    . DILATION AND CURETTAGE OF UTERUS    . WISDOM TOOTH EXTRACTION      OB History    Gravida  11   Para  7   Term  7   Preterm  0   AB  4   Living  6     SAB  4   TAB  0   Ectopic  0   Multiple  0   Live Births  7            Home Medications    Prior to Admission medications   Medication Sig Start Date End Date Taking? Authorizing Provider  albuterol (PROVENTIL HFA;VENTOLIN HFA) 108 (90 Base) MCG/ACT inhaler Inhale 2 puffs into the  lungs every 4 (four) hours as needed for wheezing or shortness of breath (cough, shortness of breath or wheezing.). 08/07/17   Elvina Sidle, MD  albuterol (PROVENTIL) (2.5 MG/3ML) 0.083% nebulizer solution Take 3 mLs (2.5 mg total) by nebulization every 6 (six) hours as needed for wheezing or shortness of breath. 08/07/17   Elvina Sidle, MD  benzonatate (TESSALON) 200 MG capsule Take 1 capsule (200 mg total) by mouth 2 (two) times daily as needed for cough. 11/11/17   Eustace Moore, MD  cetirizine (ZYRTEC) 10 MG tablet Take 1 tablet (10 mg total) by mouth daily. 10/23/17   Anders Simmonds, PA-C  dextromethorphan-guaiFENesin (MUCINEX DM) 30-600 MG 12hr tablet Take 1 tablet by mouth 2 (two) times daily. 11/11/17   Eustace Moore, MD  fluticasone [facility]) 50 MCG/ACT nasal spray Place 2 sprays into both nostrils daily. 10/23/17   Anders Simmonds, PA-C   hydrochlorothiazide (MICROZIDE) 12.5 MG capsule Take 1 capsule (12.5 mg total) by mouth daily. 08/07/17   Elvina Sidle, MD  losartan (COZAAR) 25 MG tablet Take 1 tablet (25 mg total) by mouth daily. 08/07/17   Elvina Sidle, MD  omeprazole (PRILOSEC) 20 MG capsule Take 20 mg by mouth daily.    [provider]  predniSONE (DELTASONE) 20 MG tablet Take 1 tablet (20 mg total) by mouth 2 (two) times daily with a meal. 11/11/17   Eustace Moore, MD  Vitamin D, Ergocalciferol, (DRISDOL) 50000 units CAPS capsule Take 1 capsule (50,000 Units total) by mouth every 7 (seven) days. 05/08/17   Claiborne Rigg, NP    Family History Family History  Problem Relation Age of Onset  . Hypertension Mother   . Diabetes Mother   . Cancer Mother   . Hypertension Father   . Diabetes Father   . Hyperlipidemia Father   . Arthritis Unknown   . Asthma Unknown   . Breast cancer Unknown   . Heart failure Unknown   . Congenital heart disease Unknown   . Depression Unknown   . Heart attack Unknown     Social History Social History   Tobacco Use  . Smoking status: Former Smoker    Types: Cigarettes    Last attempt to quit: 10/31/2010    Years since quitting: 7.0  . Smokeless tobacco: Never Used  Substance Use Topics  . Alcohol use: No    Alcohol/week: 0.0 oz  . Drug use: No     Allergies   Bee venom; Lisinopril; and Shellfish allergy   Review of Systems Review of Systems  Constitutional: Positive for fatigue. Negative for activity change, appetite change, chills and fever.  HENT: Positive for sore throat. Negative for dental problem, ear pain, postnasal drip and rhinorrhea.   Eyes: Negative for pain and visual disturbance.  Respiratory: Positive for cough, chest tightness, shortness of breath and wheezing.   Cardiovascular: Negative for chest pain and palpitations.  Gastrointestinal: Negative for abdominal pain, diarrhea, nausea and vomiting.  Genitourinary: Negative for dysuria  and hematuria.  Musculoskeletal: Positive for myalgias. Negative for arthralgias and back pain.  Skin: Negative for color change and rash.  Neurological: Positive for headaches. Negative for dizziness, seizures and syncope.  All other systems reviewed and are negative.    Physical Exam Triage Vital Signs ED Triage Vitals [11/11/17 1556]  Enc Vitals Group     BP      Pulse      Resp      Temp  Temp src      SpO2      Weight 292 lb (132.5 kg)     Height      Head Circumference      Peak Flow      Pain Score      Pain Loc      Pain Edu?      Excl. in GC?    No data found.  Updated Vital Signs BP 135/77 (BP Location: Left Arm)   Pulse 78   Temp 98.5 F (36.9 C) (Oral)   Resp 18   Wt 292 lb (132.5 kg)   SpO2 100%   BMI 51.73 kg/m      Physical Exam  Constitutional: She appears well-developed and well-nourished. No distress.  HENT:  Head: Normocephalic and atraumatic.  Right Ear: External ear normal.  Left Ear: External ear normal.  Mouth/Throat: Oropharynx is clear and moist.  Eyes: Pupils are equal, round, and reactive to light. Conjunctivae are normal.  Neck: Normal range of motion.  Cardiovascular: Normal rate, regular rhythm and normal heart sounds.  Pulmonary/Chest: Effort normal. No respiratory distress. She has wheezes.  Scattered inspiratory wheeze throughout.  Diminished breath effort.  Abdominal: Soft. She exhibits no distension.  Musculoskeletal: Normal range of motion. She exhibits no edema.  Lymphadenopathy:    She has no cervical adenopathy.  Neurological: She is alert.  Skin: Skin is warm and dry.  Psychiatric: She has a normal mood and affect. Her behavior is normal.     UC Treatments / Results  Labs (all labs ordered are listed, but only abnormal results are displayed) Labs Reviewed  CBC WITH DIFFERENTIAL/PLATELET    EKG None  Radiology Dg Chest 2 View  Result Date: 11/11/2017 CLINICAL DATA:  Follow-up pneumonia. EXAM: CHEST -  2 VIEW COMPARISON:  11/05/2017. FINDINGS: Mediastinum and hilar structures normal. Heart size normal. Persistent mild bibasilar subsegmental atelectasis/infiltrate. No significant change. A component of scarring may also present this fashion. No pleural effusion or pneumothorax. IMPRESSION: Persistent mild bibasilar subsegmental atelectasis/infiltrate. No significant change. A component of scarring may also present this fashion. Electronically Signed   By: Maisie Fus  Register   On: 11/11/2017 16:53  To my review, the chest x-ray is a little bit better.  It looks more like atelectasis than infiltrate.  Her lungs are clear after the nebulizer treatment.  I am going to treat her as if she has an asthma flare. Procedures Procedures (including critical care time)  Medications Ordered in UC Medications  methylPREDNISolone sodium succinate (SOLU-MEDROL) 125 mg/2 mL injection 80 mg (has no administration in time range)  ipratropium-albuterol (DUONEB) 0.5-2.5 (3) MG/3ML nebulizer solution 3 mL (3 mLs Nebulization Given 11/11/17 1632)    Initial Impression / Assessment and Plan / UC Course  I have reviewed the triage vital signs and the nursing notes.  Pertinent labs & imaging results that were available during my care of the patient were reviewed by me and considered in my medical decision making (see chart for details).     Final Clinical Impressions(s) / UC Diagnoses   Final diagnoses:  Mild intermittent asthma with exacerbation     Discharge Instructions     Drink plenty of fluids Run humidifier in your bedroom Take prednisone twice a day for 5 days Continue using nebulizer every 4-6 hours as needed for wheezing ,use inhaler if you are away from home May take Tessalon twice a day for coughing Take Mucinex DM (generic) twice a day as well Follow-up with  your PCP next week      ED Prescriptions    Medication Sig Dispense Auth. Provider   predniSONE (DELTASONE) 20 MG tablet Take 1 tablet  (20 mg total) by mouth 2 (two) times daily with a meal. 10 tablet Eustace MooreNelson, Yvonne Sue, MD   dextromethorphan-guaiFENesin [facility](MUCINEX DM) 30-600 MG 12hr tablet Take 1 tablet by mouth 2 (two) times daily. 28 tablet Eustace MooreNelson, Yvonne Sue, MD   benzonatate (TESSALON) 200 MG capsule Take 1 capsule (200 mg total) by mouth 2 (two) times daily as needed for cough. 20 capsule Eustace MooreNelson, Yvonne Sue, MD     Controlled Substance Prescriptions [place] Controlled Substance Registry consulted? Not Applicable   Eustace MooreNelson, Yvonne Sue, MD 11/11/17 [phone]

## 2017-11-12 MED FILL — predniSONE 20 MG TABS: 20 | 5 days supply | Qty: 10 | Fill #0

## 2017-11-14 ENCOUNTER — Telehealth: Payer: Self-pay

## 2017-11-14 NOTE — Telephone Encounter (Signed)
Patient was called and informed that paperwork is ready for pick up. Patient asked for CMA to fax paperwork on her behalf.

## 2017-12-04 ENCOUNTER — Ambulatory Visit: Payer: Medicaid Other | Admitting: Family Medicine

## 2017-12-11 MED FILL — HYDROCHLOROTHIAZIDE 12.5 MG: 12.5 | 30 days supply | Qty: 30 | Fill #4

## 2017-12-11 MED FILL — LOSARTAN POTASSIUM 25 MG TA: 25 | 30 days supply | Qty: 30 | Fill #4

## 2017-12-15 DIAGNOSIS — Z3490 Encounter for supervision of normal pregnancy, unspecified, unspecified trimester: Secondary | ICD-10-CM | POA: Insufficient documentation

## 2017-12-16 ENCOUNTER — Ambulatory Visit: Payer: Medicaid Other | Admitting: Obstetrics

## 2017-12-22 ENCOUNTER — Ambulatory Visit: Payer: Medicaid Other | Admitting: Family Medicine

## 2018-01-13 ENCOUNTER — Ambulatory Visit (HOSPITAL_COMMUNITY)
Admission: EM | Admit: 2018-01-13 | Discharge: 2018-01-13 | Disposition: A | Discharge status: Home / Self Care | Payer: Self-pay | Attending: Family Medicine | Admitting: Family Medicine

## 2018-01-13 ENCOUNTER — Encounter (HOSPITAL_COMMUNITY): Payer: Self-pay | Admitting: Emergency Medicine

## 2018-01-13 ENCOUNTER — Ambulatory Visit: Payer: Medicaid Other

## 2018-01-13 DIAGNOSIS — R490 Dysphonia: Secondary | ICD-10-CM

## 2018-01-13 MED ORDER — METHYLPREDNISOLONE 4 MG PO TABS: 4.0000 mg | ORAL_TABLET | Freq: Two times a day (BID) | ORAL | 1 refills | Status: DC

## 2018-01-13 MED FILL — METHYLPREDNISOLONE 4 MG TAB: 4 | 5 days supply | Qty: 10 | Fill #0

## 2018-01-13 NOTE — ED Triage Notes (Signed)
Pt sts URI sx x 1 week; pt sts intermittent hoarseness since having PNA

## 2018-01-13 NOTE — ED Provider Notes (Signed)
[facility]    CSN: [id number] Arrival date & time: 01/13/18  1235     History   Chief Complaint Chief Complaint  Patient presents with  . URI    HPI Crystal Burns is a 37 y.o. female.   This patient has had a hoarseness for over 3 months.  Along with that she was diagnosed with pneumonia which seems to have cleared.  Nevertheless the hoarseness persists.  Patient has several children at home who have been sick this entire time.  She thinks that she may have picked up different infections from each of them at various times.  She does not have a fever and has not coughed up any blood.  Patient has a remote history of smoking.  Patient notes that her job requires that she said underneath event which makes her chills at times.     Past Medical History:  Diagnosis Date  . Asthma    2010-only bothers pt during bad cold  . Atypical chest pain 01/16/2016   Atypical chest pain  . Depression    2010-had when mother passed away  . History of gonorrhea   . Hypertension    2004  . Obesity   . Preeclampsia complicating hypertension 01/21/2015    Patient Active Problem List   Diagnosis Date Noted  . Supervision of normal pregnancy, antepartum 12/15/2017  . Acute medial meniscus tear of right knee 02/10/2017  . Chronic sinusitis 07/23/2016  . Vitamin D deficiency 07/10/2015  . Anxiety state 06/20/2015  . Costochondritis 05/18/2015  . Primary osteoarthritis of right knee 04/22/2014  . Essential hypertension 07/18/2012  . LUMBOSACRAL STRAIN, ACUTE 07/05/2008  . Morbid obesity (HCC) 02/24/2008  . Asthma with exacerbation 02/24/2008    Past Surgical History:  Procedure Laterality Date  . CHOLECYSTECTOMY    . DILATION AND CURETTAGE OF UTERUS    . WISDOM TOOTH EXTRACTION      OB History    Gravida  11   Para  7   Term  7   Preterm  0   AB  4   Living  6     SAB  4   TAB  0   Ectopic  0   Multiple  0   Live Births  7             Home Medications    Prior to Admission medications   Medication Sig Start Date End Date Taking? Authorizing Provider  albuterol (PROVENTIL HFA;VENTOLIN HFA) 108 (90 Base) MCG/ACT inhaler Inhale 2 puffs into the lungs every 4 (four) hours as needed for wheezing or shortness of breath (cough, shortness of breath or wheezing.). 08/07/17   Elvina Sidle, MD  albuterol (PROVENTIL) (2.5 MG/3ML) 0.083% nebulizer solution Take 3 mLs (2.5 mg total) by nebulization every 6 (six) hours as needed for wheezing or shortness of breath. 08/07/17   Elvina Sidle, MD  cetirizine (ZYRTEC) 10 MG tablet Take 1 tablet (10 mg total) by mouth daily. 10/23/17   Anders Simmonds, PA-C  fluticasone (FLONASE) 50 MCG/ACT nasal spray Place 2 sprays into both nostrils daily. 10/23/17   Anders Simmonds, PA-C  hydrochlorothiazide (MICROZIDE) 12.5 MG capsule Take 1 capsule (12.5 mg total) by mouth daily. 08/07/17   Elvina Sidle, MD  losartan (COZAAR) 25 MG tablet Take 1 tablet (25 mg total) by mouth daily. 08/07/17   Elvina Sidle, MD  methylPREDNISolone (MEDROL) 4 MG tablet Take 1 tablet (4 mg total) by mouth 2 (two) times daily.  01/13/18   Elvina Sidle, MD  omeprazole (PRILOSEC) 20 MG capsule Take 20 mg by mouth daily.    [provider]  Vitamin D, Ergocalciferol, (DRISDOL) 50000 units CAPS capsule Take 1 capsule (50,000 Units total) by mouth every 7 (seven) days. 05/08/17   Claiborne Rigg, NP    Family History Family History  Problem Relation Age of Onset  . Hypertension Mother   . Diabetes Mother   . Cancer Mother   . Hypertension Father   . Diabetes Father   . Hyperlipidemia Father   . Arthritis Unknown   . Asthma Unknown   . Breast cancer Unknown   . Heart failure Unknown   . Congenital heart disease Unknown   . Depression Unknown   . Heart attack Unknown     Social History Social History   Tobacco Use  . Smoking status: Former Smoker    Types: Cigarettes    Last attempt to  quit: 10/31/2010    Years since quitting: 7.2  . Smokeless tobacco: Never Used  Substance Use Topics  . Alcohol use: No    Alcohol/week: 0.0 standard drinks  . Drug use: No     Allergies   Bee venom; Lisinopril; and Shellfish allergy   Review of Systems Review of Systems  HENT: Positive for sore throat.        Hoarseness  Respiratory: Negative for cough.   All other systems reviewed and are negative.    Physical Exam Triage Vital Signs ED Triage Vitals [01/13/18 1302]  Enc Vitals Group     BP (!) 146/88     Pulse Rate 70     Resp 18     Temp 98.3 F (36.8 C)     Temp Source Oral     SpO2 97 %     Weight      Height      Head Circumference      Peak Flow      Pain Score 6     Pain Loc      Pain Edu?      Excl. in GC?    No data found.  Updated Vital Signs BP (!) 146/88 (BP Location: Left Arm)   Pulse 70   Temp 98.3 F (36.8 C) (Oral)   Resp 18   SpO2 97%    Physical Exam  Constitutional: She is oriented to person, place, and time. She appears well-developed and well-nourished.  HENT:  Right Ear: External ear normal.  Left Ear: External ear normal.  Mouth/Throat: Oropharynx is clear and moist.  Eyes: Pupils are equal, round, and reactive to light. Conjunctivae are normal.  Neck: Normal range of motion. Neck supple.  Cardiovascular: Normal rate, regular rhythm and normal heart sounds.  Pulmonary/Chest: Effort normal and breath sounds normal.  Musculoskeletal: Normal range of motion.  Neurological: She is alert and oriented to person, place, and time.  Skin: Skin is warm and dry.  Nursing note and vitals reviewed.    UC Treatments / Results  Labs (all labs ordered are listed, but only abnormal results are displayed) Labs Reviewed - No data to display  EKG None  Radiology No results found.  Procedures Procedures (including critical care time)  Medications Ordered in UC Medications - No data to display  Initial Impression / Assessment  and Plan / UC Course  I have reviewed the triage vital signs and the nursing notes.  Pertinent labs & imaging results that were available during my care of  the patient were reviewed by me and considered in my medical decision making (see chart for details).    Final Clinical Impressions(s) / UC Diagnoses   Final diagnoses:  Hoarseness of voice   Discharge Instructions   None    ED Prescriptions    Medication Sig Dispense Auth. Provider   methylPREDNISolone (MEDROL) 4 MG tablet Take 1 tablet (4 mg total) by mouth 2 (two) times daily. 10 tablet Elvina Sidle, MD     Controlled Substance Prescriptions [place] Controlled Substance Registry consulted? Not Applicable   Elvina Sidle, MD 01/13/18 1344

## 2018-01-14 MED FILL — LOSARTAN POTASSIUM 25 MG TA: 25 | 30 days supply | Qty: 30 | Fill #5

## 2018-01-14 MED FILL — HYDROCHLOROTHIAZIDE 12.5 MG: 12.5 | 30 days supply | Qty: 30 | Fill #5

## 2018-01-17 ENCOUNTER — Ambulatory Visit (HOSPITAL_COMMUNITY)
Admission: EM | Admit: 2018-01-17 | Discharge: 2018-01-17 | Disposition: A | Discharge status: Home / Self Care | Payer: Medicaid Other | Attending: Family Medicine | Admitting: Family Medicine

## 2018-01-17 ENCOUNTER — Encounter (HOSPITAL_COMMUNITY): Payer: Self-pay

## 2018-01-17 ENCOUNTER — Other Ambulatory Visit: Payer: Self-pay

## 2018-01-17 DIAGNOSIS — R05 Cough: Secondary | ICD-10-CM

## 2018-01-17 DIAGNOSIS — R490 Dysphonia: Secondary | ICD-10-CM

## 2018-01-17 DIAGNOSIS — R059 Cough, unspecified: Secondary | ICD-10-CM

## 2018-01-17 DIAGNOSIS — R0982 Postnasal drip: Secondary | ICD-10-CM

## 2018-01-17 MED ORDER — IPRATROPIUM BROMIDE 0.06 % NA SOLN: 2.0000 | Freq: Four times a day (QID) | NASAL | 0 refills | Status: DC

## 2018-01-17 MED ORDER — METHYLPREDNISOLONE SODIUM SUCC 125 MG IJ SOLR
INTRAMUSCULAR | Status: AC
  Filled 2018-01-17: qty 2

## 2018-01-17 MED ORDER — FLUTICASONE PROPIONATE 50 MCG/ACT NA SUSP: 2.0000 | Freq: Every day | NASAL | 0 refills | Status: DC

## 2018-01-17 MED ORDER — BENZONATATE 200 MG PO CAPS: 200.0000 mg | ORAL_CAPSULE | Freq: Three times a day (TID) | ORAL | 0 refills | Status: DC

## 2018-01-17 MED ORDER — METHYLPREDNISOLONE SODIUM SUCC 125 MG IJ SOLR
80.0000 mg | Freq: Once | INTRAMUSCULAR | Status: AC
  Administered 2018-01-17: 125 mg via INTRAMUSCULAR

## 2018-01-17 NOTE — ED Triage Notes (Signed)
Pt presents today with productive cough, SOB, wheezing, Sore throat, x 4 day. Has hx of asthma. Has tried inhaler, chloraseptic mouth spray, theraflu, and has had no relief.

## 2018-01-17 NOTE — Discharge Instructions (Signed)
Steroid injection in office today.  Start Tessalon for cough.  Please start Flonase and Atrovent nasal spray for nasal congestion/drainage.  Restart your allergy medicine.  You can use over the counter nasal saline rinse such as neti pot for nasal congestion. Keep hydrated, your urine should be clear to pale yellow in color.  As discussed, please follow-up with your nose and throat doctor for further evaluation of continued hoarseness.  Please make an appointment with your primary care doctor to reassess your asthma control.  For sore throat/cough try using a honey-based tea. Use 3 teaspoons of honey with juice squeezed from half lemon. Place shaved pieces of ginger into 1/2-1 cup of water and warm over stove top. Then mix the ingredients and repeat every 4 hours as needed.

## 2018-01-17 NOTE — ED Provider Notes (Signed)
[facility]    CSN: [id number] Arrival date & time: 01/17/18  1120     History   Chief Complaint Chief Complaint  Patient presents with  . Cough    HPI AMIYLAH Crystal Burns is a 37 y.o. female.   37 year old female with history of asthma comes in for continued URI symptoms. She was diagnosed with pneumonia 3 months ago, finished 2 courses of antibiotics with multiple CXR. States she has 6 children at home and they have been sick multiple times, which she thinks she catches. She was seen 4 days ago for continued hoarseness since 3 months ago and was given medrol dosepak without relief. She continues with productive cough, throat irritation, rhinorrhea, nasal congestion. She has intermittent wheezing, which she uses albuterol for. States due to coughing, feels short of breath. Had episode of hot and cold chills without known documented fever. Has continue albuterol inhaler, otc cold medication without relief. Former smoker. She was given ENT information to follow up during last visit.      Past Medical History:  Diagnosis Date  . Asthma    2010-only bothers pt during bad cold  . Atypical chest pain 01/16/2016   Atypical chest pain  . Depression    2010-had when mother passed away  . History of gonorrhea   . Hypertension    2004  . Obesity   . Preeclampsia complicating hypertension 01/21/2015    Patient Active Problem List   Diagnosis Date Noted  . Supervision of normal pregnancy, antepartum 12/15/2017  . Acute medial meniscus tear of right knee 02/10/2017  . Chronic sinusitis 07/23/2016  . Vitamin D deficiency 07/10/2015  . Anxiety state 06/20/2015  . Costochondritis 05/18/2015  . Primary osteoarthritis of right knee 04/22/2014  . Essential hypertension 07/18/2012  . LUMBOSACRAL STRAIN, ACUTE 07/05/2008  . Morbid obesity (HCC) 02/24/2008  . Asthma with exacerbation 02/24/2008    Past Surgical History:  Procedure Laterality Date  . CHOLECYSTECTOMY    .  DILATION AND CURETTAGE OF UTERUS    . WISDOM TOOTH EXTRACTION      OB History    Gravida  11   Para  7   Term  7   Preterm  0   AB  4   Living  6     SAB  4   TAB  0   Ectopic  0   Multiple  0   Live Births  7            Home Medications    Prior to Admission medications   Medication Sig Start Date End Date Taking? Authorizing Provider  albuterol (PROVENTIL HFA;VENTOLIN HFA) 108 (90 Base) MCG/ACT inhaler Inhale 2 puffs into the lungs every 4 (four) hours as needed for wheezing or shortness of breath (cough, shortness of breath or wheezing.). 08/07/17  Yes Elvina Sidle, MD  albuterol (PROVENTIL) (2.5 MG/3ML) 0.083% nebulizer solution Take 3 mLs (2.5 mg total) by nebulization every 6 (six) hours as needed for wheezing or shortness of breath. 08/07/17  Yes Elvina Sidle, MD  cetirizine (ZYRTEC) 10 MG tablet Take 1 tablet (10 mg total) by mouth daily. 10/23/17  Yes Georgian Co M, PA-C  hydrochlorothiazide (MICROZIDE) 12.5 MG capsule Take 1 capsule (12.5 mg total) by mouth daily. 08/07/17  Yes Elvina Sidle, MD  losartan (COZAAR) 25 MG tablet Take 1 tablet (25 mg total) by mouth daily. 08/07/17  Yes Elvina Sidle, MD  methylPREDNISolone (MEDROL) 4 MG tablet Take 1 tablet (4  mg total) by mouth 2 (two) times daily. 01/13/18  Yes Elvina Sidle, MD  omeprazole (PRILOSEC) 20 MG capsule Take 20 mg by mouth daily.   Yes [provider]  benzonatate (TESSALON) 200 MG capsule Take 1 capsule (200 mg total) by mouth every 8 (eight) hours. 01/17/18   Cathie Burns, Crystal V, PA-C  fluticasone (FLONASE) 50 MCG/ACT nasal spray Place 2 sprays into both nostrils daily. 01/17/18   Yu, Crystal V, PA-C  ipratropium (ATROVENT) 0.06 % nasal spray Place 2 sprays into both nostrils 4 (four) times daily. 01/17/18   Belinda Fisher, PA-C    Family History Family History  Problem Relation Age of Onset  . Hypertension Mother   . Diabetes Mother   . Cancer Mother   . Hypertension Father   .  Diabetes Father   . Hyperlipidemia Father   . Arthritis Unknown   . Asthma Unknown   . Breast cancer Unknown   . Heart failure Unknown   . Congenital heart disease Unknown   . Depression Unknown   . Heart attack Unknown     Social History Social History   Tobacco Use  . Smoking status: Former Smoker    Types: Cigarettes    Last attempt to quit: 10/31/2010    Years since quitting: 7.2  . Smokeless tobacco: Never Used  Substance Use Topics  . Alcohol use: No    Alcohol/week: 0.0 standard drinks  . Drug use: No     Allergies   Bee venom; Lisinopril; and Shellfish allergy   Review of Systems Review of Systems  Reason unable to perform ROS: See HPI as above.     Physical Exam Triage Vital Signs ED Triage Vitals  Enc Vitals Group     BP 01/17/18 1205 132/89     Pulse Rate 01/17/18 1205 67     Resp 01/17/18 1205 16     Temp 01/17/18 1205 97.9 F (36.6 C)     Temp Source 01/17/18 1205 Oral     SpO2 01/17/18 1205 96 %     Weight --      Height --      Head Circumference --      Peak Flow --      Pain Score 01/17/18 1210 0     Pain Loc --      Pain Edu? --      Excl. in GC? --    No data found.  Updated Vital Signs BP 132/89 (BP Location: Left Arm)   Pulse 67   Temp 97.9 F (36.6 C) (Oral)   Resp 16   SpO2 96%   Physical Exam  Constitutional: She is oriented to person, place, and time. She appears well-developed and well-nourished. No distress.  HENT:  Head: Normocephalic and atraumatic.  Right Ear: Tympanic membrane, external ear and ear canal normal. Tympanic membrane is not erythematous and not bulging.  Left Ear: Tympanic membrane, external ear and ear canal normal. Tympanic membrane is not erythematous and not bulging.  Nose: Rhinorrhea present. Right sinus exhibits no maxillary sinus tenderness and no frontal sinus tenderness. Left sinus exhibits no maxillary sinus tenderness and no frontal sinus tenderness.  Mouth/Throat: Uvula is midline,  oropharynx is clear and moist and mucous membranes are normal.  Eyes: Pupils are equal, round, and reactive to light. Conjunctivae are normal.  Neck: Normal range of motion. Neck supple.  Cardiovascular: Normal rate, regular rhythm and normal heart sounds. Exam reveals no gallop and no friction rub.  No murmur heard. Pulmonary/Chest: Effort normal and breath sounds normal. No accessory muscle usage or stridor. No respiratory distress. She has no decreased breath sounds. She has no wheezes. She has no rhonchi. She has no rales.  Speaking in full sentences without difficulty.   Lymphadenopathy:    She has no cervical adenopathy.  Neurological: She is alert and oriented to person, place, and time.  Skin: Skin is warm and dry.  Psychiatric: She has a normal mood and affect. Her behavior is normal. Judgment normal.     UC Treatments / Results  Labs (all labs ordered are listed, but only abnormal results are displayed) Labs Reviewed - No data to display  EKG None  Radiology No results found.  Procedures Procedures (including critical care time)  Medications Ordered in UC Medications  methylPREDNISolone sodium succinate (SOLU-MEDROL) 125 mg/2 mL injection 80 mg (125 mg Intramuscular Given 01/17/18 1251)    Initial Impression / Assessment and Plan / UC Course  I have reviewed the triage vital signs and the nursing notes.  Pertinent labs & imaging results that were available during my care of the patient were reviewed by me and considered in my medical decision making (see chart for details).    Long discussion with patient, no signs of bacterial infection. Lungs clear to auscultation bilaterally without adventitious lung sounds. Discussed possible allergic rhinitis/post nasal drip causing continued symptoms, or multiple viral illnesses given sick children at home. Will provide symptomatic treatment and have patient follow up with ENT for continued hoarseness. Patient also to follow up  with PCP for reevaluation and asthma control assessment. Return precautions given.  Patient requesting steroid course, given just finished course, will provide solumedrol injection in office.  Final Clinical Impressions(s) / UC Diagnoses   Final diagnoses:  Cough  Post-nasal drip  Hoarseness   ED Prescriptions    Medication Sig Dispense Auth. Provider   fluticasone (FLONASE) 50 MCG/ACT nasal spray Place 2 sprays into both nostrils daily. 1 g Yu, Crystal V, PA-C   ipratropium (ATROVENT) 0.06 % nasal spray Place 2 sprays into both nostrils 4 (four) times daily. 15 mL Yu, Crystal V, PA-C   benzonatate (TESSALON) 200 MG capsule Take 1 capsule (200 mg total) by mouth every 8 (eight) hours. 21 capsule Threasa Alpha, [place] 01/17/18 1259

## 2018-02-13 ENCOUNTER — Ambulatory Visit: Payer: Medicaid Other

## 2018-02-13 MED FILL — LOSARTAN POTASSIUM 25 MG TA: 25 | 30 days supply | Qty: 30 | Fill #6

## 2018-02-13 MED FILL — HYDROCHLOROTHIAZIDE 12.5 MG: 12.5 | 30 days supply | Qty: 30 | Fill #6

## 2018-03-02 ENCOUNTER — Encounter: Payer: Medicaid Other | Admitting: Family Medicine

## 2018-03-04 ENCOUNTER — Ambulatory Visit: Payer: Medicaid Other

## 2018-03-17 ENCOUNTER — Encounter: Payer: Self-pay | Admitting: Critical Care Medicine

## 2018-03-17 ENCOUNTER — Ambulatory Visit: Payer: Self-pay | Attending: Critical Care Medicine | Admitting: Critical Care Medicine

## 2018-03-17 VITALS — BP 144/88 | HR 71 | Temp 98.4°F | Resp 16 | Wt 310.0 lb

## 2018-03-17 DIAGNOSIS — Z79899 Other long term (current) drug therapy: Secondary | ICD-10-CM | POA: Insufficient documentation

## 2018-03-17 DIAGNOSIS — J4521 Mild intermittent asthma with (acute) exacerbation: Secondary | ICD-10-CM

## 2018-03-17 DIAGNOSIS — Z87891 Personal history of nicotine dependence: Secondary | ICD-10-CM | POA: Insufficient documentation

## 2018-03-17 DIAGNOSIS — Z91013 Allergy to seafood: Secondary | ICD-10-CM | POA: Insufficient documentation

## 2018-03-17 DIAGNOSIS — Z91030 Bee allergy status: Secondary | ICD-10-CM | POA: Insufficient documentation

## 2018-03-17 DIAGNOSIS — I10 Essential (primary) hypertension: Secondary | ICD-10-CM

## 2018-03-17 DIAGNOSIS — Z825 Family history of asthma and other chronic lower respiratory diseases: Secondary | ICD-10-CM | POA: Insufficient documentation

## 2018-03-17 DIAGNOSIS — Z888 Allergy status to other drugs, medicaments and biological substances status: Secondary | ICD-10-CM | POA: Insufficient documentation

## 2018-03-17 DIAGNOSIS — Z3202 Encounter for pregnancy test, result negative: Secondary | ICD-10-CM

## 2018-03-17 DIAGNOSIS — J45909 Unspecified asthma, uncomplicated: Secondary | ICD-10-CM | POA: Insufficient documentation

## 2018-03-17 DIAGNOSIS — Z833 Family history of diabetes mellitus: Secondary | ICD-10-CM | POA: Insufficient documentation

## 2018-03-17 DIAGNOSIS — K219 Gastro-esophageal reflux disease without esophagitis: Secondary | ICD-10-CM

## 2018-03-17 DIAGNOSIS — E669 Obesity, unspecified: Secondary | ICD-10-CM | POA: Insufficient documentation

## 2018-03-17 DIAGNOSIS — Z8249 Family history of ischemic heart disease and other diseases of the circulatory system: Secondary | ICD-10-CM | POA: Insufficient documentation

## 2018-03-17 DIAGNOSIS — R490 Dysphonia: Secondary | ICD-10-CM

## 2018-03-17 DIAGNOSIS — Z309 Encounter for contraceptive management, unspecified: Secondary | ICD-10-CM | POA: Insufficient documentation

## 2018-03-17 DIAGNOSIS — F329 Major depressive disorder, single episode, unspecified: Secondary | ICD-10-CM | POA: Insufficient documentation

## 2018-03-17 DIAGNOSIS — F419 Anxiety disorder, unspecified: Secondary | ICD-10-CM

## 2018-03-17 LAB — POCT URINE PREGNANCY: PREG TEST UR: NEGATIVE

## 2018-03-17 MED ORDER — FAMOTIDINE 20 MG PO TABS: 20.0000 mg | ORAL_TABLET | Freq: Every day | ORAL | 6 refills | Status: DC

## 2018-03-17 MED ORDER — HYDROCHLOROTHIAZIDE 25 MG PO TABS: 25.0000 mg | ORAL_TABLET | Freq: Every day | ORAL | 6 refills | Status: DC

## 2018-03-17 MED ORDER — LOSARTAN POTASSIUM 50 MG PO TABS: 50.0000 mg | ORAL_TABLET | Freq: Every day | ORAL | 11 refills | Status: DC

## 2018-03-17 MED ORDER — ALBUTEROL SULFATE HFA 108 (90 BASE) MCG/ACT IN AERS: 2.0000 | INHALATION_SPRAY | RESPIRATORY_TRACT | 3 refills | Status: DC | PRN

## 2018-03-17 MED ORDER — OMEPRAZOLE 20 MG PO CPDR: 20.0000 mg | DELAYED_RELEASE_CAPSULE | Freq: Every day | ORAL | 6 refills | Status: DC

## 2018-03-17 MED ORDER — MEDROXYPROGESTERONE ACETATE 150 MG/ML IM SUSP
150.0000 mg | Freq: Once | INTRAMUSCULAR | Status: AC
  Administered 2018-03-17: 150 mg via INTRAMUSCULAR

## 2018-03-17 MED ORDER — FLUTICASONE PROPIONATE 50 MCG/ACT NA SUSP: 2.0000 | Freq: Every day | NASAL | 6 refills | Status: DC

## 2018-03-17 MED FILL — ALBUTEROL SULFATE HFA 108 (: 108 (90 BAS | 16 days supply | Qty: 18 | Fill #0

## 2018-03-17 MED FILL — FAMOTIDINE 20 MG TABLET: 20 | 30 days supply | Qty: 30 | Fill #0

## 2018-03-17 MED FILL — HYDROCHLOROTHIAZIDE 25 MG T: 25 | 30 days supply | Qty: 30 | Fill #0

## 2018-03-17 MED FILL — LOSARTAN POTASSIUM 50 MG TA: 50 | 30 days supply | Qty: 30 | Fill #0

## 2018-03-17 MED FILL — HYDROCHLOROTHIAZIDE 12.5 MG: 12.5 | 30 days supply | Qty: 30 | Fill #7

## 2018-03-17 NOTE — Assessment & Plan Note (Signed)
Significant anxiety syndrome Referral to licensed clinical social work

## 2018-03-17 NOTE — Assessment & Plan Note (Addendum)
Essential hypertension which is poorly controlled  Plan Increase hydrochlorthiazide 25 mg daily Increase losartan to 50 mg daily DASHDiet was reviewed with the patient

## 2018-03-17 NOTE — Patient Instructions (Addendum)
Increase losartan to 50 mg daily Increase hydrochlorthiazide to 25 mg daily Continue omeprazole 20 mg daily Begin Pepcid 20 mg at bedtime Use Flonase 2 sprays each nostril daily Referral to licensed clinical social work will be made  Freight forwarder by minimizing voice use and keep a sugar-free candy drop in your mouth during the day and trying yourself to swallow instead of clearing her throat  Follow a reflux diet as below, and health choice diet for high blood pressure Hypertension Hypertension is another name for high blood pressure. High blood pressure forces your heart to work harder to pump blood. This can cause problems over time. There are two numbers in a blood pressure reading. There is a top number (systolic) over a bottom number (diastolic). It is best to have a blood pressure below 120/80. Healthy choices can help lower your blood pressure. You may need medicine to help lower your blood pressure if:  Your blood pressure cannot be lowered with healthy choices.  Your blood pressure is higher than 130/80.  Follow these instructions at home: Eating and drinking  If directed, follow the DASH eating plan. This diet includes: ? Filling half of your plate at each meal with fruits and vegetables. ? Filling one quarter of your plate at each meal with whole grains. Whole grains include whole wheat pasta, brown rice, and whole grain bread. ? Eating or drinking low-fat dairy products, such as skim milk or low-fat yogurt. ? Filling one quarter of your plate at each meal with low-fat (lean) proteins. Low-fat proteins include fish, skinless chicken, eggs, beans, and tofu. ? Avoiding fatty meat, cured and processed meat, or chicken with skin. ? Avoiding premade or processed food.  Eat less than 1,500 mg of salt (sodium) a day.  Limit alcohol use to no more than 1 drink a day for nonpregnant women and 2 drinks a day for men. One drink equals 12 oz of beer, 5 oz of wine, or 1 oz of  hard liquor. Lifestyle  Work with your doctor to stay at a healthy weight or to lose weight. Ask your doctor what the best weight is for you.  Get at least 30 minutes of exercise that causes your heart to beat faster (aerobic exercise) most days of the week. This may include walking, swimming, or biking.  Get at least 30 minutes of exercise that strengthens your muscles (resistance exercise) at least 3 days a week. This may include lifting weights or pilates.  Do not use any products that contain nicotine or tobacco. This includes cigarettes and e-cigarettes. If you need help quitting, ask your doctor.  Check your blood pressure at home as told by your doctor.  Keep all follow-up visits as told by your doctor. This is important. Medicines  Take over-the-counter and prescription medicines only as told by your doctor. Follow directions carefully.  Do not skip doses of blood pressure medicine. The medicine does not work as well if you skip doses. Skipping doses also puts you at risk for problems.  Ask your doctor about side effects or reactions to medicines that you should watch for. Contact a doctor if:  You think you are having a reaction to the medicine you are taking.  You have headaches that keep coming back (recurring).  You feel dizzy.  You have swelling in your ankles.  You have trouble with your vision. Get help right away if:  You get a very bad headache.  You start to feel confused.  You  feel weak or numb.  You feel faint.  You get very bad pain in your: ? Chest. ? Belly (abdomen).  You throw up (vomit) more than once.  You have trouble breathing. Summary  Hypertension is another name for high blood pressure.  Making healthy choices can help lower blood pressure. If your blood pressure cannot be controlled with healthy choices, you may need to take medicine. This information is not intended to replace advice given to you by your health care provider. Make  sure you discuss any questions you have with your health care provider. Document Released: 09/11/2007 Document Revised: 02/21/2016 Document Reviewed: 02/21/2016 Elsevier Interactive Patient Education  2018 [employer]   Food Choices for Gastroesophageal Reflux Disease, Adult When you have gastroesophageal reflux disease (GERD), the foods you eat and your eating habits are very important. Choosing the right foods can help ease your discomfort. What guidelines do I need to follow?  Choose fruits, vegetables, whole grains, and low-fat dairy products.  Choose low-fat meat, fish, and poultry.  Limit fats such as oils, salad dressings, butter, nuts, and avocado.  Keep a food diary. This helps you identify foods that cause symptoms.  Avoid foods that cause symptoms. These may be different for everyone.  Eat small meals often instead of 3 large meals a day.  Eat your meals slowly, in a place where you are relaxed.  Limit fried foods.  Cook foods using methods other than frying.  Avoid drinking alcohol.  Avoid drinking large amounts of liquids with your meals.  Avoid bending over or lying down until 2-3 hours after eating. What foods are not recommended? These are some foods and drinks that may make your symptoms worse: Vegetables Tomatoes. Tomato juice. Tomato and spaghetti sauce. Chili peppers. Onion and garlic. Horseradish. Fruits Oranges, grapefruit, and lemon (fruit and juice). Meats High-fat meats, fish, and poultry. This includes hot dogs, ribs, ham, sausage, salami, and bacon. Dairy Whole milk and chocolate milk. Sour cream. Cream. Butter. Ice cream. Cream cheese. Drinks Coffee and tea. Bubbly (carbonated) drinks or energy drinks. Condiments Hot sauce. Barbecue sauce. Sweets/Desserts Chocolate and cocoa. Donuts. Peppermint and spearmint. Fats and Oils High-fat foods. This includes [place] fries and potato chips. Other Vinegar. Strong spices. This includes black  pepper, white pepper, red pepper, cayenne, curry powder, cloves, ginger, and chili powder. The items listed above may not be a complete list of foods and drinks to avoid. Contact your dietitian for more information. This information is not intended to replace advice given to you by your health care provider. Make sure you discuss any questions you have with your health care provider. Document Released: 09/24/2011 Document Revised: 08/31/2015 Document Reviewed: 01/27/2013 Elsevier Interactive Patient Education  2017 [employer]

## 2018-03-17 NOTE — Progress Notes (Signed)
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

## 2018-03-17 NOTE — Assessment & Plan Note (Signed)
Gastroesophageal reflux disease precipitating hoarseness Plan Add Pepcid 20 mg nightly to omeprazole 20 mg daily Reflux diet was reviewed with the patient

## 2018-03-17 NOTE — Assessment & Plan Note (Signed)
Asthma exacerbation now resolved Ongoing hoarseness Review hoarseness assessment

## 2018-03-17 NOTE — Progress Notes (Addendum)
Subjective:    Patient ID: Crystal Burns, female    DOB: 05/19/1980, 37 y.o.   MRN: [id number]  37 y.o.F here for HTN and work in for same. Note was in ED 01/2018 for asthma flare.   Bp today 144/88.  Concerned about HTN.  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    BP high at home for three weeks and gained 12#.  140-160/100 Pulse 70-80s.      Notes hoarseness, had PNA end of July.  Went to Urgent care and Rx again, since then no voice. No real cough      Past Medical History:  Diagnosis Date  . Asthma    2010-only bothers pt during bad cold  . Atypical chest pain 01/16/2016   Atypical chest pain  . Depression    2010-had when mother passed away  . History of gonorrhea   . Hypertension    2004  . Obesity   . Preeclampsia complicating hypertension 01/21/2015     Family History  Problem Relation Age of Onset  . Hypertension Mother   . Diabetes Mother   . Cancer Mother   . Hypertension Father   . Diabetes Father   . Hyperlipidemia Father   . Arthritis Other   . Asthma Other   . Breast cancer Other   . Heart failure Other   . Congenital heart disease Other   . Depression Other   . Heart attack Other      Social History   Socioeconomic History  . Marital status: Single    Spouse name: Not on file  . Number of children: Not on file  . Years of education: Not on file  . Highest education level: Not on file  Occupational History  . Not on file  Social Needs  . Financial resource strain: Not on file  . Food insecurity:    Worry: Not on file    Inability: Not on file  . Transportation needs:    Medical: Not on file    Non-medical: Not on file  Tobacco Use  . Smoking status: Former Smoker    Types: Cigarettes    Last attempt to quit: 10/31/2010    Years since quitting: 7.6  . Smokeless tobacco: Never Used  Substance and Sexual Activity  . Alcohol use: No    Alcohol/week: 0.0 standard drinks  . Drug use: No  . Sexual activity:  Not on file  Lifestyle  . Physical activity:    Days per week: Not on file    Minutes per session: Not on file  . Stress: Not on file  Relationships  . Social connections:    Talks on phone: Not on file    Gets together: Not on file    Attends religious service: Not on file    Active member of club or organization: Not on file    Attends meetings of clubs or organizations: Not on file    Relationship status: Not on file  . Intimate partner violence:    Fear of current or ex partner: Not on file    Emotionally abused: Not on file    Physically abused: Not on file    Forced sexual activity: Not on file  Other Topics Concern  . Not on file  Social History Narrative  . Not on file     Allergies  Allergen Reactions  . Bee Venom Hives and Swelling  . Lisinopril Shortness Of Breath and Cough  .  Shellfish Allergy Anaphylaxis     Outpatient Medications Prior to Visit  Medication Sig Dispense Refill  . albuterol (PROVENTIL) (2.5 MG/3ML) 0.083% nebulizer solution Take 3 mLs (2.5 mg total) by nebulization every 6 (six) hours as needed for wheezing or shortness of breath. 75 mL 12  . albuterol (PROVENTIL HFA;VENTOLIN HFA) 108 (90 Base) MCG/ACT inhaler Inhale 2 puffs into the lungs every 4 (four) hours as needed for wheezing or shortness of breath (cough, shortness of breath or wheezing.). 1 Inhaler 11  . hydrochlorothiazide (MICROZIDE) 12.5 MG capsule Take 1 capsule (12.5 mg total) by mouth daily. 90 capsule 3  . losartan (COZAAR) 25 MG tablet Take 1 tablet (25 mg total) by mouth daily. 90 tablet 3  . omeprazole (PRILOSEC) 20 MG capsule Take 20 mg by mouth daily.    . benzonatate (TESSALON) 200 MG capsule Take 1 capsule (200 mg total) by mouth every 8 (eight) hours. (Patient not taking: Reported on 03/17/2018) 21 capsule 0  . cetirizine (ZYRTEC) 10 MG tablet Take 1 tablet (10 mg total) by mouth daily. (Patient not taking: Reported on 03/17/2018) 30 tablet 11  . fluticasone (FLONASE) 50  MCG/ACT nasal spray Place 2 sprays into both nostrils daily. (Patient not taking: Reported on 03/17/2018) 1 g 0  . ipratropium (ATROVENT) 0.06 % nasal spray Place 2 sprays into both nostrils 4 (four) times daily. (Patient not taking: Reported on 03/17/2018) 15 mL 0  . methylPREDNISolone (MEDROL) 4 MG tablet Take 1 tablet (4 mg total) by mouth 2 (two) times daily. (Patient not taking: Reported on 03/17/2018) 10 tablet 1   No facility-administered medications prior to visit.      Review of Systems  Constitutional: Positive for fatigue. Negative for fever.  HENT: Positive for postnasal drip, rhinorrhea, sore throat and voice change. Negative for congestion, ear discharge, ear pain, sinus pressure, sinus pain, sneezing and trouble swallowing.        Feels like something is stuck in throat  Respiratory: Negative for cough, chest tightness, shortness of breath and wheezing.        Feels like has to clear the throat Has allergy hx  Cardiovascular: Negative for palpitations and leg swelling.  Gastrointestinal: Negative for abdominal distention and abdominal pain.       Notes some belching        Objective:   Physical Exam Vitals:   03/17/18 1550  BP: (!) 144/88  Pulse: 71  Resp: 16  Temp: 98.4 F (36.9 C)  TempSrc: Oral  SpO2: 98%  Weight: (!) 310 lb (140.6 kg)    Gen: Pleasant, obese , anxious, in no distress,   ENT: No lesions,  mouth clear,  oropharynx clear, +++ postnasal drip  Neck: No JVD, no TMG, no carotid bruits  Lungs: No use of accessory muscles, no dullness to percussion, clear without rales or rhonchi  Cardiovascular: RRR, heart sounds normal, no murmur or gallops, no peripheral edema  Abdomen: soft and NT, no HSM,  BS normal  Musculoskeletal: No deformities, no cyanosis or clubbing  Neuro: alert, non focal  Skin: Warm, no lesions or rashes  No results found.        Assessment & Plan:  I personally reviewed all images and lab data in the [facility] system as  well as any outside material available during this office visit and agree with the  radiology impressions.   Essential hypertension Essential hypertension which is poorly controlled  Plan Increase hydrochlorthiazide 25 mg daily Increase losartan to 50 mg  daily DASHDiet was reviewed with the patient  Asthma with exacerbation Asthma exacerbation now resolved Ongoing hoarseness Review hoarseness assessment  Gastroesophageal reflux disease without esophagitis Gastroesophageal reflux disease precipitating hoarseness Plan Add Pepcid 20 mg nightly to omeprazole 20 mg daily Reflux diet was reviewed with the patient  Hoarseness of voice Ongoing hoarseness Patient instructed to vocal hygiene and to begin Flonase 2 sprays each nostril daily for postnasal drip syndrome and treat underlying reflux disease  Anxiety Significant anxiety syndrome Referral to licensed clinical social work   Akylah was seen today for follow-up.  Diagnoses and all orders for this visit:  Essential hypertension  Anxiety -     Ambulatory referral to Social Work  Hoarseness of voice  Gastroesophageal reflux disease without esophagitis  Encounter for contraceptive management, unspecified type -     medroxyPROGESTERone (DEPO-PROVERA) injection 150 mg -     POCT urine pregnancy  Mild intermittent asthma with acute exacerbation  Other orders -     hydrochlorothiazide (HYDRODIURIL) 25 MG tablet; Take 1 tablet (25 mg total) by mouth daily. -     losartan (COZAAR) 50 MG tablet; Take 1 tablet (50 mg total) by mouth daily. -     omeprazole (PRILOSEC) 20 MG capsule; Take 1 capsule (20 mg total) by mouth daily. -     Discontinue: fluticasone (FLONASE) 50 MCG/ACT nasal spray; Place 2 sprays into both nostrils daily. -     Discontinue: albuterol (PROVENTIL HFA;VENTOLIN HFA) 108 (90 Base) MCG/ACT inhaler; Inhale 2 puffs into the lungs every 4 (four) hours as needed for wheezing or shortness of breath (cough,  shortness of breath or wheezing.). -     famotidine (PEPCID) 20 MG tablet; Take 1 tablet (20 mg total) by mouth at bedtime. -     albuterol (PROVENTIL HFA;VENTOLIN HFA) 108 (90 Base) MCG/ACT inhaler; Inhale 2 puffs into the lungs every 4 (four) hours as needed for wheezing or shortness of breath (cough, shortness of breath or wheezing.).

## 2018-03-17 NOTE — Assessment & Plan Note (Signed)
Ongoing hoarseness Patient instructed to vocal hygiene and to begin Flonase 2 sprays each nostril daily for postnasal drip syndrome and treat underlying reflux disease

## 2018-03-24 ENCOUNTER — Encounter: Payer: Medicaid Other | Admitting: Family Medicine

## 2018-04-03 ENCOUNTER — Telehealth: Payer: Self-pay | Admitting: Licensed Clinical Social Worker

## 2018-04-03 NOTE — Telephone Encounter (Signed)
LCSWA attempted to contact pt to follow up on behavioral health screens and/or psychosocial needs. LCSWA left message for a return call.

## 2018-04-17 MED FILL — HYDROCHLOROTHIAZIDE 25 MG T: 25 | 30 days supply | Qty: 30 | Fill #1

## 2018-04-17 MED FILL — LOSARTAN POTASSIUM 50 MG TA: 50 | 30 days supply | Qty: 30 | Fill #1

## 2018-04-27 ENCOUNTER — Ambulatory Visit (HOSPITAL_COMMUNITY)
Admission: EM | Admit: 2018-04-27 | Discharge: 2018-04-27 | Disposition: A | Discharge status: Home / Self Care | Payer: Medicaid Other | Attending: Family Medicine | Admitting: Family Medicine

## 2018-04-27 ENCOUNTER — Encounter (HOSPITAL_COMMUNITY): Payer: Self-pay | Admitting: Emergency Medicine

## 2018-04-27 DIAGNOSIS — R05 Cough: Secondary | ICD-10-CM

## 2018-04-27 DIAGNOSIS — J069 Acute upper respiratory infection, unspecified: Secondary | ICD-10-CM | POA: Insufficient documentation

## 2018-04-27 DIAGNOSIS — B9789 Other viral agents as the cause of diseases classified elsewhere: Secondary | ICD-10-CM | POA: Insufficient documentation

## 2018-04-27 LAB — POCT URINALYSIS DIP (DEVICE)
BILIRUBIN URINE: NEGATIVE
Glucose, UA: NEGATIVE mg/dL
HGB URINE DIPSTICK: NEGATIVE
Ketones, ur: NEGATIVE mg/dL
LEUKOCYTES UA: NEGATIVE
Nitrite: NEGATIVE
Protein, ur: NEGATIVE mg/dL
SPECIFIC GRAVITY, URINE: 1.015 (ref 1.005–1.030)
Urobilinogen, UA: 2 mg/dL — ABNORMAL HIGH (ref 0.0–1.0)
pH: 7 (ref 5.0–8.0)

## 2018-04-27 MED ORDER — BENZONATATE 200 MG PO CAPS: 200.0000 mg | ORAL_CAPSULE | Freq: Three times a day (TID) | ORAL | 0 refills | Status: AC | PRN

## 2018-04-27 MED ORDER — CETIRIZINE HCL 10 MG PO CAPS: 10.0000 mg | ORAL_CAPSULE | Freq: Every day | ORAL | 0 refills | Status: DC

## 2018-04-27 NOTE — Discharge Instructions (Signed)
Your urine did not show any signs of infection or any blood concerning for kidney stones  Use anti-inflammatories for pain/swelling. You may take up to 800 mg Ibuprofen every 8 hours with food. You may supplement Ibuprofen with Tylenol [phone] mg every 8 hours.   Your cough is most likely viral Please begin taking daily cetirizine/Zyrtec to help with congestion and drainage that can be irritating her vocal cords, please take consistently May use honey and lemon mixed in hot tea to help with hoarseness  Follow-up with [facility] ENT if symptoms persisting with hoarse voice

## 2018-04-27 NOTE — ED Provider Notes (Signed)
[facility]    CSN: [id number] Arrival date & time: 04/27/18  1200     History   Chief Complaint Chief Complaint  Patient presents with  . URI    HPI Crystal Burns is a 38 y.o. female history of asthma, presenting today for evaluation of cough and hoarseness.  Patient states that over the past 3 days she has had a cough.  Cough is been occasional, but recently it has started to cause her chest discomfort.  Chest discomfort is mainly with coughing, not present at rest.  Cough is been productive with a lot of mucus.  She is also had a sore throat and is concerned that her voice has been hoarse.  Notes that her voice has been hoarse for over a month, but worsened recently with a cough.  She has been drinking lemon tea.  She is also tried Alka-Seltzer.   She has also noticed a pain in her left side.  This is been going on for 1 week.  Concerned about if this could possibly be related to her kidneys.  Denies any injury or fall.  Denies issues with bowel movements.  HPI  Past Medical History:  Diagnosis Date  . Asthma    2010-only bothers pt during bad cold  . Atypical chest pain 01/16/2016   Atypical chest pain  . Depression    2010-had when mother passed away  . History of gonorrhea   . Hypertension    2004  . Obesity   . Preeclampsia complicating hypertension 01/21/2015    Patient Active Problem List   Diagnosis Date Noted  . Hoarseness of voice 03/17/2018  . Gastroesophageal reflux disease without esophagitis 03/17/2018  . Supervision of normal pregnancy, antepartum 12/15/2017  . Acute medial meniscus tear of right knee 02/10/2017  . Vitamin D deficiency 07/10/2015  . Anxiety 06/20/2015  . Costochondritis 05/18/2015  . Primary osteoarthritis of right knee 04/22/2014  . Essential hypertension 07/18/2012  . LUMBOSACRAL STRAIN, ACUTE 07/05/2008  . Morbid obesity (HCC) 02/24/2008  . Asthma with exacerbation 02/24/2008    Past Surgical History:    Procedure Laterality Date  . CHOLECYSTECTOMY    . DILATION AND CURETTAGE OF UTERUS    . WISDOM TOOTH EXTRACTION      OB History    Gravida  11   Para  7   Term  7   Preterm  0   AB  4   Living  6     SAB  4   TAB  0   Ectopic  0   Multiple  0   Live Births  7            Home Medications    Prior to Admission medications   Medication Sig Start Date End Date Taking? Authorizing Provider  albuterol (PROVENTIL HFA;VENTOLIN HFA) 108 (90 Base) MCG/ACT inhaler Inhale 2 puffs into the lungs every 4 (four) hours as needed for wheezing or shortness of breath (cough, shortness of breath or wheezing.). 03/17/18   Storm FriskWright, Patrick E, MD  albuterol (PROVENTIL) (2.5 MG/3ML) 0.083% nebulizer solution Take 3 mLs (2.5 mg total) by nebulization every 6 (six) hours as needed for wheezing or shortness of breath. 08/07/17   Elvina SidleLauenstein, Kurt, MD  benzonatate (TESSALON) 200 MG capsule Take 1 capsule (200 mg total) by mouth 3 (three) times daily as needed for up to 7 days for cough. 04/27/18 05/04/18  Wieters, Hallie C, PA-C  Cetirizine HCl 10 MG CAPS Take 1 capsule (  10 mg total) by mouth daily for 10 days. 04/27/18 05/07/18  Wieters, Hallie C, PA-C  famotidine (PEPCID) 20 MG tablet Take 1 tablet (20 mg total) by mouth at bedtime. 03/17/18   Storm Frisk, MD  fluticasone (FLONASE) 50 MCG/ACT nasal spray Place 2 sprays into both nostrils daily. 03/17/18   Storm Frisk, MD  hydrochlorothiazide (HYDRODIURIL) 25 MG tablet Take 1 tablet (25 mg total) by mouth daily. 03/17/18   Storm Frisk, MD  losartan (COZAAR) 50 MG tablet Take 1 tablet (50 mg total) by mouth daily. 03/17/18 03/17/19  Storm Frisk, MD  omeprazole (PRILOSEC) 20 MG capsule Take 1 capsule (20 mg total) by mouth daily. 03/17/18   Storm Frisk, MD    Family History Family History  Problem Relation Age of Onset  . Hypertension Mother   . Diabetes Mother   . Cancer Mother   . Hypertension Father   . Diabetes  Father   . Hyperlipidemia Father   . Arthritis Unknown   . Asthma Unknown   . Breast cancer Unknown   . Heart failure Unknown   . Congenital heart disease Unknown   . Depression Unknown   . Heart attack Unknown     Social History Social History   Tobacco Use  . Smoking status: Former Smoker    Types: Cigarettes    Last attempt to quit: 10/31/2010    Years since quitting: 7.4  . Smokeless tobacco: Never Used  Substance Use Topics  . Alcohol use: No    Alcohol/week: 0.0 standard drinks  . Drug use: No     Allergies   Bee venom; Lisinopril; and Shellfish allergy   Review of Systems Review of Systems  Constitutional: Negative for activity change, appetite change, chills, fatigue and fever.  HENT: Positive for congestion, rhinorrhea, sore throat and voice change. Negative for ear pain, sinus pressure and trouble swallowing.   Eyes: Negative for discharge and redness.  Respiratory: Positive for cough. Negative for chest tightness and shortness of breath.   Cardiovascular: Negative for chest pain.  Gastrointestinal: Negative for abdominal pain, diarrhea, nausea and vomiting.  Genitourinary: Positive for flank pain. Negative for dysuria, hematuria, pelvic pain and urgency.  Musculoskeletal: Negative for myalgias.  Skin: Negative for rash.  Neurological: Negative for dizziness, light-headedness and headaches.     Physical Exam Triage Vital Signs ED Triage Vitals  Enc Vitals Group     BP 04/27/18 1305 139/84     Pulse Rate 04/27/18 1305 65     Resp 04/27/18 1305 18     Temp 04/27/18 1305 98 F (36.7 C)     Temp Source 04/27/18 1305 Temporal     SpO2 04/27/18 1305 100 %     Weight --      Height --      Head Circumference --      Peak Flow --      Pain Score 04/27/18 1306 8     Pain Loc --      Pain Edu? --      Excl. in GC? --    No data found.  Updated Vital Signs BP 139/84 (BP Location: Left Arm)   Pulse 65   Temp 98 F (36.7 C) (Temporal)   Resp 18    SpO2 100%   Visual Acuity Right Eye Distance:   Left Eye Distance:   Bilateral Distance:    Right Eye Near:   Left Eye Near:    Bilateral Near:  Physical Exam Vitals signs and nursing note reviewed.  Constitutional:      General: She is not in acute distress.    Appearance: She is well-developed.  HENT:     Head: Normocephalic and atraumatic.     Ears:     Comments: Bilateral ears without tenderness to palpation of external auricle, tragus and mastoid, EAC's without erythema or swelling, TM's with good bony landmarks and cone of light. Non erythematous.    Nose:     Comments: Nasal mucosa erythematous, swollen turbinates    Mouth/Throat:     Comments: Oral mucosa pink and moist, no tonsillar enlargement or exudate. Posterior pharynx patent and erythematous, no uvula deviation or swelling.  Hoarse sounding voice. Eyes:     Conjunctiva/sclera: Conjunctivae normal.  Neck:     Musculoskeletal: Neck supple.  Cardiovascular:     Rate and Rhythm: Normal rate and regular rhythm.     Heart sounds: No murmur.  Pulmonary:     Effort: Pulmonary effort is normal. No respiratory distress.     Breath sounds: Normal breath sounds.     Comments: Breathing comfortably at rest, CTABL, no wheezing, rales or other adventitious sounds auscultated Abdominal:     Palpations: Abdomen is soft.     Tenderness: There is no abdominal tenderness.  Musculoskeletal:     Comments: Tender to palpation over left hip/flank, no CVA tenderness  Skin:    General: Skin is warm and dry.  Neurological:     Mental Status: She is alert.      UC Treatments / Results  Labs (all labs ordered are listed, but only abnormal results are displayed) Labs Reviewed  POCT URINALYSIS DIP (DEVICE) - Abnormal; Notable for the following components:      Result Value   Urobilinogen, UA 2.0 (*)    All other components within normal limits    EKG None  Radiology No results found.  Procedures Procedures  (including critical care time)  Medications Ordered in UC Medications - No data to display  Initial Impression / Assessment and Plan / UC Course  I have reviewed the triage vital signs and the nursing notes.  Pertinent labs & imaging results that were available during my care of the patient were reviewed by me and considered in my medical decision making (see chart for details).     UA negative for leuks, nitrites and hemoglobin.  Given tenderness to left hip, likely more musculoskeletal cause of discomfort.  Will recommend anti-inflammatories.  Cough x3 days, lungs clear, vital signs stable.  Most likely viral etiology.  Will recommend symptomatic and supportive care for this.  For hoarseness, starting daily allergy pill to help with any drainage that is irritating vocal cords, Tessalon for cough.  Continue honey and lemon tea.  If hoarseness persisting please follow-up with ENT.  Discussed strict return precautions. Patient verbalized understanding and is agreeable with plan.  Final Clinical Impressions(s) / UC Diagnoses   Final diagnoses:  Viral URI with cough     Discharge Instructions     Your urine did not show any signs of infection or any blood concerning for kidney stones  Use anti-inflammatories for pain/swelling. You may take up to 800 mg Ibuprofen every 8 hours with food. You may supplement Ibuprofen with Tylenol [phone] mg every 8 hours.   Your cough is most likely viral Please begin taking daily cetirizine/Zyrtec to help with congestion and drainage that can be irritating her vocal cords, please take consistently May use honey  and lemon mixed in hot tea to help with hoarseness  Follow-up with [facility] ENT if symptoms persisting with hoarse voice   ED Prescriptions    Medication Sig Dispense Auth. Provider   Cetirizine HCl 10 MG CAPS Take 1 capsule (10 mg total) by mouth daily for 10 days. 10 capsule Wieters, Hallie C, PA-C   benzonatate (TESSALON) 200 MG  capsule Take 1 capsule (200 mg total) by mouth 3 (three) times daily as needed for up to 7 days for cough. 28 capsule Wieters, Hallie C, PA-C     Controlled Substance Prescriptions [place] Controlled Substance Registry consulted? Not Applicable   Lew Dawes, [place] 04/27/18 1440

## 2018-04-27 NOTE — ED Triage Notes (Signed)
Pt sts URI with cough and pain with cough

## 2018-05-11 NOTE — Progress Notes (Deleted)
Subjective:    Patient ID: Crystal Burns, female    DOB: Aug 14, 1980, 38 y.o.   MRN: [id number]  38 y.o.F here for HTN and work in for same. Note was in ED 01/2018 for asthma flare.   Bp today 144/88.  Concerned about HTN.  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    BP high at home for three weeks and gained 12#.  140-160/100 Pulse 70-80s.      Notes hoarseness, had PNA end of July.  Went to Urgent care and Rx again, since then no voice. No real cough      Past Medical History:  Diagnosis Date  . Asthma    2010-only bothers pt during bad cold  . Atypical chest pain 01/16/2016   Atypical chest pain  . Depression    2010-had when mother passed away  . History of gonorrhea   . Hypertension    2004  . Obesity   . Preeclampsia complicating hypertension 01/21/2015     Family History  Problem Relation Age of Onset  . Hypertension Mother   . Diabetes Mother   . Cancer Mother   . Hypertension Father   . Diabetes Father   . Hyperlipidemia Father   . Arthritis Unknown   . Asthma Unknown   . Breast cancer Unknown   . Heart failure Unknown   . Congenital heart disease Unknown   . Depression Unknown   . Heart attack Unknown      Social History   Socioeconomic History  . Marital status: Single    Spouse name: Not on file  . Number of children: Not on file  . Years of education: Not on file  . Highest education level: Not on file  Occupational History  . Not on file  Social Needs  . Financial resource strain: Not on file  . Food insecurity:    Worry: Not on file    Inability: Not on file  . Transportation needs:    Medical: Not on file    Non-medical: Not on file  Tobacco Use  . Smoking status: Former Smoker    Types: Cigarettes    Last attempt to quit: 10/31/2010    Years since quitting: 7.5  . Smokeless tobacco: Never Used  Substance and Sexual Activity  . Alcohol use: No    Alcohol/week: 0.0 standard drinks  . Drug use: No  .  Sexual activity: Not on file  Lifestyle  . Physical activity:    Days per week: Not on file    Minutes per session: Not on file  . Stress: Not on file  Relationships  . Social connections:    Talks on phone: Not on file    Gets together: Not on file    Attends religious service: Not on file    Active member of club or organization: Not on file    Attends meetings of clubs or organizations: Not on file    Relationship status: Not on file  . Intimate partner violence:    Fear of current or ex partner: Not on file    Emotionally abused: Not on file    Physically abused: Not on file    Forced sexual activity: Not on file  Other Topics Concern  . Not on file  Social History Narrative  . Not on file     Allergies  Allergen Reactions  . Bee Venom Hives and Swelling  . Lisinopril Shortness Of Breath and Cough  .  Shellfish Allergy Anaphylaxis     Outpatient Medications Prior to Visit  Medication Sig Dispense Refill  . albuterol (PROVENTIL HFA;VENTOLIN HFA) 108 (90 Base) MCG/ACT inhaler Inhale 2 puffs into the lungs every 4 (four) hours as needed for wheezing or shortness of breath (cough, shortness of breath or wheezing.). 1 Inhaler 3  . albuterol (PROVENTIL) (2.5 MG/3ML) 0.083% nebulizer solution Take 3 mLs (2.5 mg total) by nebulization every 6 (six) hours as needed for wheezing or shortness of breath. 75 mL 12  . Cetirizine HCl 10 MG CAPS Take 1 capsule (10 mg total) by mouth daily for 10 days. 10 capsule 0  . famotidine (PEPCID) 20 MG tablet Take 1 tablet (20 mg total) by mouth at bedtime. 30 tablet 6  . fluticasone (FLONASE) 50 MCG/ACT nasal spray Place 2 sprays into both nostrils daily. 1 g 6  . hydrochlorothiazide (HYDRODIURIL) 25 MG tablet Take 1 tablet (25 mg total) by mouth daily. 30 tablet 6  . losartan (COZAAR) 50 MG tablet Take 1 tablet (50 mg total) by mouth daily. 30 tablet 11  . omeprazole (PRILOSEC) 20 MG capsule Take 1 capsule (20 mg total) by mouth daily. 30 capsule  6   No facility-administered medications prior to visit.      Review of Systems  Constitutional: Positive for fatigue. Negative for fever.  HENT: Positive for postnasal drip, rhinorrhea, sore throat and voice change. Negative for congestion, ear discharge, ear pain, sinus pressure, sinus pain, sneezing and trouble swallowing.        Feels like something is stuck in throat  Respiratory: Negative for cough, chest tightness, shortness of breath and wheezing.        Feels like has to clear the throat Has allergy hx  Cardiovascular: Negative for palpitations and leg swelling.  Gastrointestinal: Negative for abdominal distention and abdominal pain.       Notes some belching        Objective:   Physical Exam  There were no vitals filed for this visit.  Gen: Pleasant, obese , anxious, in no distress,   ENT: No lesions,  mouth clear,  oropharynx clear, +++ postnasal drip  Neck: No JVD, no TMG, no carotid bruits  Lungs: No use of accessory muscles, no dullness to percussion, clear without rales or rhonchi  Cardiovascular: RRR, heart sounds normal, no murmur or gallops, no peripheral edema  Abdomen: soft and NT, no HSM,  BS normal  Musculoskeletal: No deformities, no cyanosis or clubbing  Neuro: alert, non focal  Skin: Warm, no lesions or rashes  No results found.        Assessment & Plan:  I personally reviewed all images and lab data in the [facility] system as well as any outside material available during this office visit and agree with the  radiology impressions.   No problem-specific Assessment & Plan notes found for this encounter.   There are no diagnoses linked to this encounter.

## 2018-05-12 ENCOUNTER — Institutional Professional Consult (permissible substitution): Payer: Medicaid Other | Admitting: Licensed Clinical Social Worker

## 2018-05-12 ENCOUNTER — Inpatient Hospital Stay: Payer: Medicaid Other | Admitting: Critical Care Medicine

## 2018-05-20 ENCOUNTER — Other Ambulatory Visit: Payer: Self-pay

## 2018-05-20 ENCOUNTER — Ambulatory Visit (HOSPITAL_COMMUNITY)
Admission: EM | Admit: 2018-05-20 | Discharge: 2018-05-20 | Disposition: A | Discharge status: Home / Self Care | Payer: Self-pay | Attending: Family Medicine | Admitting: Family Medicine

## 2018-05-20 ENCOUNTER — Encounter (HOSPITAL_COMMUNITY): Payer: Self-pay | Admitting: Emergency Medicine

## 2018-05-20 DIAGNOSIS — K5900 Constipation, unspecified: Secondary | ICD-10-CM

## 2018-05-20 MED ORDER — POLYETHYLENE GLYCOL 3350 17 G PO PACK: PACK | ORAL | 4 refills | Status: DC

## 2018-05-20 NOTE — ED Triage Notes (Addendum)
Left hip pain for 2 weeks.  Pain occurs with movement.  Denies any injury.    Patient has many concerns

## 2018-05-20 NOTE — ED Provider Notes (Signed)
[facility]    CSN: [id number] Arrival date & time: 05/20/18  1620     History   Chief Complaint Chief Complaint  Patient presents with  . Flank Pain    HPI Crystal Burns is a 38 y.o. female.   This is a 38 year old woman who has had 2 weeks of migratory abdominal pain and left-sided abdominal pain just above her hip.  She has been constipated.  She knows she is not eating right and she is gained quite a bit of weight.  She is passing gas but not having regular bowel movements.  She has had no fever.     Past Medical History:  Diagnosis Date  . Asthma    2010-only bothers pt during bad cold  . Atypical chest pain 01/16/2016   Atypical chest pain  . Depression    2010-had when mother passed away  . History of gonorrhea   . Hypertension    2004  . Obesity   . Preeclampsia complicating hypertension 01/21/2015    Patient Active Problem List   Diagnosis Date Noted  . Hoarseness of voice 03/17/2018  . Gastroesophageal reflux disease without esophagitis 03/17/2018  . Supervision of normal pregnancy, antepartum 12/15/2017  . Acute medial meniscus tear of right knee 02/10/2017  . Vitamin D deficiency 07/10/2015  . Anxiety 06/20/2015  . Costochondritis 05/18/2015  . Primary osteoarthritis of right knee 04/22/2014  . Essential hypertension 07/18/2012  . LUMBOSACRAL STRAIN, ACUTE 07/05/2008  . Morbid obesity (HCC) 02/24/2008  . Asthma with exacerbation 02/24/2008    Past Surgical History:  Procedure Laterality Date  . CHOLECYSTECTOMY    . DILATION AND CURETTAGE OF UTERUS    . WISDOM TOOTH EXTRACTION      OB History    Gravida  11   Para  7   Term  7   Preterm  0   AB  4   Living  6     SAB  4   TAB  0   Ectopic  0   Multiple  0   Live Births  7            Home Medications    Prior to Admission medications   Medication Sig Start Date End Date Taking? Authorizing Provider  famotidine (PEPCID) 20 MG tablet Take 1 tablet  (20 mg total) by mouth at bedtime. 03/17/18  Yes Storm FriskWright, Patrick E, MD  hydrochlorothiazide (HYDRODIURIL) 25 MG tablet Take 1 tablet (25 mg total) by mouth daily. 03/17/18  Yes Storm FriskWright, Patrick E, MD  losartan (COZAAR) 50 MG tablet Take 1 tablet (50 mg total) by mouth daily. 03/17/18 03/17/19 Yes Storm FriskWright, Patrick E, MD  albuterol (PROVENTIL HFA;VENTOLIN HFA) 108 (90 Base) MCG/ACT inhaler Inhale 2 puffs into the lungs every 4 (four) hours as needed for wheezing or shortness of breath (cough, shortness of breath or wheezing.). 03/17/18   Storm FriskWright, Patrick E, MD  albuterol (PROVENTIL) (2.5 MG/3ML) 0.083% nebulizer solution Take 3 mLs (2.5 mg total) by nebulization every 6 (six) hours as needed for wheezing or shortness of breath. 08/07/17   Elvina SidleLauenstein, Elian Gloster, MD  Cetirizine HCl 10 MG CAPS Take 1 capsule (10 mg total) by mouth daily for 10 days. 04/27/18 05/07/18  Wieters, Hallie C, PA-C  omeprazole (PRILOSEC) 20 MG capsule Take 1 capsule (20 mg total) by mouth daily. 03/17/18   Storm FriskWright, Patrick E, MD  polyethylene glycol [facility](MIRALAX) packet Take one capful in large glass several times a day until you go to  the bathroom, then once a day 05/20/18   Elvina Sidle, MD    Family History Family History  Problem Relation Age of Onset  . Hypertension Mother   . Diabetes Mother   . Cancer Mother   . Hypertension Father   . Diabetes Father   . Hyperlipidemia Father   . Arthritis Other   . Asthma Other   . Breast cancer Other   . Heart failure Other   . Congenital heart disease Other   . Depression Other   . Heart attack Other     Social History Social History   Tobacco Use  . Smoking status: Former Smoker    Types: Cigarettes    Last attempt to quit: 10/31/2010    Years since quitting: 7.5  . Smokeless tobacco: Never Used  Substance Use Topics  . Alcohol use: No    Alcohol/week: 0.0 standard drinks  . Drug use: No     Allergies   Bee venom; Lisinopril; and Shellfish allergy   Review of  Systems Review of Systems  Gastrointestinal: Positive for abdominal pain and constipation. Negative for diarrhea, nausea and rectal pain.  All other systems reviewed and are negative.    Physical Exam Triage Vital Signs ED Triage Vitals  Enc Vitals Group     BP 05/20/18 1655 (!) 106/59     Pulse Rate 05/20/18 1655 65     Resp 05/20/18 1655 18     Temp 05/20/18 1655 99.1 F (37.3 C)     Temp Source 05/20/18 1655 Temporal     SpO2 05/20/18 1655 100 %     Weight --      Height --      Head Circumference --      Peak Flow --      Pain Score 05/20/18 1650 9     Pain Loc --      Pain Edu? --      Excl. in GC? --    No data found.  Updated Vital Signs BP 113/63 (BP Location: Left Arm)   Pulse 66   Temp 99.1 F (37.3 C) (Temporal)   Resp 18   SpO2 100%    Physical Exam Vitals signs and nursing note reviewed.  Constitutional:      General: She is not in acute distress.    Appearance: Normal appearance. She is obese.  HENT:     Head: Normocephalic.     Right Ear: External ear normal.     Left Ear: External ear normal.     Nose: Nose normal.     Mouth/Throat:     Mouth: Mucous membranes are moist.     Pharynx: Oropharynx is clear.  Eyes:     Conjunctiva/sclera: Conjunctivae normal.  Neck:     Musculoskeletal: Normal range of motion and neck supple.  Cardiovascular:     Rate and Rhythm: Normal rate.     Heart sounds: Normal heart sounds.  Abdominal:     Tenderness: There is abdominal tenderness.     Comments: Diffuse tenderness with deep palpation Morbidly obese No guarding or rebound  Musculoskeletal: Normal range of motion.  Skin:    General: Skin is warm and dry.  Neurological:     General: No focal deficit present.     Mental Status: She is alert.  Psychiatric:        Mood and Affect: Mood normal.        Thought Content: Thought content normal.  UC Treatments / Results  Labs (all labs ordered are listed, but only abnormal results are  displayed) Labs Reviewed - No data to display  EKG None  Radiology No results found.  Procedures Procedures (including critical care time)  Medications Ordered in UC Medications - No data to display  Initial Impression / Assessment and Plan / UC Course  I have reviewed the triage vital signs and the nursing notes.  Pertinent labs & imaging results that were available during my care of the patient were reviewed by me and considered in my medical decision making (see chart for details).    Final Clinical Impressions(s) / UC Diagnoses   Final diagnoses:  Constipation, unspecified constipation type   Discharge Instructions   None    ED Prescriptions    Medication Sig Dispense Auth. Provider   polyethylene glycol (MIRALAX) packet Take one capful in large glass several times a day until you go to the bathroom, then once a day 14 each Elvina Sidle, MD     Controlled Substance Prescriptions [place] Controlled Substance Registry consulted? Not Applicable   Elvina Sidle, MD 05/20/18 1726

## 2018-05-21 MED FILL — LOSARTAN POTASSIUM 50 MG TA: 50 | 30 days supply | Qty: 30 | Fill #2

## 2018-05-25 ENCOUNTER — Encounter: Payer: Medicaid Other | Admitting: Family Medicine

## 2018-05-27 ENCOUNTER — Telehealth: Payer: Self-pay | Admitting: *Deleted

## 2018-05-27 NOTE — Telephone Encounter (Signed)
Medical Assistant left message on patient's home and cell voicemail. Voicemail states to give a call back to [place] with [facility] at [phone]. !!!Patient was scheduled for OV on 05/25/2018 and no showed. Please ask the patient the reasoning behind no showing and offer and available appointment!!!

## 2018-06-02 MED FILL — POLYETHYLENE GLYCOL 3350 PO: 17 | 14 days supply | Qty: 238 | Fill #0

## 2018-06-02 MED FILL — HYDROCHLOROTHIAZIDE 25 MG T: 25 | 30 days supply | Qty: 30 | Fill #2

## 2018-06-17 ENCOUNTER — Ambulatory Visit (HOSPITAL_COMMUNITY)
Admission: EM | Admit: 2018-06-17 | Discharge: 2018-06-17 | Disposition: A | Discharge status: Home / Self Care | Payer: Medicaid Other | Attending: Family Medicine | Admitting: Family Medicine

## 2018-06-17 ENCOUNTER — Other Ambulatory Visit: Payer: Self-pay

## 2018-06-17 ENCOUNTER — Encounter (HOSPITAL_COMMUNITY): Payer: Self-pay | Admitting: Emergency Medicine

## 2018-06-17 DIAGNOSIS — J069 Acute upper respiratory infection, unspecified: Secondary | ICD-10-CM

## 2018-06-17 DIAGNOSIS — B9789 Other viral agents as the cause of diseases classified elsewhere: Secondary | ICD-10-CM

## 2018-06-17 DIAGNOSIS — R062 Wheezing: Secondary | ICD-10-CM

## 2018-06-17 MED ORDER — DEXAMETHASONE SODIUM PHOSPHATE 10 MG/ML IJ SOLN
INTRAMUSCULAR | Status: AC
  Filled 2018-06-17: qty 1

## 2018-06-17 MED ORDER — DEXAMETHASONE SODIUM PHOSPHATE 10 MG/ML IJ SOLN
10.0000 mg | Freq: Once | INTRAMUSCULAR | Status: AC
  Administered 2018-06-17: 10 mg via INTRAMUSCULAR

## 2018-06-17 MED ORDER — BENZONATATE 100 MG PO CAPS: ORAL_CAPSULE | ORAL | 0 refills | Status: DC

## 2018-06-17 NOTE — ED Triage Notes (Signed)
Coughing for 3 days.  Reports using many otc remedies with no relief.  Reports coughing frequently at night.  Patient reports thick, green/yellow phlegm.

## 2018-06-17 NOTE — Discharge Instructions (Signed)

## 2018-06-17 NOTE — ED Provider Notes (Signed)
[facility]   [id number] 06/17/18 Arrival Time: 1602  ASSESSMENT & PLAN:  1. Viral URI with cough   2. Wheezing    See AVS for discharge instructions. No indications for chest imaging at this time. No suspicion for PNA. Discussed.  Meds ordered this encounter  Medications  . dexamethasone (DECADRON) injection 10 mg  . benzonatate (TESSALON) 100 MG capsule    Sig: Take 1 capsule by mouth every 8 (eight) hours for cough.    Dispense:  21 capsule    Refill:  0   Work note given. Discussed typical duration of symptoms. OTC symptom care as needed. Ensure adequate fluid intake and rest. May f/u with PCP or here as needed.  Reviewed expectations re: course of current medical issues. Questions answered. Outlined signs and symptoms indicating need for more acute intervention. Patient verbalized understanding. After Visit Summary given.   SUBJECTIVE: History from: patient.  Crystal Burns is a 38 y.o. female who presents with complaint of nasal congestion, post-nasal drainage, and a persistent dry cough; without sore throat. Onset abrupt, about 4 days ago; with fatigue and with mild body aches. SOB: none. Wheezing: moderate when present; worse at night. Fever: does not suspect. Overall normal PO intake without n/v. Known sick contacts: no. No specific or significant aggravating or alleviating factors reported. OTC treatment: none reported.  Social History   Tobacco Use  Smoking Status Former Smoker  . Types: Cigarettes  . Last attempt to quit: 10/31/2010  . Years since quitting: 7.6  Smokeless Tobacco Never Used    ROS: As per HPI. All other systems negative.   OBJECTIVE:  Vitals:   06/17/18 1705  BP: 137/78  Pulse: 88  Resp: 20  Temp: 98.5 F (36.9 C)  TempSrc: Oral  SpO2: 100%     General appearance: alert; appears fatigued HEENT: nasal congestion; clear runny nose; throat irritation secondary to post-nasal drainage Neck: supple without LAD  CV: RRR Lungs: unlabored respirations, symmetrical air entry with moderate expiratory wheezing; cough: mild Abd: soft Ext: no LE edema Skin: warm and dry Psychological: alert and cooperative; normal mood and affect  Allergies  Allergen Reactions  . Bee Venom Hives and Swelling  . Lisinopril Shortness Of Breath and Cough  . Shellfish Allergy Anaphylaxis    Past Medical History:  Diagnosis Date  . Asthma    2010-only bothers pt during bad cold  . Atypical chest pain 01/16/2016   Atypical chest pain  . Depression    2010-had when mother passed away  . History of gonorrhea   . Hypertension    2004  . Obesity   . Preeclampsia complicating hypertension 01/21/2015   Family History  Problem Relation Age of Onset  . Hypertension Mother   . Diabetes Mother   . Cancer Mother   . Hypertension Father   . Diabetes Father   . Hyperlipidemia Father   . Arthritis Other   . Asthma Other   . Breast cancer Other   . Heart failure Other   . Congenital heart disease Other   . Depression Other   . Heart attack Other    Social History   Socioeconomic History  . Marital status: Single    Spouse name: Not on file  . Number of children: Not on file  . Years of education: Not on file  . Highest education level: Not on file  Occupational History  . Not on file  Social Needs  . Financial resource strain: Not on file  .  Food insecurity:    Worry: Not on file    Inability: Not on file  . Transportation needs:    Medical: Not on file    Non-medical: Not on file  Tobacco Use  . Smoking status: Former Smoker    Types: Cigarettes    Last attempt to quit: 10/31/2010    Years since quitting: 7.6  . Smokeless tobacco: Never Used  Substance and Sexual Activity  . Alcohol use: No    Alcohol/week: 0.0 standard drinks  . Drug use: No  . Sexual activity: Not on file  Lifestyle  . Physical activity:    Days per week: Not on file    Minutes per session: Not on file  . Stress: Not on  file  Relationships  . Social connections:    Talks on phone: Not on file    Gets together: Not on file    Attends religious service: Not on file    Active member of club or organization: Not on file    Attends meetings of clubs or organizations: Not on file    Relationship status: Not on file  . Intimate partner violence:    Fear of current or ex partner: Not on file    Emotionally abused: Not on file    Physically abused: Not on file    Forced sexual activity: Not on file  Other Topics Concern  . Not on file  Social History Narrative  . Not on file           Mardella Layman, MD 06/24/18 0930

## 2018-06-18 MED FILL — BENZONATATE 100 MG CAP: 100 | 7 days supply | Qty: 21 | Fill #0

## 2018-06-21 ENCOUNTER — Other Ambulatory Visit: Payer: Self-pay

## 2018-06-21 ENCOUNTER — Encounter (HOSPITAL_COMMUNITY): Payer: Self-pay | Admitting: Emergency Medicine

## 2018-06-21 ENCOUNTER — Ambulatory Visit (HOSPITAL_COMMUNITY)
Admission: EM | Admit: 2018-06-21 | Discharge: 2018-06-21 | Disposition: A | Discharge status: Home / Self Care | Payer: Medicaid Other | Attending: Family Medicine | Admitting: Family Medicine

## 2018-06-21 DIAGNOSIS — B349 Viral infection, unspecified: Secondary | ICD-10-CM | POA: Diagnosis not present

## 2018-06-21 MED ORDER — LOPERAMIDE HCL 2 MG PO CAPS: 2.0000 mg | ORAL_CAPSULE | Freq: Four times a day (QID) | ORAL | 0 refills | Status: DC | PRN

## 2018-06-21 MED ORDER — ONDANSETRON HCL 4 MG PO TABS: 4.0000 mg | ORAL_TABLET | Freq: Four times a day (QID) | ORAL | 0 refills | Status: DC

## 2018-06-21 MED ORDER — ONDANSETRON 4 MG PO TBDP
4.0000 mg | ORAL_TABLET | Freq: Once | ORAL | Status: AC
  Administered 2018-06-21: 4 mg via ORAL

## 2018-06-21 MED ORDER — BENZONATATE 200 MG PO CAPS: 200.0000 mg | ORAL_CAPSULE | Freq: Two times a day (BID) | ORAL | 0 refills | Status: DC | PRN

## 2018-06-21 MED ORDER — ONDANSETRON 4 MG PO TBDP
ORAL_TABLET | ORAL | Status: AC
  Filled 2018-06-21: qty 1

## 2018-06-21 NOTE — ED Provider Notes (Signed)
[facility]    CSN: [id number] Arrival date & time: 06/21/18  1716     History   Chief Complaint Chief Complaint  Patient presents with  . Diarrhea    HPI Crystal Burns is a 37 y.o. female.   HPI  Patient is here for follow-up.  She still has some fatigue.  She states she is still having a lot of coughing.  Her temperature is 99.9 at home today.  She has some postnasal drip and congestion, states she is coughing up some thick sputum that is mostly white.  No chest pain. She also has developed nausea and vomiting.  She is had diarrhea.  She states that she has had so much watery diarrhea that her "bottom is bleeding". She does not know if this is from the illness of the medicine she is taking for the illness.  Her wheezing is better.  She is using her inhaler infrequently. She was scheduled to go back to work today.  She needs a note if she is going to be out of work longer.  Past Medical History:  Diagnosis Date  . Asthma    2010-only bothers pt during bad cold  . Atypical chest pain 01/16/2016   Atypical chest pain  . Depression    2010-had when mother passed away  . History of gonorrhea   . Hypertension    2004  . Obesity   . Preeclampsia complicating hypertension 01/21/2015    Patient Active Problem List   Diagnosis Date Noted  . Hoarseness of voice 03/17/2018  . Gastroesophageal reflux disease without esophagitis 03/17/2018  . Supervision of normal pregnancy, antepartum 12/15/2017  . Acute medial meniscus tear of right knee 02/10/2017  . Vitamin D deficiency 07/10/2015  . Anxiety 06/20/2015  . Costochondritis 05/18/2015  . Primary osteoarthritis of right knee 04/22/2014  . Essential hypertension 07/18/2012  . LUMBOSACRAL STRAIN, ACUTE 07/05/2008  . Morbid obesity (HCC) 02/24/2008  . Asthma with exacerbation 02/24/2008    Past Surgical History:  Procedure Laterality Date  . CHOLECYSTECTOMY    . DILATION AND CURETTAGE OF UTERUS    .  WISDOM TOOTH EXTRACTION      OB History    Gravida  11   Para  7   Term  7   Preterm  0   AB  4   Living  6     SAB  4   TAB  0   Ectopic  0   Multiple  0   Live Births  7            Home Medications    Prior to Admission medications   Medication Sig Start Date End Date Taking? Authorizing Provider  albuterol (PROVENTIL HFA;VENTOLIN HFA) 108 (90 Base) MCG/ACT inhaler Inhale 2 puffs into the lungs every 4 (four) hours as needed for wheezing or shortness of breath (cough, shortness of breath or wheezing.). 03/17/18   Storm Frisk, MD  albuterol (PROVENTIL) (2.5 MG/3ML) 0.083% nebulizer solution Take 3 mLs (2.5 mg total) by nebulization every 6 (six) hours as needed for wheezing or shortness of breath. 08/07/17   Elvina Sidle, MD  benzonatate (TESSALON) 200 MG capsule Take 1 capsule (200 mg total) by mouth 2 (two) times daily as needed for cough. 06/21/18   Eustace Moore, MD  Cetirizine HCl 10 MG CAPS Take 1 capsule (10 mg total) by mouth daily for 10 days. 04/27/18 05/07/18  Wieters, Hallie C, PA-C  famotidine (PEPCID) 20 MG  tablet Take 1 tablet (20 mg total) by mouth at bedtime. 03/17/18   Storm Frisk, MD  hydrochlorothiazide (HYDRODIURIL) 25 MG tablet Take 1 tablet (25 mg total) by mouth daily. 03/17/18   Storm Frisk, MD  loperamide (IMODIUM) 2 MG capsule Take 1 capsule (2 mg total) by mouth 4 (four) times daily as needed for diarrhea or loose stools. 06/21/18   Eustace Moore, MD  losartan (COZAAR) 50 MG tablet Take 1 tablet (50 mg total) by mouth daily. 03/17/18 03/17/19  Storm Frisk, MD  omeprazole (PRILOSEC) 20 MG capsule Take 1 capsule (20 mg total) by mouth daily. 03/17/18   Storm Frisk, MD  ondansetron (ZOFRAN) 4 MG tablet Take 1 tablet (4 mg total) by mouth every 6 (six) hours. 06/21/18   Eustace Moore, MD  polyethylene glycol [facility]) packet Take one capful in large glass several times a day until you go to the bathroom,  then once a day 05/20/18   Elvina Sidle, MD    Family History Family History  Problem Relation Age of Onset  . Hypertension Mother   . Diabetes Mother   . Cancer Mother   . Hypertension Father   . Diabetes Father   . Hyperlipidemia Father   . Arthritis Other   . Asthma Other   . Breast cancer Other   . Heart failure Other   . Congenital heart disease Other   . Depression Other   . Heart attack Other     Social History Social History   Tobacco Use  . Smoking status: Former Smoker    Types: Cigarettes    Last attempt to quit: 10/31/2010    Years since quitting: 7.6  . Smokeless tobacco: Never Used  Substance Use Topics  . Alcohol use: No    Alcohol/week: 0.0 standard drinks  . Drug use: No     Allergies   Bee venom; Lisinopril; and Shellfish allergy   Review of Systems Review of Systems  Constitutional: Positive for fatigue and fever. Negative for chills.  HENT: Positive for congestion, postnasal drip and rhinorrhea. Negative for ear pain and sore throat.   Eyes: Negative for pain and visual disturbance.  Respiratory: Positive for cough. Negative for shortness of breath.   Cardiovascular: Negative for chest pain and palpitations.  Gastrointestinal: Positive for anal bleeding, diarrhea, nausea and vomiting. Negative for abdominal pain.  Genitourinary: Negative for dysuria and hematuria.  Musculoskeletal: Negative for arthralgias and back pain.  Skin: Negative for color change and rash.  Neurological: Negative for seizures and syncope.  All other systems reviewed and are negative.    Physical Exam Triage Vital Signs ED Triage Vitals  Enc Vitals Group     BP 06/21/18 1728 (!) 130/94     Pulse Rate 06/21/18 1728 78     Resp 06/21/18 1728 20     Temp 06/21/18 1728 98.4 F (36.9 C)     Temp Source 06/21/18 1728 Oral     SpO2 06/21/18 1728 99 %     Weight --      Height --      Head Circumference --      Peak Flow --      Pain Score 06/21/18 1729 5      Pain Loc --      Pain Edu? --      Excl. in GC? --    No data found.  Updated Vital Signs BP (!) 130/94 (BP Location: Right Wrist)  Pulse 78   Temp 98.4 F (36.9 C) (Oral)   Resp 20   SpO2 99%      Physical Exam Constitutional:      General: She is not in acute distress.    Appearance: She is well-developed. She is obese. She is ill-appearing.     Comments: Appears tired  HENT:     Head: Normocephalic and atraumatic.     Right Ear: Tympanic membrane, ear canal and external ear normal.     Left Ear: Tympanic membrane, ear canal and external ear normal.     Nose: Nose normal.     Mouth/Throat:     Mouth: Mucous membranes are moist.     Pharynx: No posterior oropharyngeal erythema.     Comments: Well-hydrated Eyes:     Conjunctiva/sclera: Conjunctivae normal.     Pupils: Pupils are equal, round, and reactive to light.  Neck:     Musculoskeletal: Normal range of motion.  Cardiovascular:     Rate and Rhythm: Normal rate and regular rhythm.     Heart sounds: Normal heart sounds.  Pulmonary:     Effort: Pulmonary effort is normal. No respiratory distress.     Breath sounds: Wheezing present.     Comments: Few central lung rhonchi.  Very few scattered wheeze.  Clear with cough Abdominal:     General: There is no distension.     Palpations: Abdomen is soft.  Musculoskeletal: Normal range of motion.  Skin:    General: Skin is warm and dry.  Neurological:     Mental Status: She is alert.  Psychiatric:        Mood and Affect: Mood normal.        Behavior: Behavior normal.      UC Treatments / Results  Labs (all labs ordered are listed, but only abnormal results are displayed) Labs Reviewed - No data to display  EKG None  Radiology No results found.  Procedures Procedures (including critical care time)  Medications Ordered in UC Medications  ondansetron (ZOFRAN-ODT) disintegrating tablet 4 mg (4 mg Oral Given 06/21/18 1757)    Initial Impression /  Assessment and Plan / UC Course  I have reviewed the triage vital signs and the nursing notes.  Pertinent labs & imaging results that were available during my care of the patient were reviewed by me and considered in my medical decision making (see chart for details).     Needs continued symptomatic care.  No evidence for bacterial infection.  I reassured I do not think an antibiotic will help her.  She is given additional time off work Final Clinical Impressions(s) / UC Diagnoses   Final diagnoses:  Viral illness     Discharge Instructions     Drink lots of fluids to prevent dehydration Bland diet as tolerated Take Imodium to slow down the diarrhea.  May also use Pepto-Bismol Take Zofran for nausea and vomiting. Tessalon for coughing Use your albuterol as needed Expect improvement over the next 2 to 3 days   ED Prescriptions    Medication Sig Dispense Auth. Provider   ondansetron (ZOFRAN) 4 MG tablet Take 1 tablet (4 mg total) by mouth every 6 (six) hours. 12 tablet Eustace Moore, MD   loperamide (IMODIUM) 2 MG capsule Take 1 capsule (2 mg total) by mouth 4 (four) times daily as needed for diarrhea or loose stools. 12 capsule Eustace Moore, MD   benzonatate (TESSALON) 200 MG capsule Take 1 capsule (200 mg total)  by mouth 2 (two) times daily as needed for cough. 20 capsule Eustace Moore, MD     Controlled Substance Prescriptions [place] Controlled Substance Registry consulted? Not Applicable   Eustace Moore, MD 06/21/18 Harrietta Guardian

## 2018-06-21 NOTE — Discharge Instructions (Signed)
Drink lots of fluids to prevent dehydration Bland diet as tolerated Take Imodium to slow down the diarrhea.  May also use Pepto-Bismol Take Zofran for nausea and vomiting. Tessalon for coughing Use your albuterol as needed Expect improvement over the next 2 to 3 days

## 2018-06-21 NOTE — ED Triage Notes (Signed)
Pt presents to [facility] for assessment of diarrhea, cough, congestion, nausea, 99.9 temp at home, hot flashes and cold chills, and 2 episodes of emesis in the last 24 hours.

## 2018-06-22 MED FILL — LOSARTAN POTASSIUM 50 MG TA: 50 | 30 days supply | Qty: 30 | Fill #3

## 2018-06-22 MED FILL — HYDROCHLOROTHIAZIDE 25 MG T: 25 | 30 days supply | Qty: 30 | Fill #3

## 2018-06-23 ENCOUNTER — Inpatient Hospital Stay: Payer: Medicaid Other | Admitting: Critical Care Medicine

## 2018-07-11 NOTE — Telephone Encounter (Signed)
error 

## 2018-07-15 MED FILL — LOSARTAN POTASSIUM 50 MG TA: 50 | 30 days supply | Qty: 30 | Fill #4

## 2018-07-15 MED FILL — HYDROCHLOROTHIAZIDE 25 MG T: 25 | 30 days supply | Qty: 30 | Fill #4

## 2018-07-16 ENCOUNTER — Encounter: Payer: Medicaid Other | Admitting: Family Medicine

## 2018-08-11 ENCOUNTER — Other Ambulatory Visit: Payer: Self-pay

## 2018-08-11 ENCOUNTER — Encounter (HOSPITAL_COMMUNITY): Payer: Self-pay

## 2018-08-11 ENCOUNTER — Ambulatory Visit (HOSPITAL_COMMUNITY)
Admission: EM | Admit: 2018-08-11 | Discharge: 2018-08-11 | Disposition: A | Discharge status: Home / Self Care | Payer: Medicaid Other | Attending: Family Medicine | Admitting: Family Medicine

## 2018-08-11 DIAGNOSIS — B9789 Other viral agents as the cause of diseases classified elsewhere: Secondary | ICD-10-CM

## 2018-08-11 DIAGNOSIS — J069 Acute upper respiratory infection, unspecified: Secondary | ICD-10-CM

## 2018-08-11 DIAGNOSIS — J029 Acute pharyngitis, unspecified: Secondary | ICD-10-CM

## 2018-08-11 LAB — POCT RAPID STREP A: Streptococcus, Group A Screen (Direct): NEGATIVE

## 2018-08-11 MED ORDER — LIDOCAINE VISCOUS HCL 2 % MT SOLN: 15.0000 mL | OROMUCOSAL | 0 refills | Status: DC | PRN

## 2018-08-11 MED ORDER — CETIRIZINE-PSEUDOEPHEDRINE ER 5-120 MG PO TB12: 1.0000 | ORAL_TABLET | Freq: Every day | ORAL | 0 refills | Status: DC

## 2018-08-11 NOTE — ED Provider Notes (Signed)
[facility]   [id number] 08/11/18 Arrival Time: 1857   CC: URI symptoms   SUBJECTIVE: History from: patient.  Crystal Burns is a 38 y.o. female hx significant for asthma, HTN, obesity, and depression, who presents with losing her voice and sore throat x 2 days.  Reports daughters with similar symptoms, but states they have not left the house.  No known COVID exposure.  Has tried OTC alka seltzer, robitussin, and dayquil without relief.  Symptoms are made worse with swallowing, but tolerating liquids without difficulty.  Reports previous symptoms in the past related to strep throat.  Also mentions productive cough with white sputum.   Denies fever, chills, fatigue, sinus pain, rhinorrhea, SOB, wheezing, chest pain, chest pressure, nausea, changes in bowel or bladder habits.    ROS: As per HPI.  Past Medical History:  Diagnosis Date  . Asthma    2010-only bothers pt during bad cold  . Atypical chest pain 01/16/2016   Atypical chest pain  . Depression    2010-had when mother passed away  . History of gonorrhea   . Hypertension    2004  . Obesity   . Preeclampsia complicating hypertension 01/21/2015   Past Surgical History:  Procedure Laterality Date  . CHOLECYSTECTOMY    . DILATION AND CURETTAGE OF UTERUS    . WISDOM TOOTH EXTRACTION     Allergies  Allergen Reactions  . Bee Venom Hives and Swelling  . Lisinopril Shortness Of Breath and Cough  . Shellfish Allergy Anaphylaxis   No current facility-administered medications on file prior to encounter.    Current Outpatient Medications on File Prior to Encounter  Medication Sig Dispense Refill  . albuterol (PROVENTIL HFA;VENTOLIN HFA) 108 (90 Base) MCG/ACT inhaler Inhale 2 puffs into the lungs every 4 (four) hours as needed for wheezing or shortness of breath (cough, shortness of breath or wheezing.). 1 Inhaler 3  . albuterol (PROVENTIL) (2.5 MG/3ML) 0.083% nebulizer solution Take 3 mLs (2.5 mg total) by  nebulization every 6 (six) hours as needed for wheezing or shortness of breath. 75 mL 12  . famotidine (PEPCID) 20 MG tablet Take 1 tablet (20 mg total) by mouth at bedtime. 30 tablet 6  . hydrochlorothiazide (HYDRODIURIL) 25 MG tablet Take 1 tablet (25 mg total) by mouth daily. 30 tablet 6  . loperamide (IMODIUM) 2 MG capsule Take 1 capsule (2 mg total) by mouth 4 (four) times daily as needed for diarrhea or loose stools. 12 capsule 0  . losartan (COZAAR) 50 MG tablet Take 1 tablet (50 mg total) by mouth daily. 30 tablet 11  . omeprazole (PRILOSEC) 20 MG capsule Take 1 capsule (20 mg total) by mouth daily. 30 capsule 6  . ondansetron (ZOFRAN) 4 MG tablet Take 1 tablet (4 mg total) by mouth every 6 (six) hours. 12 tablet 0  . polyethylene glycol (MIRALAX) packet Take one capful in large glass several times a day until you go to the bathroom, then once a day 14 each 4   Social History   Socioeconomic History  . Marital status: Single    Spouse name: Not on file  . Number of children: Not on file  . Years of education: Not on file  . Highest education level: Not on file  Occupational History  . Not on file  Social Needs  . Financial resource strain: Not on file  . Food insecurity:    Worry: Not on file    Inability: Not on file  . Transportation  needs:    Medical: Not on file    Non-medical: Not on file  Tobacco Use  . Smoking status: Former Smoker    Types: Cigarettes    Last attempt to quit: 10/31/2010    Years since quitting: 7.7  . Smokeless tobacco: Never Used  Substance and Sexual Activity  . Alcohol use: No    Alcohol/week: 0.0 standard drinks  . Drug use: No  . Sexual activity: Not on file  Lifestyle  . Physical activity:    Days per week: Not on file    Minutes per session: Not on file  . Stress: Not on file  Relationships  . Social connections:    Talks on phone: Not on file    Gets together: Not on file    Attends religious service: Not on file    Active member  of club or organization: Not on file    Attends meetings of clubs or organizations: Not on file    Relationship status: Not on file  . Intimate partner violence:    Fear of current or ex partner: Not on file    Emotionally abused: Not on file    Physically abused: Not on file    Forced sexual activity: Not on file  Other Topics Concern  . Not on file  Social History Narrative  . Not on file   Family History  Problem Relation Age of Onset  . Hypertension Mother   . Diabetes Mother   . Cancer Mother   . Hypertension Father   . Diabetes Father   . Hyperlipidemia Father   . Arthritis Other   . Asthma Other   . Breast cancer Other   . Heart failure Other   . Congenital heart disease Other   . Depression Other   . Heart attack Other     OBJECTIVE:  Vitals:   08/11/18 1936  BP: 131/77  Pulse: 86  Resp: 18  Temp: 98.9 F (37.2 C)  SpO2: 98%     General appearance: alert; appears mildly fatigued, but nontoxic; speaking in full sentences and tolerating own secretions HEENT: NCAT; Ears: EACs clear, TMs pearly gray; Eyes: PERRL.  EOM grossly intact. Nose: nares patent without rhinorrhea, turbinates boggy and pale, Throat: oropharynx clear, tonsils non erythematous, but enlarged, uvula midline  Neck: supple without LAD Lungs: unlabored respirations, symmetrical air entry; cough: mild; no respiratory distress; CTAB Heart: Difficult to assess due to body habitus Skin: warm and dry Psychological: alert and cooperative; normal mood and affect  Results for orders placed or performed during the hospital encounter of 08/11/18 (from the past 24 hour(s))  POCT rapid strep A [facility])     Status: None   Collection Time: 08/11/18  8:08 PM  Result Value Ref Range   Streptococcus, Group A Screen (Direct) NEGATIVE NEGATIVE     ASSESSMENT & PLAN:  1. Viral URI with cough   2. Sore throat     Meds ordered this encounter  Medications  . cetirizine-pseudoephedrine (ZYRTEC-D)  5-120 MG tablet    Sig: Take 1 tablet by mouth daily.    Dispense:  30 tablet    Refill:  0    Order Specific Question:   Supervising Provider    Answer:   Eustace Moore [[id number]]  . lidocaine (XYLOCAINE) 2 % solution    Sig: Use as directed 15 mLs in the mouth or throat as needed for mouth pain.    Dispense:  100 mL  Refill:  0    Order Specific Question:   Supervising Provider    Answer:   Eustace Moore [[id number]]   Strep test negative, will send out for culture and we will call you with results Get plenty of rest and push fluids Zyrtec D prescribed. Use daily for symptomatic relief Viscous lidocaine prescribed.  This is an oral solution you can swish, and gargle as needed for symptomatic relief of sore throat.  Do not exceed 8 doses in a 24 hour period.  Do not use prior to eating, as this will numb your entire mouth.   Drink warm or cool liquids, use throat lozenges, or popsicles to help alleviate symptoms Take OTC ibuprofen or tylenol as needed for pain Follow up with PCP if symptoms persists Return or go to ER if patient has any new or worsening symptoms such as fever, chills, nausea, vomiting, worsening sore throat, cough, abdominal pain, chest pain, changes in bowel or bladder habits, etc...  Reviewed expectations re: course of current medical issues. Questions answered. Outlined signs and symptoms indicating need for more acute intervention. Patient verbalized understanding. After Visit Summary given.         Rennis Harding, PA-C 08/11/18 2026

## 2018-08-11 NOTE — ED Triage Notes (Signed)
Cough and sore throat past 2 days

## 2018-08-11 NOTE — Discharge Instructions (Signed)
Strep test negative, will send out for culture and we will call you with results Get plenty of rest and push fluids Zyrtec D prescribed. Use daily for symptomatic relief Viscous lidocaine prescribed.  This is an oral solution you can swish, and gargle as needed for symptomatic relief of sore throat.  Do not exceed 8 doses in a 24 hour period.  Do not use prior to eating, as this will numb your entire mouth.   Drink warm or cool liquids, use throat lozenges, or popsicles to help alleviate symptoms Take OTC ibuprofen or tylenol as needed for pain Follow up with PCP if symptoms persists Return or go to ER if patient has any new or worsening symptoms such as fever, chills, nausea, vomiting, worsening sore throat, cough, abdominal pain, chest pain, changes in bowel or bladder habits, etc. 

## 2018-08-20 MED FILL — HYDROCHLOROTHIAZIDE 25 MG T: 25 | 30 days supply | Qty: 30 | Fill #5

## 2018-08-20 MED FILL — LOSARTAN POTASSIUM 50 MG TA: 50 | 30 days supply | Qty: 30 | Fill #5

## 2018-09-01 ENCOUNTER — Inpatient Hospital Stay: Payer: Medicaid Other | Admitting: Family Medicine

## 2018-09-22 MED FILL — LOSARTAN POTASSIUM 50 MG TA: 50 | 30 days supply | Qty: 30 | Fill #6

## 2018-10-21 MED FILL — LOSARTAN POTASSIUM 50 MG TA: 50 | 30 days supply | Qty: 30 | Fill #7

## 2018-10-21 MED FILL — HYDROCHLOROTHIAZIDE 25 MG T: 25 | 30 days supply | Qty: 30 | Fill #6

## 2018-11-05 ENCOUNTER — Ambulatory Visit: Payer: Medicaid Other | Attending: Family Medicine | Admitting: Physician Assistant

## 2018-11-05 DIAGNOSIS — R5383 Other fatigue: Secondary | ICD-10-CM

## 2018-11-05 DIAGNOSIS — Z131 Encounter for screening for diabetes mellitus: Secondary | ICD-10-CM | POA: Diagnosis not present

## 2018-11-05 DIAGNOSIS — R609 Edema, unspecified: Secondary | ICD-10-CM | POA: Diagnosis not present

## 2018-11-05 NOTE — Progress Notes (Signed)
Pt. Stated her right knee is swelling and giving her pain. Pt. Stated she want to get Depo shot.   Pt. Think her blood pressure dose is too strong for her.

## 2018-11-05 NOTE — Progress Notes (Signed)
Virtual Visit via phone Note  I connected with Crystal Burns on 11/05/18 at  1:30 PM EDT by a video enabled telemedicine application and verified that I am speaking with the correct person using two identifiers.   I discussed the limitations of evaluation and management by telemedicine and the availability of in person appointments. The patient expressed understanding and agreed to proceed.  Patient location:  home My Location:  [place] office Persons on the call:  Me and the patient  History of Present Illness: Sometimes feeling weak and light headed for about 1 month.  No HA/CP/SOB.  Admits to no/poor water intake.  No polyuria/polydipsia.  No abdominal pain.  Appetite is normal.  Compliant with meds.  No recent med changes BP ~110/70-80.    Also, overdue for depoprovera and would like to get started back on it.  Not currently SA.    Also, has been having B leg swelling.  Working 8 hrs a day while sitting.  Admits to being sedentary and little to no exercise.  No erythema or warmth.  No recent travel.    Observations/Objective: A&Ox3  Assessment and Plan: 1. Fatigue, unspecified type - Basic metabolic panel; Future - TSH; Future  2. Screening for diabetes mellitus -I have had a lengthy discussion and provided education about insulin resistance and the intake of too much sugar/refined carbohydrates.  I have advised the patient to work at a goal of eliminating sugary drinks, candy, desserts, sweets, refined sugars, processed foods, and white carbohydrates.  The patient expresses understanding.  - Hemoglobin A1c; Future  3. Edema, unspecified type -sounds like dependent edema.  Exercise.  Get up and walk around from work station frequently.  Drink 80-100 ounces of water daily.  Labs scheduled for early next week  4.  Make nurse appt for next week for Depo Provera and labs   Follow Up Instructions: Make nurse appt for next week for Depo Provera and labs   I discussed the  assessment and treatment plan with the patient. The patient was provided an opportunity to ask questions and all were answered. The patient agreed with the plan and demonstrated an understanding of the instructions.   The patient was advised to call back or seek an in-person evaluation if the symptoms worsen or if the condition fails to improve as anticipated.  I provided 15 minutes of non-face-to-face time during this encounter.   Freeman Caldron, PA-C  Patient ID: Crystal Burns, female   DOB: April 07, 1981, 38 y.o.   MRN: [id number]

## 2018-11-10 ENCOUNTER — Ambulatory Visit: Payer: Medicaid Other

## 2018-11-10 ENCOUNTER — Other Ambulatory Visit: Payer: Medicaid Other

## 2018-11-13 MED FILL — AMOXICILLIN 500 MG CAPSULE: 500 | 7 days supply | Qty: 21 | Fill #0

## 2018-11-20 ENCOUNTER — Other Ambulatory Visit: Payer: Self-pay

## 2018-11-20 ENCOUNTER — Encounter: Payer: Self-pay | Admitting: Family Medicine

## 2018-11-20 ENCOUNTER — Ambulatory Visit (HOSPITAL_BASED_OUTPATIENT_CLINIC_OR_DEPARTMENT_OTHER): Payer: Medicaid Other | Admitting: *Deleted

## 2018-11-20 ENCOUNTER — Ambulatory Visit: Payer: Medicaid Other | Attending: Family Medicine

## 2018-11-20 DIAGNOSIS — R5383 Other fatigue: Secondary | ICD-10-CM

## 2018-11-20 DIAGNOSIS — Z309 Encounter for contraceptive management, unspecified: Secondary | ICD-10-CM | POA: Diagnosis not present

## 2018-11-20 DIAGNOSIS — Z131 Encounter for screening for diabetes mellitus: Secondary | ICD-10-CM

## 2018-11-20 LAB — POCT URINE PREGNANCY: Preg Test, Ur: NEGATIVE

## 2018-11-20 MED ORDER — MEDROXYPROGESTERONE ACETATE 150 MG/ML IM SUSY
150.0000 mg | PREFILLED_SYRINGE | Freq: Once | INTRAMUSCULAR | Status: AC
  Administered 2018-11-20: 150 mg via INTRAMUSCULAR

## 2018-11-20 NOTE — Progress Notes (Signed)
Last Depo was:03-18-2019 Pregnancy test obtained today and results was: Negative Depo:150 mg/ML Given by: O. Erez Mccallum, [place] Patient did not have any c/o Given in: Right [place] Next Depo: October 30-February 19, 2019

## 2018-11-21 LAB — HEMOGLOBIN A1C
Est. average glucose Bld gHb Est-mCnc: 137 mg/dL
Hgb A1c MFr Bld: 6.4 % — ABNORMAL HIGH (ref 4.8–5.6)

## 2018-11-21 LAB — BASIC METABOLIC PANEL
BUN/Creatinine Ratio: 22 (ref 9–23)
BUN: 15 mg/dL (ref 6–20)
CO2: 25 mmol/L (ref 20–29)
Calcium: 9.9 mg/dL (ref 8.7–10.2)
Chloride: 100 mmol/L (ref 96–106)
Creatinine, Ser: 0.69 mg/dL (ref 0.57–1.00)
GFR calc Af Amer: 129 mL/min/{1.73_m2} (ref >59)
GFR calc non Af Amer: 112 mL/min/{1.73_m2} (ref >59)
Glucose: 105 mg/dL — ABNORMAL HIGH (ref 65–99)
Potassium: 4 mmol/L (ref 3.5–5.2)
Sodium: 138 mmol/L (ref 134–144)

## 2018-11-21 LAB — TSH: TSH: 2.47 u[IU]/mL (ref 0.450–4.500)

## 2018-11-23 ENCOUNTER — Other Ambulatory Visit: Payer: Self-pay | Admitting: Physician Assistant

## 2018-11-23 DIAGNOSIS — E1165 Type 2 diabetes mellitus with hyperglycemia: Secondary | ICD-10-CM

## 2018-11-23 MED ORDER — METFORMIN HCL 500 MG PO TABS: 500.0000 mg | ORAL_TABLET | Freq: Two times a day (BID) | ORAL | 3 refills | Status: DC

## 2018-11-23 MED FILL — metFORMIN HCL 500 MG TABS: 500 | 30 days supply | Qty: 60 | Fill #0

## 2018-11-24 ENCOUNTER — Telehealth: Payer: Self-pay | Admitting: Family Medicine

## 2018-11-24 MED FILL — LOSARTAN POTASSIUM 50 MG TA: 50 | 30 days supply | Qty: 30 | Fill #8

## 2018-11-24 NOTE — Telephone Encounter (Signed)
Patient would like a call in regards to her results and the medication she was prescribed. Please follow up.

## 2018-12-23 ENCOUNTER — Other Ambulatory Visit: Payer: Self-pay | Admitting: Critical Care Medicine

## 2018-12-23 MED FILL — HYDROCHLOROTHIAZIDE 25 MG T: 25 | 30 days supply | Qty: 30 | Fill #0

## 2018-12-23 MED FILL — LOSARTAN POTASSIUM 50 MG TA: 50 | 30 days supply | Qty: 30 | Fill #9

## 2019-01-22 MED FILL — LOSARTAN POTASSIUM 50 MG TA: 50 | 30 days supply | Qty: 30 | Fill #10

## 2019-01-29 MED FILL — metFORMIN HCL 500 MG TABS: 500 | 30 days supply | Qty: 60 | Fill #1

## 2019-02-05 MED FILL — HYDROCHLOROTHIAZIDE 25 MG T: 25 | 30 days supply | Qty: 30 | Fill #1

## 2019-02-24 MED FILL — LOSARTAN POTASSIUM 50 MG TA: 50 | 30 days supply | Qty: 30 | Fill #11

## 2019-03-15 MED FILL — HYDROCHLOROTHIAZIDE 25 MG T: 25 | 30 days supply | Qty: 30 | Fill #2

## 2019-03-23 ENCOUNTER — Other Ambulatory Visit: Payer: Self-pay

## 2019-03-23 ENCOUNTER — Ambulatory Visit (HOSPITAL_COMMUNITY)
Admission: EM | Admit: 2019-03-23 | Discharge: 2019-03-23 | Disposition: A | Discharge status: Home / Self Care | Payer: Medicaid Other | Attending: Physician Assistant | Admitting: Physician Assistant

## 2019-03-23 DIAGNOSIS — R0789 Other chest pain: Secondary | ICD-10-CM

## 2019-03-23 NOTE — ED Provider Notes (Signed)
[facility]    CSN: [id number] Arrival date & time: 03/23/19  1426      History   Chief Complaint Chief Complaint  Patient presents with  . Abdominal Pain  . Chest Pain    HPI Crystal Burns is a 38 y.o. female.   Patient reports to urgent care today for complaint of chest pain yesterday into today. She reports a sudden episode of center chest pain after eating. The pain went away quickly in the center of her chest but she reports a continued soreness on the left side of her chest going into her back. She reports the chest pain in the center "felt like a gas bubble" and reports she was having abdominal gas as well. She states the soreness in the left side is worse when she moves her arm or laying on that side. She denies nausea, sweating , feeling short of breath or fatigued when having the pains. She took motrin which she reports helped somewhat. She does report a history of having similar soreness on the left side of her chest that she believes is from her "bras being tight".     Of note, she is only reporting the left sided soreness today and has no other symptoms at present.   She does add to her medical history that she is a diabetic and has high blood pressure.      Past Medical History:  Diagnosis Date  . Asthma    2010-only bothers pt during bad cold  . Atypical chest pain 01/16/2016   Atypical chest pain  . Depression    2010-had when mother passed away  . History of gonorrhea   . Hypertension    2004  . Obesity   . Preeclampsia complicating hypertension 01/21/2015    Patient Active Problem List   Diagnosis Date Noted  . Hoarseness of voice 03/17/2018  . Gastroesophageal reflux disease without esophagitis 03/17/2018  . Supervision of normal pregnancy, antepartum 12/15/2017  . Acute medial meniscus tear of right knee 02/10/2017  . Vitamin D deficiency 07/10/2015  . Anxiety 06/20/2015  . Costochondritis 05/18/2015  . Primary osteoarthritis  of right knee 04/22/2014  . Essential hypertension 07/18/2012  . LUMBOSACRAL STRAIN, ACUTE 07/05/2008  . Morbid obesity (HCC) 02/24/2008  . Asthma with exacerbation 02/24/2008    Past Surgical History:  Procedure Laterality Date  . CHOLECYSTECTOMY    . DILATION AND CURETTAGE OF UTERUS    . WISDOM TOOTH EXTRACTION      OB History    Gravida  11   Para  7   Term  7   Preterm  0   AB  4   Living  6     SAB  4   TAB  0   Ectopic  0   Multiple  0   Live Births  7            Home Medications    Prior to Admission medications   Medication Sig Start Date End Date Taking? Authorizing Provider  albuterol (PROVENTIL HFA;VENTOLIN HFA) 108 (90 Base) MCG/ACT inhaler Inhale 2 puffs into the lungs every 4 (four) hours as needed for wheezing or shortness of breath (cough, shortness of breath or wheezing.). 03/17/18   Storm FriskWright, Patrick E, MD  albuterol (PROVENTIL) (2.5 MG/3ML) 0.083% nebulizer solution Take 3 mLs (2.5 mg total) by nebulization every 6 (six) hours as needed for wheezing or shortness of breath. 08/07/17   Elvina SidleLauenstein, Kurt, MD  cetirizine-pseudoephedrine (ZYRTEC-D) 5-120  MG tablet Take 1 tablet by mouth daily. 08/11/18   Wurst, [place], PA-C  famotidine (PEPCID) 20 MG tablet Take 1 tablet (20 mg total) by mouth at bedtime. 03/17/18   Elsie Stain, MD  hydrochlorothiazide (HYDRODIURIL) 25 MG tablet TAKE 1 TABLET (25 MG TOTAL) BY MOUTH DAILY. 12/23/18   Charlott Rakes, MD  lidocaine (XYLOCAINE) 2 % solution Use as directed 15 mLs in the mouth or throat as needed for mouth pain. 08/11/18   Wurst, [place], PA-C  loperamide (IMODIUM) 2 MG capsule Take 1 capsule (2 mg total) by mouth 4 (four) times daily as needed for diarrhea or loose stools. 06/21/18   Raylene Everts, MD  losartan (COZAAR) 50 MG tablet Take 1 tablet (50 mg total) by mouth daily. 03/17/18 03/17/19  Elsie Stain, MD  metFORMIN (GLUCOPHAGE) 500 MG tablet Take 1 tablet (500 mg total) by mouth 2 (two)  times daily with a meal. 11/23/18   McClung, Dionne Bucy, PA-C  omeprazole (PRILOSEC) 20 MG capsule Take 1 capsule (20 mg total) by mouth daily. 03/17/18   Elsie Stain, MD  ondansetron (ZOFRAN) 4 MG tablet Take 1 tablet (4 mg total) by mouth every 6 (six) hours. 06/21/18   Raylene Everts, MD  polyethylene glycol [facility]) packet Take one capful in large glass several times a day until you go to the bathroom, then once a day 05/20/18   Robyn Haber, MD    Family History Family History  Problem Relation Age of Onset  . Hypertension Mother   . Diabetes Mother   . Cancer Mother   . Hypertension Father   . Diabetes Father   . Hyperlipidemia Father   . Arthritis Other   . Asthma Other   . Breast cancer Other   . Heart failure Other   . Congenital heart disease Other   . Depression Other   . Heart attack Other     Social History Social History   Tobacco Use  . Smoking status: Former Smoker    Types: Cigarettes    Quit date: 10/31/2010    Years since quitting: 8.3  . Smokeless tobacco: Never Used  Substance Use Topics  . Alcohol use: No    Alcohol/week: 0.0 standard drinks  . Drug use: No     Allergies   Bee venom, Lisinopril, and Shellfish allergy   Review of Systems Review of Systems  Constitutional: Negative for chills, diaphoresis, fatigue and fever.  HENT: Negative for congestion, ear pain and sore throat.   Eyes: Negative for pain and visual disturbance.  Respiratory: Negative for cough and shortness of breath.   Cardiovascular: Negative for chest pain and palpitations.  Gastrointestinal: Negative for abdominal distention, abdominal pain, constipation, diarrhea, nausea and vomiting.  Genitourinary: Negative for dysuria, flank pain and hematuria.  Musculoskeletal: Negative for arthralgias, back pain and myalgias.  Skin: Negative for color change and rash.  Neurological: Negative for dizziness, syncope, light-headedness and headaches.  All other systems  reviewed and are negative.    Physical Exam Triage Vital Signs ED Triage Vitals  Enc Vitals Group     BP 03/23/19 1456 (!) 143/85     Pulse Rate 03/23/19 1456 83     Resp 03/23/19 1456 (!) 22     Temp 03/23/19 1456 98.6 F (37 C)     Temp Source 03/23/19 1456 Oral     SpO2 03/23/19 1456 98 %     Weight 03/23/19 1454 (!) 328 lb (148.8 kg)  Height --      Head Circumference --      Peak Flow --      Pain Score 03/23/19 1454 8     Pain Loc --      Pain Edu? --      Excl. in GC? --    No data found.  Updated Vital Signs BP (!) 143/85 (BP Location: Left Wrist)   Pulse 83   Temp 98.6 F (37 C) (Oral)   Resp (!) 22   Wt (!) 328 lb (148.8 kg)   SpO2 98%   BMI 58.10 kg/m   Visual Acuity Right Eye Distance:   Left Eye Distance:   Bilateral Distance:    Right Eye Near:   Left Eye Near:    Bilateral Near:     Physical Exam Vitals and nursing note reviewed.  Constitutional:      General: She is not in acute distress.    Appearance: She is well-developed. She is obese. She is not ill-appearing.  HENT:     Head: Normocephalic and atraumatic.     Mouth/Throat:     Mouth: Mucous membranes are moist.  Eyes:     Extraocular Movements: Extraocular movements intact.     Conjunctiva/sclera: Conjunctivae normal.  Cardiovascular:     Rate and Rhythm: Normal rate and regular rhythm.     Heart sounds: Normal heart sounds. No murmur. No friction rub. No gallop.   Pulmonary:     Effort: Pulmonary effort is normal. No respiratory distress.     Breath sounds: Normal breath sounds.  Abdominal:     Palpations: Abdomen is soft.     Tenderness: There is no abdominal tenderness.  Musculoskeletal:     Cervical back: Neck supple.  Skin:    General: Skin is warm and dry.     Capillary Refill: Capillary refill takes less than 2 seconds.     Coloration: Skin is not cyanotic.  Neurological:     General: No focal deficit present.     Mental Status: She is alert and oriented to  person, place, and time.  Psychiatric:        Mood and Affect: Mood normal.        Behavior: Behavior normal.      UC Treatments / Results  Labs (all labs ordered are listed, but only abnormal results are displayed) Labs Reviewed - No data to display  EKG NSR with left axis deviation. No ST Elevation  Radiology No results found.  Procedures Procedures (including critical care time)  Medications Ordered in UC Medications - No data to display  Initial Impression / Assessment and Plan / UC Course  I have reviewed the triage vital signs and the nursing notes.  Pertinent labs & imaging results that were available during my care of the patient were reviewed by me and considered in my medical decision making (see chart for details).     Chest Wall Pain - Patient with non-cardiac chest pain. Risk factors of hypertension and diabetes present but reproducible tenderness and reassuring EKG. Heart Score of 1, risk factors only. This gives merit to chest wall pain as opposed to cardiac and reassurance for 1-2 week follow up with primary care. Recommended tylenol or motrin for chest wall pain and clothing adjustments. Strict ED precautions were discussed.    Final Clinical Impressions(s) / UC Diagnoses   Final diagnoses:  Chest wall pain     Discharge Instructions     Take ibuprofen or tylenol  as needed for the pain.  Utilize heat or ice as needed at your preference. Wear looser fitting clothing for the time being and attempt to sleep on your Right side.  Your ECG did not reveal anything concerning today.   Follow up with your Primary Care provider within 1-2 weeks to re-evaluate your chest wall pain.   Go to the Emergency Department if you experience severe crushing chest pain, become short of breath, feel like you are going to pass out.         ED Prescriptions    None     PDMP not reviewed this encounter.   Hermelinda Medicus, PA-C 03/23/19 [id number]

## 2019-03-23 NOTE — ED Triage Notes (Signed)
Pt. states she was having chest tightness last night, her left arm is in pain when moving, very achy all over.

## 2019-03-23 NOTE — Discharge Instructions (Addendum)
Take ibuprofen or tylenol as needed for the pain.  Utilize heat or ice as needed at your preference. Wear looser fitting clothing for the time being and attempt to sleep on your Right side.  Your ECG did not reveal anything concerning today.   Follow up with your Primary Care provider within 1-2 weeks to re-evaluate your chest wall pain.   Go to the Emergency Department if you experience severe crushing chest pain, become short of breath, feel like you are going to pass out.

## 2019-03-26 ENCOUNTER — Other Ambulatory Visit: Payer: Self-pay | Admitting: Pharmacist

## 2019-03-26 ENCOUNTER — Other Ambulatory Visit: Payer: Self-pay | Admitting: Critical Care Medicine

## 2019-03-26 MED ORDER — LOSARTAN POTASSIUM 50 MG PO TABS: 50.0000 mg | ORAL_TABLET | Freq: Every day | ORAL | 0 refills | Status: DC

## 2019-03-26 MED FILL — LOSARTAN POTASSIUM 50 MG TA: 50 | 30 days supply | Qty: 30 | Fill #0

## 2019-03-26 NOTE — Telephone Encounter (Signed)
Pt states out of meds and wants to pick up today before close

## 2019-04-06 MED FILL — metFORMIN HCL 500 MG TABS: 500 | 30 days supply | Qty: 60 | Fill #2

## 2019-04-07 NOTE — Progress Notes (Deleted)
Patient ID: Crystal Burns, female   DOB: 1980-04-15, 38 y.o.   MRN: [id number] After ED visit 03/18/2019 for chest wall pain.  From ED A/P: Chest Wall Pain - Patient with non-cardiac chest pain. Risk factors of hypertension and diabetes present but reproducible tenderness and reassuring EKG. Heart Score of 1, risk factors only. This gives merit to chest wall pain as opposed to cardiac and reassurance for 1-2 week follow up with primary care. Recommended tylenol or motrin for chest wall pain and clothing adjustments. Strict ED precautions were discussed.

## 2019-04-08 ENCOUNTER — Inpatient Hospital Stay: Payer: Medicaid Other

## 2019-04-20 MED FILL — HYDROCHLOROTHIAZIDE 25 MG T: 25 | 30 days supply | Qty: 30 | Fill #3

## 2019-04-22 ENCOUNTER — Other Ambulatory Visit: Payer: Self-pay | Admitting: Critical Care Medicine

## 2019-04-22 ENCOUNTER — Other Ambulatory Visit: Payer: Self-pay

## 2019-04-22 ENCOUNTER — Ambulatory Visit: Payer: Medicaid Other | Attending: Family Medicine | Admitting: Physician Assistant

## 2019-04-22 ENCOUNTER — Other Ambulatory Visit: Payer: Self-pay | Admitting: Family Medicine

## 2019-04-22 DIAGNOSIS — Z8249 Family history of ischemic heart disease and other diseases of the circulatory system: Secondary | ICD-10-CM

## 2019-04-22 DIAGNOSIS — I10 Essential (primary) hypertension: Secondary | ICD-10-CM | POA: Diagnosis not present

## 2019-04-22 DIAGNOSIS — R079 Chest pain, unspecified: Secondary | ICD-10-CM

## 2019-04-22 DIAGNOSIS — K219 Gastro-esophageal reflux disease without esophagitis: Secondary | ICD-10-CM

## 2019-04-22 DIAGNOSIS — Z3202 Encounter for pregnancy test, result negative: Secondary | ICD-10-CM

## 2019-04-22 DIAGNOSIS — R0789 Other chest pain: Secondary | ICD-10-CM

## 2019-04-22 DIAGNOSIS — E1165 Type 2 diabetes mellitus with hyperglycemia: Secondary | ICD-10-CM

## 2019-04-22 DIAGNOSIS — R635 Abnormal weight gain: Secondary | ICD-10-CM | POA: Diagnosis not present

## 2019-04-22 DIAGNOSIS — Z309 Encounter for contraceptive management, unspecified: Secondary | ICD-10-CM | POA: Diagnosis not present

## 2019-04-22 DIAGNOSIS — H66001 Acute suppurative otitis media without spontaneous rupture of ear drum, right ear: Secondary | ICD-10-CM | POA: Diagnosis not present

## 2019-04-22 DIAGNOSIS — Z09 Encounter for follow-up examination after completed treatment for conditions other than malignant neoplasm: Secondary | ICD-10-CM

## 2019-04-22 LAB — POCT URINE PREGNANCY: Preg Test, Ur: NEGATIVE

## 2019-04-22 MED ORDER — METFORMIN HCL 500 MG PO TABS: 500.0000 mg | ORAL_TABLET | Freq: Two times a day (BID) | ORAL | 3 refills | Status: DC

## 2019-04-22 MED ORDER — AMOXICILLIN 500 MG PO CAPS: 500.0000 mg | ORAL_CAPSULE | Freq: Three times a day (TID) | ORAL | 0 refills | Status: DC

## 2019-04-22 MED ORDER — HYDROCHLOROTHIAZIDE 25 MG PO TABS: 25.0000 mg | ORAL_TABLET | Freq: Every day | ORAL | 6 refills | Status: DC

## 2019-04-22 MED ORDER — LOSARTAN POTASSIUM 50 MG PO TABS: 50.0000 mg | ORAL_TABLET | Freq: Every day | ORAL | 2 refills | Status: DC

## 2019-04-22 MED ORDER — MEDROXYPROGESTERONE ACETATE 150 MG/ML IM SUSP
150.0000 mg | Freq: Once | INTRAMUSCULAR | Status: AC
  Administered 2019-04-22: 16:00:00 150 mg via INTRAMUSCULAR

## 2019-04-22 MED ORDER — FAMOTIDINE 20 MG PO TABS: 20.0000 mg | ORAL_TABLET | Freq: Every day | ORAL | 6 refills | Status: DC

## 2019-04-22 MED FILL — LOSARTAN POTASSIUM 50 MG TA: 50 | 30 days supply | Qty: 30 | Fill #0

## 2019-04-22 MED FILL — AMOXICILLIN 500 MG CAPSULE: 500 | 10 days supply | Qty: 30 | Fill #0

## 2019-04-22 MED FILL — FAMOTIDINE 20 MG TABS: 20 | 30 days supply | Qty: 30 | Fill #0

## 2019-04-22 NOTE — Progress Notes (Signed)
Virtual Visit via Telephone Note  I connected with Crystal Burns on 04/22/19 at  3:30 PM EST by telephone and verified that I am speaking with the correct person using two identifiers.   I discussed the limitations, risks, security and privacy concerns of performing an evaluation and management service by telephone and the availability of in person appointments. I also discussed with the patient that there may be a patient responsible charge related to this service. The patient expressed understanding and agreed to proceed.   History of Present Illness: Was seen in the ED for CP 03/23/2019.  EKG unremarkable.  No further CP.  She wants to see a cardiologist.   Her mom died of MI at age 108.  No SOB.  No palpitations.  Also c/o weight gain  Having R ear pain and down into neck.  Ear is popping.  Started yesterday.  No fever.    Doesn't check blood sugars.  Sometimes BP 150s /90s.  Sometimes 120/80.  Some HA.      Observations/Objective: NAD.  A&Ox3  Assessment and Plan: 1. Type 2 diabetes mellitus with hyperglycemia, without long-term current use of insulin (HCC) Assess control - metFORMIN (GLUCOPHAGE) 500 MG tablet; Take 1 tablet (500 mg total) by mouth 2 (two) times daily with a meal.  Dispense: 180 tablet; Refill: 3 - Hemoglobin A1c  2. Encounter for contraceptive management, unspecified type - medroxyPROGESTERone (DEPO-PROVERA) injection 150 mg - POCT urine pregnancy  3. Chest pain, unspecified type Call 911 if develops again or is severe esp given FH.   - Ambulatory referral to Cardiology  4. Essential hypertension continue - hydrochlorothiazide (HYDRODIURIL) 25 MG tablet; Take 1 tablet (25 mg total) by mouth daily.  Dispense: 30 tablet; Refill: 6 - losartan (COZAAR) 50 MG tablet; Take 1 tablet (50 mg total) by mouth daily.  Dispense: 30 tablet; Refill: 2 - Comprehensive metabolic panel  5. Acute suppurative otitis media of right ear without spontaneous rupture of  tympanic membrane, recurrence not specified - amoxicillin (AMOXIL) 500 MG capsule; Take 1 capsule (500 mg total) by mouth 3 (three) times daily.  Dispense: 30 capsule; Refill: 0  6. Family history of heart attack - Ambulatory referral to Cardiology  7. Weight gain - TSH  8. Gastroesophageal reflux disease without esophagitis - famotidine (PEPCID) 20 MG tablet; Take 1 tablet (20 mg total) by mouth at bedtime.  Dispense: 30 tablet; Refill: 6  9. Encounter for examination following treatment at hospital   Follow Up Instructions: See PCP in 1-2 months   I discussed the assessment and treatment plan with the patient. The patient was provided an opportunity to ask questions and all were answered. The patient agreed with the plan and demonstrated an understanding of the instructions.   The patient was advised to call back or seek an in-person evaluation if the symptoms worsen or if the condition fails to improve as anticipated.  I provided 16 minutes of non-face-to-face time during this encounter.   Georgian Co, PA-C  Patient ID: Crystal Burns, female   DOB: [birth year], 39 y.o.   MRN: [id number]

## 2019-04-22 NOTE — Progress Notes (Signed)
Ear and neck pain. No more chest pain. Medication refill.

## 2019-04-23 ENCOUNTER — Other Ambulatory Visit: Payer: Self-pay | Admitting: Physician Assistant

## 2019-04-23 DIAGNOSIS — E1165 Type 2 diabetes mellitus with hyperglycemia: Secondary | ICD-10-CM

## 2019-04-23 LAB — COMPREHENSIVE METABOLIC PANEL
ALT: 14 IU/L (ref 0–32)
AST: 13 IU/L (ref 0–40)
Albumin/Globulin Ratio: 1.4 (ref 1.2–2.2)
Albumin: 3.8 g/dL (ref 3.8–4.8)
Alkaline Phosphatase: 85 IU/L (ref 39–117)
BUN/Creatinine Ratio: 16 (ref 9–23)
BUN: 10 mg/dL (ref 6–20)
Bilirubin Total: 0.3 mg/dL (ref 0.0–1.2)
CO2: 27 mmol/L (ref 20–29)
Calcium: 9.9 mg/dL (ref 8.7–10.2)
Chloride: 104 mmol/L (ref 96–106)
Creatinine, Ser: 0.62 mg/dL (ref 0.57–1.00)
GFR calc Af Amer: 132 mL/min/{1.73_m2} (ref >59)
GFR calc non Af Amer: 115 mL/min/{1.73_m2} (ref >59)
Globulin, Total: 2.7 g/dL (ref 1.5–4.5)
Glucose: 92 mg/dL (ref 65–99)
Potassium: 4.2 mmol/L (ref 3.5–5.2)
Sodium: 141 mmol/L (ref 134–144)
Total Protein: 6.5 g/dL (ref 6.0–8.5)

## 2019-04-23 LAB — HEMOGLOBIN A1C
Est. average glucose Bld gHb Est-mCnc: 134 mg/dL
Hgb A1c MFr Bld: 6.3 % — ABNORMAL HIGH (ref 4.8–5.6)

## 2019-04-23 LAB — TSH: TSH: 1.95 u[IU]/mL (ref 0.450–4.500)

## 2019-04-23 MED ORDER — METFORMIN HCL 500 MG PO TABS: 1000.0000 mg | ORAL_TABLET | Freq: Two times a day (BID) | ORAL | 1 refills | Status: DC

## 2019-04-23 MED FILL — metFORMIN HCL 500 MG TABS: 500 | 90 days supply | Qty: 360 | Fill #0

## 2019-04-26 ENCOUNTER — Telehealth: Payer: Self-pay | Admitting: *Deleted

## 2019-04-26 NOTE — Telephone Encounter (Addendum)
Patient verified DOB Patient is aware of DM still not being under 6. Patient is aware of increasing the metformin to aide in getting her A1C under 6.

## 2019-04-26 NOTE — Telephone Encounter (Signed)
-----   Message from Anders Simmonds, [place] sent at 04/23/2019  1:37 PM EST ----- Your diabetes is not controlled.  Take 2 metformin twice daily.  I sent you a new prescription at the new dose.  Other labs look good including kidney function, liver function, electrolytes, and thyroid are all normal.  Eat less sugar and white carbohydrates.  Follow-up as planned.  Thanks, Georgian Co, PA-C

## 2019-05-03 ENCOUNTER — Encounter: Payer: Self-pay | Admitting: Cardiology

## 2019-05-03 DIAGNOSIS — Z7189 Other specified counseling: Secondary | ICD-10-CM | POA: Insufficient documentation

## 2019-05-03 DIAGNOSIS — R072 Precordial pain: Secondary | ICD-10-CM | POA: Insufficient documentation

## 2019-05-03 NOTE — Progress Notes (Signed)
Cardiology Office Note   Date:  05/04/2019   ID:  RAEVIN WIERENGA, DOB 08/26/1980, MRN [id number]  PCP:  Hoy Register, MD  Cardiologist:   No primary care provider on file. Referring:  Hoy Register, MD  Chief Complaint  Patient presents with  . Chest Pain      History of Present Illness: Crystal Burns is a 39 y.o. female who is referred by Hoy Register, MD for evaluation of chest pain.  .  She saw Dr. Allyson Sabal in 2017 for chest pain .  Her mother had died suddenly at age 67 of an MI in 80.  Stress test was ordered but I don't see that she had that.     She returns for follow-up of chest discomfort.  This is been off and on.  She was in urgent care on 03/23/2019 and I looked at these records.  She had a normal EKG.  She says the discomfort was fleeting and it was a left-sided sharp discomfort.  She had to catch her breath.  It was off and on throughout the day.  There was no radiation to her arm.  It happened at rest.  She did not have associated nausea vomiting or diaphoresis.  She was as mentioned somewhat short of breath.  She might get some chest discomfort occasionally with emotional stress.  However, she has been very inactive particularly with Covid.  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  She does have 6 children at home.  She does not describe PND or orthopnea.  She said no new palpitations, presyncope or syncope.  She has had some weight gain.   Past Medical History:  Diagnosis Date  . Asthma    2010-only bothers pt during bad cold  . Depression    2010-had when mother passed away  . History of gonorrhea   . Hypertension    2004  . Obesity   . Pre-diabetes   . Preeclampsia complicating hypertension 01/21/2015    Past Surgical History:  Procedure Laterality Date  . CHOLECYSTECTOMY    . DILATION AND CURETTAGE OF UTERUS    . WISDOM TOOTH EXTRACTION       Current Outpatient Medications  Medication Sig Dispense Refill  . albuterol (PROVENTIL  HFA;VENTOLIN HFA) 108 (90 Base) MCG/ACT inhaler Inhale 2 puffs into the lungs every 4 (four) hours as needed for wheezing or shortness of breath (cough, shortness of breath or wheezing.). 1 Inhaler 3  . albuterol (PROVENTIL) (2.5 MG/3ML) 0.083% nebulizer solution Take 3 mLs (2.5 mg total) by nebulization every 6 (six) hours as needed for wheezing or shortness of breath. 75 mL 12  . amoxicillin (AMOXIL) 500 MG capsule Take 1 capsule (500 mg total) by mouth 3 (three) times daily. 30 capsule 0  . cetirizine-pseudoephedrine (ZYRTEC-D) 5-120 MG tablet Take 1 tablet by mouth daily. 30 tablet 0  . famotidine (PEPCID) 20 MG tablet Take 1 tablet (20 mg total) by mouth at bedtime. 30 tablet 6  . hydrochlorothiazide (HYDRODIURIL) 25 MG tablet Take 1 tablet (25 mg total) by mouth daily. 30 tablet 6  . lidocaine (XYLOCAINE) 2 % solution Use as directed 15 mLs in the mouth or throat as needed for mouth pain. 100 mL 0  . loperamide (IMODIUM) 2 MG capsule Take 1 capsule (2 mg total) by mouth 4 (four) times daily as needed for diarrhea or loose stools. 12 capsule 0  . losartan (COZAAR) 50 MG tablet Take 1 tablet (50 mg total) by mouth  daily. 30 tablet 2  . metFORMIN (GLUCOPHAGE) 500 MG tablet Take 2 tablets (1,000 mg total) by mouth 2 (two) times daily with a meal. 360 tablet 1  . omeprazole (PRILOSEC) 20 MG capsule Take 1 capsule (20 mg total) by mouth daily. 30 capsule 6  . ondansetron (ZOFRAN) 4 MG tablet Take 1 tablet (4 mg total) by mouth every 6 (six) hours. 12 tablet 0  . polyethylene glycol (MIRALAX) packet Take one capful in large glass several times a day until you go to the bathroom, then once a day 14 each 4   No current facility-administered medications for this visit.    Allergies:   Bee venom, Lisinopril, and Shellfish allergy    Social History:  The patient  reports that she quit smoking about 8 years ago. Her smoking use included cigarettes. She has never used smokeless tobacco. She reports that  she does not drink alcohol or use drugs.   Family History:  The patient's family history includes Arthritis in an other family member; Asthma in an other family member; Breast cancer in an other family member; Cancer in her mother; Congenital heart disease in an other family member; Depression in an other family member; Diabetes in her father and mother; Heart attack in an other family member; Heart attack (age of onset: 21) in her mother; Heart failure in an other family member; Hyperlipidemia in her father; Hypertension in her father and mother.    ROS:  Please see the history of present illness.   Otherwise, review of systems are positive for none.   All other systems are reviewed and negative.    PHYSICAL EXAM: VS:  BP 138/76   Pulse 78   Ht 5\' 3"  (1.6 m)   Wt (!) 322 lb (146.1 kg)   BMI 57.04 kg/m  , BMI Body mass index is 57.04 kg/m. GENERAL:  Well appearing HEENT:  Pupils equal round and reactive, fundi not visualized, oral mucosa unremarkable NECK:  No jugular venous distention, waveform within normal limits, carotid upstroke brisk and symmetric, no bruits, no thyromegaly LYMPHATICS:  No cervical, inguinal adenopathy LUNGS:  Clear to auscultation bilaterally BACK:  No CVA tenderness CHEST:  Unremarkable HEART:  PMI not displaced or sustained,S1 and S2 within normal limits, no S3, no S4, no clicks, no rubs, no murmurs ABD:  Flat, positive bowel sounds normal in frequency in pitch, no bruits, no rebound, no guarding, no midline pulsatile mass, no hepatomegaly, no splenomegaly EXT:  2 plus pulses throughout, no edema, no cyanosis no clubbing SKIN:  No rashes no nodules NEURO:  Cranial nerves II through XII grossly intact, motor grossly intact throughout PSYCH:  Cognitively intact, oriented to person place and time    EKG:  EKG is ordered today. The ekg ordered today demonstrates sinus rhythm, rate 78, left axis deviation, intervals within normal limits, no acute ST-T wave  changes.   Recent Labs: 04/22/2019: ALT 14; BUN 10; Creatinine, Ser 0.62; Potassium 4.2; Sodium 141; TSH 1.950    Lipid Panel    Component Value Date/Time   CHOL 143 07/23/2016 1508   TRIG 113 07/23/2016 1508   HDL 28 (L) 07/23/2016 1508   CHOLHDL 5.1 (H) 07/23/2016 1508   LDLCALC 92 07/23/2016 1508      Wt Readings from Last 3 Encounters:  05/04/19 (!) 322 lb (146.1 kg)  03/23/19 (!) 328 lb (148.8 kg)  03/17/18 (!) 310 lb (140.6 kg)      Other studies Reviewed: Additional studies/ records that  were reviewed today include: None. Review of the above records demonstrates:  Please see elsewhere in the note.     ASSESSMENT AND PLAN:  CHEST PAIN: Chest pain is atypical.  However, she does have a significant family history. I will bring the patient back for a POET (Plain Old Exercise Test). This will allow me to screen for obstructive coronary disease, risk stratify and very importantly provide a prescription for exercise.  DM:  A1c is 6.3.  No change in therapy.  RISK REDUCTION: I will be n.p.o. at times.  Lipid profile .  DOE: He has some mild dyspnea which is likely related to weight and deconditioning.     Current medicines are reviewed at length with the patient today.  The patient does not have concerns regarding medicines.  The following changes have been made:  no change  Labs/ tests ordered today include:   Orders Placed This Encounter  Procedures  . Brain natriuretic peptide  . Lipid panel  . Exercise Tolerance Test  . EKG 12-Lead     Disposition:   FU with me as needed.     Signed, Minus Breeding, MD  05/04/2019 5:37 PM    [place]

## 2019-05-04 ENCOUNTER — Encounter: Payer: Self-pay | Admitting: Cardiology

## 2019-05-04 ENCOUNTER — Ambulatory Visit: Payer: Medicaid Other | Admitting: Cardiology

## 2019-05-04 ENCOUNTER — Other Ambulatory Visit: Payer: Self-pay

## 2019-05-04 VITALS — BP 138/76 | HR 78 | Ht 63.0 in | Wt 322.0 lb

## 2019-05-04 DIAGNOSIS — Z7189 Other specified counseling: Secondary | ICD-10-CM | POA: Diagnosis not present

## 2019-05-04 DIAGNOSIS — R0602 Shortness of breath: Secondary | ICD-10-CM | POA: Diagnosis not present

## 2019-05-04 DIAGNOSIS — E7800 Pure hypercholesterolemia, unspecified: Secondary | ICD-10-CM | POA: Diagnosis not present

## 2019-05-04 DIAGNOSIS — R072 Precordial pain: Secondary | ICD-10-CM | POA: Diagnosis not present

## 2019-05-04 NOTE — Patient Instructions (Signed)
Medication Instructions:  No changes *If you need a refill on your cardiac medications before your next appointment, please call your pharmacy*  Lab Work: Your physician recommends that you return for lab work when you come for your exercise tolerance test If you have labs (blood work) drawn today and your tests are completely normal, you will receive your results only by: Marland Kitchen MyChart Message (if you have MyChart) OR . A paper copy in the mail If you have any lab test that is abnormal or we need to change your treatment, we will call you to review the results.  Testing/Procedures: Your physician has requested that you have an exercise tolerance test. For further information please visit [email or web address] Please also follow instruction sheet, as given.   Follow-Up: At [facility], you and your health needs are our priority.  As part of our continuing mission to provide you with exceptional heart care, we have created designated Provider Care Teams.  These Care Teams include your primary Cardiologist (physician) and Advanced Practice Providers (APPs -  Physician Assistants and Nurse Practitioners) who all work together to provide you with the care you need, when you need it.  Other Instructions Follow up as needed

## 2019-05-10 ENCOUNTER — Other Ambulatory Visit (HOSPITAL_COMMUNITY)
Admission: RE | Admit: 2019-05-10 | Discharge: 2019-05-10 | Disposition: A | Discharge status: Home / Self Care | Payer: Medicaid Other | Source: Ambulatory Visit | Attending: Cardiovascular Disease | Admitting: Cardiovascular Disease

## 2019-05-10 DIAGNOSIS — Z01812 Encounter for preprocedural laboratory examination: Secondary | ICD-10-CM | POA: Diagnosis not present

## 2019-05-10 DIAGNOSIS — Z20822 Contact with and (suspected) exposure to covid-19: Secondary | ICD-10-CM | POA: Diagnosis not present

## 2019-05-11 ENCOUNTER — Telehealth (HOSPITAL_COMMUNITY): Payer: Self-pay

## 2019-05-11 LAB — SARS CORONAVIRUS 2 (TAT 6-24 HRS): SARS Coronavirus 2: NEGATIVE

## 2019-05-11 NOTE — Telephone Encounter (Signed)
Encounter complete. 

## 2019-05-13 ENCOUNTER — Ambulatory Visit (HOSPITAL_COMMUNITY)
Admission: RE | Admit: 2019-05-13 | Discharge: 2019-05-13 | Disposition: A | Discharge status: Home / Self Care | Payer: Medicaid Other | Source: Ambulatory Visit | Attending: Cardiovascular Disease | Admitting: Cardiovascular Disease

## 2019-05-13 ENCOUNTER — Other Ambulatory Visit: Payer: Self-pay

## 2019-05-13 DIAGNOSIS — R072 Precordial pain: Secondary | ICD-10-CM | POA: Diagnosis not present

## 2019-05-13 DIAGNOSIS — R0602 Shortness of breath: Secondary | ICD-10-CM | POA: Diagnosis not present

## 2019-05-13 LAB — EXERCISE TOLERANCE TEST
Estimated workload: 3.5 METS
Exercise duration (min): 1 min
Exercise duration (sec): 25 s
MPHR: 182 {beats}/min
Peak HR: 151 {beats}/min
Percent HR: 82 %
Rest HR: 78 {beats}/min

## 2019-05-13 MED FILL — metFORMIN HCL 500 MG TABS: 500 | 90 days supply | Qty: 360 | Fill #0

## 2019-05-20 ENCOUNTER — Encounter: Payer: Self-pay | Admitting: Family

## 2019-05-20 ENCOUNTER — Other Ambulatory Visit: Payer: Self-pay

## 2019-05-20 ENCOUNTER — Ambulatory Visit: Payer: Medicaid Other | Attending: Family | Admitting: Family

## 2019-05-20 VITALS — BP 111/71 | HR 74 | Temp 97.1°F | Resp 16 | Wt 323.8 lb

## 2019-05-20 DIAGNOSIS — H66001 Acute suppurative otitis media without spontaneous rupture of ear drum, right ear: Secondary | ICD-10-CM | POA: Diagnosis not present

## 2019-05-20 DIAGNOSIS — Z79899 Other long term (current) drug therapy: Secondary | ICD-10-CM | POA: Insufficient documentation

## 2019-05-20 DIAGNOSIS — Z7984 Long term (current) use of oral hypoglycemic drugs: Secondary | ICD-10-CM | POA: Diagnosis not present

## 2019-05-20 DIAGNOSIS — Z6841 Body Mass Index (BMI) 40.0 and over, adult: Secondary | ICD-10-CM | POA: Insufficient documentation

## 2019-05-20 DIAGNOSIS — I10 Essential (primary) hypertension: Secondary | ICD-10-CM | POA: Diagnosis not present

## 2019-05-20 DIAGNOSIS — R05 Cough: Secondary | ICD-10-CM | POA: Diagnosis not present

## 2019-05-20 DIAGNOSIS — Z87891 Personal history of nicotine dependence: Secondary | ICD-10-CM | POA: Diagnosis not present

## 2019-05-20 DIAGNOSIS — M1711 Unilateral primary osteoarthritis, right knee: Secondary | ICD-10-CM | POA: Diagnosis not present

## 2019-05-20 DIAGNOSIS — K219 Gastro-esophageal reflux disease without esophagitis: Secondary | ICD-10-CM | POA: Insufficient documentation

## 2019-05-20 MED ORDER — AMOXICILLIN-POT CLAVULANATE 875-125 MG PO TABS: 1.0000 | ORAL_TABLET | Freq: Two times a day (BID) | ORAL | 0 refills | Status: DC

## 2019-05-20 MED FILL — HYDROCHLOROTHIAZIDE 25 MG T: 25 | 30 days supply | Qty: 30 | Fill #4

## 2019-05-20 MED FILL — AMOX-CLAV 875-125 MG TABLET: 875-125 | 10 days supply | Qty: 20 | Fill #0

## 2019-05-20 MED FILL — LOSARTAN POTASSIUM 50 MG TA: 50 | 30 days supply | Qty: 30 | Fill #1

## 2019-05-20 NOTE — Progress Notes (Signed)
Patient ID: Crystal Burns, female    DOB: Feb 12, 1981  MRN: [id number]  CC: Right ear pain   Subjective: Crystal Burns is a 39 y.o. female with history of essential hypertension, asthma, GERD, costochondritis, anxiety, and precordial chest pain who presents for right ear pain follow-up.  1. RIGHT EAR PAIN FOLLOW-UP: Location:  Right ear Description: Denies pain Onset:  3.5 weeks  Modifying factors:  Shower steam makes it worse. Uses QTip for cleaning sometimes. Nothing has made better. Comments: Can feel pressure in right ear, feels like it wants to pop but it hasn't. Has cough and phlegm (white) occasionally. Has asthma and wheezes as a result sometimes. Covid negative recently. Popping sound once in a while. Can hear well in both ears, but reports the volume from her work headset is louder than needed sometimes which irritates her ear. Reports that she woke up one morning a few weeks ago and her right ear and right side of the neck were hurting. Last seen January 2021 by physician assistant Sharon Seller during that encounter patient was prescribed amoxicillin for 10 days. Patient reports that she took the medication in its entirety.  Symptoms  Sensation of fullness: Admits Ear discharge: Denies URI symptoms: Cough Fever: Denies Tinnitus:  Occasionally Dizziness:  Denies Hearing loss: Denies Toothache: Denies Rashes or lesions: Denies Facial muscle weakness: Denies   Red Flags Recent trauma: Denies PMH prior ear surgery:  Denies Diabetes or Immunosuppresion: Denies   Patient Active Problem List   Diagnosis Date Noted  . Precordial chest pain 05/03/2019  . Educated about COVID-19 virus infection 05/03/2019  . Hoarseness of voice 03/17/2018  . Gastroesophageal reflux disease without esophagitis 03/17/2018  . Supervision of normal pregnancy, antepartum 12/15/2017  . Acute medial meniscus tear of right knee 02/10/2017  . Vitamin D deficiency 07/10/2015  . Anxiety  06/20/2015  . Costochondritis 05/18/2015  . Primary osteoarthritis of right knee 04/22/2014  . Essential hypertension 07/18/2012  . LUMBOSACRAL STRAIN, ACUTE 07/05/2008  . Morbid obesity (HCC) 02/24/2008  . Asthma with exacerbation 02/24/2008     Current Outpatient Medications on File Prior to Visit  Medication Sig Dispense Refill  . albuterol (PROVENTIL HFA;VENTOLIN HFA) 108 (90 Base) MCG/ACT inhaler Inhale 2 puffs into the lungs every 4 (four) hours as needed for wheezing or shortness of breath (cough, shortness of breath or wheezing.). 1 Inhaler 3  . albuterol (PROVENTIL) (2.5 MG/3ML) 0.083% nebulizer solution Take 3 mLs (2.5 mg total) by nebulization every 6 (six) hours as needed for wheezing or shortness of breath. 75 mL 12  . amoxicillin (AMOXIL) 500 MG capsule Take 1 capsule (500 mg total) by mouth 3 (three) times daily. 30 capsule 0  . cetirizine-pseudoephedrine (ZYRTEC-D) 5-120 MG tablet Take 1 tablet by mouth daily. 30 tablet 0  . famotidine (PEPCID) 20 MG tablet Take 1 tablet (20 mg total) by mouth at bedtime. 30 tablet 6  . hydrochlorothiazide (HYDRODIURIL) 25 MG tablet Take 1 tablet (25 mg total) by mouth daily. 30 tablet 6  . lidocaine (XYLOCAINE) 2 % solution Use as directed 15 mLs in the mouth or throat as needed for mouth pain. 100 mL 0  . loperamide (IMODIUM) 2 MG capsule Take 1 capsule (2 mg total) by mouth 4 (four) times daily as needed for diarrhea or loose stools. 12 capsule 0  . losartan (COZAAR) 50 MG tablet Take 1 tablet (50 mg total) by mouth daily. 30 tablet 2  . metFORMIN (GLUCOPHAGE) 500 MG tablet Take 2 tablets (  1,000 mg total) by mouth 2 (two) times daily with a meal. 360 tablet 1  . omeprazole (PRILOSEC) 20 MG capsule Take 1 capsule (20 mg total) by mouth daily. 30 capsule 6  . ondansetron (ZOFRAN) 4 MG tablet Take 1 tablet (4 mg total) by mouth every 6 (six) hours. 12 tablet 0  . polyethylene glycol (MIRALAX) packet Take one capful in large glass several times  a day until you go to the bathroom, then once a day 14 each 4   No current facility-administered medications on file prior to visit.    Allergies  Allergen Reactions  . Bee Venom Hives and Swelling  . Lisinopril Shortness Of Breath and Cough  . Shellfish Allergy Anaphylaxis    Social History   Socioeconomic History  . Marital status: Single    Spouse name: Not on file  . Number of children: Not on file  . Years of education: Not on file  . Highest education level: Not on file  Occupational History  . Not on file  Tobacco Use  . Smoking status: Former Smoker    Types: Cigarettes    Quit date: 10/31/2010    Years since quitting: 8.5  . Smokeless tobacco: Never Used  Substance and Sexual Activity  . Alcohol use: No    Alcohol/week: 0.0 standard drinks  . Drug use: No  . Sexual activity: Not on file  Other Topics Concern  . 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     Social Determinants of Health   Financial Resource Strain:   . Difficulty of Paying Living Expenses: Not on file  Food Insecurity:   . Worried About Charity fundraiser in the Last Year: Not on file  . Ran Out of Food in the Last Year: Not on file  Transportation Needs:   . Lack of Transportation (Medical): Not on file  . Lack of Transportation (Non-Medical): Not on file  Physical Activity:   . Days of Exercise per Week: Not on file  . Minutes of Exercise per Session: Not on file  Stress:   . Feeling of Stress : Not on file  Social Connections:   . Frequency of Communication with Friends and Family: Not on file  . Frequency of Social Gatherings with Friends and Family: Not on file  . Attends Religious Services: Not on file  . Active Member of Clubs or Organizations: Not on file  . Attends Archivist Meetings: Not on file  . Marital Status: Not on file  Intimate Partner Violence:   . Fear of Current or Ex-Partner: Not on file  .  Emotionally Abused: Not on file  . Physically Abused: Not on file  . Sexually Abused: Not on file    Family History  Problem Relation Age of Onset  . Hypertension Mother   . Diabetes Mother   . Cancer Mother        Breast   . Heart attack Mother 53  . Hypertension Father   . Diabetes Father   . Hyperlipidemia Father   . Arthritis Other   . Asthma Other   . Breast cancer Other   . Heart failure Other   . Congenital heart disease Other   . Depression Other   . Heart attack Other     Past Surgical History:  Procedure Laterality Date  . CHOLECYSTECTOMY    . DILATION AND CURETTAGE OF UTERUS    .  WISDOM TOOTH EXTRACTION      ROS: Review of Systems  Constitutional: Negative for chills and fever.  HENT: Positive for ear pain and tinnitus. Negative for congestion, ear discharge, sinus pain and sore throat.        Right ear pain with tinnitus occasionally.   Eyes: Negative for blurred vision, double vision and photophobia.  Respiratory: Positive for cough and wheezing.   Cardiovascular: Negative for chest pain, palpitations and leg swelling.  Gastrointestinal: Negative for nausea and vomiting.  Neurological: Negative for dizziness and headaches.  Negative except as stated above  PHYSICAL EXAM: Vitals with BMI 05/20/2019 05/04/2019 03/23/2019  Height - 5\' 3"  -  Weight 323 lbs 13 oz 322 lbs 328 lbs  BMI 57.37 57.05 -  Systolic 111 138  Diastolic 71 76 85  Pulse 74 78 83  Temperature: 97.1 F SpO2: 98%  Physical Exam General appearance - alert, well appearing, and in no distress Mental status - alert, oriented to person, place, and time, normal mood, behavior, speech, dress, motor activity, and thought processes Ears - bilateral TM's and external ear canals normal, no effusion or bulging present, no erythema, no drainage Neck - supple, no significant adenopathy Chest - clear to auscultation, no wheezes, rales or rhonchi, symmetric air entry, no tachypnea, retractions or  cyanosis Heart - normal rate, regular rhythm, normal S1, S2, no murmurs, rubs, clicks or gallops  CMP Latest Ref Rng & Units 04/22/2019 11/20/2018 07/23/2016  Glucose 65 - 99 mg/dL 92 07/25/2016) 81  BUN 6 - 20 mg/dL 10 15 12   Creatinine 0.57 - 1.00 mg/dL 732(K 0.25)  Sodium 134 - 144 mmol/L 141 138 142  Potassium 3.5 - 5.2 mmol/L 4.2 4.0 4.0  Chloride 96 - 106 mmol/L 104 100 104  CO2 20 - 29 mmol/L 27 25 26   Calcium 8.7 - 10.2 mg/dL 9.9 9.9 9.4  Total Protein 6.0 - 8.5 g/dL 6.5 - 6.1  Total Bilirubin 0.0 - 1.2 mg/dL 0.3 - 0.3  Alkaline Phos 39 - 117 IU/L 85 - 72  AST 0 - 40 IU/L 13 - 36  ALT 0 - 32 IU/L 14 - 70(H)   Lipid Panel     Component Value Date/Time   CHOL 143 07/23/2016 1508   TRIG 113 07/23/2016 1508   HDL 28 (L) 07/23/2016 1508   CHOLHDL 5.1 (H) 07/23/2016 1508   LDLCALC 92 07/23/2016 1508    CBC    Component Value Date/Time   WBC 7.9 11/11/2017 1616   RBC 4.37 11/11/2017 1616   HGB 10.8 (L) 11/11/2017 1616   HCT 36.1 11/11/2017 1616   PLT 308 11/11/2017 1616   MCV 82.6 11/11/2017 1616   MCH 24.7 (L) 11/11/2017 1616   MCHC 29.9 (L) 11/11/2017 1616   RDW 14.7 11/11/2017 1616   LYMPHSABS 2.0 11/11/2017 1616   MONOABS 0.4 11/11/2017 1616   EOSABS 0.4 11/11/2017 1616   BASOSABS 0.0 11/11/2017 1616    ASSESSMENT AND PLAN: 1. Acute suppurative otitis media of right ear without spontaneous rupture of tympanic membrane, recurrence not specified: - amoxicillin-clavulanate (AUGMENTIN) 875-125 MG tablet; Take 1 tablet by mouth 2 (two) times daily.  Dispense: 20 tablet; Refill: 0 -Counseled patient on the importance of not sticking items such as qtips in the ear in order to prevent damage or further infection and lowering volume of headset at work to decrease irritation of ear canal -Covid-19 test negative 05/10/2019 -Patient should make follow-up appointment with attending physician if symptoms do  not resolve or worsen -Patient was given clear instructions to go to  Emergency Department or return to medical center if symptoms don't improve, worsen, or new problems develop.The patient verbalized understanding.  Patient was given the opportunity to ask questions.  Patient verbalized understanding of the plan and was able to repeat key elements of the plan.      Requested Prescriptions    No prescriptions requested or ordered in this encounter    Follow-up with attending physician as needed.  Rema Fendt, NP

## 2019-05-20 NOTE — Patient Instructions (Addendum)
Take Amoxicillin-Clavulanate for ear infection. Follow-up with attending physician if symptoms do not improve.  Earache, Adult An earache, or ear pain, can be caused by many things, including:  An infection.  Ear wax buildup.  Ear pressure.  Something in the ear that should not be there (foreign body).  A sore throat.  Tooth problems.  Jaw problems. Treatment of the earache will depend on the cause. If the cause is not clear or cannot be determined, you may need to watch your symptoms until your earache goes away or until a cause is found. Follow these instructions at home: Medicines  Take or apply over-the-counter and prescription medicines only as told by your health care provider.  If you were prescribed an antibiotic medicine, use it as told by your health care provider. Do not stop using the antibiotic even if you start to feel better.  Do not put anything in your ear other than medicine that is prescribed by your health care provider. Managing pain If directed, apply heat to the affected area as often as told by your health care provider. Use the heat source that your health care provider recommends, such as a moist heat pack or a heating pad.  Place a towel between your skin and the heat source.  Leave the heat on for 20-30 minutes.  Remove the heat if your skin turns bright red. This is especially important if you are unable to feel pain, heat, or cold. You may have a greater risk of getting burned. If directed, put ice on the affected area as often as told by your health care provider. To do this:      Put ice in a plastic bag.  Place a towel between your skin and the bag.  Leave the ice on for 20 minutes, 2-3 times a day. General instructions  Pay attention to any changes in your symptoms.  Try resting in an upright position instead of lying down. This may help to reduce pressure in your ear and relieve pain.  Chew gum if it helps to relieve your ear  pain.  Treat any allergies as told by your health care provider.  Drink enough fluid to keep your urine pale yellow.  It is up to you to get the results of any tests that were done. Ask your health care provider, or the department that is doing the tests, when your results will be ready.  Keep all follow-up visits as told by your health care provider. This is important. Contact a health care provider if:  Your pain does not improve within 2 days.  Your earache gets worse.  You have new symptoms.  You have a fever. Get help right away if you:  Have a severe headache.  Have a stiff neck.  Have trouble swallowing.  Have redness or swelling behind your ear.  Have fluid or blood coming from your ear.  Have hearing loss.  Feel dizzy. Summary  An earache, or ear pain, can be caused by many things.  Treatment of the earache will depend on the cause. Follow recommendations from your health care provider to treat your ear pain.  If the cause is not clear or cannot be determined, you may need to watch your symptoms until your earache goes away or until a cause is found.  Keep all follow-up visits as told by your health care provider. This is important. This information is not intended to replace advice given to you by your health care provider. Make  sure you discuss any questions you have with your health care provider. Document Revised: 10/31/2018 Document Reviewed: 10/31/2018 Elsevier Patient Education  2020 [employer].

## 2019-06-03 ENCOUNTER — Other Ambulatory Visit: Payer: Self-pay

## 2019-06-03 ENCOUNTER — Ambulatory Visit: Payer: Medicaid Other | Attending: Family Medicine | Admitting: Family Medicine

## 2019-06-03 DIAGNOSIS — H6981 Other specified disorders of Eustachian tube, right ear: Secondary | ICD-10-CM | POA: Diagnosis not present

## 2019-06-03 MED ORDER — FLUTICASONE PROPIONATE 50 MCG/ACT NA SUSP: 2.0000 | Freq: Every day | NASAL | 1 refills | Status: DC

## 2019-06-03 MED ORDER — PREDNISONE 20 MG PO TABS: 20.0000 mg | ORAL_TABLET | Freq: Every day | ORAL | 0 refills | Status: DC

## 2019-06-03 MED ORDER — CETIRIZINE HCL 10 MG PO TABS: 10.0000 mg | ORAL_TABLET | Freq: Every day | ORAL | 1 refills | Status: DC

## 2019-06-03 MED FILL — FLUTICASONE PROP 50 MCG SPR: 50 | 30 days supply | Qty: 16 | Fill #0

## 2019-06-03 MED FILL — predniSONE 20 MG TABS: 20 | 5 days supply | Qty: 5 | Fill #0

## 2019-06-03 MED FILL — CETIRIZINE HCL 10 MG TABS: 10 | 30 days supply | Qty: 30 | Fill #0

## 2019-06-03 NOTE — Progress Notes (Signed)
Patient has been called and DOB has been verified. Patient has been screened and transferred to PCP to start phone visit.   Patient states that she is still having ear pain pain. Finished the antibiotic. States that ear still pops.

## 2019-06-03 NOTE — Progress Notes (Signed)
Virtual Visit via Telephone Note  I connected with Crystal Burns, on 06/03/2019 at 9:57 AM by telephone due to the COVID-19 pandemic and verified that I am speaking with the correct person using two identifiers.   Consent: I discussed the limitations, risks, security and privacy concerns of performing an evaluation and management service by telephone and the availability of in person appointments. I also discussed with the patient that there may be a patient responsible charge related to this service. The patient expressed understanding and agreed to proceed.   Location of Patient: Home  Location of Provider: Clinic   Persons participating in Telemedicine visit: Fenix T Serita Kyle Farrington-CMA Dr. Alvis Lemmings     History of Present Illness: Crystal Burns is a 39 year old female with a history of hypertension, chronic sinusitis, morbid obesity who presents today with complaints of ear pain.   She lost her job 2 weeks ago; she had been working from home prior to that.   Her ear pops with associated pressure in it and when she blows her nose her R ear does not unpop. She has no pain but feels like she has drainage in her R ear; loud music makes her ear uncomfortable. She denies presence of tinnitus, vertigo, fever, URI symptoms, she sometimes experiences dizziness. She was treated for R ear otitis with Augmentin on 05/20/19; on 04/22/19 she was treated with Amoxicillin for same.   Past Medical History:  Diagnosis Date  . Asthma    2010-only bothers pt during bad cold  . Depression    2010-had when mother passed away  . History of gonorrhea   . Hypertension    2004  . Obesity   . Pre-diabetes   . Preeclampsia complicating hypertension 01/21/2015   Allergies  Allergen Reactions  . Bee Venom Hives and Swelling  . Lisinopril Shortness Of Breath and Cough  . Shellfish Allergy Anaphylaxis    Current Outpatient Medications on File Prior to Visit  Medication Sig  Dispense Refill  . famotidine (PEPCID) 20 MG tablet Take 1 tablet (20 mg total) by mouth at bedtime. 30 tablet 6  . hydrochlorothiazide (HYDRODIURIL) 25 MG tablet Take 1 tablet (25 mg total) by mouth daily. 30 tablet 6  . loperamide (IMODIUM) 2 MG capsule Take 1 capsule (2 mg total) by mouth 4 (four) times daily as needed for diarrhea or loose stools. 12 capsule 0  . losartan (COZAAR) 50 MG tablet Take 1 tablet (50 mg total) by mouth daily. 30 tablet 2  . metFORMIN (GLUCOPHAGE) 500 MG tablet Take 2 tablets (1,000 mg total) by mouth 2 (two) times daily with a meal. 360 tablet 1  . omeprazole (PRILOSEC) 20 MG capsule Take 1 capsule (20 mg total) by mouth daily. 30 capsule 6  . ondansetron (ZOFRAN) 4 MG tablet Take 1 tablet (4 mg total) by mouth every 6 (six) hours. 12 tablet 0  . polyethylene glycol (MIRALAX) packet Take one capful in large glass several times a day until you go to the bathroom, then once a day 14 each 4  . albuterol (PROVENTIL HFA;VENTOLIN HFA) 108 (90 Base) MCG/ACT inhaler Inhale 2 puffs into the lungs every 4 (four) hours as needed for wheezing or shortness of breath (cough, shortness of breath or wheezing.). 1 Inhaler 3  . albuterol (PROVENTIL) (2.5 MG/3ML) 0.083% nebulizer solution Take 3 mLs (2.5 mg total) by nebulization every 6 (six) hours as needed for wheezing or shortness of breath. 75 mL 12  . amoxicillin (AMOXIL) 500 MG  capsule Take 1 capsule (500 mg total) by mouth 3 (three) times daily. (Patient not taking: Reported on 05/20/2019) 30 capsule 0  . amoxicillin-clavulanate (AUGMENTIN) 875-125 MG tablet Take 1 tablet by mouth 2 (two) times daily. (Patient not taking: Reported on 06/03/2019) 20 tablet 0  . cetirizine-pseudoephedrine (ZYRTEC-D) 5-120 MG tablet Take 1 tablet by mouth daily. (Patient not taking: Reported on 06/03/2019) 30 tablet 0  . lidocaine (XYLOCAINE) 2 % solution Use as directed 15 mLs in the mouth or throat as needed for mouth pain. (Patient not taking:  Reported on 06/03/2019) 100 mL 0   No current facility-administered medications on file prior to visit.    Observations/Objective: Alert, awake, oriented x3 Not in acute distress  Assessment and Plan: 1. Dysfunction of right eustachian tube Treated with amoxicillin followed by Augmentin over the course of the last 1 month She does have underlying chronic sinusitis but has not been on Flonase or Zyrtec which I will resume at this time Short course of prednisone for dysfunction of eustachian tube and if symptoms persist consider ENT referral - fluticasone (FLONASE) 50 MCG/ACT nasal spray; Place 2 sprays into both nostrils daily.  Dispense: 16 g; Refill: 1 - predniSONE (DELTASONE) 20 MG tablet; Take 1 tablet (20 mg total) by mouth daily with breakfast.  Dispense: 5 tablet; Refill: 0 - cetirizine (ZYRTEC) 10 MG tablet; Take 1 tablet (10 mg total) by mouth daily.  Dispense: 30 tablet; Refill: 1   Follow Up Instructions: Return for medical conditions, keep previously scheduled appointment.    I discussed the assessment and treatment plan with the patient. The patient was provided an opportunity to ask questions and all were answered. The patient agreed with the plan and demonstrated an understanding of the instructions.   The patient was advised to call back or seek an in-person evaluation if the symptoms worsen or if the condition fails to improve as anticipated.     I provided 15 minutes total of non-face-to-face time during this encounter including median intraservice time, reviewing previous notes, investigations, ordering medications, medical decision making, coordinating care and patient verbalized understanding at the end of the visit.     Charlott Rakes, MD, FAAFP. [facility] [place], [place]   06/03/2019, 9:57 AM

## 2019-06-04 ENCOUNTER — Encounter: Payer: Self-pay | Admitting: Family Medicine

## 2019-06-24 MED FILL — HYDROCHLOROTHIAZIDE 25 MG T: 25 | 30 days supply | Qty: 30 | Fill #5

## 2019-06-24 MED FILL — LOSARTAN POTASSIUM 50 MG TA: 50 | 30 days supply | Qty: 30 | Fill #2

## 2019-06-25 ENCOUNTER — Ambulatory Visit (HOSPITAL_COMMUNITY)
Admission: EM | Admit: 2019-06-25 | Discharge: 2019-06-25 | Disposition: A | Discharge status: Home / Self Care | Payer: Medicaid Other | Attending: Emergency Medicine | Admitting: Emergency Medicine

## 2019-06-25 ENCOUNTER — Encounter (HOSPITAL_COMMUNITY): Payer: Self-pay

## 2019-06-25 ENCOUNTER — Other Ambulatory Visit: Payer: Self-pay

## 2019-06-25 DIAGNOSIS — H6981 Other specified disorders of Eustachian tube, right ear: Secondary | ICD-10-CM | POA: Insufficient documentation

## 2019-06-25 DIAGNOSIS — T7840XA Allergy, unspecified, initial encounter: Secondary | ICD-10-CM | POA: Diagnosis not present

## 2019-06-25 DIAGNOSIS — H9201 Otalgia, right ear: Secondary | ICD-10-CM | POA: Insufficient documentation

## 2019-06-25 LAB — POCT RAPID STREP A: Streptococcus, Group A Screen (Direct): NEGATIVE

## 2019-06-25 MED ORDER — IBUPROFEN 600 MG PO TABS: 600.0000 mg | ORAL_TABLET | Freq: Four times a day (QID) | ORAL | 0 refills | Status: DC | PRN

## 2019-06-25 MED ORDER — AZELASTINE HCL 0.1 % NA SOLN: 2.0000 | Freq: Two times a day (BID) | NASAL | 0 refills | Status: DC

## 2019-06-25 NOTE — ED Provider Notes (Signed)
HPI  SUBJECTIVE:  Crystal Burns is a 39 y.o. female who presents with right ear pressure that she cannot  "unpop".  This has been going on since January 14.  She states that she started having ear pain yesterday.  Ear pain is sore intermittent lasting hours.  States that the pain radiates down her right throat.  She reports pain with swallowing.  She reports nasal congestion and states that she has been blowing her nose frequently.  She reports clear rhinorrhea, itchy watery eyes.  No sneezing..  Is she does not grind her teeth at night.  No recent swimming.  Is not associated with chewing or yawning.  She denies foreign body insertion.  She has been alternating Tylenol and ibuprofen with improvement in her symptoms. No aggravating factors She has also been applying heat.  No antipyretic in the past 4 to 6 hours.  She had a video visit on 1/14 with right ear pressure was started on amoxicillin.  She was started on Augmentin 1/22 when she had ear pain and was found to have a right-sided otitis media.  She had a video visit with her primary care physician on 2/25 with ear popping, dizziness.  She was prescribed Flonase prednisone 20 mg for 5 days Zyrtec.  States none of this is helped.  She has a past medical history of diabetes asthma GERD chronic sinusitis hypertension allergies.  LMP: Depo.  Denies possibility being pregnant.  [id number], Odette Horns, MD     Past Medical History:  Diagnosis Date  . Asthma    2010-only bothers pt during bad cold  . Depression    2010-had when mother passed away  . History of gonorrhea   . Hypertension    2004  . Obesity   . Pre-diabetes   . Preeclampsia complicating hypertension 01/21/2015    Past Surgical History:  Procedure Laterality Date  . CHOLECYSTECTOMY    . DILATION AND CURETTAGE OF UTERUS    . WISDOM TOOTH EXTRACTION      Family History  Problem Relation Age of Onset  . Hypertension Mother   . Diabetes Mother   . Cancer Mother    Breast   . Heart attack Mother 17  . Hypertension Father   . Diabetes Father   . Hyperlipidemia Father   . Arthritis Other   . Asthma Other   . Breast cancer Other   . Heart failure Other   . Congenital heart disease Other   . Depression Other   . Heart attack Other     Social History   Tobacco Use  . Smoking status: Former Smoker    Types: Cigarettes    Quit date: 10/31/2010    Years since quitting: 8.6  . Smokeless tobacco: Never Used  Substance Use Topics  . Alcohol use: No    Alcohol/week: 0.0 standard drinks  . Drug use: No    No current facility-administered medications for this encounter.  Current Outpatient Medications:  .  famotidine (PEPCID) 20 MG tablet, Take 1 tablet (20 mg total) by mouth at bedtime., Disp: 30 tablet, Rfl: 6 .  hydrochlorothiazide (HYDRODIURIL) 25 MG tablet, Take 1 tablet (25 mg total) by mouth daily., Disp: 30 tablet, Rfl: 6 .  losartan (COZAAR) 50 MG tablet, Take 1 tablet (50 mg total) by mouth daily., Disp: 30 tablet, Rfl: 2 .  metFORMIN (GLUCOPHAGE) 500 MG tablet, Take 2 tablets (1,000 mg total) by mouth 2 (two) times daily with a meal., Disp: 360 tablet, Rfl: 1 .  albuterol (PROVENTIL HFA;VENTOLIN HFA) 108 (90 Base) MCG/ACT inhaler, Inhale 2 puffs into the lungs every 4 (four) hours as needed for wheezing or shortness of breath (cough, shortness of breath or wheezing.)., Disp: 1 Inhaler, Rfl: 3 .  albuterol (PROVENTIL) (2.5 MG/3ML) 0.083% nebulizer solution, Take 3 mLs (2.5 mg total) by nebulization every 6 (six) hours as needed for wheezing or shortness of breath., Disp: 75 mL, Rfl: 12 .  azelastine (ASTELIN) 0.1 % nasal spray, Place 2 sprays into both nostrils 2 (two) times daily. Use in each nostril as directed, Disp: 30 mL, Rfl: 0 .  ibuprofen (ADVIL) 600 MG tablet, Take 1 tablet (600 mg total) by mouth every 6 (six) hours as needed., Disp: 30 tablet, Rfl: 0 .  lidocaine (XYLOCAINE) 2 % solution, Use as directed 15 mLs in the mouth or  throat as needed for mouth pain. (Patient not taking: Reported on 06/03/2019), Disp: 100 mL, Rfl: 0 .  loperamide (IMODIUM) 2 MG capsule, Take 1 capsule (2 mg total) by mouth 4 (four) times daily as needed for diarrhea or loose stools., Disp: 12 capsule, Rfl: 0 .  omeprazole (PRILOSEC) 20 MG capsule, Take 1 capsule (20 mg total) by mouth daily., Disp: 30 capsule, Rfl: 6 .  ondansetron (ZOFRAN) 4 MG tablet, Take 1 tablet (4 mg total) by mouth every 6 (six) hours., Disp: 12 tablet, Rfl: 0 .  polyethylene glycol (MIRALAX) packet, Take one capful in large glass several times a day until you go to the bathroom, then once a day, Disp: 14 each, Rfl: 4  Allergies  Allergen Reactions  . Bee Venom Hives and Swelling  . Lisinopril Shortness Of Breath and Cough  . Shellfish Allergy Anaphylaxis     ROS  As noted in HPI.   Physical Exam  BP (!) 152/89   Pulse 82   Temp 98.5 F (36.9 C) (Oral)   Resp 18   SpO2 100%   Constitutional: Well developed, well nourished, no acute distress Eyes:  EOMI, conjunctiva normal bilaterally HENT: Normocephalic, atraumatic,mucus membranes moist.  Positive nasal congestion.  No maxillary or frontal sinus tenderness.  Enlarged tonsils without exudates.  Slightly erythematous oropharynx.  Right external ear normal.  No pain with palpation of mastoid.  No pain with traction on pinna or palpation of tragus.  Right external ear canal normal.  Right TM intact, normal.  Left TM normal. Neck: No cervical lymphadenopathy bilaterally Respiratory: Normal inspiratory effort Cardiovascular: Normal rate GI: nondistended skin: No rash, skin intact Musculoskeletal: no deformities Neurologic: Alert & oriented x 3, no focal neuro deficits Psychiatric: Speech and behavior appropriate   ED Course   Medications - No data to display  Orders Placed This Encounter  Procedures  . Culture, group A strep (throat)    Standing Status:   Standing    Number of Occurrences:   1  .  POCT rapid strep A [facility])    Standing Status:   Standing    Number of Occurrences:   1    No results found for this or any previous visit (from the past 24 hour(s)). No results found.  ED Clinical Impression  1. Eustachian tube dysfunction, right   2. Allergy, initial encounter   3. Right ear pain      ED Assessment/Plan  We will check a rapid strep due to the enlarged tonsils, but I think that this is still eustachian tube dysfunction.  Discontinue current nasal steroid spray and antihistamine as they are not  working. Dc Flonase start Astelin.  Discontinue Zyrtec start Allegra or Claritin. saline nasal irrigation with a Milta Deiters med sinus rinse and distilled water as often as she wants, Afrin on the right side only at night for 3 days maximum.  Ibuprofen 600 mg combined with 1000 mg of Tylenol 3-4 times a day as needed for pain.  Follow-up with [facility] or Dr. Benjamine Mola ASAP.  Rapid strep negative.  Discussed labs, MDM, treatment plan, and plan for follow-up with patient. patient agrees with plan.   Meds ordered this encounter  Medications  . azelastine (ASTELIN) 0.1 % nasal spray    Sig: Place 2 sprays into both nostrils 2 (two) times daily. Use in each nostril as directed    Dispense:  30 mL    Refill:  0  . ibuprofen (ADVIL) 600 MG tablet    Sig: Take 1 tablet (600 mg total) by mouth every 6 (six) hours as needed.    Dispense:  30 tablet    Refill:  0    *This clinic note was created using [employer]. Therefore, there may be occasional mistakes despite careful proofreading.   ?    Melynda Ripple, MD 06/27/19 [phone]

## 2019-06-25 NOTE — ED Triage Notes (Signed)
Pt c/o sore throat, right ear and right neck pain for approx 1 month. Pt states her PMD has Rx two different antibiotics, steroid and allergy medication. Pt reports she felt her ear "pop", but ear feels like it needs to unpop."  Also reports sinus congestion, but states she has ongoing "allergies".  Denies fever, chills. Had negative COVID test approx 1 month ago for pre-procedure.  Declines covid test at this time until she speaks with a medical provider.

## 2019-06-25 NOTE — Discharge Instructions (Addendum)
Your rapid strep was negative.    Discontinue Flonase start Astelin.  Discontinue Zyrtec start Allegra or Claritin.  Saline nasal irrigation with a Lloyd Huger med sinus rinse and distilled water as often as you want.  This will help decrease your allergies and also prevent sinus infections. Afrin on the right side only at night for 3 days maximum.  Ibuprofen 600 mg combined with 1000 mg of Tylenol 3-4 times a day as needed for pain.  Follow-up with [facility] or Dr. Suszanne Conners ASAP.

## 2019-06-28 MED FILL — IBUPROFEN 600 MG TABLET: 600 | 7 days supply | Qty: 30 | Fill #0

## 2019-06-28 MED FILL — AZELASTINE HCL 137 MCG SPRY: 0.1 | 50 days supply | Qty: 30 | Fill #0

## 2019-06-29 LAB — CULTURE, GROUP A STREP (THRC)

## 2019-07-20 DIAGNOSIS — U071 COVID-19: Secondary | ICD-10-CM | POA: Diagnosis not present

## 2019-07-23 MED FILL — LOSARTAN POTASSIUM 50 MG TA: 50 | 30 days supply | Qty: 30 | Fill #0

## 2019-07-23 MED FILL — HYDROCHLOROTHIAZIDE 25 MG T: 25 | 30 days supply | Qty: 30 | Fill #6

## 2019-07-29 DIAGNOSIS — Z7189 Other specified counseling: Secondary | ICD-10-CM | POA: Diagnosis not present

## 2019-07-29 DIAGNOSIS — U071 COVID-19: Secondary | ICD-10-CM | POA: Diagnosis not present

## 2019-07-30 DIAGNOSIS — Z20828 Contact with and (suspected) exposure to other viral communicable diseases: Secondary | ICD-10-CM | POA: Diagnosis not present

## 2019-08-04 ENCOUNTER — Other Ambulatory Visit: Payer: Self-pay

## 2019-08-04 ENCOUNTER — Encounter (HOSPITAL_COMMUNITY): Payer: Self-pay

## 2019-08-04 ENCOUNTER — Ambulatory Visit (HOSPITAL_COMMUNITY)
Admission: EM | Admit: 2019-08-04 | Discharge: 2019-08-04 | Disposition: A | Discharge status: Home / Self Care | Payer: Medicaid Other | Attending: Urgent Care | Admitting: Urgent Care

## 2019-08-04 DIAGNOSIS — R05 Cough: Secondary | ICD-10-CM | POA: Diagnosis not present

## 2019-08-04 DIAGNOSIS — J4540 Moderate persistent asthma, uncomplicated: Secondary | ICD-10-CM

## 2019-08-04 DIAGNOSIS — R0789 Other chest pain: Secondary | ICD-10-CM

## 2019-08-04 DIAGNOSIS — U071 COVID-19: Secondary | ICD-10-CM

## 2019-08-04 DIAGNOSIS — R059 Cough, unspecified: Secondary | ICD-10-CM

## 2019-08-04 MED ORDER — BENZONATATE 100 MG PO CAPS: 100.0000 mg | ORAL_CAPSULE | Freq: Three times a day (TID) | ORAL | 0 refills | Status: DC | PRN

## 2019-08-04 MED ORDER — METHYLPREDNISOLONE SODIUM SUCC 125 MG IJ SOLR
INTRAMUSCULAR | Status: AC
  Filled 2019-08-04: qty 2

## 2019-08-04 MED ORDER — CETIRIZINE HCL 10 MG PO TABS: 10.0000 mg | ORAL_TABLET | Freq: Every day | ORAL | 0 refills | Status: DC

## 2019-08-04 MED ORDER — PROMETHAZINE-DM 6.25-15 MG/5ML PO SYRP: 5.0000 mL | ORAL_SOLUTION | Freq: Every evening | ORAL | 0 refills | Status: DC | PRN

## 2019-08-04 MED ORDER — METHYLPREDNISOLONE SODIUM SUCC 125 MG IJ SOLR
80.0000 mg | Freq: Once | INTRAMUSCULAR | Status: AC
  Administered 2019-08-04: 12:00:00 80 mg via INTRAMUSCULAR

## 2019-08-04 MED ORDER — TIZANIDINE HCL 4 MG PO TABS: 4.0000 mg | ORAL_TABLET | Freq: Three times a day (TID) | ORAL | 0 refills | Status: DC | PRN

## 2019-08-04 MED FILL — tiZANidine HCL 4 MG TABS: 4 | 10 days supply | Qty: 30 | Fill #0

## 2019-08-04 MED FILL — PROMETHAZINE W/DM SYRUP: 6.25-15 | 20 days supply | Qty: 100 | Fill #0

## 2019-08-04 NOTE — ED Triage Notes (Signed)
Pt is here with cough(worst at night), chest/ back pain that are long hauling symptoms from COVID she states. She tested POSITIVE for COVID on 07/20/2019, pt has taken Advil, BC powder to relieve discomfort.

## 2019-08-04 NOTE — ED Provider Notes (Signed)
[place]   MRN: [id number] DOB: [birth year]  Subjective:   Crystal Burns is a 39 y.o. female presenting for recheck on cough. Tested positive for COVID 19 on 07/20/2019. Has had persistent cough that is now eliciting chest pain, is mildly productive. Has tried Alka-seltzer, Mucinex, ginger root tea. Has had intermittent wheezing. Has a hx of asthma, has been using inhaler or nebulizer treatment at least once daily.   No current facility-administered medications for this encounter.  Current Outpatient Medications:  .  albuterol (PROVENTIL HFA;VENTOLIN HFA) 108 (90 Base) MCG/ACT inhaler, Inhale 2 puffs into the lungs every 4 (four) hours as needed for wheezing or shortness of breath (cough, shortness of breath or wheezing.)., Disp: 1 Inhaler, Rfl: 3 .  albuterol (PROVENTIL) (2.5 MG/3ML) 0.083% nebulizer solution, Take 3 mLs (2.5 mg total) by nebulization every 6 (six) hours as needed for wheezing or shortness of breath., Disp: 75 mL, Rfl: 12 .  azelastine (ASTELIN) 0.1 % nasal spray, Place 2 sprays into both nostrils 2 (two) times daily. Use in each nostril as directed, Disp: 30 mL, Rfl: 0 .  famotidine (PEPCID) 20 MG tablet, Take 1 tablet (20 mg total) by mouth at bedtime., Disp: 30 tablet, Rfl: 6 .  hydrochlorothiazide (HYDRODIURIL) 25 MG tablet, Take 1 tablet (25 mg total) by mouth daily., Disp: 30 tablet, Rfl: 6 .  ibuprofen (ADVIL) 600 MG tablet, Take 1 tablet (600 mg total) by mouth every 6 (six) hours as needed., Disp: 30 tablet, Rfl: 0 .  lidocaine (XYLOCAINE) 2 % solution, Use as directed 15 mLs in the mouth or throat as needed for mouth pain. (Patient not taking: Reported on 06/03/2019), Disp: 100 mL, Rfl: 0 .  loperamide (IMODIUM) 2 MG capsule, Take 1 capsule (2 mg total) by mouth 4 (four) times daily as needed for diarrhea or loose stools., Disp: 12 capsule, Rfl: 0 .  losartan (COZAAR) 50 MG tablet, Take 1 tablet (50 mg total) by mouth daily., Disp: 30 tablet, Rfl: 2 .   metFORMIN (GLUCOPHAGE) 500 MG tablet, Take 2 tablets (1,000 mg total) by mouth 2 (two) times daily with a meal., Disp: 360 tablet, Rfl: 1 .  omeprazole (PRILOSEC) 20 MG capsule, Take 1 capsule (20 mg total) by mouth daily., Disp: 30 capsule, Rfl: 6 .  ondansetron (ZOFRAN) 4 MG tablet, Take 1 tablet (4 mg total) by mouth every 6 (six) hours., Disp: 12 tablet, Rfl: 0 .  polyethylene glycol (MIRALAX) packet, Take one capful in large glass several times a day until you go to the bathroom, then once a day, Disp: 14 each, Rfl: 4   Allergies  Allergen Reactions  . Bee Venom Hives and Swelling  . Lisinopril Shortness Of Breath and Cough  . Shellfish Allergy Anaphylaxis    Past Medical History:  Diagnosis Date  . Asthma    2010-only bothers pt during bad cold  . Depression    2010-had when mother passed away  . History of gonorrhea   . Hypertension    2004  . Obesity   . Pre-diabetes   . Preeclampsia complicating hypertension 01/21/2015     Past Surgical History:  Procedure Laterality Date  . CHOLECYSTECTOMY    . DILATION AND CURETTAGE OF UTERUS    . WISDOM TOOTH EXTRACTION      Family History  Problem Relation Age of Onset  . Hypertension Mother   . Diabetes Mother   . Cancer Mother        Breast   .  Heart attack Mother 84  . Hypertension Father   . Diabetes Father   . Hyperlipidemia Father   . Arthritis Other   . Asthma Other   . Breast cancer Other   . Heart failure Other   . Congenital heart disease Other   . Depression Other   . Heart attack Other     Social History   Tobacco Use  . Smoking status: Former Smoker    Types: Cigarettes    Quit date: 10/31/2010    Years since quitting: 8.7  . Smokeless tobacco: Never Used  Substance Use Topics  . Alcohol use: No    Alcohol/week: 0.0 standard drinks  . Drug use: No    Review of Systems  Constitutional: Positive for malaise/fatigue. Negative for fever.  HENT: Negative for congestion, ear pain, sinus pain and  sore throat.   Eyes: Negative for discharge and redness.  Respiratory: Positive for cough. Negative for hemoptysis, shortness of breath and wheezing.   Cardiovascular: Positive for chest pain.  Gastrointestinal: Negative for abdominal pain, diarrhea, nausea and vomiting.  Genitourinary: Negative for dysuria, flank pain and hematuria.  Musculoskeletal: Negative for myalgias.  Skin: Negative for rash.  Neurological: Negative for dizziness, weakness and headaches.  Psychiatric/Behavioral: Negative for depression and substance abuse.     Objective:   Vitals: BP 129/84 (BP Location: Right Arm)   Pulse 79   Temp 98.8 F (37.1 C) (Oral)   Resp (!) 22   SpO2 100%   Physical Exam Constitutional:      General: She is not in acute distress.    Appearance: Normal appearance. She is well-developed. She is not ill-appearing, toxic-appearing or diaphoretic.  HENT:     Head: Normocephalic and atraumatic.     Nose: Nose normal.     Mouth/Throat:     Mouth: Mucous membranes are moist.  Eyes:     Extraocular Movements: Extraocular movements intact.     Pupils: Pupils are equal, round, and reactive to light.  Cardiovascular:     Rate and Rhythm: Normal rate and regular rhythm.     Pulses: Normal pulses.     Heart sounds: Normal heart sounds. No murmur. No friction rub. No gallop.   Pulmonary:     Effort: Pulmonary effort is normal. No respiratory distress.     Breath sounds: No stridor. Wheezing (mild over mid lung fields bilaterally) present. No rhonchi or rales.  Skin:    General: Skin is warm and dry.     Findings: No rash.  Neurological:     Mental Status: She is alert and oriented to person, place, and time.  Psychiatric:        Mood and Affect: Mood normal.        Behavior: Behavior normal.        Thought Content: Thought content normal.        Judgment: Judgment normal.     Assessment and Plan :   PDMP not reviewed this encounter.  1. Cough   2. COVID-19   3. Atypical  chest pain   4. Moderate persistent asthma without complication   5. Morbid obesity (HCC)     Suspect ongoing cough from COVID 19. In the setting of having asthma, will use IM Depomedrol (per patient's request). Use cough suppression medications. Lung exam clear and 100% pulse oximetry, defer imaging. Counseled patient on potential for adverse effects with medications prescribed/recommended today, ER and return-to-clinic precautions discussed, patient verbalized understanding.    Wallis Bamberg, PA-C  08/04/19 1238  

## 2019-08-04 NOTE — Discharge Instructions (Addendum)
Please just use Tylenol at a dose of 500mg-650mg once every 6 hours as needed for your aches, pains, fevers. Do not use any nonsteroidal anti-inflammatories (NSAIDs) like ibuprofen, Motrin, naproxen, Aleve, etc. which are all available over-the-counter.   

## 2019-08-09 ENCOUNTER — Ambulatory Visit: Payer: Medicaid Other | Attending: Family Medicine | Admitting: Family Medicine

## 2019-08-09 ENCOUNTER — Other Ambulatory Visit: Payer: Self-pay

## 2019-08-09 DIAGNOSIS — Z91199 Patient's noncompliance with other medical treatment and regimen due to unspecified reason: Secondary | ICD-10-CM

## 2019-08-09 DIAGNOSIS — Z5329 Procedure and treatment not carried out because of patient's decision for other reasons: Secondary | ICD-10-CM

## 2019-08-09 NOTE — Progress Notes (Signed)
   Virtual Visit via Telephone Note  I attempted to connect with Crystal Burns on 08/09/19 at  9:10 AM EDT by telephone and verified that I am speaking with the correct person using two identifiers.   I discussed the limitations, risks, security and privacy concerns of performing an evaluation and management service by telephone and the availability of in person appointments. I also discussed with the patient that there may be a patient responsible charge related to this service. The patient expressed understanding and agreed to proceed.  Patient Location: Home? Provider Location: [facility] Others participating in call: none  *Call placed to patient to start telehealth visit at 9:29 AM.  Call went to voicemail.  No message left.  Crystal Henriquez MD *Second call placed to patient's number in order to start home visit.  Call went to voicemail.  No message left.  Appointment will be considered and no-show.  Crystal Lortie MD  History of Present Illness:   Past Medical History:  Diagnosis Date  . Asthma    2010-only bothers pt during bad cold  . Depression    2010-had when mother passed away  . History of gonorrhea   . Hypertension    2004  . Obesity   . Pre-diabetes   . Preeclampsia complicating hypertension 01/21/2015    Past Surgical History:  Procedure Laterality Date  . CHOLECYSTECTOMY    . DILATION AND CURETTAGE OF UTERUS    . WISDOM TOOTH EXTRACTION      Family History  Problem Relation Age of Onset  . Hypertension Mother   . Diabetes Mother   . Cancer Mother        Breast   . Heart attack Mother 26  . Hypertension Father   . Diabetes Father   . Hyperlipidemia Father   . Arthritis Other   . Asthma Other   . Breast cancer Other   . Heart failure Other   . Congenital heart disease Other   . Depression Other   . Heart attack Other     Social History   Tobacco Use  . Smoking status: Former Smoker    Types: Cigarettes    Quit date: 10/31/2010    Years since  quitting: 8.7  . Smokeless tobacco: Never Used  Substance Use Topics  . Alcohol use: No    Alcohol/week: 0.0 standard drinks  . Drug use: No     Allergies  Allergen Reactions  . Bee Venom Hives and Swelling  . Lisinopril Shortness Of Breath and Cough  . Shellfish Allergy Anaphylaxis       Observations/Objective: No vital signs or physical exam conducted as visit was done via telephone  Assessment and Plan:   Follow Up Instructions:    I discussed the assessment and treatment plan with the patient. The patient was provided an opportunity to ask questions and all were answered. The patient agreed with the plan and demonstrated an understanding of the instructions.   The patient was advised to call back or seek an in-person evaluation if the symptoms worsen or if the condition fails to improve as anticipated.  I provided 0 minutes of non-face-to-face time during this encounter.   Crystal Saupe, MD

## 2019-08-11 NOTE — Progress Notes (Signed)
Patient ID: Crystal Burns, female   DOB: 04-04-1981, 39 y.o.   MRN: [id number]     Virtual Visit via Telephone Note  I connected with Crystal Burns on 08/12/19 at 10:50 AM EDT by telephone and verified that I am speaking with the correct person using two identifiers.   I discussed the limitations, risks, security and privacy concerns of performing an evaluation and management service by telephone and the availability of in person appointments. I also discussed with the patient that there may be a patient responsible charge related to this service. The patient expressed understanding and agreed to proceed.  PATIENT visit by telephone virtually in the context of Covid-19 pandemic. Patient location:  home My Location:  [facility] office Persons on the call:  Me and the patient     History of Present Illness: Seen 4/28 UC.  Had Covid several weeks ago.  Tested negative at end of April but still having wheezing, cough, yellow-green mucus.  No respiratory distress.  No GI s/sx.  Blood sugars ~150 or less   From a/p: PDMP not reviewed this encounter.  1. Cough   2. COVID-19   3. Atypical chest pain   4. Moderate persistent asthma without complication   5. Morbid obesity (HCC)     Suspect ongoing cough from COVID 19. In the setting of having asthma, will use IM Depomedrol (per patient's request). Use cough suppression medications. Lung exam clear and 100% pulse oximetry, defer imaging. Counseled patient on potential for adverse effects with medications prescribed/recommended today, ER and return-to-clinic precautions discussed, patient verbalized understanding.    Observations/Objective: NAD.  Occasional wheeze but does not sound winded  Assessment and Plan: 1. Upper respiratory tract infection, unspecified type Post covid-will cover for atypicals - predniSONE (DELTASONE) 10 MG tablet; 6,5,4,3,2,1 take each days dose in am with food  Dispense: 21 tablet; Refill: 0 -  azithromycin (ZITHROMAX) 250 MG tablet; Take 2 day then 1 daily  Dispense: 6 tablet; Refill: 0 - fluconazole (DIFLUCAN) 150 MG tablet; Take 1 tablet (150 mg total) by mouth once for 1 dose.  Dispense: 1 tablet; Refill: 0  2. Encounter for examination following treatment at hospital To ED if worsens.      Follow Up Instructions: See PCP in 6-8 weeks   I discussed the assessment and treatment plan with the patient. The patient was provided an opportunity to ask questions and all were answered. The patient agreed with the plan and demonstrated an understanding of the instructions.   The patient was advised to call back or seek an in-person evaluation if the symptoms worsen or if the condition fails to improve as anticipated.  I provided 12 minutes of non-face-to-face time during this encounter.   Georgian Co, PA-C

## 2019-08-12 ENCOUNTER — Other Ambulatory Visit: Payer: Self-pay

## 2019-08-12 ENCOUNTER — Ambulatory Visit: Payer: Medicaid Other | Attending: Family Medicine | Admitting: Physician Assistant

## 2019-08-12 DIAGNOSIS — J069 Acute upper respiratory infection, unspecified: Secondary | ICD-10-CM

## 2019-08-12 DIAGNOSIS — Z09 Encounter for follow-up examination after completed treatment for conditions other than malignant neoplasm: Secondary | ICD-10-CM

## 2019-08-12 MED ORDER — PREDNISONE 10 MG PO TABS: ORAL_TABLET | ORAL | 0 refills | Status: DC

## 2019-08-12 MED ORDER — AZITHROMYCIN 250 MG PO TABS: ORAL_TABLET | ORAL | 0 refills | Status: DC

## 2019-08-12 MED ORDER — FLUCONAZOLE 150 MG PO TABS: 150.0000 mg | ORAL_TABLET | Freq: Once | ORAL | 0 refills | Status: AC

## 2019-08-12 MED FILL — predniSONE 10 MG TABS: 10 | 6 days supply | Qty: 21 | Fill #0

## 2019-08-12 MED FILL — FLUCONAZOLE 150 MG TABLET: 150 | 1 days supply | Qty: 1 | Fill #0

## 2019-08-12 MED FILL — AZITHROMYCIN 250 MG TABLET: 250 | 5 days supply | Qty: 6 | Fill #0

## 2019-08-24 MED FILL — LOSARTAN POTASSIUM 50 MG TA: 50 | 30 days supply | Qty: 30 | Fill #1

## 2019-08-24 MED FILL — HYDROCHLOROTHIAZIDE 25 MG T: 25 | 30 days supply | Qty: 30 | Fill #0

## 2019-09-24 MED FILL — LOSARTAN POTASSIUM 50 MG TA: 50 | 30 days supply | Qty: 30 | Fill #2

## 2019-09-24 MED FILL — HYDROCHLOROTHIAZIDE 25 MG T: 25 | 30 days supply | Qty: 30 | Fill #1

## 2019-10-21 ENCOUNTER — Other Ambulatory Visit: Payer: Self-pay | Admitting: Family Medicine

## 2019-10-21 DIAGNOSIS — E1165 Type 2 diabetes mellitus with hyperglycemia: Secondary | ICD-10-CM

## 2019-10-21 DIAGNOSIS — I10 Essential (primary) hypertension: Secondary | ICD-10-CM

## 2019-10-21 MED ORDER — METFORMIN HCL 500 MG PO TABS: 1000.0000 mg | ORAL_TABLET | Freq: Two times a day (BID) | ORAL | 0 refills | Status: DC

## 2019-10-21 MED ORDER — LOSARTAN POTASSIUM 50 MG PO TABS: 50.0000 mg | ORAL_TABLET | Freq: Every day | ORAL | 0 refills | Status: DC

## 2019-10-21 NOTE — Telephone Encounter (Signed)
losartan (COZAAR) 50 MG tablet hydrochlorothiazide (HYDRODIURIL) 25 MG tablet metFORMIN (GLUCOPHAGE) 500 MG tablet     Patient is requesting refills.    Pharmacy:  [facility] [place], [place] - [address] Phone:  [phone]  Fax:  [phone]

## 2019-10-22 MED FILL — METFORMIN HCL 500 MG TABS: 500 | 90 days supply | Qty: 360 | Fill #0

## 2019-10-22 MED FILL — LOSARTAN POTASSIUM 50 MG TA: 50 | 90 days supply | Qty: 90 | Fill #0

## 2019-10-27 MED FILL — HYDROCHLOROTHIAZIDE 25 MG T: 25 | 30 days supply | Qty: 30 | Fill #2

## 2019-11-03 ENCOUNTER — Telehealth: Payer: Medicaid Other | Admitting: Physician Assistant

## 2019-11-03 ENCOUNTER — Other Ambulatory Visit: Payer: Self-pay

## 2019-11-03 DIAGNOSIS — R05 Cough: Secondary | ICD-10-CM

## 2019-11-03 DIAGNOSIS — R490 Dysphonia: Secondary | ICD-10-CM | POA: Diagnosis not present

## 2019-11-03 DIAGNOSIS — R059 Cough, unspecified: Secondary | ICD-10-CM

## 2019-11-03 MED ORDER — BENZONATATE 100 MG PO CAPS: 100.0000 mg | ORAL_CAPSULE | Freq: Three times a day (TID) | ORAL | 0 refills | Status: DC | PRN

## 2019-11-03 NOTE — Patient Instructions (Signed)
I sent a refill of Tessalon Perles to your pharmacy, I encourage you to increase your hydration and rest.  I hope that you feel better soon  Roney Jaffe, PA-C Physician Assistant [facility] [email or web address]   Viral Respiratory Infection A respiratory infection is an illness that affects part of the respiratory system, such as the lungs, nose, or throat. A respiratory infection that is caused by a virus is called a viral respiratory infection. Common types of viral respiratory infections include:  A cold.  The flu (influenza).  A respiratory syncytial virus (RSV) infection. What are the causes? This condition is caused by a virus. What are the signs or symptoms? Symptoms of this condition include:  A stuffy or runny nose.  Yellow or green nasal discharge.  A cough.  Sneezing.  Fatigue.  Achy muscles.  A sore throat.  Sweating or chills.  A fever.  A headache. How is this diagnosed? This condition may be diagnosed based on:  Your symptoms.  A physical exam.  Testing of nasal swabs. How is this treated? This condition may be treated with medicines, such as:  Antiviral medicine. This may shorten the length of time a person has symptoms.  Expectorants. These make it easier to cough up mucus.  Decongestant nasal sprays.  Acetaminophen or NSAIDs to relieve fever and pain. Antibiotic medicines are not prescribed for viral infections. This is because antibiotics are designed to kill bacteria. They are not effective against viruses. Follow these instructions at home:  Managing pain and congestion  Take over-the-counter and prescription medicines only as told by your health care provider.  If you have a sore throat, gargle with a salt-water mixture 3-4 times a day or as needed. To make a salt-water mixture, completely dissolve -1 tsp of salt in 1 cup of warm water.  Use nose drops made from salt  water to ease congestion and soften raw skin around your nose.  Drink enough fluid to keep your urine pale yellow. This helps prevent dehydration and helps loosen up mucus. General instructions  Rest as much as possible.  Do not drink alcohol.  Do not use any products that contain nicotine or tobacco, such as cigarettes and e-cigarettes. If you need help quitting, ask your health care provider.  Keep all follow-up visits as told by your health care provider. This is important. How is this prevented?   Get an annual flu shot. You may get the flu shot in late summer, fall, or winter. Ask your health care provider when you should get your flu shot.  Avoid exposing others to your respiratory infection. ? Stay home from work or school as told by your health care provider. ? Wash your hands with soap and water often, especially after you cough or sneeze. If soap and water are not available, use alcohol-based hand sanitizer.  Avoid contact with people who are sick during cold and flu season. This is generally fall and winter. Contact a health care provider if:  Your symptoms last for 10 days or longer.  Your symptoms get worse over time.  You have a fever.  You have severe sinus pain in your face or forehead.  The glands in your jaw or neck become very swollen. Get help right away if you:  Feel pain or pressure in your chest.  Have shortness of breath.  Faint or feel like you will faint.  Have severe and persistent vomiting.  Feel confused or disoriented. Summary  A respiratory infection  is an illness that affects part of the respiratory system, such as the lungs, nose, or throat. A respiratory infection that is caused by a virus is called a viral respiratory infection.  Common types of viral respiratory infections are a cold, influenza, and respiratory syncytial virus (RSV) infection.  Symptoms of this condition include a stuffy or runny nose, cough, sneezing, fatigue,  achy muscles, sore throat, and fevers or chills.  Antibiotic medicines are not prescribed for viral infections. This is because antibiotics are designed to kill bacteria. They are not effective against viruses. This information is not intended to replace advice given to you by your health care provider. Make sure you discuss any questions you have with your health care provider. Document Revised: 04/02/2018 Document Reviewed: 05/05/2017 Elsevier Patient Education  2020 [employer].

## 2019-11-03 NOTE — Progress Notes (Signed)
Established Patient Office Visit  Subjective:  Patient ID: Crystal Burns, female    DOB: [birth year]  Age: 39 y.o. MRN: [id number]  CC:  Chief Complaint  Patient presents with  . URI     Virtual Visit via Telephone Note  I connected with [name] on 11/03/19 at  2:20 PM EDT by telephone and verified that I am speaking with the correct person using two identifiers.  Location: Patient: Home Provider: [facility]   I discussed the limitations, risks, security and privacy concerns of performing an evaluation and management service by telephone and the availability of in person appointments. I also discussed with the patient that there may be a patient responsible charge related to this service. The patient expressed understanding and agreed to proceed.   History of Present Illness: Patient reports that she woke up yesterday with a sore throat, was hoarse, dry cough, nasal congestion.  Reports she has tried ibuprofen, Tylenol, tea with honey without much relief.  Reports her daughter has had similar symptoms, started a couple days prior to hers, states that daughter went to urgent care and tested negative for Covid.  Reports that she did have Covid in April.  Reports that she has been able to eat and drink, has not been able to work, states that she works from home and speaks on the telephone 8 hours a day.   Observations/Objective: Patient history and current medications reviewed, no physical exam completed    Nose stopped up - cough  Able to rest  Daughter was   Had  Eat and drink - hot tea Can't work  - voice was gone -    Past Medical History:  Diagnosis Date  . Asthma    2010-only bothers pt during bad cold  . Depression    2010-had when mother passed away  . History of gonorrhea   . Hypertension    2004  . Obesity   . Pre-diabetes   . Preeclampsia complicating hypertension 01/21/2015    Past Surgical History:  Procedure  Laterality Date  . CHOLECYSTECTOMY    . DILATION AND CURETTAGE OF UTERUS    . WISDOM TOOTH EXTRACTION      Family History  Problem Relation Age of Onset  . Hypertension Mother   . Diabetes Mother   . Cancer Mother        Breast   . Heart attack Mother 44  . Hypertension Father   . Diabetes Father   . Hyperlipidemia Father   . Arthritis Other   . Asthma Other   . Breast cancer Other   . Heart failure Other   . Congenital heart disease Other   . Depression Other   . Heart attack Other     Social History   Socioeconomic History  . Marital status: Single    Spouse name: Not on file  . Number of children: Not on file  . Years of education: Not on file  . Highest education level: Not on file  Occupational History  . Not on file  Tobacco Use  . Smoking status: Former Smoker    Types: Cigarettes    Quit date: 10/31/2010    Years since quitting: 9.0  . Smokeless tobacco: Never Used  Vaping Use  . Vaping Use: Never used  Substance and Sexual Activity  . Alcohol use: No    Alcohol/week: 0.0 standard drinks  . Drug use: No  . Sexual activity: Not Currently  Partners: Male    Birth control/protection: Injection  Other Topics Concern  . 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     Social Determinants of Health   Financial Resource Strain:   . Difficulty of Paying Living Expenses:   Food Insecurity:   . Worried About Programme researcher, broadcasting/film/video in the Last Year:   . Barista in the Last Year:   Transportation Needs:   . Freight forwarder (Medical):   Marland Kitchen Lack of Transportation (Non-Medical):   Physical Activity:   . Days of Exercise per Week:   . Minutes of Exercise per Session:   Stress:   . Feeling of Stress :   Social Connections:   . Frequency of Communication with Friends and Family:   . Frequency of Social Gatherings with Friends and Family:   . Attends Religious Services:   . Active Member  of Clubs or Organizations:   . Attends Banker Meetings:   Marland Kitchen Marital Status:   Intimate Partner Violence:   . Fear of Current or Ex-Partner:   . Emotionally Abused:   Marland Kitchen Physically Abused:   . Sexually Abused:     Outpatient Medications Prior to Visit  Medication Sig Dispense Refill  . albuterol (PROVENTIL HFA;VENTOLIN HFA) 108 (90 Base) MCG/ACT inhaler Inhale 2 puffs into the lungs every 4 (four) hours as needed for wheezing or shortness of breath (cough, shortness of breath or wheezing.). 1 Inhaler 3  . albuterol (PROVENTIL) (2.5 MG/3ML) 0.083% nebulizer solution Take 3 mLs (2.5 mg total) by nebulization every 6 (six) hours as needed for wheezing or shortness of breath. 75 mL 12  . azelastine (ASTELIN) 0.1 % nasal spray Place 2 sprays into both nostrils 2 (two) times daily. Use in each nostril as directed 30 mL 0  . azithromycin (ZITHROMAX) 250 MG tablet Take 2 day then 1 daily 6 tablet 0  . benzonatate (TESSALON) 100 MG capsule Take 1-2 capsules (100-200 mg total) by mouth 3 (three) times daily as needed. 60 capsule 0  . cetirizine (ZYRTEC ALLERGY) 10 MG tablet Take 1 tablet (10 mg total) by mouth daily. 30 tablet 0  . famotidine (PEPCID) 20 MG tablet Take 1 tablet (20 mg total) by mouth at bedtime. 30 tablet 6  . hydrochlorothiazide (HYDRODIURIL) 25 MG tablet Take 1 tablet (25 mg total) by mouth daily. 30 tablet 6  . loperamide (IMODIUM) 2 MG capsule Take 1 capsule (2 mg total) by mouth 4 (four) times daily as needed for diarrhea or loose stools. 12 capsule 0  . losartan (COZAAR) 50 MG tablet Take 1 tablet (50 mg total) by mouth daily. 90 tablet 0  . metFORMIN (GLUCOPHAGE) 500 MG tablet Take 2 tablets (1,000 mg total) by mouth 2 (two) times daily with a meal. 360 tablet 0  . omeprazole (PRILOSEC) 20 MG capsule Take 1 capsule (20 mg total) by mouth daily. 30 capsule 6  . ondansetron (ZOFRAN) 4 MG tablet Take 1 tablet (4 mg total) by mouth every 6 (six) hours. 12 tablet 0  .  polyethylene glycol (MIRALAX) packet Take one capful in large glass several times a day until you go to the bathroom, then once a day 14 each 4  . predniSONE (DELTASONE) 10 MG tablet 6,5,4,3,2,1 take each days dose in am with food 21 tablet 0  . promethazine-dextromethorphan (PROMETHAZINE-DM) 6.25-15 MG/5ML syrup Take 5 mLs by mouth at bedtime  as needed for cough. 100 mL 0  . tiZANidine (ZANAFLEX) 4 MG tablet Take 1 tablet (4 mg total) by mouth every 8 (eight) hours as needed. 30 tablet 0   No facility-administered medications prior to visit.    Allergies  Allergen Reactions  . Bee Venom Hives and Swelling  . Lisinopril Shortness Of Breath and Cough  . Shellfish Allergy Anaphylaxis    ROS Review of Systems  Constitutional: Positive for fatigue and fever. Negative for chills.  HENT: Positive for congestion, sore throat and voice change. Negative for ear pain, postnasal drip, sinus pressure and sinus pain.   Eyes: Negative.   Respiratory: Positive for cough. Negative for shortness of breath and wheezing.   Cardiovascular: Negative.   Gastrointestinal: Negative for nausea and vomiting.  Endocrine: Negative.   Genitourinary: Negative.   Musculoskeletal: Negative.   Skin: Negative.   Allergic/Immunologic: Positive for environmental allergies.  Neurological: Negative.   Hematological: Negative.   Psychiatric/Behavioral: Negative.       Objective:     There were no vitals taken for this visit. Wt Readings from Last 3 Encounters:  05/20/19 (!) 323 lb 12.8 oz (146.9 kg)  05/04/19 (!) 322 lb (146.1 kg)  03/23/19 (!) 328 lb (148.8 kg)     Health Maintenance Due  Topic Date Due  . Hepatitis C Screening  Never done  . FOOT EXAM  Never done  . OPHTHALMOLOGY EXAM  Never done  . COVID-19 Vaccine (1) Never done  . TETANUS/TDAP  Never done  . PAP SMEAR-Modifier  06/30/2015  . HEMOGLOBIN A1C  10/20/2019    There are no preventive care reminders to display for this patient.  Lab  Results  Component Value Date   TSH 1.950 04/22/2019   Lab Results  Component Value Date   WBC 7.9 11/11/2017   HGB 10.8 (L) 11/11/2017   HCT 36.1 11/11/2017   MCV 82.6 11/11/2017   PLT 308 11/11/2017   Lab Results  Component Value Date   NA 141 04/22/2019   K 4.2 04/22/2019   CO2 27 04/22/2019   GLUCOSE 92 04/22/2019   BUN 10 04/22/2019   CREATININE 0.62 04/22/2019   BILITOT 0.3 04/22/2019   ALKPHOS 85 04/22/2019   AST 13 04/22/2019   ALT 14 04/22/2019   PROT 6.5 04/22/2019   ALBUMIN 3.8 04/22/2019   CALCIUM 9.9 04/22/2019   ANIONGAP 8 04/18/2016   Lab Results  Component Value Date   CHOL 143 07/23/2016   Lab Results  Component Value Date   HDL 28 (L) 07/23/2016   Lab Results  Component Value Date   LDLCALC 92 07/23/2016   Lab Results  Component Value Date   TRIG 113 07/23/2016   Lab Results  Component Value Date   CHOLHDL 5.1 (H) 07/23/2016   Lab Results  Component Value Date   HGBA1C 6.3 (H) 04/22/2019      Assessment & Plan:   Problem List Items Addressed This Visit    None    Assessment and Plan:  1. Cough Patient education given, rest, increase hydration, continue symptomatic treatment.  Red flags given for prompt evaluation at urgent care  - benzonatate (TESSALON) 100 MG capsule; Take 1-2 capsules (100-200 mg total) by mouth 3 (three) times daily as needed.  Dispense: 60 capsule; Refill: 0  2. Hoarseness of voice   Follow Up Instructions:    I discussed the assessment and treatment plan with the patient. The patient was provided an opportunity to ask questions and  all were answered. The patient agreed with the plan and demonstrated an understanding of the instructions.   The patient was advised to call back or seek an in-person evaluation if the symptoms worsen or if the condition fails to improve as anticipated.  I provided 21 minutes of non-face-to-face time during this encounter.   Xerxes Agrusa S Mayers, PA-C  No orders of the  defined types were placed in this encounter.   Follow-up: No follow-ups on file.    Kasandra Knudsen Mayers, PA-C

## 2019-11-03 NOTE — Progress Notes (Signed)
Patient verified DOB Patient complains of waking up yesterday at 6am with sore throat, horse, congestion. Patient tired tylenol, ibuprofen, alka-seltzer, throat soothing tea with lemon and honey.  Oldest daughter went to [facility] for similar symptoms and COVID was negative.

## 2019-11-30 ENCOUNTER — Ambulatory Visit: Payer: Self-pay

## 2019-11-30 NOTE — Telephone Encounter (Signed)
Patient called with questions about COVID-19 vaccines and which would be best for her. Patient was informed that Fransisca Kaufmann was now FDA approved. It involves a series of 2 injections. Gala Murdoch also is series of 2 vaccines and J/J vaccine is just one injection. Patient verbalized understanding states she has had COVID-19 in April and did not recover until June. She expressed concern because she knows people who have gotten sick from vaccine. Will route note to office for Dr Alvis Lemmings to address tomorrow. Patient is hoping to get her vaccination tomorrow but needs more information. Reason for Disposition . [1] Caller requesting NON-URGENT health information AND [2] PCP's office is the best resource  Answer Assessment - Initial Assessment Questions 1. REASON FOR CALL or QUESTION: "What is your reason for calling today?" or "How can I best help you?" or "What question do you have that I can help answer?"     What COVID-19 vaccine is best for her.  Protocols used: INFORMATION ONLY CALL - NO TRIAGE-A-AH

## 2019-12-01 NOTE — Telephone Encounter (Signed)
Returned call to patient and informed her of PCP response.

## 2019-12-01 NOTE — Telephone Encounter (Signed)
I would recommend she received whichever COVID-19 vaccine is available to her.

## 2019-12-02 MED FILL — HYDROCHLOROTHIAZIDE 25 MG T: 25 | 90 days supply | Qty: 90 | Fill #3

## 2019-12-10 ENCOUNTER — Encounter: Payer: Self-pay | Admitting: Family Medicine

## 2019-12-10 ENCOUNTER — Ambulatory Visit: Payer: Medicaid Other | Attending: Family Medicine | Admitting: Family Medicine

## 2019-12-10 DIAGNOSIS — H6981 Other specified disorders of Eustachian tube, right ear: Secondary | ICD-10-CM

## 2019-12-10 DIAGNOSIS — H9201 Otalgia, right ear: Secondary | ICD-10-CM

## 2019-12-10 DIAGNOSIS — H6991 Unspecified Eustachian tube disorder, right ear: Secondary | ICD-10-CM

## 2019-12-10 MED ORDER — AZELASTINE HCL 0.1 % NA SOLN: 2.0000 | Freq: Two times a day (BID) | NASAL | 2 refills | Status: DC

## 2019-12-10 MED ORDER — CETIRIZINE HCL 10 MG PO TABS: 10.0000 mg | ORAL_TABLET | Freq: Every day | ORAL | 3 refills | Status: DC

## 2019-12-10 MED FILL — AZELASTINE HCL 137 MCG SPRY: 0.1 | 30 days supply | Qty: 30 | Fill #0

## 2019-12-10 MED FILL — CETIRIZINE HCL 10 MG TABS: 10 | 30 days supply | Qty: 30 | Fill #0

## 2019-12-10 NOTE — Progress Notes (Signed)
Right ear pain and popping since February Feeling nauseated and has pain 5/10    Took Covid- 19 vaccine [employer]) 8/26

## 2019-12-10 NOTE — Progress Notes (Signed)
Virtual Visit via Telephone Note  I connected with Crystal Burns on 12/10/19 at  9:50 AM EDT by telephone and verified that I am speaking with the correct person using two identifiers.  Location: Patient: Home Provider: [facility]   I discussed the limitations, risks, security and privacy concerns of performing an evaluation and management service by telephone and the availability of in person appointments. I also discussed with the patient that there may be a patient responsible charge related to this service. The patient expressed understanding and agreed to proceed.   History of Present Illness:      39 yo female with complaint of approximately 4 days of acute right ear pain with popping sensation.  She reports that she has had popping sensation off and on since having an ear infection earlier in the year.  Due to the Covid pandemic, she was not able to keep ENT follow-up.  She now reports that she has had pain off and on in the right ear along with a popping sensation for 4 days.  Quality of the pain is aching, similar to prior issues with her ear.  Pain is about a 5 on a 0-to-10 scale with 0 being no pain and 10 being the worst possible pain.  She is not currently on any medications for allergic rhinitis/eustachian tube dysfunction.  She does have some mild nasal congestion and mild postnasal drainage.  Her ear does not hurt to touch and she does not have any drainage from the ear.  No sore throat.  No cough or shortness of breath.  No headache, dizziness or sensation of feeling off balance.  She did have nausea x1 yesterday which has resolved.  She is not sure of the cause of the nausea but reports that she did receive COVID-19 immunization last week and is not sure if this could be related.  She does have upcoming ENT appointment at the end of September.  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    Observations/Objective: Patient was alert and oriented and able to speak/complete sentences in a normal manner.  No cough or shortness of breath noted during conversation.  Assessment and Plan: 1. Right ear pain 2. Dysfunction of right eustachian tube Discussed with the patient that her symptoms are likely related to eustachian tube dysfunction which may be secondary to allergic rhinitis.  She is to keep her upcoming ENT appointment.  Refills are being provided for cetirizine and Astelin as patient is not currently taking these medications.  She is encouraged to use these medications daily. Work note will be provided for 12/09/2019. She has been asked to come into the office for in person visit next week if ear pain does not resolve. - cetirizine (ZYRTEC ALLERGY) 10 MG tablet; Take 1 tablet (10 mg total) by mouth daily. In the evening for nasal congestion  Dispense: 30 tablet; Refill: 3 - azelastine (ASTELIN) 0.1 % nasal spray; Place 2 sprays into both nostrils 2 (two) times daily. Use in each nostril as directed  Dispense: 30 mL; Refill: 2  Follow Up Instructions: Return in about 1 week (around 12/17/2019) for ear pain- next week in person if symptoms have not improved.    I discussed the assessment and treatment plan with the patient. The patient was provided an opportunity to ask questions and all were answered. The  patient agreed with the plan and demonstrated an understanding of the instructions.   The patient was advised to call back or seek an in-person evaluation if the symptoms worsen or if the condition fails to improve as anticipated.  I provided 11 minutes of non-face-to-face time during this encounter.   Cain Saupe, MD

## 2019-12-21 ENCOUNTER — Telehealth: Payer: Self-pay | Admitting: Family Medicine

## 2019-12-21 NOTE — Telephone Encounter (Signed)
Pt stated that she was advised during her last phone visit with Dr. Jillyn Hidden to call if she was not feeling better/ pt still having pain in her ears and throat and was advised to call so an antibiotic can be sent to pharmacy / please advise

## 2019-12-21 NOTE — Telephone Encounter (Signed)
Patient states that she is still having ear pain.

## 2019-12-22 ENCOUNTER — Other Ambulatory Visit: Payer: Self-pay | Admitting: Family Medicine

## 2019-12-22 DIAGNOSIS — H9201 Otalgia, right ear: Secondary | ICD-10-CM

## 2019-12-22 MED ORDER — NEOMYCIN-POLYMYXIN-HC 3.5-10000-1 OT SUSP: 4.0000 [drp] | Freq: Four times a day (QID) | OTIC | 0 refills | Status: AC

## 2019-12-22 MED FILL — NEO/POLYMYXIN/HC EAR SUSP: 3.5-10000-1 | 9 days supply | Qty: 10 | Fill #0

## 2019-12-22 NOTE — Telephone Encounter (Signed)
Can you ask her to make an in-person appointment about her ear pain? I will send in antibiotic ear drops until she can schedule follow-up in person to have her ear pain assessed

## 2019-12-22 NOTE — Telephone Encounter (Signed)
She was also advised to make an in-person appointment so that someone could visualize her ear to see why she is having ear pain. Please try to schedule patient for appointment in office

## 2019-12-22 NOTE — Telephone Encounter (Signed)
Please schedule patient with any available provider for in person appointment for ear pain.

## 2019-12-22 NOTE — Telephone Encounter (Signed)
Called patient and informed her that we did not have any appt available at [facility] but she could be seen at the mobile unit. Patient states that she is not able to be seen at the mobile unit and will go to urgent care.

## 2019-12-22 NOTE — Progress Notes (Signed)
Patient ID: Crystal Burns, female   DOB: [birth year], 39 y.o.   MRN: [id number]   See phone message. Patient with complaint of chronic right ear pain and would like an antibiotic sent to pharmacy. Will send in RX for ear drop but she will also be contacted to make an in-person appointment in follow-up of her ear pain as discussed at her last visit which was done by phone.

## 2019-12-23 ENCOUNTER — Ambulatory Visit (HOSPITAL_COMMUNITY)
Admission: EM | Admit: 2019-12-23 | Discharge: 2019-12-23 | Disposition: A | Discharge status: Home / Self Care | Payer: Medicaid Other | Attending: Family Medicine | Admitting: Family Medicine

## 2019-12-23 ENCOUNTER — Other Ambulatory Visit: Payer: Self-pay

## 2019-12-23 ENCOUNTER — Encounter (HOSPITAL_COMMUNITY): Payer: Self-pay | Admitting: Emergency Medicine

## 2019-12-23 DIAGNOSIS — H6983 Other specified disorders of Eustachian tube, bilateral: Secondary | ICD-10-CM | POA: Diagnosis not present

## 2019-12-23 MED ORDER — PREDNISONE 50 MG PO TABS: ORAL_TABLET | ORAL | 0 refills | Status: DC

## 2019-12-23 MED ORDER — MONTELUKAST SODIUM 10 MG PO TABS: 10.0000 mg | ORAL_TABLET | Freq: Every day | ORAL | 0 refills | Status: DC

## 2019-12-23 NOTE — ED Provider Notes (Signed)
[facility]    CSN: [id number] Arrival date & time: 12/23/19  1958      History   Chief Complaint Chief Complaint  Patient presents with  . Otalgia    HPI Crystal Burns is a 39 y.o. female.   Here today for ongoing intermittent b/l ear pain, pressure and popping since February. Notes she's had one ear infection since then and is concerned because the past 2 days her right ear has started hurting more. Has tried allergy medications and nasal sprays without relief. Denies fever, chills, drainage, muffled hearing sore throat, cough. Has appt with ENT for this issue next week.      Past Medical History:  Diagnosis Date  . Asthma    2010-only bothers pt during bad cold  . Depression    2010-had when mother passed away  . History of gonorrhea   . Hypertension    2004  . Obesity   . Pre-diabetes   . Preeclampsia complicating hypertension 01/21/2015    Patient Active Problem List   Diagnosis Date Noted  . Precordial chest pain 05/03/2019  . Educated about COVID-19 virus infection 05/03/2019  . Hoarseness of voice 03/17/2018  . Gastroesophageal reflux disease without esophagitis 03/17/2018  . Supervision of normal pregnancy, antepartum 12/15/2017  . Acute medial meniscus tear of right knee 02/10/2017  . Vitamin D deficiency 07/10/2015  . Anxiety 06/20/2015  . Costochondritis 05/18/2015  . Primary osteoarthritis of right knee 04/22/2014  . Essential hypertension 07/18/2012  . LUMBOSACRAL STRAIN, ACUTE 07/05/2008  . Morbid obesity (HCC) 02/24/2008  . Asthma with exacerbation 02/24/2008    Past Surgical History:  Procedure Laterality Date  . CHOLECYSTECTOMY    . DILATION AND CURETTAGE OF UTERUS    . WISDOM TOOTH EXTRACTION      OB History    Gravida  11   Para  7   Term  7   Preterm  0   AB  4   Living  6     SAB  4   TAB  0   Ectopic  0   Multiple  0   Live Births  7            Home Medications    Prior to Admission  medications   Medication Sig Start Date End Date Taking? Authorizing Provider  albuterol (PROVENTIL HFA;VENTOLIN HFA) 108 (90 Base) MCG/ACT inhaler Inhale 2 puffs into the lungs every 4 (four) hours as needed for wheezing or shortness of breath (cough, shortness of breath or wheezing.). 03/17/18   Storm Frisk, MD  albuterol (PROVENTIL) (2.5 MG/3ML) 0.083% nebulizer solution Take 3 mLs (2.5 mg total) by nebulization every 6 (six) hours as needed for wheezing or shortness of breath. 08/07/17   Elvina Sidle, MD  azelastine (ASTELIN) 0.1 % nasal spray Place 2 sprays into both nostrils 2 (two) times daily. Use in each nostril as directed 12/10/19   Fulp, Cammie, MD  cetirizine (ZYRTEC ALLERGY) 10 MG tablet Take 1 tablet (10 mg total) by mouth daily. In the evening for nasal congestion 12/10/19   Fulp, Cammie, MD  hydrochlorothiazide (HYDRODIURIL) 25 MG tablet Take 1 tablet (25 mg total) by mouth daily. 04/22/19   Anders Simmonds, PA-C  loperamide (IMODIUM) 2 MG capsule Take 1 capsule (2 mg total) by mouth 4 (four) times daily as needed for diarrhea or loose stools. Patient not taking: Reported on 11/03/2019 06/21/18   Eustace Moore, MD  losartan (COZAAR) 50 MG tablet  Take 1 tablet (50 mg total) by mouth daily. 10/21/19 01/19/20  Anders Simmonds, PA-C  metFORMIN (GLUCOPHAGE) 500 MG tablet Take 2 tablets (1,000 mg total) by mouth 2 (two) times daily with a meal. 10/21/19   McClung, Marzella Schlein, PA-C  montelukast (SINGULAIR) 10 MG tablet Take 1 tablet (10 mg total) by mouth at bedtime. 12/23/19   Particia Nearing, PA-C  neomycin-polymyxin-hydrocortisone (CORTISPORIN) 3.5-10000-1 OTIC suspension Place 4 drops into the right ear 4 (four) times daily for 7 days. 12/22/19 12/29/19  Fulp, Cammie, MD  omeprazole (PRILOSEC) 20 MG capsule Take 1 capsule (20 mg total) by mouth daily. 03/17/18   Storm Frisk, MD  polyethylene glycol [facility]) packet Take one capful in large glass several times a day until  you go to the bathroom, then once a day 05/20/18   Elvina Sidle, MD  predniSONE (DELTASONE) 50 MG tablet Take 1 tab daily for 3 days 12/23/19   Particia Nearing, PA-C  fluticasone [facility]) 50 MCG/ACT nasal spray Place 2 sprays into both nostrils daily. 06/03/19 06/25/19  Hoy Register, MD    Family History Family History  Problem Relation Age of Onset  . Hypertension Mother   . Diabetes Mother   . Cancer Mother        Breast   . Heart attack Mother 15  . Hypertension Father   . Diabetes Father   . Hyperlipidemia Father   . Arthritis Other   . Asthma Other   . Breast cancer Other   . Heart failure Other   . Congenital heart disease Other   . Depression Other   . Heart attack Other     Social History Social History   Tobacco Use  . Smoking status: Former Smoker    Types: Cigarettes    Quit date: 10/31/2010    Years since quitting: 9.1  . Smokeless tobacco: Never Used  Vaping Use  . Vaping Use: Never used  Substance Use Topics  . Alcohol use: No    Alcohol/week: 0.0 standard drinks  . Drug use: No     Allergies   Bee venom, Lisinopril, and Shellfish allergy   Review of Systems Review of Systems PER HPI    Physical Exam Triage Vital Signs ED Triage Vitals  Enc Vitals Group     BP 12/23/19 2048 131/89     Pulse Rate 12/23/19 2048 82     Resp 12/23/19 2048 16     Temp 12/23/19 2048 98.4 F (36.9 C)     Temp Source 12/23/19 2048 Oral     SpO2 12/23/19 2048 99 %     Weight --      Height --      Head Circumference --      Peak Flow --      Pain Score 12/23/19 2045 5     Pain Loc --      Pain Edu? --      Excl. in GC? --    No data found.  Updated Vital Signs BP 131/89 (BP Location: Left Arm)   Pulse 82   Temp 98.4 F (36.9 C) (Oral)   Resp 16   LMP  (LMP Unknown)   SpO2 99%   Visual Acuity Right Eye Distance:   Left Eye Distance:   Bilateral Distance:    Right Eye Near:   Left Eye Near:    Bilateral Near:     Physical  Exam Vitals and nursing note reviewed.  Constitutional:  Appearance: Normal appearance. She is not ill-appearing.  HENT:     Head: Atraumatic.     Ears:     Comments: B/l middle ear effusion, worse on right with some TM opacity noted     Nose:     Comments: Nasal mucosa boggy and erythematous     Mouth/Throat:     Mouth: Mucous membranes are moist.     Pharynx: Posterior oropharyngeal erythema present.  Eyes:     Extraocular Movements: Extraocular movements intact.     Conjunctiva/sclera: Conjunctivae normal.  Cardiovascular:     Rate and Rhythm: Normal rate and regular rhythm.     Heart sounds: Normal heart sounds.  Pulmonary:     Effort: Pulmonary effort is normal.     Breath sounds: Normal breath sounds.  Musculoskeletal:        General: Normal range of motion.     Cervical back: Normal range of motion and neck supple.  Skin:    General: Skin is warm and dry.  Neurological:     Mental Status: She is alert and oriented to person, place, and time.  Psychiatric:        Mood and Affect: Mood normal.        Thought Content: Thought content normal.        Judgment: Judgment normal.      UC Treatments / Results  Labs (all labs ordered are listed, but only abnormal results are displayed) Labs Reviewed - No data to display  EKG   Radiology No results found.  Procedures Procedures (including critical care time)  Medications Ordered in UC Medications - No data to display  Initial Impression / Assessment and Plan / UC Course  I have reviewed the triage vital signs and the nursing notes.  Pertinent labs & imaging results that were available during my care of the patient were reviewed by me and considered in my medical decision making (see chart for details).     Eustachian tube dysfunction from allergic rhinitis - tx with prednisone burst, add singulair to allergy regimen. F/u with ENT as scheduled.   Final Clinical Impressions(s) / UC Diagnoses   Final  diagnoses:  Eustachian tube dysfunction, bilateral   Discharge Instructions   None    ED Prescriptions    Medication Sig Dispense Auth. Provider   montelukast (SINGULAIR) 10 MG tablet Take 1 tablet (10 mg total) by mouth at bedtime. 30 tablet Particia Nearing, [place]   predniSONE (DELTASONE) 50 MG tablet Take 1 tab daily for 3 days 3 tablet Particia Nearing, [place]     PDMP not reviewed this encounter.   Particia Nearing, [place] 12/23/19 2127

## 2019-12-23 NOTE — ED Triage Notes (Signed)
Pt c/o bilateral ear pain since February. Pt states her ears constantly pop and that she has had a lot of sinus pressure. Pt states she is going to see an ENT on the 28th.

## 2019-12-24 MED FILL — predniSONE 10 MG TABS: 10 | 3 days supply | Qty: 15 | Fill #0

## 2019-12-24 MED FILL — MONTELUKAST SOD 10 MG TAB: 10 | 30 days supply | Qty: 30 | Fill #0

## 2020-01-13 ENCOUNTER — Ambulatory Visit: Payer: Medicaid Other | Admitting: Obstetrics

## 2020-01-20 ENCOUNTER — Other Ambulatory Visit: Payer: Self-pay | Admitting: Physician Assistant

## 2020-01-20 DIAGNOSIS — I10 Essential (primary) hypertension: Secondary | ICD-10-CM

## 2020-01-21 MED FILL — LOSARTAN POTASSIUM 50 MG TA: 50 | 90 days supply | Qty: 90 | Fill #0

## 2020-02-10 ENCOUNTER — Ambulatory Visit (HOSPITAL_COMMUNITY): Payer: Medicaid Other

## 2020-02-10 ENCOUNTER — Encounter (HOSPITAL_COMMUNITY): Payer: Self-pay

## 2020-02-10 ENCOUNTER — Other Ambulatory Visit: Payer: Self-pay

## 2020-02-10 ENCOUNTER — Ambulatory Visit (HOSPITAL_COMMUNITY)
Admission: EM | Admit: 2020-02-10 | Discharge: 2020-02-10 | Disposition: A | Discharge status: Home / Self Care | Payer: Medicaid Other | Attending: Family Medicine | Admitting: Family Medicine

## 2020-02-10 ENCOUNTER — Ambulatory Visit (INDEPENDENT_AMBULATORY_CARE_PROVIDER_SITE_OTHER): Payer: Medicaid Other

## 2020-02-10 DIAGNOSIS — M1990 Unspecified osteoarthritis, unspecified site: Secondary | ICD-10-CM

## 2020-02-10 DIAGNOSIS — M25562 Pain in left knee: Secondary | ICD-10-CM | POA: Diagnosis not present

## 2020-02-10 DIAGNOSIS — M25561 Pain in right knee: Secondary | ICD-10-CM | POA: Diagnosis not present

## 2020-02-10 DIAGNOSIS — Z043 Encounter for examination and observation following other accident: Secondary | ICD-10-CM | POA: Diagnosis not present

## 2020-02-10 DIAGNOSIS — S8990XA Unspecified injury of unspecified lower leg, initial encounter: Secondary | ICD-10-CM | POA: Diagnosis not present

## 2020-02-10 MED ORDER — DEXAMETHASONE SODIUM PHOSPHATE 10 MG/ML IJ SOLN
10.0000 mg | Freq: Once | INTRAMUSCULAR | Status: AC
  Administered 2020-02-10: 10 mg via INTRAMUSCULAR

## 2020-02-10 MED ORDER — DEXAMETHASONE SODIUM PHOSPHATE 10 MG/ML IJ SOLN
INTRAMUSCULAR | Status: AC
  Filled 2020-02-10: qty 1

## 2020-02-10 MED ORDER — KETOROLAC TROMETHAMINE 60 MG/2ML IM SOLN
60.0000 mg | Freq: Once | INTRAMUSCULAR | Status: AC
  Administered 2020-02-10: 60 mg via INTRAMUSCULAR

## 2020-02-10 MED ORDER — KETOROLAC TROMETHAMINE 60 MG/2ML IM SOLN
INTRAMUSCULAR | Status: AC
  Filled 2020-02-10: qty 2

## 2020-02-10 NOTE — Discharge Instructions (Addendum)
Follow-up with your primary care provider for new referral to orthopedics as your right knee needs further evaluation given imaging showed progression of previously diagnosed arthritis. For pain you can take ibuprofen 600 mg 3 times daily/ every 8 hours and arthritis strength Tylenol 650 mg every 4-6 hours as needed.

## 2020-02-10 NOTE — ED Triage Notes (Signed)
Pt presents with bilateral knee pain and generalized body aches after a fall in a parking deck yesterday; pt states she normally has chronic knee pain but the fall yesterday made the pain much worse.

## 2020-02-10 NOTE — ED Provider Notes (Signed)
[facility]    CSN: [id number] Arrival date & time: 02/10/20  1802      History   Chief Complaint Chief Complaint  Patient presents with  . Fall  . Knee Pain    Bilateral  . Generalized Body Aches    HPI Crystal Burns is a 39 y.o. female.   HPI  Patient presents today with bilateral knee pain related to a trip and fall she sustained yesterday.  Patient reports falling and landing directly on both of her knees.  She denies any head injury any loss of consciousness.  She states that since the fall her whole body aches.  She has not taken any medication for the pain such as Tylenol or ibuprofen.  She is able to ambulate however endorses pain with weightbearing.  Past Medical History:  Diagnosis Date  . Asthma    2010-only bothers pt during bad cold  . Depression    2010-had when mother passed away  . History of gonorrhea   . Hypertension    2004  . Obesity   . Pre-diabetes   . Preeclampsia complicating hypertension 01/21/2015    Patient Active Problem List   Diagnosis Date Noted  . Precordial chest pain 05/03/2019  . Educated about COVID-19 virus infection 05/03/2019  . Hoarseness of voice 03/17/2018  . Gastroesophageal reflux disease without esophagitis 03/17/2018  . Supervision of normal pregnancy, antepartum 12/15/2017  . Acute medial meniscus tear of right knee 02/10/2017  . Vitamin D deficiency 07/10/2015  . Anxiety 06/20/2015  . Costochondritis 05/18/2015  . Primary osteoarthritis of right knee 04/22/2014  . Essential hypertension 07/18/2012  . LUMBOSACRAL STRAIN, ACUTE 07/05/2008  . Morbid obesity (HCC) 02/24/2008  . Asthma with exacerbation 02/24/2008    Past Surgical History:  Procedure Laterality Date  . CHOLECYSTECTOMY    . DILATION AND CURETTAGE OF UTERUS    . WISDOM TOOTH EXTRACTION      OB History    Gravida  11   Para  7   Term  7   Preterm  0   AB  4   Living  6     SAB  4   TAB  0   Ectopic  0    Multiple  0   Live Births  7            Home Medications    Prior to Admission medications   Medication Sig Start Date End Date Taking? Authorizing Provider  albuterol (PROVENTIL HFA;VENTOLIN HFA) 108 (90 Base) MCG/ACT inhaler Inhale 2 puffs into the lungs every 4 (four) hours as needed for wheezing or shortness of breath (cough, shortness of breath or wheezing.). 03/17/18   Storm Frisk, MD  albuterol (PROVENTIL) (2.5 MG/3ML) 0.083% nebulizer solution Take 3 mLs (2.5 mg total) by nebulization every 6 (six) hours as needed for wheezing or shortness of breath. 08/07/17   Elvina Sidle, MD  azelastine (ASTELIN) 0.1 % nasal spray Place 2 sprays into both nostrils 2 (two) times daily. Use in each nostril as directed 12/10/19   Fulp, Cammie, MD  cetirizine (ZYRTEC ALLERGY) 10 MG tablet Take 1 tablet (10 mg total) by mouth daily. In the evening for nasal congestion 12/10/19   Fulp, Cammie, MD  hydrochlorothiazide (HYDRODIURIL) 25 MG tablet Take 1 tablet (25 mg total) by mouth daily. 04/22/19   Anders Simmonds, PA-C  loperamide (IMODIUM) 2 MG capsule Take 1 capsule (2 mg total) by mouth 4 (four) times daily as needed for  diarrhea or loose stools. Patient not taking: Reported on 11/03/2019 06/21/18   Eustace Moore, MD  losartan (COZAAR) 50 MG tablet TAKE 1 TABLET (50 MG TOTAL) BY MOUTH DAILY. 01/20/20 04/19/20  Anders Simmonds, PA-C  metFORMIN (GLUCOPHAGE) 500 MG tablet Take 2 tablets (1,000 mg total) by mouth 2 (two) times daily with a meal. 10/21/19   McClung, Marzella Schlein, PA-C  montelukast (SINGULAIR) 10 MG tablet Take 1 tablet (10 mg total) by mouth at bedtime. 12/23/19   Particia Nearing, PA-C  omeprazole (PRILOSEC) 20 MG capsule Take 1 capsule (20 mg total) by mouth daily. 03/17/18   Storm Frisk, MD  polyethylene glycol [facility]) packet Take one capful in large glass several times a day until you go to the bathroom, then once a day 05/20/18   Elvina Sidle, MD  predniSONE  (DELTASONE) 50 MG tablet Take 1 tab daily for 3 days 12/23/19   Particia Nearing, PA-C  fluticasone [facility]) 50 MCG/ACT nasal spray Place 2 sprays into both nostrils daily. 06/03/19 06/25/19  Hoy Register, MD    Family History Family History  Problem Relation Age of Onset  . Hypertension Mother   . Diabetes Mother   . Cancer Mother        Breast   . Heart attack Mother 82  . Hypertension Father   . Diabetes Father   . Hyperlipidemia Father   . Arthritis Other   . Asthma Other   . Breast cancer Other   . Heart failure Other   . Congenital heart disease Other   . Depression Other   . Heart attack Other     Social History Social History   Tobacco Use  . Smoking status: Former Smoker    Types: Cigarettes    Quit date: 10/31/2010    Years since quitting: 9.2  . Smokeless tobacco: Never Used  Vaping Use  . Vaping Use: Never used  Substance Use Topics  . Alcohol use: No    Alcohol/week: 0.0 standard drinks  . Drug use: No     Allergies   Bee venom, Lisinopril, and Shellfish allergy   Review of Systems Review of Systems Pertinent negatives listed in HPI  Physical Exam Triage Vital Signs ED Triage Vitals  Enc Vitals Group     BP 02/10/20 1927 (!) 143/97     Pulse Rate 02/10/20 1927 89     Resp 02/10/20 1927 18     Temp 02/10/20 1927 (!) 97.5 F (36.4 C)     Temp Source 02/10/20 1927 Oral     SpO2 02/10/20 1927 99 %     Weight --      Height --      Head Circumference --      Peak Flow --      Pain Score 02/10/20 1926 9     Pain Loc --      Pain Edu? --      Excl. in GC? --    No data found.  Updated Vital Signs BP (!) 143/97 (BP Location: Right Arm)   Pulse 89   Temp (!) 97.5 F (36.4 C) (Oral)   Resp 18   SpO2 99%   Visual Acuity Right Eye Distance:   Left Eye Distance:   Bilateral Distance:    Right Eye Near:   Left Eye Near:    Bilateral Near:     Physical Exam Constitutional:      Appearance: She is obese.  Cardiovascular:  Rate and Rhythm: Normal rate and regular rhythm.  Pulmonary:     Effort: Pulmonary effort is normal.     Breath sounds: Normal breath sounds.  Musculoskeletal:       Legs:  Neurological:     General: No focal deficit present.     Mental Status: She is oriented to person, place, and time.      UC Treatments / Results  Labs (all labs ordered are listed, but only abnormal results are displayed) Labs Reviewed - No data to display  EKG   Radiology DG Knee Complete 4 Views Left  Result Date: 02/10/2020 CLINICAL DATA:  Fall landing on both knees EXAM: LEFT KNEE - COMPLETE 4+ VIEW COMPARISON:  None. FINDINGS: No evidence of fracture, dislocation, or joint effusion. No evidence of arthropathy or other focal bone abnormality. Soft tissues are unremarkable. IMPRESSION: Negative. Electronically Signed   By: Jonna Clark M.D.   On: 02/10/2020 20:06   DG Knee Complete 4 Views Right  Result Date: 02/10/2020 CLINICAL DATA:  Recent fall with knee pain, initial encounter EXAM: RIGHT KNEE - COMPLETE 4+ VIEW COMPARISON:  02/04/2017 FINDINGS: Degenerative changes are noted most concentrated in the medial joint space. No acute fracture or dislocation is noted. No soft tissue abnormality is noted. IMPRESSION: Progressive degenerative changes when compare with the prior exam. No acute abnormality noted. Electronically Signed   By: Alcide Clever M.D.   On: 02/10/2020 20:04    Procedures Procedures (including critical care time)  Medications Ordered in UC Medications  ketorolac (TORADOL) injection 60 mg (60 mg Intramuscular Given 02/10/20 2035)  dexamethasone (DECADRON) injection 10 mg (10 mg Intramuscular Given 02/10/20 2035)    Initial Impression / Assessment and Plan / UC Course  I have reviewed the triage vital signs and the nursing notes.  Pertinent labs & imaging results that were available during my care of the patient were reviewed by me and considered in my medical decision making (see  chart for details).    Patient presents with bilateral knee pain and generalized aches following a trip and fall accident yesterday.  No head injury.  Patient overall reassuring.  Patient given a Toradol and Decadron injection while here in clinic today for inflammation related to chronic arthritis involving the medial knee joint of the right knee which is likely causing knee pain.  Left knee imaging is negative.  Patient advised to take ibuprofen and Tylenol as needed for pain.  Patient also she would likely need to follow-up with orthopedic for further evaluation of chronic knee disease involving the right knee.  Patient verbalized understanding agreement with plan.  Final Clinical Impressions(s) / UC Diagnoses   Final diagnoses:  Knee injury, unspecified laterality, initial encounter  Arthritis, bilateral knee     Discharge Instructions     Follow-up with your primary care provider for new referral to orthopedics as your right knee needs further evaluation given imaging showed progression of previously diagnosed arthritis. For pain you can take ibuprofen 600 mg 3 times daily/ every 8 hours and arthritis strength Tylenol 650 mg every 4-6 hours as needed.    ED Prescriptions    None     PDMP not reviewed this encounter.   Bing Neighbors, FNP 02/14/20 1221

## 2020-02-28 ENCOUNTER — Encounter: Payer: Self-pay | Admitting: Orthopaedic Surgery

## 2020-02-28 ENCOUNTER — Ambulatory Visit (INDEPENDENT_AMBULATORY_CARE_PROVIDER_SITE_OTHER): Payer: Medicaid Other | Admitting: Orthopaedic Surgery

## 2020-02-28 DIAGNOSIS — M170 Bilateral primary osteoarthritis of knee: Secondary | ICD-10-CM | POA: Insufficient documentation

## 2020-02-28 MED ORDER — BUPIVACAINE HCL 0.5 % IJ SOLN
2.0000 mL | INTRAMUSCULAR | Status: AC | PRN
  Administered 2020-02-28: 2 mL via INTRA_ARTICULAR

## 2020-02-28 MED ORDER — LIDOCAINE HCL 1 % IJ SOLN
2.0000 mL | INTRAMUSCULAR | Status: AC | PRN
  Administered 2020-02-28: 2 mL

## 2020-02-28 MED ORDER — METHYLPREDNISOLONE ACETATE 40 MG/ML IJ SUSP
80.0000 mg | INTRAMUSCULAR | Status: AC | PRN
  Administered 2020-02-28: 80 mg via INTRA_ARTICULAR

## 2020-02-28 NOTE — Progress Notes (Signed)
Office Visit Note   Patient: Crystal Burns           Date of Birth: [birth year]           MRN: [id number] Visit Date: 02/28/2020              Requested by: Hoy Register, MD [address] [place],  [place] [address] PCP: Hoy Register, MD   Assessment & Plan: Visit Diagnoses:  1. Morbid obesity (HCC)   2. Bilateral primary osteoarthritis of knee     Plan: Bilateral knee osteoarthritis predominate in the medial compartments.  More change in the right than the left knee.  No acute changes.  Will inject with cortisone and have her return in 2 weeks to monitor response and consider injecting the opposite knee  Follow-Up Instructions: Return in about 2 weeks (around 03/13/2020).   Orders:  Orders Placed This Encounter  Procedures  . Large Joint Inj: R knee   No orders of the defined types were placed in this encounter.     Procedures: Large Joint Inj: R knee on 02/28/2020 4:28 PM Indications: pain and diagnostic evaluation Details: 25 G 1.5 in needle, anteromedial approach  Arthrogram: No  Medications: 2 mL lidocaine 1 %; 2 mL bupivacaine 0.5 %; 80 mg methylPREDNISolone acetate 40 MG/ML Procedure, treatment alternatives, risks and benefits explained, specific risks discussed. Consent was given by the patient. Immediately prior to procedure a time out was called to verify the correct patient, procedure, equipment, support staff and site/side marked as required. Patient was prepped and draped in the usual sterile fashion.       Clinical Data: No additional findings.   Subjective: Chief Complaint  Patient presents with  . Right Knee - Pain  Patient presents today for her right knee pain. She said that it has bothered her for years, but recently worsened after a fall two weeks ago. She said that she went to an urgent care on 02/10/2020 and had x-rays of both knees. She said that her pain is worse with any prolonged walking or upon getting up. She said that it  grinds with any movement and swells. She has an area behind her knee that swells and feels like a knot. She said that her knee feels like a cramp most of the time. She has tried multiple over the counter medicines to try and get some relief.   HPI  Review of Systems   Objective: Vital Signs: Ht 5\' 3"  (1.6 m)   Wt (!) 321 lb (145.6 kg)   BMI 56.86 kg/m   Physical Exam Constitutional:      Appearance: She is well-developed.  Eyes:     Pupils: Pupils are equal, round, and reactive to light.  Pulmonary:     Effort: Pulmonary effort is normal.  Skin:    General: Skin is warm and dry.  Neurological:     Mental Status: She is alert and oriented to person, place, and time.  Psychiatric:        Behavior: Behavior normal.     Ortho Exam awake alert and oriented x3.  Comfortable sitting.  BMI is 56.  Large thighs.  Does have bilateral patellar crepitation but no pain with compression.  No instability.  Predominately medial joint pain more so on the right than the left.  Does have a popliteal prominence along the lateral popliteal area bilaterally.  Not sure if this is just a fatty deposit or possibly Baker's cyst.  No calf pain.  Motor and sensory exam intact.  Painless range of motion both hips.  Full extension.  No effusion.  Flexed about 95 to 100 degrees  Specialty Comments:  No specialty comments available.  Imaging: No results found.   PMFS History: Patient Active Problem List   Diagnosis Date Noted  . Bilateral primary osteoarthritis of knee 02/28/2020  . Precordial chest pain 05/03/2019  . Educated about COVID-19 virus infection 05/03/2019  . Hoarseness of voice 03/17/2018  . Gastroesophageal reflux disease without esophagitis 03/17/2018  . Supervision of normal pregnancy, antepartum 12/15/2017  . Acute medial meniscus tear of right knee 02/10/2017  . Vitamin D deficiency 07/10/2015  . Anxiety 06/20/2015  . Costochondritis 05/18/2015  . Primary osteoarthritis of right  knee 04/22/2014  . Essential hypertension 07/18/2012  . LUMBOSACRAL STRAIN, ACUTE 07/05/2008  . Morbid obesity (HCC) 02/24/2008  . Asthma with exacerbation 02/24/2008   Past Medical History:  Diagnosis Date  . Asthma    2010-only bothers pt during bad cold  . Depression    2010-had when mother passed away  . History of gonorrhea   . Hypertension    2004  . Obesity   . Pre-diabetes   . Preeclampsia complicating hypertension 01/21/2015    Family History  Problem Relation Age of Onset  . Hypertension Mother   . Diabetes Mother   . Cancer Mother        Breast   . Heart attack Mother 44  . Hypertension Father   . Diabetes Father   . Hyperlipidemia Father   . Arthritis Other   . Asthma Other   . Breast cancer Other   . Heart failure Other   . Congenital heart disease Other   . Depression Other   . Heart attack Other     Past Surgical History:  Procedure Laterality Date  . CHOLECYSTECTOMY    . DILATION AND CURETTAGE OF UTERUS    . WISDOM TOOTH EXTRACTION     Social History   Occupational History  . Not on file  Tobacco Use  . Smoking status: Former Smoker    Types: Cigarettes    Quit date: 10/31/2010    Years since quitting: 9.3  . Smokeless tobacco: Never Used  Vaping Use  . Vaping Use: Never used  Substance and Sexual Activity  . Alcohol use: No    Alcohol/week: 0.0 standard drinks  . Drug use: No  . Sexual activity: Not Currently    Partners: Male    Birth control/protection: Injection

## 2020-03-06 ENCOUNTER — Ambulatory Visit (HOSPITAL_COMMUNITY)
Admission: EM | Admit: 2020-03-06 | Discharge: 2020-03-06 | Disposition: A | Discharge status: Home / Self Care | Payer: Medicaid Other | Attending: Family Medicine | Admitting: Family Medicine

## 2020-03-06 ENCOUNTER — Other Ambulatory Visit: Payer: Self-pay

## 2020-03-06 ENCOUNTER — Ambulatory Visit (HOSPITAL_COMMUNITY)
Admission: EM | Admit: 2020-03-06 | Discharge: 2020-03-06 | Discharge status: Against Medical Advice | Payer: Medicaid Other

## 2020-03-06 ENCOUNTER — Encounter (HOSPITAL_COMMUNITY): Payer: Self-pay | Admitting: *Deleted

## 2020-03-06 ENCOUNTER — Other Ambulatory Visit: Payer: Self-pay | Admitting: Family Medicine

## 2020-03-06 DIAGNOSIS — Z888 Allergy status to other drugs, medicaments and biological substances status: Secondary | ICD-10-CM | POA: Diagnosis not present

## 2020-03-06 DIAGNOSIS — J3089 Other allergic rhinitis: Secondary | ICD-10-CM

## 2020-03-06 DIAGNOSIS — Z20822 Contact with and (suspected) exposure to covid-19: Secondary | ICD-10-CM | POA: Insufficient documentation

## 2020-03-06 DIAGNOSIS — Z79899 Other long term (current) drug therapy: Secondary | ICD-10-CM | POA: Diagnosis not present

## 2020-03-06 DIAGNOSIS — Z7984 Long term (current) use of oral hypoglycemic drugs: Secondary | ICD-10-CM | POA: Insufficient documentation

## 2020-03-06 DIAGNOSIS — J069 Acute upper respiratory infection, unspecified: Secondary | ICD-10-CM

## 2020-03-06 DIAGNOSIS — Z87891 Personal history of nicotine dependence: Secondary | ICD-10-CM | POA: Diagnosis not present

## 2020-03-06 DIAGNOSIS — H9209 Otalgia, unspecified ear: Secondary | ICD-10-CM | POA: Diagnosis present

## 2020-03-06 DIAGNOSIS — R197 Diarrhea, unspecified: Secondary | ICD-10-CM | POA: Diagnosis not present

## 2020-03-06 DIAGNOSIS — H6983 Other specified disorders of Eustachian tube, bilateral: Secondary | ICD-10-CM | POA: Diagnosis not present

## 2020-03-06 DIAGNOSIS — H6993 Unspecified Eustachian tube disorder, bilateral: Secondary | ICD-10-CM

## 2020-03-06 MED ORDER — PREDNISONE 50 MG PO TABS: ORAL_TABLET | ORAL | 0 refills | Status: DC

## 2020-03-06 MED ORDER — FLUTICASONE PROPIONATE 50 MCG/ACT NA SUSP: 1.0000 | Freq: Two times a day (BID) | NASAL | 1 refills | Status: DC

## 2020-03-06 NOTE — ED Triage Notes (Signed)
Pt has had Bil ear pain ,facial pain and diarrhea.

## 2020-03-07 LAB — SARS CORONAVIRUS 2 (TAT 6-24 HRS): SARS Coronavirus 2: NEGATIVE

## 2020-03-07 NOTE — ED Provider Notes (Signed)
[facility]    CSN: [id number] Arrival date & time: 03/06/20  1930      History   Chief Complaint Chief Complaint  Patient presents with  . Otalgia  . Diarrhea    HPI Crystal Burns is a 39 y.o. female.   Here today with 1 day of b/l ear pain, pressure and popping, sinus pain and pressure, congestion, post nasal drip, and diarrhea. Denies fever, chills, CP, SOB. So far trying her typical allergy regimen without benefit. No new sick contacts, recent travel. UTD on vaccines. Known hx of significant seasonal allergies, asthma.      Past Medical History:  Diagnosis Date  . Asthma    2010-only bothers pt during bad cold  . Depression    2010-had when mother passed away  . History of gonorrhea   . Hypertension    2004  . Obesity   . Pre-diabetes   . Preeclampsia complicating hypertension 01/21/2015    Patient Active Problem List   Diagnosis Date Noted  . Bilateral primary osteoarthritis of knee 02/28/2020  . Precordial chest pain 05/03/2019  . Educated about COVID-19 virus infection 05/03/2019  . Hoarseness of voice 03/17/2018  . Gastroesophageal reflux disease without esophagitis 03/17/2018  . Supervision of normal pregnancy, antepartum 12/15/2017  . Acute medial meniscus tear of right knee 02/10/2017  . Vitamin D deficiency 07/10/2015  . Anxiety 06/20/2015  . Costochondritis 05/18/2015  . Primary osteoarthritis of right knee 04/22/2014  . Essential hypertension 07/18/2012  . LUMBOSACRAL STRAIN, ACUTE 07/05/2008  . Morbid obesity (HCC) 02/24/2008  . Asthma with exacerbation 02/24/2008    Past Surgical History:  Procedure Laterality Date  . CHOLECYSTECTOMY    . DILATION AND CURETTAGE OF UTERUS    . WISDOM TOOTH EXTRACTION      OB History    Gravida  11   Para  7   Term  7   Preterm  0   AB  4   Living  6     SAB  4   TAB  0   Ectopic  0   Multiple  0   Live Births  7            Home Medications    Prior to  Admission medications   Medication Sig Start Date End Date Taking? Authorizing Provider  albuterol (PROVENTIL HFA;VENTOLIN HFA) 108 (90 Base) MCG/ACT inhaler Inhale 2 puffs into the lungs every 4 (four) hours as needed for wheezing or shortness of breath (cough, shortness of breath or wheezing.). 03/17/18  Yes Storm Frisk, MD  albuterol (PROVENTIL) (2.5 MG/3ML) 0.083% nebulizer solution Take 3 mLs (2.5 mg total) by nebulization every 6 (six) hours as needed for wheezing or shortness of breath. 08/07/17  Yes Elvina Sidle, MD  hydrochlorothiazide (HYDRODIURIL) 25 MG tablet Take 1 tablet (25 mg total) by mouth daily. 04/22/19  Yes McClung, Angela M, PA-C  losartan (COZAAR) 50 MG tablet TAKE 1 TABLET (50 MG TOTAL) BY MOUTH DAILY. 01/20/20 04/19/20 Yes McClung, Marzella Schlein, PA-C  metFORMIN (GLUCOPHAGE) 500 MG tablet Take 2 tablets (1,000 mg total) by mouth 2 (two) times daily with a meal. 10/21/19  Yes McClung, Angela M, PA-C  montelukast (SINGULAIR) 10 MG tablet Take 1 tablet (10 mg total) by mouth at bedtime. 12/23/19  Yes Particia Nearing, PA-C  azelastine (ASTELIN) 0.1 % nasal spray Place 2 sprays into both nostrils 2 (two) times daily. Use in each nostril as directed 12/10/19   Fulp, Cammie,  MD  cetirizine (ZYRTEC ALLERGY) 10 MG tablet Take 1 tablet (10 mg total) by mouth daily. In the evening for nasal congestion 12/10/19   Fulp, Cammie, MD  fluticasone (FLONASE) 50 MCG/ACT nasal spray Place 1 spray into both nostrils 2 (two) times daily. 03/06/20   Particia Nearing, PA-C  loperamide (IMODIUM) 2 MG capsule Take 1 capsule (2 mg total) by mouth 4 (four) times daily as needed for diarrhea or loose stools. 06/21/18   Eustace Moore, MD  omeprazole (PRILOSEC) 20 MG capsule Take 1 capsule (20 mg total) by mouth daily. 03/17/18   Storm Frisk, MD  polyethylene glycol [facility]) packet Take one capful in large glass several times a day until you go to the bathroom, then once a day 05/20/18    Elvina Sidle, MD  predniSONE (DELTASONE) 50 MG tablet Take 1 tab daily for 3 days 03/06/20   Particia Nearing, PA-C    Family History Family History  Problem Relation Age of Onset  . Hypertension Mother   . Diabetes Mother   . Cancer Mother        Breast   . Heart attack Mother 71  . Hypertension Father   . Diabetes Father   . Hyperlipidemia Father   . Arthritis Other   . Asthma Other   . Breast cancer Other   . Heart failure Other   . Congenital heart disease Other   . Depression Other   . Heart attack Other     Social History Social History   Tobacco Use  . Smoking status: Former Smoker    Types: Cigarettes    Quit date: 10/31/2010    Years since quitting: 9.3  . Smokeless tobacco: Never Used  Vaping Use  . Vaping Use: Never used  Substance Use Topics  . Alcohol use: No    Alcohol/week: 0.0 standard drinks  . Drug use: No     Allergies   Bee venom, Lisinopril, and Shellfish allergy   Review of Systems Review of Systems PER HPI    Physical Exam Triage Vital Signs ED Triage Vitals  Enc Vitals Group     BP 03/06/20 1950 122/75     Pulse Rate 03/06/20 1950 67     Resp 03/06/20 1950 16     Temp 03/06/20 1950 97.9 F (36.6 C)     Temp Source 03/06/20 1950 Oral     SpO2 03/06/20 1950 99 %     Weight 03/06/20 1955 (!) 320 lb (145.2 kg)     Height 03/06/20 1955 5\' 3"  (1.6 m)     Head Circumference --      Peak Flow --      Pain Score 03/06/20 1954 8     Pain Loc --      Pain Edu? --      Excl. in GC? --    No data found.  Updated Vital Signs BP 122/75 (BP Location: Right Arm)   Pulse 67   Temp 97.9 F (36.6 C) (Oral)   Resp 16   Ht 5\' 3"  (1.6 m)   Wt (!) 320 lb (145.2 kg)   LMP  (LMP Unknown)   SpO2 99%   BMI 56.69 kg/m   Visual Acuity Right Eye Distance:   Left Eye Distance:   Bilateral Distance:    Right Eye Near:   Left Eye Near:    Bilateral Near:     Physical Exam Vitals and nursing note reviewed.    Constitutional:  Appearance: Normal appearance. She is not ill-appearing.  HENT:     Head: Atraumatic.     Ears:     Comments: B/l middle ear effusion, mild injection of TMs but no erythema, purulence on exam. TMs intact b/l    Nose: Rhinorrhea present.     Comments: Nasal turbinates boggy and erythematous b/l    Mouth/Throat:     Mouth: Mucous membranes are moist.     Pharynx: Posterior oropharyngeal erythema present.  Eyes:     Extraocular Movements: Extraocular movements intact.     Conjunctiva/sclera: Conjunctivae normal.  Cardiovascular:     Rate and Rhythm: Normal rate and regular rhythm.     Heart sounds: Normal heart sounds.  Pulmonary:     Effort: Pulmonary effort is normal.     Breath sounds: Normal breath sounds. No wheezing or rales.  Abdominal:     General: Bowel sounds are normal. There is no distension.     Palpations: Abdomen is soft.     Tenderness: There is no abdominal tenderness. There is no guarding.  Musculoskeletal:        General: Normal range of motion.     Cervical back: Normal range of motion and neck supple.  Skin:    General: Skin is warm and dry.  Neurological:     Mental Status: She is alert and oriented to person, place, and time.  Psychiatric:        Mood and Affect: Mood normal.        Thought Content: Thought content normal.        Judgment: Judgment normal.     UC Treatments / Results  Labs (all labs ordered are listed, but only abnormal results are displayed) Labs Reviewed  SARS CORONAVIRUS 2 (TAT 6-24 HRS)    EKG   Radiology No results found.  Procedures Procedures (including critical care time)  Medications Ordered in UC Medications - No data to display  Initial Impression / Assessment and Plan / UC Course  I have reviewed the triage vital signs and the nursing notes.  Pertinent labs & imaging results that were available during my care of the patient were reviewed by me and considered in my medical decision  making (see chart for details).     Will tx with prednisone burst, flonase in addition to her allergy regimen. Continue OTC supportive medications and home care. COVID pcr pending, work note given. Return if worsening or not resolving.   Final Clinical Impressions(s) / UC Diagnoses   Final diagnoses:  Viral URI with cough  Eustachian tube dysfunction, bilateral  Non-seasonal allergic rhinitis, unspecified trigger   Discharge Instructions   None    ED Prescriptions    Medication Sig Dispense Auth. Provider   predniSONE (DELTASONE) 50 MG tablet Take 1 tab daily for 3 days 5 tablet Particia Nearing, PA-C   fluticasone [facility]) 50 MCG/ACT nasal spray Place 1 spray into both nostrils 2 (two) times daily. 16 g Particia Nearing, [place]     PDMP not reviewed this encounter.   Particia Nearing, [place] 03/07/20 1131

## 2020-03-09 MED FILL — HYDROCHLOROTHIAZIDE 25 MG T: 25 | 30 days supply | Qty: 30 | Fill #4

## 2020-03-14 ENCOUNTER — Encounter: Payer: Self-pay | Admitting: Orthopaedic Surgery

## 2020-03-14 ENCOUNTER — Other Ambulatory Visit: Payer: Self-pay

## 2020-03-14 ENCOUNTER — Ambulatory Visit (INDEPENDENT_AMBULATORY_CARE_PROVIDER_SITE_OTHER): Payer: Medicaid Other | Admitting: Orthopaedic Surgery

## 2020-03-14 DIAGNOSIS — M170 Bilateral primary osteoarthritis of knee: Secondary | ICD-10-CM

## 2020-03-14 MED ORDER — LIDOCAINE HCL 1 % IJ SOLN
2.0000 mL | INTRAMUSCULAR | Status: AC | PRN
  Administered 2020-03-14: 2 mL

## 2020-03-14 MED ORDER — METHYLPREDNISOLONE ACETATE 40 MG/ML IJ SUSP
80.0000 mg | INTRAMUSCULAR | Status: AC | PRN
  Administered 2020-03-14: 80 mg via INTRA_ARTICULAR

## 2020-03-14 MED ORDER — BUPIVACAINE HCL 0.5 % IJ SOLN
2.0000 mL | INTRAMUSCULAR | Status: AC | PRN
  Administered 2020-03-14: 2 mL via INTRA_ARTICULAR

## 2020-03-14 NOTE — Progress Notes (Signed)
Office Visit Note   Patient: Crystal Burns           Date of Birth: 1980-08-17           MRN: [id number] Visit Date: 03/14/2020              Requested by: Hoy Register, MD [address] [place],  [place] [address] PCP: Hoy Register, MD   Assessment & Plan: Visit Diagnoses:  1. Morbid obesity (HCC)   2. Bilateral primary osteoarthritis of knee     Plan: Bilateral advanced osteoarthritis of the knees.  She has had some response to cortisone injection her right knee.  I will inject the left knee today.  Also had a long discussion regarding her obesity with a BMI of over 56.  She would need to lose about 100 pounds to decrease the BMI to about 39.  We will  refer her to the bariatric clinic.  Also had a long discussion regarding use of NSAIDs and topical gels such as Voltaren for her knee.  She is aware of the severity of her arthritis and potential treatment options including knee replacement at some point.  She presently is not a candidate  Follow-Up Instructions: Return if symptoms worsen or fail to improve.   Orders:  Orders Placed This Encounter  Procedures  . Large Joint Inj: L knee  . Amb Ref to Medical Weight Management   No orders of the defined types were placed in this encounter.     Procedures: Large Joint Inj: L knee on 03/14/2020 2:16 PM Indications: pain and diagnostic evaluation Details: 25 G 1.5 in needle, anteromedial approach  Arthrogram: No  Medications: 2 mL lidocaine 1 %; 2 mL bupivacaine 0.5 %; 80 mg methylPREDNISolone acetate 40 MG/ML Procedure, treatment alternatives, risks and benefits explained, specific risks discussed. Consent was given by the patient. Patient was prepped and draped in the usual sterile fashion.       Clinical Data: No additional findings.   Subjective: Chief Complaint  Patient presents with  . Left Knee - Follow-up  . Right Knee - Follow-up  Patient presents today for a two week follow up on her knees.  She received a  Right knee cortisone injection on 02/28/2020. She said that the injection took about a week to work and has been doing better. Today she states that her right knee is sore with the colder weather. She said that her left knee is not bothering her much today, but she still wants to discuss injecting it with cortisone as well.   HPI  Review of Systems   Objective: Vital Signs: Ht 5\' 3"  (1.6 m)   Wt (!) 320 lb (145.2 kg)   LMP  (LMP Unknown)   BMI 56.69 kg/m   Physical Exam Constitutional:      Appearance: She is well-developed.  Eyes:     Pupils: Pupils are equal, round, and reactive to light.  Pulmonary:     Effort: Pulmonary effort is normal.  Skin:    General: Skin is warm and dry.  Neurological:     Mental Status: She is alert and oriented to person, place, and time.  Psychiatric:        Behavior: Behavior normal.     Ortho Exam awake alert and oriented x3.  Comfortable sitting in no acute distress.  Large knees.  Mostly medial joint pain.  Slight varus with weightbearing.  No effusion either knee.  Some patella crepitation but really no pain  with patella compression.  Predominately medial joint pain with full extension about 95 degrees of flexion.  No instability.  Motor exam intact  Specialty Comments:  No specialty comments available.  Imaging: No results found.   PMFS History: Patient Active Problem List   Diagnosis Date Noted  . Bilateral primary osteoarthritis of knee 02/28/2020  . Precordial chest pain 05/03/2019  . Educated about COVID-19 virus infection 05/03/2019  . Hoarseness of voice 03/17/2018  . Gastroesophageal reflux disease without esophagitis 03/17/2018  . Supervision of normal pregnancy, antepartum 12/15/2017  . Acute medial meniscus tear of right knee 02/10/2017  . Vitamin D deficiency 07/10/2015  . Anxiety 06/20/2015  . Costochondritis 05/18/2015  . Primary osteoarthritis of right knee 04/22/2014  . Essential hypertension  07/18/2012  . LUMBOSACRAL STRAIN, ACUTE 07/05/2008  . Morbid obesity (HCC) 02/24/2008  . Asthma with exacerbation 02/24/2008   Past Medical History:  Diagnosis Date  . Asthma    2010-only bothers pt during bad cold  . Depression    2010-had when mother passed away  . History of gonorrhea   . Hypertension    2004  . Obesity   . Pre-diabetes   . Preeclampsia complicating hypertension 01/21/2015    Family History  Problem Relation Age of Onset  . Hypertension Mother   . Diabetes Mother   . Cancer Mother        Breast   . Heart attack Mother 62  . Hypertension Father   . Diabetes Father   . Hyperlipidemia Father   . Arthritis Other   . Asthma Other   . Breast cancer Other   . Heart failure Other   . Congenital heart disease Other   . Depression Other   . Heart attack Other     Past Surgical History:  Procedure Laterality Date  . CHOLECYSTECTOMY    . DILATION AND CURETTAGE OF UTERUS    . WISDOM TOOTH EXTRACTION     Social History   Occupational History  . Not on file  Tobacco Use  . Smoking status: Former Smoker    Types: Cigarettes    Quit date: 10/31/2010    Years since quitting: 9.3  . Smokeless tobacco: Never Used  Vaping Use  . Vaping Use: Never used  Substance and Sexual Activity  . Alcohol use: No    Alcohol/week: 0.0 standard drinks  . Drug use: No  . Sexual activity: Not Currently    Partners: Male    Birth control/protection: Injection

## 2020-03-28 ENCOUNTER — Ambulatory Visit: Payer: Medicaid Other | Admitting: Orthopaedic Surgery

## 2020-04-14 ENCOUNTER — Other Ambulatory Visit: Payer: Self-pay | Admitting: Physician Assistant

## 2020-04-14 ENCOUNTER — Other Ambulatory Visit: Payer: Self-pay | Admitting: Family Medicine

## 2020-04-14 DIAGNOSIS — I10 Essential (primary) hypertension: Secondary | ICD-10-CM

## 2020-04-14 MED FILL — HYDROCHLOROTHIAZIDE 25 MG T: 25 | 30 days supply | Qty: 30 | Fill #0

## 2020-04-14 MED FILL — LOSARTAN POTASSIUM 50 MG TA: 50 | 30 days supply | Qty: 30 | Fill #0

## 2020-05-16 ENCOUNTER — Encounter: Payer: Medicaid Other | Admitting: Family Medicine

## 2020-05-19 ENCOUNTER — Other Ambulatory Visit: Payer: Self-pay | Admitting: Family Medicine

## 2020-05-19 ENCOUNTER — Other Ambulatory Visit: Payer: Self-pay | Admitting: Physician Assistant

## 2020-05-19 DIAGNOSIS — E1165 Type 2 diabetes mellitus with hyperglycemia: Secondary | ICD-10-CM

## 2020-05-19 DIAGNOSIS — I10 Essential (primary) hypertension: Secondary | ICD-10-CM

## 2020-05-19 MED ORDER — METFORMIN HCL 500 MG PO TABS: 1000.0000 mg | ORAL_TABLET | Freq: Two times a day (BID) | ORAL | 0 refills | Status: DC

## 2020-05-19 MED FILL — METFORMIN HCL 500 MG TABS: 500 | 30 days supply | Qty: 120 | Fill #0

## 2020-05-19 MED FILL — HYDROCHLOROTHIAZIDE 25 MG T: 25 | 30 days supply | Qty: 30 | Fill #0

## 2020-05-19 MED FILL — LOSARTAN POTASSIUM 50 MG TA: 50 | 30 days supply | Qty: 30 | Fill #0

## 2020-05-19 NOTE — Telephone Encounter (Signed)
Requested Prescriptions  Pending Prescriptions Disp Refills  . metFORMIN (GLUCOPHAGE) 500 MG tablet 120 tablet 0    Sig: Take 2 tablets (1,000 mg total) by mouth 2 (two) times daily with a meal.     Endocrinology:  Diabetes - Biguanides Failed - 05/19/2020  9:20 AM      Failed - Cr in normal range and within 360 days    Creat  Date Value Ref Range Status  04/22/2014 0.59 0.50 - 1.10 mg/dL Final   Creatinine, Ser  Date Value Ref Range Status  04/22/2019 0.62 0.57 - 1.00 mg/dL Final   Creatinine, Urine  Date Value Ref Range Status  02/06/2015 41.00 mg/dL Final         Failed - HBA1C is between 0 and 7.9 and within 180 days    Hgb A1c MFr Bld  Date Value Ref Range Status  04/22/2019 6.3 (H) 4.8 - 5.6 % Final    Comment:             Prediabetes: 5.7 - 6.4          Diabetes: >6.4          Glycemic control for adults with diabetes: <7.0          Failed - AA eGFR in normal range and within 360 days    GFR, Est African American  Date Value Ref Range Status  04/22/2014 >89 mL/min Final   GFR calc Af Amer  Date Value Ref Range Status  04/22/2019 132 >59 mL/min/1.73 Final   GFR, Est Non African American  Date Value Ref Range Status  04/22/2014 >89 mL/min Final    Comment:      The estimated GFR is a calculation valid for adults (>=62 years old) that uses the CKD-EPI algorithm to adjust for age and sex. It is   not to be used for children, pregnant women, hospitalized patients,    patients on dialysis, or with rapidly changing kidney function. According to the NKDEP, eGFR >89 is normal, 60-89 shows mild impairment, 30-59 shows moderate impairment, 15-29 shows severe impairment and <15 is ESRD.      GFR calc non Af Amer  Date Value Ref Range Status  04/22/2019 115 >59 mL/min/1.73 Final         Failed - Valid encounter within last 6 months    Recent Outpatient Visits          5 months ago Right ear pain   [place], [place]    9 months ago Upper respiratory tract infection, unspecified type   [place], [place], [place]   9 months ago No-show for appointment   [place], [place], [place]   11 months ago Dysfunction of right eustachian tube   [place], Charlane Ferretti, MD   1 year ago Acute suppurative otitis media of right ear without spontaneous rupture of tympanic membrane, recurrence not specified   [place], [place], NP      Future Appointments            In 1 month Charlott Rakes, MD [place]

## 2020-05-19 NOTE — Telephone Encounter (Signed)
Medication Refill - Medication: Metformin  Has the patient contacted their pharmacy? Yes.   (Agent: If no, request that the patient contact the pharmacy for the refill.) (Agent: If yes, when and what did the pharmacy advise?) Patient was told by the pharmacy to contact her provider for refills.  Preferred Pharmacy (with phone number or street name): [facility] - [place], [place] - [address]  Agent: Please be advised that RX refills may take up to 3 business days. We ask that you follow-up with your pharmacy.

## 2020-05-22 ENCOUNTER — Other Ambulatory Visit: Payer: Self-pay

## 2020-05-22 ENCOUNTER — Ambulatory Visit (HOSPITAL_COMMUNITY)
Admission: EM | Admit: 2020-05-22 | Discharge: 2020-05-22 | Disposition: A | Discharge status: Home / Self Care | Payer: Medicaid Other | Attending: Urgent Care | Admitting: Urgent Care

## 2020-05-22 ENCOUNTER — Encounter (HOSPITAL_COMMUNITY): Payer: Self-pay | Admitting: Emergency Medicine

## 2020-05-22 DIAGNOSIS — Z888 Allergy status to other drugs, medicaments and biological substances status: Secondary | ICD-10-CM | POA: Diagnosis not present

## 2020-05-22 DIAGNOSIS — J45909 Unspecified asthma, uncomplicated: Secondary | ICD-10-CM | POA: Insufficient documentation

## 2020-05-22 DIAGNOSIS — Z760 Encounter for issue of repeat prescription: Secondary | ICD-10-CM | POA: Diagnosis not present

## 2020-05-22 DIAGNOSIS — H9203 Otalgia, bilateral: Secondary | ICD-10-CM | POA: Diagnosis not present

## 2020-05-22 DIAGNOSIS — R5383 Other fatigue: Secondary | ICD-10-CM | POA: Insufficient documentation

## 2020-05-22 DIAGNOSIS — J029 Acute pharyngitis, unspecified: Secondary | ICD-10-CM | POA: Insufficient documentation

## 2020-05-22 DIAGNOSIS — Z7984 Long term (current) use of oral hypoglycemic drugs: Secondary | ICD-10-CM | POA: Insufficient documentation

## 2020-05-22 DIAGNOSIS — Z87891 Personal history of nicotine dependence: Secondary | ICD-10-CM | POA: Insufficient documentation

## 2020-05-22 DIAGNOSIS — Z79899 Other long term (current) drug therapy: Secondary | ICD-10-CM | POA: Insufficient documentation

## 2020-05-22 DIAGNOSIS — R0982 Postnasal drip: Secondary | ICD-10-CM | POA: Insufficient documentation

## 2020-05-22 DIAGNOSIS — J069 Acute upper respiratory infection, unspecified: Secondary | ICD-10-CM | POA: Diagnosis not present

## 2020-05-22 DIAGNOSIS — Z20822 Contact with and (suspected) exposure to covid-19: Secondary | ICD-10-CM | POA: Diagnosis not present

## 2020-05-22 LAB — POCT RAPID STREP A, ED / UC: Streptococcus, Group A Screen (Direct): NEGATIVE

## 2020-05-22 MED ORDER — PROMETHAZINE-DM 6.25-15 MG/5ML PO SYRP: 5.0000 mL | ORAL_SOLUTION | Freq: Every evening | ORAL | 0 refills | Status: DC | PRN

## 2020-05-22 MED ORDER — BENZONATATE 100 MG PO CAPS: 100.0000 mg | ORAL_CAPSULE | Freq: Three times a day (TID) | ORAL | 0 refills | Status: DC | PRN

## 2020-05-22 MED ORDER — ALBUTEROL SULFATE (2.5 MG/3ML) 0.083% IN NEBU: 2.5000 mg | INHALATION_SOLUTION | Freq: Four times a day (QID) | RESPIRATORY_TRACT | 0 refills | Status: DC | PRN

## 2020-05-22 MED ORDER — PSEUDOEPHEDRINE HCL 30 MG PO TABS: 30.0000 mg | ORAL_TABLET | Freq: Three times a day (TID) | ORAL | 0 refills | Status: DC | PRN

## 2020-05-22 MED ORDER — ALBUTEROL SULFATE HFA 108 (90 BASE) MCG/ACT IN AERS: 2.0000 | INHALATION_SPRAY | RESPIRATORY_TRACT | 0 refills | Status: DC | PRN

## 2020-05-22 NOTE — ED Provider Notes (Signed)
[facility]   MRN: [id number] DOB: 01-13-1981  Subjective:   Crystal Burns is a 40 y.o. female presenting for 2-day history of acute onset sinus congestion, postnasal drainage and throat pain, coughing, fatigue, bilateral ear pain.  Patient is COVID vaccinated.  Has not had her booster shot.  She also has a history of asthma.  Needs refills on her inhalers and nebulized albuterol.  Denies chest pain, shortness of breath, fever, body aches.  Patient is not a smoker.  No sick contacts to her knowledge.  No current facility-administered medications for this encounter.  Current Outpatient Medications:  .  albuterol (PROVENTIL HFA;VENTOLIN HFA) 108 (90 Base) MCG/ACT inhaler, Inhale 2 puffs into the lungs every 4 (four) hours as needed for wheezing or shortness of breath (cough, shortness of breath or wheezing.)., Disp: 1 Inhaler, Rfl: 3 .  albuterol (PROVENTIL) (2.5 MG/3ML) 0.083% nebulizer solution, Take 3 mLs (2.5 mg total) by nebulization every 6 (six) hours as needed for wheezing or shortness of breath., Disp: 75 mL, Rfl: 12 .  azelastine (ASTELIN) 0.1 % nasal spray, Place 2 sprays into both nostrils 2 (two) times daily. Use in each nostril as directed, Disp: 30 mL, Rfl: 2 .  cetirizine (ZYRTEC ALLERGY) 10 MG tablet, Take 1 tablet (10 mg total) by mouth daily. In the evening for nasal congestion, Disp: 30 tablet, Rfl: 3 .  fluticasone (FLONASE) 50 MCG/ACT nasal spray, Place 1 spray into both nostrils 2 (two) times daily., Disp: 16 g, Rfl: 1 .  hydrochlorothiazide (HYDRODIURIL) 25 MG tablet, TAKE 1 TABLET (25 MG TOTAL) BY MOUTH DAILY., Disp: 30 tablet, Rfl: 0 .  loperamide (IMODIUM) 2 MG capsule, Take 1 capsule (2 mg total) by mouth 4 (four) times daily as needed for diarrhea or loose stools., Disp: 12 capsule, Rfl: 0 .  losartan (COZAAR) 50 MG tablet, TAKE 1 TABLET (50 MG TOTAL) BY MOUTH DAILY., Disp: 30 tablet, Rfl: 0 .  metFORMIN (GLUCOPHAGE) 500 MG tablet, Take 2  tablets (1,000 mg total) by mouth 2 (two) times daily with a meal., Disp: 120 tablet, Rfl: 0 .  montelukast (SINGULAIR) 10 MG tablet, Take 1 tablet (10 mg total) by mouth at bedtime., Disp: 30 tablet, Rfl: 0 .  omeprazole (PRILOSEC) 20 MG capsule, Take 1 capsule (20 mg total) by mouth daily., Disp: 30 capsule, Rfl: 6 .  polyethylene glycol (MIRALAX) packet, Take one capful in large glass several times a day until you go to the bathroom, then once a day, Disp: 14 each, Rfl: 4 .  predniSONE (DELTASONE) 50 MG tablet, Take 1 tab daily for 3 days, Disp: 5 tablet, Rfl: 0   Allergies  Allergen Reactions  . Bee Venom Hives and Swelling  . Lisinopril Shortness Of Breath and Cough  . Shellfish Allergy Anaphylaxis    Past Medical History:  Diagnosis Date  . Asthma    2010-only bothers pt during bad cold  . Depression    2010-had when mother passed away  . History of gonorrhea   . Hypertension    2004  . Obesity   . Pre-diabetes   . Preeclampsia complicating hypertension 01/21/2015     Past Surgical History:  Procedure Laterality Date  . CHOLECYSTECTOMY    . DILATION AND CURETTAGE OF UTERUS    . WISDOM TOOTH EXTRACTION      Family History  Problem Relation Age of Onset  . Hypertension Mother   . Diabetes Mother   . Cancer Mother  Breast   . Heart attack Mother 29  . Hypertension Father   . Diabetes Father   . Hyperlipidemia Father   . Arthritis Other   . Asthma Other   . Breast cancer Other   . Heart failure Other   . Congenital heart disease Other   . Depression Other   . Heart attack Other     Social History   Tobacco Use  . Smoking status: Former Smoker    Types: Cigarettes    Quit date: 10/31/2010    Years since quitting: 9.5  . Smokeless tobacco: Never Used  Vaping Use  . Vaping Use: Never used  Substance Use Topics  . Alcohol use: No    Alcohol/week: 0.0 standard drinks  . Drug use: No    ROS   Objective:   Vitals: BP (!) 144/91 (BP Location:  Right Wrist)   Pulse 98   Temp 98.4 F (36.9 C) (Oral)   Resp 19   SpO2 98%   Physical Exam Constitutional:      General: She is not in acute distress.    Appearance: Normal appearance. She is well-developed. She is obese. She is not ill-appearing, toxic-appearing or diaphoretic.  HENT:     Head: Normocephalic and atraumatic.     Right Ear: Tympanic membrane and ear canal normal. No drainage or tenderness. No middle ear effusion. Tympanic membrane is not erythematous.     Left Ear: Tympanic membrane and ear canal normal. No drainage or tenderness.  No middle ear effusion. Tympanic membrane is not erythematous.     Nose: Nose normal. No congestion or rhinorrhea.     Mouth/Throat:     Mouth: Mucous membranes are moist. No oral lesions.     Pharynx: Oropharynx is clear. No pharyngeal swelling, oropharyngeal exudate, posterior oropharyngeal erythema or uvula swelling.     Tonsils: No tonsillar exudate or tonsillar abscesses.  Eyes:     Extraocular Movements: Extraocular movements intact.     Right eye: Normal extraocular motion.     Left eye: Normal extraocular motion.     Conjunctiva/sclera: Conjunctivae normal.     Pupils: Pupils are equal, round, and reactive to light.  Cardiovascular:     Rate and Rhythm: Normal rate and regular rhythm.     Pulses: Normal pulses.     Heart sounds: Normal heart sounds. No murmur heard. No friction rub. No gallop.   Pulmonary:     Effort: Pulmonary effort is normal. No respiratory distress.     Breath sounds: Normal breath sounds. No stridor. No wheezing, rhonchi or rales.  Musculoskeletal:     Cervical back: Normal range of motion and neck supple.  Lymphadenopathy:     Cervical: No cervical adenopathy.  Skin:    General: Skin is warm and dry.     Findings: No rash.  Neurological:     General: No focal deficit present.     Mental Status: She is alert and oriented to person, place, and time.  Psychiatric:        Mood and Affect: Mood normal.         Behavior: Behavior normal.        Thought Content: Thought content normal.     Results for orders placed or performed during the hospital encounter of 05/22/20 (from the past 24 hour(s))  POCT Rapid Strep A     Status: None   Collection Time: 05/22/20  7:35 PM  Result Value Ref Range   Streptococcus, Group A  Screen (Direct) NEGATIVE NEGATIVE    Assessment and Plan :   PDMP not reviewed this encounter.  1. Viral URI with cough   2. Sore throat     Will manage for viral illness such as common cold, viral URI, viral syndrome, viral rhinitis, COVID-19. Counseled patient on nature of COVID-19 including modes of transmission, diagnostic testing, management and supportive care.  Offered scripts for symptomatic relief. COVID 19 testing is pending. Counseled patient on potential for adverse effects with medications prescribed/recommended today, ER and return-to-clinic precautions discussed, patient verbalized understanding.     Wallis Bamberg, [place] 05/23/20 [phone]

## 2020-05-22 NOTE — Discharge Instructions (Addendum)

## 2020-05-22 NOTE — ED Triage Notes (Signed)
Pt presents with ear pain, fatigue, cough and congestion xs 2 days. States has taken multiple OTC medications with very little relief.   States took tylenol about 2-3 hours ago.

## 2020-05-23 LAB — SARS CORONAVIRUS 2 (TAT 6-24 HRS): SARS Coronavirus 2: NEGATIVE

## 2020-05-25 LAB — CULTURE, GROUP A STREP (THRC)

## 2020-06-02 ENCOUNTER — Encounter (INDEPENDENT_AMBULATORY_CARE_PROVIDER_SITE_OTHER): Payer: Self-pay

## 2020-06-12 ENCOUNTER — Encounter (INDEPENDENT_AMBULATORY_CARE_PROVIDER_SITE_OTHER): Payer: Self-pay

## 2020-06-20 ENCOUNTER — Encounter: Payer: Medicaid Other | Admitting: Family Medicine

## 2020-06-22 ENCOUNTER — Other Ambulatory Visit: Payer: Self-pay | Admitting: Family Medicine

## 2020-06-22 DIAGNOSIS — I10 Essential (primary) hypertension: Secondary | ICD-10-CM

## 2020-06-22 NOTE — Telephone Encounter (Signed)
Notes to clinic: Patient had to move her appt date Review for another courtesy until appt on 07/20/2020   Requested Prescriptions  Pending Prescriptions Disp Refills   losartan (COZAAR) 50 MG tablet [Pharmacy Med Name: LOSARTAN POTASSIUM 50 MG TA 50 Tablet] 30 tablet 0    Sig: TAKE 1 TABLET (50 MG TOTAL) BY MOUTH DAILY.      Cardiovascular:  Angiotensin Receptor Blockers Failed - 06/22/2020  2:22 PM      Failed - Cr in normal range and within 180 days    Creat  Date Value Ref Range Status  04/22/2014 0.59 0.50 - 1.10 mg/dL Final   Creatinine, Ser  Date Value Ref Range Status  04/22/2019 0.62 0.57 - 1.00 mg/dL Final   Creatinine, Urine  Date Value Ref Range Status  02/06/2015 41.00 mg/dL Final          Failed - K in normal range and within 180 days    Potassium  Date Value Ref Range Status  04/22/2019 4.2 3.5 - 5.2 mmol/L Final          Failed - Last BP in normal range    BP Readings from Last 1 Encounters:  05/22/20 (!) 144/91          Failed - Valid encounter within last 6 months    Recent Outpatient Visits           6 months ago Right ear pain   [facility] [place], [place], [place]   10 months ago Upper respiratory tract infection, unspecified type   [facility] [place], [place], [place]   10 months ago No-show for appointment   [facility] [place], [place], [place]   1 year ago Dysfunction of right eustachian tube   [facility] [place], [place], [place]   1 year ago Acute suppurative otitis media of right ear without spontaneous rupture of tympanic membrane, recurrence not specified   [place] [facility] [place], [place], NP       Future Appointments             In 1 month Hoy Register, MD [facility]             Passed - Patient is not pregnant        hydrochlorothiazide (HYDRODIURIL) 25 MG  tablet [Pharmacy Med Name: HYDROCHLOROTHIAZIDE 25 MG T 25 Tablet] 30 tablet 0    Sig: TAKE 1 TABLET (25 MG TOTAL) BY MOUTH DAILY.      Cardiovascular: Diuretics - Thiazide Failed - 06/22/2020  2:22 PM      Failed - Ca in normal range and within 360 days    Calcium  Date Value Ref Range Status  04/22/2019 9.9 8.7 - 10.2 mg/dL Final   Calcium, Ion  Date Value Ref Range Status  05/27/2016 1.28 1.15 - 1.40 mmol/L Final          Failed - Cr in normal range and within 360 days    Creat  Date Value Ref Range Status  04/22/2014 0.59 0.50 - 1.10 mg/dL Final   Creatinine, Ser  Date Value Ref Range Status  04/22/2019 0.62 0.57 - 1.00 mg/dL Final   Creatinine, Urine  Date Value Ref Range Status  02/06/2015 41.00 mg/dL Final          Failed - K in normal range and within 360 days  Potassium  Date Value Ref Range Status  04/22/2019 4.2 3.5 - 5.2 mmol/L Final          Failed - Na in normal range and within 360 days    Sodium  Date Value Ref Range Status  04/22/2019 141 134 - 144 mmol/L Final          Failed - Last BP in normal range    BP Readings from Last 1 Encounters:  05/22/20 (!) 144/91          Failed - Valid encounter within last 6 months    Recent Outpatient Visits           6 months ago Right ear pain   [facility] [place], [place], [place]   10 months ago Upper respiratory tract infection, unspecified type   [facility] [place], [place], [place]   10 months ago No-show for appointment   [facility] [place], [place], [place]   1 year ago Dysfunction of right eustachian tube   [facility] [place], [place], [place]   1 year ago Acute suppurative otitis media of right ear without spontaneous rupture of tympanic membrane, recurrence not specified   [facility] [place], [place], NP       Future Appointments             In 1 month  Hoy Register, MD [facility]

## 2020-06-23 ENCOUNTER — Other Ambulatory Visit: Payer: Self-pay | Admitting: Pharmacist

## 2020-06-23 DIAGNOSIS — I10 Essential (primary) hypertension: Secondary | ICD-10-CM

## 2020-06-23 MED ORDER — LOSARTAN POTASSIUM 50 MG PO TABS: 50.0000 mg | ORAL_TABLET | Freq: Every day | ORAL | 0 refills | Status: DC

## 2020-06-23 MED ORDER — HYDROCHLOROTHIAZIDE 25 MG PO TABS: 25.0000 mg | ORAL_TABLET | Freq: Every day | ORAL | 0 refills | Status: DC

## 2020-07-24 ENCOUNTER — Other Ambulatory Visit: Payer: Self-pay

## 2020-07-24 ENCOUNTER — Other Ambulatory Visit: Payer: Self-pay | Admitting: Family Medicine

## 2020-07-24 DIAGNOSIS — I10 Essential (primary) hypertension: Secondary | ICD-10-CM

## 2020-07-24 DIAGNOSIS — E1165 Type 2 diabetes mellitus with hyperglycemia: Secondary | ICD-10-CM

## 2020-07-24 MED ORDER — HYDROCHLOROTHIAZIDE 25 MG PO TABS
ORAL_TABLET | Freq: Every day | ORAL | 0 refills | Status: DC
Start: 2020-07-24 — End: 2020-07-27
  Filled 2020-07-24: qty 4, 4d supply, fill #0

## 2020-07-24 MED ORDER — METFORMIN HCL 500 MG PO TABS
ORAL_TABLET | Freq: Two times a day (BID) | ORAL | 0 refills | Status: DC
  Filled 2020-07-24: qty 4, 1d supply, fill #0

## 2020-07-24 MED ORDER — LOSARTAN POTASSIUM 50 MG PO TABS
ORAL_TABLET | Freq: Every day | ORAL | 0 refills | Status: DC
Start: 2020-07-24 — End: 2020-07-27
  Filled 2020-07-24: qty 4, 4d supply, fill #0

## 2020-07-24 NOTE — Telephone Encounter (Signed)
Copied from CRM [id number]. Topic: Quick Communication - Rx Refill/Question >> Jul 24, 2020 12:39 PM Izora Ribas, Everette A wrote: Medication: losartan (COZAAR) 50 MG tablet  hydrochlorothiazide (HYDRODIURIL) 25 MG tablet metFORMIN (GLUCOPHAGE) 500 MG tablet  Has the patient contacted their pharmacy? No. Patient has attempted to contact their pharmacy by phone and been unable to successfully reach them  Preferred Pharmacy (with phone number or street name): [facility]  Phone:  [phone] Fax:  [phone]  Agent: Please be advised that RX refills may take up to 3 business days. We ask that you follow-up with your pharmacy.

## 2020-07-24 NOTE — Telephone Encounter (Signed)
Requested Prescriptions  Pending Prescriptions Disp Refills  . losartan (COZAAR) 50 MG tablet 4 tablet 0    Sig: TAKE 1 TABLET (50 MG TOTAL) BY MOUTH DAILY.     Cardiovascular:  Angiotensin Receptor Blockers Failed - 07/24/2020  1:06 PM      Failed - Cr in normal range and within 180 days    Creat  Date Value Ref Range Status  04/22/2014 0.59 0.50 - 1.10 mg/dL Final   Creatinine, Ser  Date Value Ref Range Status  04/22/2019 0.62 0.57 - 1.00 mg/dL Final   Creatinine, Urine  Date Value Ref Range Status  02/06/2015 41.00 mg/dL Final         Failed - K in normal range and within 180 days    Potassium  Date Value Ref Range Status  04/22/2019 4.2 3.5 - 5.2 mmol/L Final         Failed - Last BP in normal range    BP Readings from Last 1 Encounters:  05/22/20 (!) 144/91         Failed - Valid encounter within last 6 months    Recent Outpatient Visits          7 months ago Right ear pain   [place], [place]   11 months ago Upper respiratory tract infection, unspecified type   [place], [place], [place]   11 months ago No-show for appointment   [place], [place], MD   1 year ago Dysfunction of right eustachian tube   [place], [place], MD   1 year ago Acute suppurative otitis media of right ear without spontaneous rupture of tympanic membrane, recurrence not specified   [place], [place], NP      Future Appointments            In 3 days Charlott Rakes, MD [place] - Patient is not pregnant      . hydrochlorothiazide (HYDRODIURIL) 25 MG tablet 4 tablet 0    Sig: TAKE 1 TABLET (25 MG TOTAL) BY MOUTH DAILY.     Cardiovascular: Diuretics - Thiazide Failed - 07/24/2020  1:06 PM      Failed - Ca in normal range and within 360  days    Calcium  Date Value Ref Range Status  04/22/2019 9.9 8.7 - 10.2 mg/dL Final   Calcium, Ion  Date Value Ref Range Status  05/27/2016 1.28 1.15 - 1.40 mmol/L Final         Failed - Cr in normal range and within 360 days    Creat  Date Value Ref Range Status  04/22/2014 0.59 0.50 - 1.10 mg/dL Final   Creatinine, Ser  Date Value Ref Range Status  04/22/2019 0.62 0.57 - 1.00 mg/dL Final   Creatinine, Urine  Date Value Ref Range Status  02/06/2015 41.00 mg/dL Final         Failed - K in normal range and within 360 days    Potassium  Date Value Ref Range Status  04/22/2019 4.2 3.5 - 5.2 mmol/L Final         Failed - Na in normal range and within 360 days    Sodium  Date Value Ref Range Status  04/22/2019 141 134 - 144 mmol/L Final  Failed - Last BP in normal range    BP Readings from Last 1 Encounters:  05/22/20 (!) 144/91         Failed - Valid encounter within last 6 months    Recent Outpatient Visits          7 months ago Right ear pain   [place], MD   11 months ago Upper respiratory tract infection, unspecified type   [place], [place], [place]   11 months ago No-show for appointment   [place] Antony Blackbird, MD   1 year ago Dysfunction of right eustachian tube   [place], Charlane Ferretti, MD   1 year ago Acute suppurative otitis media of right ear without spontaneous rupture of tympanic membrane, recurrence not specified   [place], [place], NP      Future Appointments            In 3 days Charlott Rakes, MD [place]           . metFORMIN (GLUCOPHAGE) 500 MG tablet 4 tablet 0    Sig: TAKE 2 TABLETS (1,000 MG TOTAL) BY MOUTH 2 (TWO) TIMES DAILY WITH A MEAL.     Endocrinology:  Diabetes - Biguanides Failed - 07/24/2020   1:06 PM      Failed - Cr in normal range and within 360 days    Creat  Date Value Ref Range Status  04/22/2014 0.59 0.50 - 1.10 mg/dL Final   Creatinine, Ser  Date Value Ref Range Status  04/22/2019 0.62 0.57 - 1.00 mg/dL Final   Creatinine, Urine  Date Value Ref Range Status  02/06/2015 41.00 mg/dL Final         Failed - HBA1C is between 0 and 7.9 and within 180 days    Hgb A1c MFr Bld  Date Value Ref Range Status  04/22/2019 6.3 (H) 4.8 - 5.6 % Final    Comment:             Prediabetes: 5.7 - 6.4          Diabetes: >6.4          Glycemic control for adults with diabetes: <7.0          Failed - AA eGFR in normal range and within 360 days    GFR, Est African American  Date Value Ref Range Status  04/22/2014 >89 mL/min Final   GFR calc Af Amer  Date Value Ref Range Status  04/22/2019 132 >59 mL/min/1.73 Final   GFR, Est Non African American  Date Value Ref Range Status  04/22/2014 >89 mL/min Final    Comment:      The estimated GFR is a calculation valid for adults (>=55 years old) that uses the CKD-EPI algorithm to adjust for age and sex. It is   not to be used for children, pregnant women, hospitalized patients,    patients on dialysis, or with rapidly changing kidney function. According to the NKDEP, eGFR >89 is normal, 60-89 shows mild impairment, 30-59 shows moderate impairment, 15-29 shows severe impairment and <15 is ESRD.      GFR calc non Af Amer  Date Value Ref Range Status  04/22/2019 115 >59 mL/min/1.73 Final         Failed - Valid encounter within last 6 months  Recent Outpatient Visits          7 months ago Right ear pain   [place], MD   11 months ago Upper respiratory tract infection, unspecified type   [place], [place], [place]   11 months ago No-show for appointment   [place] Antony Blackbird, MD   1 year ago  Dysfunction of right eustachian tube   [place], Charlane Ferretti, MD   1 year ago Acute suppurative otitis media of right ear without spontaneous rupture of tympanic membrane, recurrence not specified   [place], [place], NP      Future Appointments            In 3 days Charlott Rakes, MD [place]

## 2020-07-27 ENCOUNTER — Other Ambulatory Visit: Payer: Self-pay

## 2020-07-27 ENCOUNTER — Ambulatory Visit: Payer: Medicaid Other | Attending: Family Medicine | Admitting: Family Medicine

## 2020-07-27 ENCOUNTER — Other Ambulatory Visit (HOSPITAL_COMMUNITY)
Admission: RE | Admit: 2020-07-27 | Discharge: 2020-07-27 | Disposition: A | Discharge status: Home / Self Care | Payer: Medicaid Other | Source: Ambulatory Visit | Attending: Family Medicine | Admitting: Family Medicine

## 2020-07-27 ENCOUNTER — Encounter: Payer: Self-pay | Admitting: Family Medicine

## 2020-07-27 VITALS — BP 111/74 | HR 73 | Resp 17 | Ht 63.0 in | Wt 321.8 lb

## 2020-07-27 DIAGNOSIS — Z3042 Encounter for surveillance of injectable contraceptive: Secondary | ICD-10-CM | POA: Diagnosis not present

## 2020-07-27 DIAGNOSIS — I10 Essential (primary) hypertension: Secondary | ICD-10-CM

## 2020-07-27 DIAGNOSIS — H538 Other visual disturbances: Secondary | ICD-10-CM | POA: Diagnosis not present

## 2020-07-27 DIAGNOSIS — Z0000 Encounter for general adult medical examination without abnormal findings: Secondary | ICD-10-CM

## 2020-07-27 DIAGNOSIS — Z803 Family history of malignant neoplasm of breast: Secondary | ICD-10-CM

## 2020-07-27 DIAGNOSIS — R7303 Prediabetes: Secondary | ICD-10-CM

## 2020-07-27 DIAGNOSIS — Z124 Encounter for screening for malignant neoplasm of cervix: Secondary | ICD-10-CM | POA: Insufficient documentation

## 2020-07-27 LAB — POCT GLYCOSYLATED HEMOGLOBIN (HGB A1C): HbA1c, POC (controlled diabetic range): 6.2 % (ref 0.0–7.0)

## 2020-07-27 LAB — POCT URINE PREGNANCY: Preg Test, Ur: NEGATIVE

## 2020-07-27 LAB — GLUCOSE, POCT (MANUAL RESULT ENTRY): POC Glucose: 151 mg/dL — AB (ref 70–99)

## 2020-07-27 MED ORDER — MEDROXYPROGESTERONE ACETATE 150 MG/ML IM SUSP
150.0000 mg | Freq: Once | INTRAMUSCULAR | Status: AC
  Administered 2020-07-27: 150 mg via INTRAMUSCULAR

## 2020-07-27 MED ORDER — LOSARTAN POTASSIUM 50 MG PO TABS
ORAL_TABLET | Freq: Every day | ORAL | 6 refills | Status: DC
  Filled 2020-07-27: qty 30, 30d supply, fill #0
  Filled 2020-08-25: qty 30, 30d supply, fill #1
  Filled 2020-09-27: qty 30, 30d supply, fill #2
  Filled 2020-10-27: qty 30, 30d supply, fill #3
  Filled 2020-11-27: qty 30, 30d supply, fill #4
  Filled 2020-12-29: qty 30, 30d supply, fill #5
  Filled 2021-01-26: qty 30, 30d supply, fill #6

## 2020-07-27 MED ORDER — HYDROCHLOROTHIAZIDE 25 MG PO TABS
ORAL_TABLET | Freq: Every day | ORAL | 6 refills | Status: DC
Start: 2020-07-27 — End: 2021-04-20
  Filled 2020-07-27: qty 30, 30d supply, fill #0
  Filled 2020-09-08: qty 30, 30d supply, fill #1
  Filled 2020-10-17: qty 30, 30d supply, fill #2
  Filled 2020-11-27: qty 30, 30d supply, fill #3
  Filled 2021-01-04: qty 30, 30d supply, fill #4
  Filled 2021-02-08: qty 30, 30d supply, fill #5
  Filled 2021-03-16: qty 30, 30d supply, fill #6

## 2020-07-27 MED ORDER — METFORMIN HCL 500 MG PO TABS
ORAL_TABLET | Freq: Two times a day (BID) | ORAL | 6 refills | Status: DC
  Filled 2020-07-27: qty 120, 30d supply, fill #0
  Filled 2020-10-02: qty 360, 90d supply, fill #1
  Filled 2021-04-20: qty 120, 30d supply, fill #0
  Filled 2021-06-25: qty 120, 30d supply, fill #1

## 2020-07-27 NOTE — Progress Notes (Signed)
Pt also wants to discuss getting back on Depo shot.

## 2020-07-27 NOTE — Progress Notes (Signed)
Subjective:  Patient ID: Crystal Burns, female    DOB: 1980-10-20  Age: 40 y.o. MRN: [id number]  CC: Medication Refill   HPI Crystal Burns is a 40 year old female with a history of hypertension, morbid obesity, prediabetes who presents today for complete physical exam.  She is due for Pap smear. She informs me have a family history of breast cancer in her mother who passed at the age of 66 and this has her nervous. She would also like to be tested for STDs. Also requests Depo-Provera injection today.  Her chart does have a diagnosis of type 2 diabetes mellitus however on review of her record her peak A1c is 6.4.  A1c today 6.3.  She is currently on metformin for this. Her eyes have been blurry and tearing L>R and she has difficulty seeing and her symptoms have been chronic. She is having difficulty with weight loss especially since she works from home.  Previously had an appointment with medical weight management which she missed.  She is not open to surgical options. Compliant with her antihypertensive and is doing well on it. Past Medical History:  Diagnosis Date  . Asthma    2010-only bothers pt during bad cold  . Depression    2010-had when mother passed away  . History of gonorrhea   . Hypertension    2004  . Obesity   . Pre-diabetes   . Preeclampsia complicating hypertension 01/21/2015    Past Surgical History:  Procedure Laterality Date  . CHOLECYSTECTOMY    . DILATION AND CURETTAGE OF UTERUS    . WISDOM TOOTH EXTRACTION      Family History  Problem Relation Age of Onset  . Hypertension Mother   . Diabetes Mother   . Cancer Mother        Breast   . Heart attack Mother 67  . Hypertension Father   . Diabetes Father   . Hyperlipidemia Father   . Arthritis Other   . Asthma Other   . Breast cancer Other   . Heart failure Other   . Congenital heart disease Other   . Depression Other   . Heart attack Other     Allergies  Allergen Reactions  .  Bee Venom Hives and Swelling  . Lisinopril Shortness Of Breath and Cough  . Shellfish Allergy Anaphylaxis    Outpatient Medications Prior to Visit  Medication Sig Dispense Refill  . cetirizine (ZYRTEC ALLERGY) 10 MG tablet Take 1 tablet (10 mg total) by mouth daily. In the evening for nasal congestion 30 tablet 3  . fluticasone (FLONASE) 50 MCG/ACT nasal spray Place 1 spray into both nostrils 2 (two) times daily. 16 g 1  . hydrochlorothiazide (HYDRODIURIL) 25 MG tablet TAKE 1 TABLET (25 MG TOTAL) BY MOUTH DAILY. 4 tablet 0  . loperamide (IMODIUM) 2 MG capsule Take 1 capsule (2 mg total) by mouth 4 (four) times daily as needed for diarrhea or loose stools. 12 capsule 0  . losartan (COZAAR) 50 MG tablet TAKE 1 TABLET (50 MG TOTAL) BY MOUTH DAILY. 4 tablet 0  . metFORMIN (GLUCOPHAGE) 500 MG tablet TAKE 2 TABLETS (1,000 MG TOTAL) BY MOUTH 2 (TWO) TIMES DAILY WITH A MEAL. 120 tablet 0  . metFORMIN (GLUCOPHAGE) 500 MG tablet TAKE 2 TABLETS (1,000 MG TOTAL) BY MOUTH 2 (TWO) TIMES DAILY WITH A MEAL. 4 tablet 0  . omeprazole (PRILOSEC) 20 MG capsule Take 1 capsule (20 mg total) by mouth daily. 30 capsule 6  .  polyethylene glycol (MIRALAX) packet Take one capful in large glass several times a day until you go to the bathroom, then once a day 14 each 4  . albuterol (PROVENTIL) (2.5 MG/3ML) 0.083% nebulizer solution Take 3 mLs (2.5 mg total) by nebulization every 6 (six) hours as needed for wheezing or shortness of breath. (Patient not taking: Reported on 07/27/2020) 75 mL 0  . albuterol (VENTOLIN HFA) 108 (90 Base) MCG/ACT inhaler Inhale 2 puffs into the lungs every 4 (four) hours as needed for wheezing or shortness of breath (cough, shortness of breath or wheezing.). (Patient not taking: Reported on 07/27/2020) 18 g 0  . azelastine (ASTELIN) 0.1 % nasal spray Place 2 sprays into both nostrils 2 (two) times daily. Use in each nostril as directed (Patient not taking: Reported on 07/27/2020) 30 mL 2  .  benzonatate (TESSALON) 100 MG capsule Take 1-2 capsules (100-200 mg total) by mouth 3 (three) times daily as needed for cough. (Patient not taking: Reported on 07/27/2020) 60 capsule 0  . montelukast (SINGULAIR) 10 MG tablet Take 1 tablet (10 mg total) by mouth at bedtime. (Patient not taking: Reported on 07/27/2020) 30 tablet 0  . predniSONE (DELTASONE) 50 MG tablet Take 1 tab daily for 3 days (Patient not taking: Reported on 07/27/2020) 5 tablet 0  . promethazine-dextromethorphan (PROMETHAZINE-DM) 6.25-15 MG/5ML syrup Take 5 mLs by mouth at bedtime as needed for cough. (Patient not taking: Reported on 07/27/2020) 100 mL 0  . pseudoephedrine (SUDAFED) 30 MG tablet Take 1 tablet (30 mg total) by mouth every 8 (eight) hours as needed for congestion. (Patient not taking: Reported on 07/27/2020) 30 tablet 0   No facility-administered medications prior to visit.     ROS Review of Systems  Constitutional: Negative for activity change, appetite change and fatigue.  HENT: Negative for congestion, sinus pressure and sore throat.   Eyes: Positive for visual disturbance.  Respiratory: Negative for cough, chest tightness, shortness of breath and wheezing.   Cardiovascular: Negative for chest pain and palpitations.  Gastrointestinal: Negative for abdominal distention, abdominal pain and constipation.  Endocrine: Negative for polydipsia.  Genitourinary: Negative for dysuria and frequency.  Musculoskeletal: Negative for arthralgias and back pain.  Skin: Negative for rash.  Neurological: Negative for tremors, light-headedness and numbness.  Hematological: Does not bruise/bleed easily.  Psychiatric/Behavioral: Negative for agitation and behavioral problems.    Objective:  BP 111/74   Pulse 73   Resp 17   Ht $R'5\' 3"'Fa$  (1.6 m)   Wt (!) 321 lb 12.8 oz (146 kg)   SpO2 100%   BMI 57.00 kg/m   BP/Weight 07/27/2020 05/22/2020 58/11/3252  Systolic BP 982 641 -  Diastolic BP 74 91 -  Wt. (Lbs) 321.8 - 320  BMI  57 - 56.69      Physical Exam Constitutional:      General: She is not in acute distress.    Appearance: She is well-developed. She is not diaphoretic.  HENT:     Head: Normocephalic.     Right Ear: External ear normal.     Left Ear: External ear normal.     Nose: Nose normal.  Eyes:     Conjunctiva/sclera: Conjunctivae normal.     Pupils: Pupils are equal, round, and reactive to light.  Neck:     Vascular: No JVD.  Cardiovascular:     Rate and Rhythm: Normal rate and regular rhythm.     Heart sounds: Normal heart sounds. No murmur heard. No gallop.   Pulmonary:  Effort: Pulmonary effort is normal. No respiratory distress.     Breath sounds: Normal breath sounds. No wheezing or rales.  Chest:     Chest wall: No tenderness.  Breasts:     Right: No mass, tenderness, axillary adenopathy or supraclavicular adenopathy.     Left: No mass, tenderness, axillary adenopathy or supraclavicular adenopathy.    Abdominal:     General: Bowel sounds are normal. There is no distension.     Palpations: Abdomen is soft. There is no mass.     Tenderness: There is no abdominal tenderness.  Genitourinary:    Comments: External genitalia, vagina, cervix-normal Musculoskeletal:        General: No tenderness. Normal range of motion.     Cervical back: Normal range of motion.  Lymphadenopathy:     Upper Body:     Right upper body: No supraclavicular or axillary adenopathy.     Left upper body: No supraclavicular or axillary adenopathy.  Skin:    General: Skin is warm and dry.  Neurological:     Mental Status: She is alert and oriented to person, place, and time.     Deep Tendon Reflexes: Reflexes are normal and symmetric.     CMP Latest Ref Rng & Units 04/22/2019 11/20/2018 07/23/2016  Glucose 65 - 99 mg/dL 92 105(H) 81  BUN 6 - 20 mg/dL $Remove'10 15 12  'kgMEZKc$ Creatinine 0.57 - 1.00 mg/dL 0.62 0.69 0.55(L)  Sodium 134 - 144 mmol/L 141 138 142  Potassium 3.5 - 5.2 mmol/L 4.2 4.0 4.0  Chloride 96  - 106 mmol/L 104 100 104  CO2 20 - 29 mmol/L $RemoveB'27 25 26  'DborAeou$ Calcium 8.7 - 10.2 mg/dL 9.9 9.9 9.4  Total Protein 6.0 - 8.5 g/dL 6.5 - 6.1  Total Bilirubin 0.0 - 1.2 mg/dL 0.3 - 0.3  Alkaline Phos 39 - 117 IU/L 85 - 72  AST 0 - 40 IU/L 13 - 36  ALT 0 - 32 IU/L 14 - 70(H)    Lipid Panel     Component Value Date/Time   CHOL 143 07/23/2016 1508   TRIG 113 07/23/2016 1508   HDL 28 (L) 07/23/2016 1508   CHOLHDL 5.1 (H) 07/23/2016 1508   LDLCALC 92 07/23/2016 1508    CBC    Component Value Date/Time   WBC 7.9 11/11/2017 1616   RBC 4.37 11/11/2017 1616   HGB 10.8 (L) 11/11/2017 1616   HCT 36.1 11/11/2017 1616   PLT 308 11/11/2017 1616   MCV 82.6 11/11/2017 1616   MCH 24.7 (L) 11/11/2017 1616   MCHC 29.9 (L) 11/11/2017 1616   RDW 14.7 11/11/2017 1616   LYMPHSABS 2.0 11/11/2017 1616   MONOABS 0.4 11/11/2017 1616   EOSABS 0.4 11/11/2017 1616   BASOSABS 0.0 11/11/2017 1616    Lab Results  Component Value Date   HGBA1C 6.3 (H) 04/22/2019    Assessment & Plan:  1. Annual physical exam Counseled on 150 minutes of exercise per week, healthy eating (including decreased daily intake of saturated fats, cholesterol, added sugars, sodium), STI prevention, routine healthcare maintenance. - CMP14+EGFR - CBC with Differential/Platelet  2. Prediabetes A1c of 6.3 Continue metformin Lifestyle modification to prevent progression to diabetes mellitus - POCT glucose (manual entry) - POCT glycosylated hemoglobin (Hb A1C)  3. Screening for cervical cancer - Cytology - PAP([place]) - Cervicovaginal ancillary only  4. Essential hypertension Controlled Counseled on blood pressure goal of less than 130/80, low-sodium, DASH diet, medication compliance, 150 minutes of moderate intensity exercise  per week. Discussed medication compliance, adverse effects. - losartan (COZAAR) 50 MG tablet; TAKE 1 TABLET (50 MG TOTAL) BY MOUTH DAILY.  Dispense: 30 tablet; Refill: 6 - hydrochlorothiazide  (HYDRODIURIL) 25 MG tablet; TAKE 1 TABLET (25 MG TOTAL) BY MOUTH DAILY.  Dispense: 30 tablet; Refill: 6  5. Family history of breast cancer - MM 3D SCREEN BREAST BILATERAL; Future  6. Morbid obesity ([place]) Reduce portion sizes, restrict caloric intake Sedentary lifestyle has not been helpful She will benefit from beginning an exercise regimen working up to 150 minutes of moderate intensity exercise per week - Amb ref to Medical Nutrition Therapy-MNT  7. Blurry vision, bilateral - Ambulatory referral to Ophthalmology  8. Encounter for Depo-Provera contraception - POCT urine pregnancy - medroxyPROGESTERone (DEPO-PROVERA) injection 150 mg   No orders of the defined types were placed in this encounter.         Charlott Rakes, MD, FAAFP. [facility] [place], [place]   07/27/2020, 10:29 AM

## 2020-07-28 ENCOUNTER — Other Ambulatory Visit: Payer: Self-pay

## 2020-07-28 ENCOUNTER — Other Ambulatory Visit: Payer: Self-pay | Admitting: Family Medicine

## 2020-07-28 LAB — CBC WITH DIFFERENTIAL/PLATELET
Basophils Absolute: 0 10*3/uL (ref 0.0–0.2)
Basos: 1 %
EOS (ABSOLUTE): 0.2 10*3/uL (ref 0.0–0.4)
Eos: 2 %
Hematocrit: 33.3 % — ABNORMAL LOW (ref 34.0–46.6)
Hemoglobin: 10.5 g/dL — ABNORMAL LOW (ref 11.1–15.9)
Immature Grans (Abs): 0.1 10*3/uL (ref 0.0–0.1)
Immature Granulocytes: 1 %
Lymphocytes Absolute: 2 10*3/uL (ref 0.7–3.1)
Lymphs: 24 %
MCH: 24.4 pg — ABNORMAL LOW (ref 26.6–33.0)
MCHC: 31.5 g/dL (ref 31.5–35.7)
MCV: 77 fL — ABNORMAL LOW (ref 79–97)
Monocytes Absolute: 0.5 10*3/uL (ref 0.1–0.9)
Monocytes: 6 %
Neutrophils Absolute: 5.4 10*3/uL (ref 1.4–7.0)
Neutrophils: 66 %
Platelets: 380 10*3/uL (ref 150–450)
RBC: 4.31 x10E6/uL (ref 3.77–5.28)
RDW: 14.7 % (ref 11.7–15.4)
WBC: 8.1 10*3/uL (ref 3.4–10.8)

## 2020-07-28 LAB — CERVICOVAGINAL ANCILLARY ONLY
Bacterial Vaginitis (gardnerella): POSITIVE — AB
Candida Glabrata: NEGATIVE
Candida Vaginitis: NEGATIVE
Chlamydia: NEGATIVE
Comment: NEGATIVE
Comment: NEGATIVE
Comment: NEGATIVE
Comment: NEGATIVE
Comment: NEGATIVE
Comment: NORMAL
Neisseria Gonorrhea: NEGATIVE
Trichomonas: NEGATIVE

## 2020-07-28 LAB — CMP14+EGFR
ALT: 25 IU/L (ref 0–32)
AST: 16 IU/L (ref 0–40)
Albumin/Globulin Ratio: 1.5 (ref 1.2–2.2)
Albumin: 4 g/dL (ref 3.8–4.8)
Alkaline Phosphatase: 83 IU/L (ref 44–121)
BUN/Creatinine Ratio: 15 (ref 9–23)
BUN: 10 mg/dL (ref 6–20)
Bilirubin Total: 0.3 mg/dL (ref 0.0–1.2)
CO2: 25 mmol/L (ref 20–29)
Calcium: 9.8 mg/dL (ref 8.7–10.2)
Chloride: 98 mmol/L (ref 96–106)
Creatinine, Ser: 0.65 mg/dL (ref 0.57–1.00)
Globulin, Total: 2.7 g/dL (ref 1.5–4.5)
Glucose: 106 mg/dL — ABNORMAL HIGH (ref 65–99)
Potassium: 3.8 mmol/L (ref 3.5–5.2)
Sodium: 140 mmol/L (ref 134–144)
Total Protein: 6.7 g/dL (ref 6.0–8.5)
eGFR: 115 mL/min/{1.73_m2} (ref >59)

## 2020-07-28 MED ORDER — IRON (FERROUS SULFATE) 325 (65 FE) MG PO TABS
325.0000 mg | ORAL_TABLET | Freq: Every day | ORAL | 3 refills | Status: DC
  Filled 2020-07-28: qty 60, fill #0

## 2020-08-02 ENCOUNTER — Other Ambulatory Visit: Payer: Self-pay | Admitting: Family Medicine

## 2020-08-02 ENCOUNTER — Telehealth: Payer: Self-pay | Admitting: Family Medicine

## 2020-08-02 ENCOUNTER — Other Ambulatory Visit: Payer: Self-pay

## 2020-08-02 LAB — CYTOLOGY - PAP
Comment: NEGATIVE
Comment: NEGATIVE
Diagnosis: UNDETERMINED — AB
HPV 16: NEGATIVE
HPV 18 / 45: POSITIVE — AB
High risk HPV: POSITIVE — AB

## 2020-08-02 MED ORDER — METRONIDAZOLE 0.75 % VA GEL
1.0000 | Freq: Every day | VAGINAL | 0 refills | Status: DC
  Filled 2020-08-02: qty 70, 5d supply, fill #0

## 2020-08-02 NOTE — Telephone Encounter (Signed)
Copied from CRM [id number]. Topic: Quick Communication - Lab Results (Clinic Use ONLY) >> Aug 02, 2020 11:16 AM Louie Bun, Rosey Bath D wrote: Patient called and would like to speak with her pcp nurse about her lab test results is she can please call her back, patient is concern about her results, thanks.

## 2020-08-04 ENCOUNTER — Encounter (HOSPITAL_COMMUNITY): Payer: Self-pay | Admitting: Emergency Medicine

## 2020-08-04 ENCOUNTER — Other Ambulatory Visit: Payer: Self-pay

## 2020-08-04 ENCOUNTER — Ambulatory Visit (HOSPITAL_COMMUNITY)
Admission: EM | Admit: 2020-08-04 | Discharge: 2020-08-04 | Disposition: A | Discharge status: Home / Self Care | Payer: Medicaid Other | Attending: Medical Oncology | Admitting: Medical Oncology

## 2020-08-04 DIAGNOSIS — H109 Unspecified conjunctivitis: Secondary | ICD-10-CM | POA: Diagnosis not present

## 2020-08-04 DIAGNOSIS — B9689 Other specified bacterial agents as the cause of diseases classified elsewhere: Secondary | ICD-10-CM | POA: Diagnosis not present

## 2020-08-04 MED ORDER — POLYMYXIN B-TRIMETHOPRIM 10000-0.1 UNIT/ML-% OP SOLN: 1.0000 [drp] | OPHTHALMIC | 0 refills | Status: DC

## 2020-08-04 NOTE — Telephone Encounter (Signed)
Pt has been informed of lab results and given copy of results.

## 2020-08-04 NOTE — ED Provider Notes (Signed)
[facility]    CSN: [id number] Arrival date & time: 08/04/20  1926      History   Chief Complaint Chief Complaint  Patient presents with  . Facial Swelling    HPI Crystal Burns is a 40 y.o. female.   HPI   Eye discharge: Patient reports that last night she started having some eye discharge of her left eye. No injury. She woke up this morning and both eyes were stuck shut.  She states that they feel gritty in nature and she has purulent white drainage from both eyes.  No significant visual changes but states that when she has a bunch of discharge it becomes slightly blurry.  No pain of the eyeball and no contact use, no cold symptoms. She has tried warm compress which has helped the eyelid puffiness a bit. No known sick contacts.   Past Medical History:  Diagnosis Date  . Asthma    2010-only bothers pt during bad cold  . Depression    2010-had when mother passed away  . History of gonorrhea   . Hypertension    2004  . Obesity   . Pre-diabetes   . Preeclampsia complicating hypertension 01/21/2015    Patient Active Problem List   Diagnosis Date Noted  . Bilateral primary osteoarthritis of knee 02/28/2020  . Precordial chest pain 05/03/2019  . Educated about COVID-19 virus infection 05/03/2019  . Hoarseness of voice 03/17/2018  . Gastroesophageal reflux disease without esophagitis 03/17/2018  . Supervision of normal pregnancy, antepartum 12/15/2017  . Acute medial meniscus tear of right knee 02/10/2017  . Vitamin D deficiency 07/10/2015  . Anxiety 06/20/2015  . Costochondritis 05/18/2015  . Primary osteoarthritis of right knee 04/22/2014  . Essential hypertension 07/18/2012  . LUMBOSACRAL STRAIN, ACUTE 07/05/2008  . Morbid obesity (HCC) 02/24/2008  . Asthma with exacerbation 02/24/2008    Past Surgical History:  Procedure Laterality Date  . CHOLECYSTECTOMY    . DILATION AND CURETTAGE OF UTERUS    . WISDOM TOOTH EXTRACTION      OB History     Gravida  11   Para  7   Term  7   Preterm  0   AB  4   Living  6     SAB  4   IAB  0   Ectopic  0   Multiple  0   Live Births  7            Home Medications    Prior to Admission medications   Medication Sig Start Date End Date Taking? Authorizing Provider  Iron, Ferrous Sulfate, 325 (65 Fe) MG TABS Take 325 mg by mouth daily. 07/28/20   Hoy Register, MD  trimethoprim-polymyxin b (POLYTRIM) ophthalmic solution Place 1 drop into both eyes every 4 (four) hours. 08/04/20  Yes Dell Hurtubise M, PA-C  albuterol (PROVENTIL) (2.5 MG/3ML) 0.083% nebulizer solution Take 3 mLs (2.5 mg total) by nebulization every 6 (six) hours as needed for wheezing or shortness of breath. Patient not taking: Reported on 07/27/2020 05/22/20   Wallis Bamberg, PA-C  albuterol (VENTOLIN HFA) 108 (90 Base) MCG/ACT inhaler Inhale 2 puffs into the lungs every 4 (four) hours as needed for wheezing or shortness of breath (cough, shortness of breath or wheezing.). Patient not taking: Reported on 07/27/2020 05/22/20   Wallis Bamberg, PA-C  azelastine (ASTELIN) 0.1 % nasal spray Place 2 sprays into both nostrils 2 (two) times daily. Use in each nostril as directed Patient not taking:  Reported on 07/27/2020 12/10/19   Fulp, Hewitt Shorts, MD  benzonatate (TESSALON) 100 MG capsule Take 1-2 capsules (100-200 mg total) by mouth 3 (three) times daily as needed for cough. Patient not taking: Reported on 07/27/2020 05/22/20   Wallis Bamberg, PA-C  cetirizine (ZYRTEC ALLERGY) 10 MG tablet Take 1 tablet (10 mg total) by mouth daily. In the evening for nasal congestion 12/10/19   Fulp, Cammie, MD  fluticasone (FLONASE) 50 MCG/ACT nasal spray Place 1 spray into both nostrils 2 (two) times daily. 03/06/20   Particia Nearing, PA-C  hydrochlorothiazide (HYDRODIURIL) 25 MG tablet TAKE 1 TABLET (25 MG TOTAL) BY MOUTH DAILY. 07/27/20 07/27/21  Hoy Register, MD  loperamide (IMODIUM) 2 MG capsule Take 1 capsule (2 mg total) by mouth 4  (four) times daily as needed for diarrhea or loose stools. 06/21/18   Eustace Moore, MD  losartan (COZAAR) 50 MG tablet TAKE 1 TABLET (50 MG TOTAL) BY MOUTH DAILY. 07/27/20 07/27/21  Hoy Register, MD  metFORMIN (GLUCOPHAGE) 500 MG tablet TAKE 2 TABLETS (1,000 MG TOTAL) BY MOUTH 2 (TWO) TIMES DAILY WITH A MEAL. 07/24/20 07/24/21  Hoy Register, MD  metFORMIN (GLUCOPHAGE) 500 MG tablet TAKE 2 TABLETS (1,000 MG TOTAL) BY MOUTH 2 (TWO) TIMES DAILY WITH A MEAL. 07/27/20 07/27/21  Hoy Register, MD  metroNIDAZOLE (METROGEL VAGINAL) 0.75 % vaginal gel Place 1 Applicatorful vaginally at bedtime. 08/02/20   Hoy Register, MD  montelukast (SINGULAIR) 10 MG tablet Take 1 tablet (10 mg total) by mouth at bedtime. Patient not taking: Reported on 07/27/2020 12/23/19   Particia Nearing, PA-C  omeprazole (PRILOSEC) 20 MG capsule Take 1 capsule (20 mg total) by mouth daily. 03/17/18   Storm Frisk, MD  polyethylene glycol [facility]) packet Take one capful in large glass several times a day until you go to the bathroom, then once a day 05/20/18   Elvina Sidle, MD  predniSONE (DELTASONE) 50 MG tablet Take 1 tab daily for 3 days Patient not taking: Reported on 07/27/2020 03/06/20   Particia Nearing, PA-C  promethazine-dextromethorphan (PROMETHAZINE-DM) 6.25-15 MG/5ML syrup Take 5 mLs by mouth at bedtime as needed for cough. Patient not taking: Reported on 07/27/2020 05/22/20   Wallis Bamberg, PA-C  pseudoephedrine (SUDAFED) 30 MG tablet Take 1 tablet (30 mg total) by mouth every 8 (eight) hours as needed for congestion. Patient not taking: Reported on 07/27/2020 05/22/20   Wallis Bamberg, PA-C    Family History Family History  Problem Relation Age of Onset  . Hypertension Mother   . Diabetes Mother   . Cancer Mother        Breast   . Heart attack Mother 12  . Hypertension Father   . Diabetes Father   . Hyperlipidemia Father   . Arthritis Other   . Asthma Other   . Breast cancer Other   . Heart  failure Other   . Congenital heart disease Other   . Depression Other   . Heart attack Other     Social History Social History   Tobacco Use  . Smoking status: Former Smoker    Types: Cigarettes    Quit date: 10/31/2010    Years since quitting: 9.7  . Smokeless tobacco: Never Used  Vaping Use  . Vaping Use: Never used  Substance Use Topics  . Alcohol use: No    Alcohol/week: 0.0 standard drinks  . Drug use: No     Allergies   Bee venom, Lisinopril, and Shellfish allergy   Review  of Systems Review of Systems  As stated above in HPI Physical Exam Triage Vital Signs ED Triage Vitals  Enc Vitals Group     BP 08/04/20 1944 131/85     Pulse Rate 08/04/20 1944 75     Resp 08/04/20 1944 19     Temp 08/04/20 1944 98.1 F (36.7 C)     Temp Source 08/04/20 1944 Oral     SpO2 08/04/20 1944 98 %     Weight --      Height --      Head Circumference --      Peak Flow --      Pain Score 08/04/20 1941 0     Pain Loc --      Pain Edu? --      Excl. in GC? --    No data found.  Updated Vital Signs BP 131/85 (BP Location: Left Wrist)   Pulse 75   Temp 98.1 F (36.7 C) (Oral)   Resp 19   SpO2 98%   Visual Acuity Right Eye Distance: 20/13 Left Eye Distance: 20/15 Bilateral Distance: 20/13  Right Eye Near:   Left Eye Near:    Bilateral Near:     Physical Exam Vitals and nursing note reviewed.  Constitutional:      General: She is not in acute distress.    Appearance: Normal appearance. She is not ill-appearing, toxic-appearing or diaphoretic.  HENT:     Head: Normocephalic and atraumatic.     Right Ear: Tympanic membrane normal. There is no impacted cerumen.     Left Ear: Tympanic membrane normal. There is no impacted cerumen.     Nose: Nose normal. No congestion or rhinorrhea.     Mouth/Throat:     Mouth: Mucous membranes are moist.  Eyes:     Extraocular Movements: Extraocular movements intact.     Pupils: Pupils are equal, round, and reactive to light.      Comments: White/yellow discharge of bilateral eyes but worse in the left eye. Mild erythema of bilateral conjunctiva. Mild eyelid puffiness.   Cardiovascular:     Rate and Rhythm: Normal rate and regular rhythm.     Heart sounds: Normal heart sounds.  Pulmonary:     Effort: Pulmonary effort is normal.     Breath sounds: Normal breath sounds.  Musculoskeletal:     Cervical back: Neck supple.  Lymphadenopathy:     Cervical: No cervical adenopathy.  Neurological:     Mental Status: She is alert.      UC Treatments / Results  Labs (all labs ordered are listed, but only abnormal results are displayed) Labs Reviewed - No data to display  EKG   Radiology No results found.  Procedures Procedures (including critical care time)  Medications Ordered in UC Medications - No data to display  Initial Impression / Assessment and Plan / UC Course  I have reviewed the triage vital signs and the nursing notes.  Pertinent labs & imaging results that were available during my care of the patient were reviewed by me and considered in my medical decision making (see chart for details).     New.  Appears more bacterial in nature so we will treat with Polytrim drops.  Discussed red flag signs and symptoms. Final Clinical Impressions(s) / UC Diagnoses   Final diagnoses:  Bacterial conjunctivitis of both eyes   Discharge Instructions   None    ED Prescriptions    Medication Sig Dispense Auth. Provider  trimethoprim-polymyxin b (POLYTRIM) ophthalmic solution Place 1 drop into both eyes every 4 (four) hours. 10 mL Rushie Chestnut, [place]     PDMP not reviewed this encounter.   Rushie Chestnut, Cordelia Poche 08/04/20 2009

## 2020-08-04 NOTE — ED Triage Notes (Signed)
Pt presents with watery eyes. States woke up this am with eyes swollen shut. States at times vision is effected.  States using warm compresses for swelling.

## 2020-08-23 ENCOUNTER — Encounter (HOSPITAL_COMMUNITY): Payer: Self-pay

## 2020-08-23 ENCOUNTER — Other Ambulatory Visit: Payer: Self-pay

## 2020-08-23 ENCOUNTER — Ambulatory Visit (HOSPITAL_COMMUNITY)
Admission: EM | Admit: 2020-08-23 | Discharge: 2020-08-23 | Disposition: A | Discharge status: Home / Self Care | Payer: Medicaid Other | Attending: Physician Assistant | Admitting: Physician Assistant

## 2020-08-23 DIAGNOSIS — J069 Acute upper respiratory infection, unspecified: Secondary | ICD-10-CM

## 2020-08-23 DIAGNOSIS — R5383 Other fatigue: Secondary | ICD-10-CM

## 2020-08-23 DIAGNOSIS — U071 COVID-19: Secondary | ICD-10-CM | POA: Insufficient documentation

## 2020-08-23 DIAGNOSIS — R490 Dysphonia: Secondary | ICD-10-CM | POA: Diagnosis not present

## 2020-08-23 DIAGNOSIS — R0981 Nasal congestion: Secondary | ICD-10-CM

## 2020-08-23 DIAGNOSIS — J029 Acute pharyngitis, unspecified: Secondary | ICD-10-CM | POA: Diagnosis not present

## 2020-08-23 DIAGNOSIS — Z87891 Personal history of nicotine dependence: Secondary | ICD-10-CM | POA: Insufficient documentation

## 2020-08-23 DIAGNOSIS — R059 Cough, unspecified: Secondary | ICD-10-CM | POA: Diagnosis not present

## 2020-08-23 DIAGNOSIS — Z8616 Personal history of COVID-19: Secondary | ICD-10-CM | POA: Diagnosis not present

## 2020-08-23 MED ORDER — PROMETHAZINE-DM 6.25-15 MG/5ML PO SYRP
5.0000 mL | ORAL_SOLUTION | Freq: Three times a day (TID) | ORAL | 0 refills | Status: DC | PRN
  Filled 2020-08-23: qty 118, 8d supply, fill #0

## 2020-08-23 MED ORDER — PREDNISONE 10 MG PO TABS
20.0000 mg | ORAL_TABLET | Freq: Every day | ORAL | 0 refills | Status: AC
  Filled 2020-08-23: qty 8, 4d supply, fill #0

## 2020-08-23 NOTE — ED Provider Notes (Signed)
[facility]    CSN: [id number] Arrival date & time: 08/23/20  1709      History   Chief Complaint No chief complaint on file.   HPI Crystal Burns is a 40 y.o. female.   Patient presents today with a 4-day history of cough.  Reports associated sinus pressure, nasal congestion, sore throat, hoarseness.  She reports subjective fever but has not taken her temperature and is afebrile in office today.  She reports significant fatigue and is having difficulty with daily activities.  She denies any nausea, vomiting, chest pain, dizziness, syncope.  She has tried numerous over-the-counter medications including Alka-Seltzer plus without improvement of symptoms.  She does have seasonal allergies and has been taking allergy medication as prescribed.  She has a history of asthma and has felt increased shortness of breath requiring more frequent use of albuterol since symptom onset.  She has had COVID-19 vaccinations but has not had booster.  She has had her flu shot.  Reports that several children in her household have been sick with similar symptoms.  Denies any recent antibiotic use.  She has missed work for 2 days as result of symptoms.     Past Medical History:  Diagnosis Date  . Asthma    2010-only bothers pt during bad cold  . Depression    2010-had when mother passed away  . History of gonorrhea   . Hypertension    2004  . Obesity   . Pre-diabetes   . Preeclampsia complicating hypertension 01/21/2015    Patient Active Problem List   Diagnosis Date Noted  . Bilateral primary osteoarthritis of knee 02/28/2020  . Precordial chest pain 05/03/2019  . Educated about COVID-19 virus infection 05/03/2019  . Hoarseness of voice 03/17/2018  . Gastroesophageal reflux disease without esophagitis 03/17/2018  . Supervision of normal pregnancy, antepartum 12/15/2017  . Acute medial meniscus tear of right knee 02/10/2017  . Vitamin D deficiency 07/10/2015  . Anxiety 06/20/2015   . Costochondritis 05/18/2015  . Primary osteoarthritis of right knee 04/22/2014  . Essential hypertension 07/18/2012  . LUMBOSACRAL STRAIN, ACUTE 07/05/2008  . Morbid obesity (HCC) 02/24/2008  . Asthma with exacerbation 02/24/2008    Past Surgical History:  Procedure Laterality Date  . CHOLECYSTECTOMY    . DILATION AND CURETTAGE OF UTERUS    . WISDOM TOOTH EXTRACTION      OB History    Gravida  11   Para  7   Term  7   Preterm  0   AB  4   Living  6     SAB  4   IAB  0   Ectopic  0   Multiple  0   Live Births  7            Home Medications    Prior to Admission medications   Medication Sig Start Date End Date Taking? Authorizing Provider  Iron, Ferrous Sulfate, 325 (65 Fe) MG TABS Take 325 mg by mouth daily. 07/28/20   Hoy Register, MD  predniSONE (DELTASONE) 10 MG tablet Take 2 tablets (20 mg total) by mouth daily for 4 days. 08/23/20 08/27/20 Yes Alisan Dokes K, PA-C  promethazine-dextromethorphan (PROMETHAZINE-DM) 6.25-15 MG/5ML syrup Take 5 mLs by mouth 3 (three) times daily as needed for cough. 08/23/20  Yes Mkenzie Dotts K, PA-C  albuterol (PROVENTIL) (2.5 MG/3ML) 0.083% nebulizer solution Take 3 mLs (2.5 mg total) by nebulization every 6 (six) hours as needed for wheezing or shortness of breath.  Patient not taking: Reported on 07/27/2020 05/22/20   Wallis Bamberg, PA-C  albuterol (VENTOLIN HFA) 108 (90 Base) MCG/ACT inhaler Inhale 2 puffs into the lungs every 4 (four) hours as needed for wheezing or shortness of breath (cough, shortness of breath or wheezing.). Patient not taking: Reported on 07/27/2020 05/22/20   Wallis Bamberg, PA-C  azelastine (ASTELIN) 0.1 % nasal spray Place 2 sprays into both nostrils 2 (two) times daily. Use in each nostril as directed Patient not taking: Reported on 07/27/2020 12/10/19   Fulp, Hewitt Shorts, MD  benzonatate (TESSALON) 100 MG capsule Take 1-2 capsules (100-200 mg total) by mouth 3 (three) times daily as needed for cough. Patient  not taking: Reported on 07/27/2020 05/22/20   Wallis Bamberg, PA-C  cetirizine (ZYRTEC ALLERGY) 10 MG tablet Take 1 tablet (10 mg total) by mouth daily. In the evening for nasal congestion 12/10/19   Fulp, Cammie, MD  fluticasone (FLONASE) 50 MCG/ACT nasal spray Place 1 spray into both nostrils 2 (two) times daily. 03/06/20   Particia Nearing, PA-C  hydrochlorothiazide (HYDRODIURIL) 25 MG tablet TAKE 1 TABLET (25 MG TOTAL) BY MOUTH DAILY. 07/27/20 07/27/21  Hoy Register, MD  loperamide (IMODIUM) 2 MG capsule Take 1 capsule (2 mg total) by mouth 4 (four) times daily as needed for diarrhea or loose stools. 06/21/18   Eustace Moore, MD  losartan (COZAAR) 50 MG tablet TAKE 1 TABLET (50 MG TOTAL) BY MOUTH DAILY. 07/27/20 07/27/21  Hoy Register, MD  metFORMIN (GLUCOPHAGE) 500 MG tablet TAKE 2 TABLETS (1,000 MG TOTAL) BY MOUTH 2 (TWO) TIMES DAILY WITH A MEAL. 07/24/20 07/24/21  Hoy Register, MD  metFORMIN (GLUCOPHAGE) 500 MG tablet TAKE 2 TABLETS (1,000 MG TOTAL) BY MOUTH 2 (TWO) TIMES DAILY WITH A MEAL. 07/27/20 07/27/21  Hoy Register, MD  metroNIDAZOLE (METROGEL VAGINAL) 0.75 % vaginal gel Place 1 Applicatorful vaginally at bedtime. 08/02/20   Hoy Register, MD  montelukast (SINGULAIR) 10 MG tablet Take 1 tablet (10 mg total) by mouth at bedtime. Patient not taking: Reported on 07/27/2020 12/23/19   Particia Nearing, PA-C  omeprazole (PRILOSEC) 20 MG capsule Take 1 capsule (20 mg total) by mouth daily. 03/17/18   Storm Frisk, MD  polyethylene glycol [facility]) packet Take one capful in large glass several times a day until you go to the bathroom, then once a day 05/20/18   Elvina Sidle, MD  pseudoephedrine (SUDAFED) 30 MG tablet Take 1 tablet (30 mg total) by mouth every 8 (eight) hours as needed for congestion. Patient not taking: Reported on 07/27/2020 05/22/20   Wallis Bamberg, PA-C  trimethoprim-polymyxin b [facility]) ophthalmic solution Place 1 drop into both eyes every 4 (four) hours.  08/04/20   Rushie Chestnut, PA-C    Family History Family History  Problem Relation Age of Onset  . Hypertension Mother   . Diabetes Mother   . Cancer Mother        Breast   . Heart attack Mother 20  . Hypertension Father   . Diabetes Father   . Hyperlipidemia Father   . Arthritis Other   . Asthma Other   . Breast cancer Other   . Heart failure Other   . Congenital heart disease Other   . Depression Other   . Heart attack Other     Social History Social History   Tobacco Use  . Smoking status: Former Smoker    Types: Cigarettes    Quit date: 10/31/2010    Years since quitting: 9.8  .  Smokeless tobacco: Never Used  Vaping Use  . Vaping Use: Never used  Substance Use Topics  . Alcohol use: No    Alcohol/week: 0.0 standard drinks  . Drug use: No     Allergies   Bee venom, Lisinopril, and Shellfish allergy   Review of Systems Review of Systems  Constitutional: Positive for activity change, fatigue and fever (subjective). Negative for appetite change.  HENT: Positive for congestion, postnasal drip, sinus pressure, sneezing, sore throat and voice change. Negative for trouble swallowing.   Respiratory: Positive for cough and shortness of breath.   Cardiovascular: Negative for chest pain.  Gastrointestinal: Negative for abdominal pain, diarrhea, nausea and vomiting.  Musculoskeletal: Negative for arthralgias and myalgias.  Neurological: Positive for headaches. Negative for dizziness and light-headedness.     Physical Exam Triage Vital Signs ED Triage Vitals  Enc Vitals Group     BP 08/23/20 1740 128/82     Pulse Rate 08/23/20 1740 94     Resp 08/23/20 1740 20     Temp 08/23/20 1740 99 F (37.2 C)     Temp Source 08/23/20 1740 Oral     SpO2 08/23/20 1740 99 %     Weight --      Height --      Head Circumference --      Peak Flow --      Pain Score 08/23/20 1738 6     Pain Loc --      Pain Edu? --      Excl. in GC? --    No data found.  Updated  Vital Signs BP 128/82 (BP Location: Right Arm)   Pulse 94   Temp 99 F (37.2 C) (Oral)   Resp 20   LMP  (LMP Unknown)   SpO2 99%   Visual Acuity Right Eye Distance:   Left Eye Distance:   Bilateral Distance:    Right Eye Near:   Left Eye Near:    Bilateral Near:     Physical Exam Vitals reviewed.  Constitutional:      General: She is awake. She is not in acute distress.    Appearance: Normal appearance. She is not ill-appearing.     Comments: Very pleasant female appears stated age in no acute distress  HENT:     Head: Normocephalic and atraumatic.     Right Ear: Tympanic membrane, ear canal and external ear normal. Tympanic membrane is not erythematous or bulging.     Left Ear: Tympanic membrane, ear canal and external ear normal. Tympanic membrane is not erythematous or bulging.     Nose:     Right Sinus: Maxillary sinus tenderness and frontal sinus tenderness present.     Left Sinus: Maxillary sinus tenderness and frontal sinus tenderness present.     Mouth/Throat:     Pharynx: Uvula midline. Posterior oropharyngeal erythema present. No oropharyngeal exudate.     Comments: Moderate erythema and drainage in posterior oropharynx Cardiovascular:     Rate and Rhythm: Normal rate and regular rhythm.     Heart sounds: No murmur heard.   Pulmonary:     Effort: Pulmonary effort is normal.     Breath sounds: Normal breath sounds. No wheezing, rhonchi or rales.     Comments: Clear to auscultation; decreased aeration bilateral bases. Abdominal:     General: Bowel sounds are normal.     Palpations: Abdomen is soft.     Tenderness: There is no abdominal tenderness.  Lymphadenopathy:  Head:     Right side of head: No submental, submandibular or tonsillar adenopathy.     Left side of head: No submental, submandibular or tonsillar adenopathy.     Cervical: No cervical adenopathy.  Psychiatric:        Behavior: Behavior is cooperative.      UC Treatments / Results   Labs (all labs ordered are listed, but only abnormal results are displayed) Labs Reviewed  SARS CORONAVIRUS 2 (TAT 6-24 HRS)    EKG   Radiology No results found.  Procedures Procedures (including critical care time)  Medications Ordered in UC Medications - No data to display  Initial Impression / Assessment and Plan / UC Course  I have reviewed the triage vital signs and the nursing notes.  Pertinent labs & imaging results that were available during my care of the patient were reviewed by me and considered in my medical decision making (see chart for details).     Patient swabbed for COVID-19 given clinical presentation.  Discussed that since she is only been symptomatic for several days and reactive and appropriate at this time.  She was started on prednisone 20 mg burst x4 days.  Discussed that this can raise her blood sugar as she has a history of prediabetes she should limit carbohydrates and drink plenty of fluid.  She was prescribed Promethazine DM for cough with instruction not to drive or drink alcohol with this.  Recommend she use Mucinex and Flonase for additional symptom relief.  Strict return precautions given to which patient expressed understanding.  Final Clinical Impressions(s) / UC Diagnoses   Final diagnoses:  Upper respiratory tract infection, unspecified type  Cough  Nasal congestion  Fatigue, unspecified type     Discharge Instructions     We have swabbed you for COVID and will be in touch with these results as soon as we have them.  Please take 20 mg of prednisone daily.  Do not take NSAIDs (aspirin, ibuprofen/Advil, naproxen/Aleve) with this medication as it can cause stomach bleeding.  Use Promethazine DM up to 3 times a day for cough.  This can make you very sleepy so do not drive or drink alcohol while taking this.  Use Mucinex and Flonase for additional symptom relief.  Make sure you are drinking plenty of fluid.  If anything worsens please return  for reevaluation.    ED Prescriptions    Medication Sig Dispense Auth. Provider   predniSONE (DELTASONE) 10 MG tablet Take 2 tablets (20 mg total) by mouth daily for 4 days. 8 tablet Trysten Bernard K, PA-C   promethazine-dextromethorphan (PROMETHAZINE-DM) 6.25-15 MG/5ML syrup Take 5 mLs by mouth 3 (three) times daily as needed for cough. 118 mL Damarious Holtsclaw K, PA-C     PDMP not reviewed this encounter.   Jeani Hawking, PA-C 08/23/20 1830

## 2020-08-23 NOTE — ED Triage Notes (Signed)
Pt c/o sore throat, nasal congestion, cough and hot flashes. She states she feels weak X 4 days.

## 2020-08-23 NOTE — Discharge Instructions (Addendum)
We have swabbed you for COVID and will be in touch with these results as soon as we have them.  Please take 20 mg of prednisone daily.  Do not take NSAIDs (aspirin, ibuprofen/Advil, naproxen/Aleve) with this medication as it can cause stomach bleeding.  Use Promethazine DM up to 3 times a day for cough.  This can make you very sleepy so do not drive or drink alcohol while taking this.  Use Mucinex and Flonase for additional symptom relief.  Make sure you are drinking plenty of fluid.  If anything worsens please return for reevaluation.

## 2020-08-24 ENCOUNTER — Other Ambulatory Visit: Payer: Self-pay

## 2020-08-24 ENCOUNTER — Telehealth (HOSPITAL_COMMUNITY): Payer: Self-pay | Admitting: Emergency Medicine

## 2020-08-24 LAB — SARS CORONAVIRUS 2 (TAT 6-24 HRS): SARS Coronavirus 2: POSITIVE — AB

## 2020-08-24 MED ORDER — NIRMATRELVIR/RITONAVIR (PAXLOVID)TABLET
1.0000 | ORAL_TABLET | Freq: Two times a day (BID) | ORAL | 0 refills | Status: AC
  Filled 2020-08-24: qty 30, 15d supply, fill #0
  Filled 2020-08-24: qty 10, 5d supply, fill #0

## 2020-08-25 ENCOUNTER — Other Ambulatory Visit: Payer: Self-pay

## 2020-09-08 ENCOUNTER — Other Ambulatory Visit: Payer: Self-pay

## 2020-09-25 ENCOUNTER — Other Ambulatory Visit: Payer: Self-pay

## 2020-09-25 ENCOUNTER — Encounter (HOSPITAL_COMMUNITY): Payer: Self-pay

## 2020-09-25 ENCOUNTER — Ambulatory Visit (HOSPITAL_COMMUNITY)
Admission: EM | Admit: 2020-09-25 | Discharge: 2020-09-25 | Disposition: A | Discharge status: Home / Self Care | Payer: Medicaid Other | Attending: Internal Medicine | Admitting: Internal Medicine

## 2020-09-25 DIAGNOSIS — J309 Allergic rhinitis, unspecified: Secondary | ICD-10-CM | POA: Diagnosis not present

## 2020-09-25 MED ORDER — SALINE SPRAY 0.65 % NA SOLN: 1.0000 | NASAL | 0 refills | Status: DC | PRN

## 2020-09-25 MED ORDER — GUAIFENESIN ER 600 MG PO TB12: 600.0000 mg | ORAL_TABLET | Freq: Two times a day (BID) | ORAL | 0 refills | Status: AC

## 2020-09-25 MED ORDER — DICLOFENAC SODIUM 1 % EX GEL: 4.0000 g | Freq: Four times a day (QID) | CUTANEOUS | 0 refills | Status: DC

## 2020-09-25 NOTE — ED Provider Notes (Signed)
[facility]    CSN: [id number] Arrival date & time: 09/25/20  1638      History   Chief Complaint Chief Complaint  Patient presents with   Leg Pain   Headache    HPI Crystal Burns is a 40 y.o. female with a history of seasonal allergies and asthma comes to urgent care with 2-day history of itchy nasal congestion, occasional coughing and frontal headache.  Patient spent the most part of Saturday in the park with family.  After coming home patient started experiencing the symptoms.  She denies any postnasal drip.  No shortness of breath.  No chest pain or chest pressure.  She sleeps with a fan directed at her.  She does not use a humidifier or Flonase.  Patient describes the headache as frontal and of moderate severity.  No nausea or vomiting.  Patient has osteoarthritis of the right knee.  Right knee pain is worsened since she returned from the park.  She describes the pain as sharp, aggravated by movement with no known relieving factors.  No swelling of the knee or bruising.  No falls or trauma.  HPI  Past Medical History:  Diagnosis Date   Asthma    2010-only bothers pt during bad cold   Depression    2010-had when mother passed away   History of gonorrhea    Hypertension    2004   Obesity    Pre-diabetes    Preeclampsia complicating hypertension 01/21/2015    Patient Active Problem List   Diagnosis Date Noted   Bilateral primary osteoarthritis of knee 02/28/2020   Precordial chest pain 05/03/2019   Educated about COVID-19 virus infection 05/03/2019   Hoarseness of voice 03/17/2018   Gastroesophageal reflux disease without esophagitis 03/17/2018   Supervision of normal pregnancy, antepartum 12/15/2017   Acute medial meniscus tear of right knee 02/10/2017   Vitamin D deficiency 07/10/2015   Anxiety 06/20/2015   Costochondritis 05/18/2015   Primary osteoarthritis of right knee 04/22/2014   Essential hypertension 07/18/2012   LUMBOSACRAL STRAIN,  ACUTE 07/05/2008   Morbid obesity (HCC) 02/24/2008   Asthma with exacerbation 02/24/2008    Past Surgical History:  Procedure Laterality Date   CHOLECYSTECTOMY     DILATION AND CURETTAGE OF UTERUS     WISDOM TOOTH EXTRACTION      OB History     Gravida  11   Para  7   Term  7   Preterm  0   AB  4   Living  6      SAB  4   IAB  0   Ectopic  0   Multiple  0   Live Births  7            Home Medications    Prior to Admission medications   Medication Sig Start Date End Date Taking? Authorizing Provider  diclofenac Sodium (VOLTAREN) 1 % GEL Apply 4 g topically 4 (four) times daily. 09/25/20  Yes Janaiyah Blackard, Britta Mccreedy, MD  guaiFENesin (MUCINEX) 600 MG 12 hr tablet Take 1 tablet (600 mg total) by mouth 2 (two) times daily for 10 days. 09/25/20 10/05/20 Yes Sriya Kroeze, Britta Mccreedy, MD  Iron, Ferrous Sulfate, 325 (65 Fe) MG TABS Take 325 mg by mouth daily. 07/28/20   Hoy Register, MD  sodium chloride (OCEAN) 0.65 % SOLN nasal spray Place 1 spray into both nostrils as needed for congestion. 09/25/20  Yes Myangel Summons, Britta Mccreedy, MD  fluticasone (FLONASE) 50 MCG/ACT nasal  spray Place 1 spray into both nostrils 2 (two) times daily. 03/06/20   Particia Nearing, PA-C  hydrochlorothiazide (HYDRODIURIL) 25 MG tablet TAKE 1 TABLET (25 MG TOTAL) BY MOUTH DAILY. 07/27/20 07/27/21  Hoy Register, MD  losartan (COZAAR) 50 MG tablet TAKE 1 TABLET (50 MG TOTAL) BY MOUTH DAILY. 07/27/20 07/27/21  Hoy Register, MD  metFORMIN (GLUCOPHAGE) 500 MG tablet TAKE 2 TABLETS (1,000 MG TOTAL) BY MOUTH 2 (TWO) TIMES DAILY WITH A MEAL. 07/24/20 07/24/21  Hoy Register, MD  metFORMIN (GLUCOPHAGE) 500 MG tablet TAKE 2 TABLETS (1,000 MG TOTAL) BY MOUTH 2 (TWO) TIMES DAILY WITH A MEAL. 07/27/20 07/27/21  Hoy Register, MD  metroNIDAZOLE (METROGEL VAGINAL) 0.75 % vaginal gel Place 1 Applicatorful vaginally at bedtime. 08/02/20   Hoy Register, MD  promethazine-dextromethorphan (PROMETHAZINE-DM) 6.25-15 MG/5ML  syrup Take 5 mLs by mouth 3 (three) times daily as needed for cough. 08/23/20   Raspet, Noberto Retort, PA-C  trimethoprim-polymyxin b (POLYTRIM) ophthalmic solution Place 1 drop into both eyes every 4 (four) hours. 08/04/20   Rushie Chestnut, PA-C    Family History Family History  Problem Relation Age of Onset   Hypertension Mother    Diabetes Mother    Cancer Mother        Breast    Heart attack Mother 31   Hypertension Father    Diabetes Father    Hyperlipidemia Father    Arthritis Other    Asthma Other    Breast cancer Other    Heart failure Other    Congenital heart disease Other    Depression Other    Heart attack Other     Social History Social History   Tobacco Use   Smoking status: Former    Pack years: 0.00    Types: Cigarettes    Quit date: 10/31/2010    Years since quitting: 9.9   Smokeless tobacco: Never  Vaping Use   Vaping Use: Never used  Substance Use Topics   Alcohol use: No    Alcohol/week: 0.0 standard drinks   Drug use: No     Allergies   Bee venom, Lisinopril, and Shellfish allergy   Review of Systems Review of Systems  Constitutional: Negative.  Negative for chills and fever.  HENT:  Positive for congestion and sore throat. Negative for dental problem and postnasal drip.   Eyes: Negative.   Respiratory:  Positive for cough.   Gastrointestinal: Negative.   Musculoskeletal:  Positive for arthralgias. Negative for joint swelling.    Physical Exam Triage Vital Signs ED Triage Vitals  Enc Vitals Group     BP 09/25/20 1720 104/60     Pulse Rate 09/25/20 1720 70     Resp 09/25/20 1720 17     Temp 09/25/20 1720 98.4 F (36.9 C)     Temp Source 09/25/20 1720 Oral     SpO2 09/25/20 1720 97 %     Weight --      Height --      Head Circumference --      Peak Flow --      Pain Score 09/25/20 1718 7     Pain Loc --      Pain Edu? --      Excl. in GC? --    No data found.  Updated Vital Signs BP 104/60 (BP Location: Right Arm)   Pulse  70   Temp 98.4 F (36.9 C) (Oral)   Resp 17   SpO2 97%   Visual  Acuity Right Eye Distance:   Left Eye Distance:   Bilateral Distance:    Right Eye Near:   Left Eye Near:    Bilateral Near:     Physical Exam Vitals and nursing note reviewed.  Constitutional:      General: She is not in acute distress.    Appearance: She is obese. She is not ill-appearing.  Pulmonary:     Effort: Pulmonary effort is normal.     Breath sounds: Normal breath sounds.  Neurological:     Mental Status: She is alert and oriented to person, place, and time.     GCS: GCS eye subscore is 4. GCS verbal subscore is 5. GCS motor subscore is 6.     Cranial Nerves: No cranial nerve deficit, dysarthria or facial asymmetry.     Sensory: No sensory deficit.  Psychiatric:        Mood and Affect: Mood normal.        Behavior: Behavior normal.     UC Treatments / Results  Labs (all labs ordered are listed, but only abnormal results are displayed) Labs Reviewed - No data to display  EKG   Radiology No results found.  Procedures Procedures (including critical care time)  Medications Ordered in UC Medications - No data to display  Initial Impression / Assessment and Plan / UC Course  I have reviewed the triage vital signs and the nursing notes.  Pertinent labs & imaging results that were available during my care of the patient were reviewed by me and considered in my medical decision making (see chart for details).     1.  Allergic sinusitis with a headache: Patient is advised to start using Flonase, saline nasal spray and a humidifier at nighttime Tylenol or Motrin as needed for headaches. Mucinex twice daily as needed. Voltaren gel for right knee pain Return to urgent care if symptoms worsen. Final Clinical Impressions(s) / UC Diagnoses   Final diagnoses:  Allergic sinusitis     Discharge Instructions      Please restart Flonase use Please use a humidifier at night Salt water  nasal spray will be helpful If symptoms persist please return to urgent care to be reevaluated Please take Tylenol/Motrin for the headaches.   ED Prescriptions     Medication Sig Dispense Auth. Provider   guaiFENesin (MUCINEX) 600 MG 12 hr tablet Take 1 tablet (600 mg total) by mouth 2 (two) times daily for 10 days. 20 tablet Bradee Common, Britta Mccreedy, MD   sodium chloride (OCEAN) 0.65 % SOLN nasal spray Place 1 spray into both nostrils as needed for congestion. 104 mL Starsky Nanna, Britta Mccreedy, MD   diclofenac Sodium (VOLTAREN) 1 % GEL Apply 4 g topically 4 (four) times daily. 150 g Roma Bierlein, Britta Mccreedy, MD      PDMP not reviewed this encounter.   Merrilee Jansky, MD 09/25/20 [phone]

## 2020-09-25 NOTE — ED Triage Notes (Signed)
Pt presents with recurrent right leg pain; pt states she has arthritis but its been bothering her more than usual.  Pt also complains of extra dry nasal passage and ongoing headache past few days.

## 2020-09-25 NOTE — Discharge Instructions (Addendum)
Please restart Flonase use Please use a humidifier at night Salt water nasal spray will be helpful If symptoms persist please return to urgent care to be reevaluated Please take Tylenol/Motrin for the headaches.

## 2020-09-27 ENCOUNTER — Other Ambulatory Visit: Payer: Self-pay

## 2020-09-27 ENCOUNTER — Ambulatory Visit: Payer: Medicaid Other | Admitting: Registered"

## 2020-10-02 ENCOUNTER — Other Ambulatory Visit: Payer: Self-pay

## 2020-10-16 ENCOUNTER — Encounter (HOSPITAL_COMMUNITY): Payer: Self-pay

## 2020-10-16 ENCOUNTER — Ambulatory Visit (HOSPITAL_COMMUNITY)
Admission: EM | Admit: 2020-10-16 | Discharge: 2020-10-16 | Disposition: A | Discharge status: Home / Self Care | Payer: Medicaid Other | Attending: Internal Medicine | Admitting: Internal Medicine

## 2020-10-16 DIAGNOSIS — H65191 Other acute nonsuppurative otitis media, right ear: Secondary | ICD-10-CM | POA: Diagnosis not present

## 2020-10-16 DIAGNOSIS — H60501 Unspecified acute noninfective otitis externa, right ear: Secondary | ICD-10-CM

## 2020-10-16 MED ORDER — AMOXICILLIN 875 MG PO TABS
875.0000 mg | ORAL_TABLET | Freq: Two times a day (BID) | ORAL | 0 refills | Status: AC
  Filled 2020-10-17: qty 20, 10d supply, fill #0

## 2020-10-16 MED ORDER — NEOMYCIN-POLYMYXIN-HC 3.5-10000-1 OT SUSP: 4.0000 [drp] | Freq: Three times a day (TID) | OTIC | 0 refills | Status: DC

## 2020-10-16 NOTE — Discharge Instructions (Signed)
You are being treated for right ear infection with oral antibiotic and antibiotic eardrops.  Please follow-up with primary care physician if symptoms do not improve or if they worsen.

## 2020-10-16 NOTE — ED Triage Notes (Signed)
Pt c/o ear pain that has caused headaches X 3 days.   C/o sharp pains in the right ear.

## 2020-10-16 NOTE — ED Provider Notes (Signed)
[facility]    CSN: [id number] Arrival date & time: 10/16/20  1643      History   Chief Complaint Chief Complaint  Patient presents with   Otalgia    HPI Crystal Burns is a 40 y.o. female.   Patient reports to the urgent care with 3-day history of right ear pain that is resulting in right-sided headache for 2 days.  Patient describes pain as sharp.  Denies any pain in left ear.  Denies any fever, or upper respiratory symptoms, sore throat.  Denies any recent swimming but states that she does wear headphones daily at work.  Denies any decreased hearing.   Otalgia  Past Medical History:  Diagnosis Date   Asthma    2010-only bothers pt during bad cold   Depression    2010-had when mother passed away   History of gonorrhea    Hypertension    2004   Obesity    Pre-diabetes    Preeclampsia complicating hypertension 01/21/2015    Patient Active Problem List   Diagnosis Date Noted   Bilateral primary osteoarthritis of knee 02/28/2020   Precordial chest pain 05/03/2019   Educated about COVID-19 virus infection 05/03/2019   Hoarseness of voice 03/17/2018   Gastroesophageal reflux disease without esophagitis 03/17/2018   Supervision of normal pregnancy, antepartum 12/15/2017   Acute medial meniscus tear of right knee 02/10/2017   Vitamin D deficiency 07/10/2015   Anxiety 06/20/2015   Costochondritis 05/18/2015   Primary osteoarthritis of right knee 04/22/2014   Essential hypertension 07/18/2012   LUMBOSACRAL STRAIN, ACUTE 07/05/2008   Morbid obesity (HCC) 02/24/2008   Asthma with exacerbation 02/24/2008    Past Surgical History:  Procedure Laterality Date   CHOLECYSTECTOMY     DILATION AND CURETTAGE OF UTERUS     WISDOM TOOTH EXTRACTION      OB History     Gravida  11   Para  7   Term  7   Preterm  0   AB  4   Living  6      SAB  4   IAB  0   Ectopic  0   Multiple  0   Live Births  7            Home Medications     Prior to Admission medications   Medication Sig Start Date End Date Taking? Authorizing Provider  amoxicillin (AMOXIL) 875 MG tablet Take 1 tablet (875 mg total) by mouth 2 (two) times daily for 10 days. 10/16/20 10/26/20 Yes Lance Muss, FNP  Iron, Ferrous Sulfate, 325 (65 Fe) MG TABS Take 325 mg by mouth daily. 07/28/20   Hoy Register, MD  neomycin-polymyxin-hydrocortisone (CORTISPORIN) 3.5-10000-1 OTIC suspension Place 4 drops into the right ear 3 (three) times daily. 10/16/20  Yes Lance Muss, FNP  diclofenac Sodium (VOLTAREN) 1 % GEL Apply 4 g topically 4 (four) times daily. 09/25/20   Merrilee Jansky, MD  fluticasone (FLONASE) 50 MCG/ACT nasal spray Place 1 spray into both nostrils 2 (two) times daily. 03/06/20   Particia Nearing, PA-C  hydrochlorothiazide (HYDRODIURIL) 25 MG tablet TAKE 1 TABLET (25 MG TOTAL) BY MOUTH DAILY. 07/27/20 07/27/21  Hoy Register, MD  losartan (COZAAR) 50 MG tablet TAKE 1 TABLET (50 MG TOTAL) BY MOUTH DAILY. 07/27/20 07/27/21  Hoy Register, MD  metFORMIN (GLUCOPHAGE) 500 MG tablet TAKE 2 TABLETS (1,000 MG TOTAL) BY MOUTH 2 (TWO) TIMES DAILY WITH A MEAL. 07/24/20 07/24/21  Hoy Register,  MD  metFORMIN (GLUCOPHAGE) 500 MG tablet TAKE 2 TABLETS (1,000 MG TOTAL) BY MOUTH 2 (TWO) TIMES DAILY WITH A MEAL. 07/27/20 07/27/21  Hoy Register, MD  metroNIDAZOLE (METROGEL VAGINAL) 0.75 % vaginal gel Place 1 Applicatorful vaginally at bedtime. 08/02/20   Hoy Register, MD  promethazine-dextromethorphan (PROMETHAZINE-DM) 6.25-15 MG/5ML syrup Take 5 mLs by mouth 3 (three) times daily as needed for cough. 08/23/20   Raspet, Noberto Retort, PA-C  sodium chloride (OCEAN) 0.65 % SOLN nasal spray Place 1 spray into both nostrils as needed for congestion. 09/25/20   LampteyBritta Mccreedy, MD    Family History Family History  Problem Relation Age of Onset   Hypertension Mother    Diabetes Mother    Cancer Mother        Breast    Heart attack Mother 55   Hypertension Father     Diabetes Father    Hyperlipidemia Father    Arthritis Other    Asthma Other    Breast cancer Other    Heart failure Other    Congenital heart disease Other    Depression Other    Heart attack Other     Social History Social History   Tobacco Use   Smoking status: Former    Pack years: 0.00    Types: Cigarettes    Quit date: 10/31/2010    Years since quitting: 9.9   Smokeless tobacco: Never  Vaping Use   Vaping Use: Never used  Substance Use Topics   Alcohol use: No    Alcohol/week: 0.0 standard drinks   Drug use: No     Allergies   Bee venom, Lisinopril, and Shellfish allergy   Review of Systems Review of Systems  HENT:  Positive for ear pain.   Per HPI  Physical Exam Triage Vital Signs ED Triage Vitals  Enc Vitals Group     BP 10/16/20 1905 136/84     Pulse Rate 10/16/20 1905 64     Resp 10/16/20 1905 18     Temp 10/16/20 1905 98.5 F (36.9 C)     Temp Source 10/16/20 1905 Oral     SpO2 10/16/20 1905 98 %     Weight --      Height --      Head Circumference --      Peak Flow --      Pain Score 10/16/20 1903 10     Pain Loc --      Pain Edu? --      Excl. in GC? --    No data found.  Updated Vital Signs BP 136/84 (BP Location: Right Arm)   Pulse 64   Temp 98.5 F (36.9 C) (Oral)   Resp 18   LMP  (LMP Unknown)   SpO2 98%   Visual Acuity Right Eye Distance:   Left Eye Distance:   Bilateral Distance:    Right Eye Near:   Left Eye Near:    Bilateral Near:     Physical Exam Constitutional:      General: She is not in acute distress.    Appearance: Normal appearance.  HENT:     Head: Normocephalic and atraumatic.     Right Ear: Swelling and tenderness present. Tympanic membrane is bulging.     Left Ear: Ear canal normal. A middle ear effusion is present.     Ears:     Comments: Mild swelling to right external canal. Eyes:     Extraocular Movements: Extraocular movements intact.  Conjunctiva/sclera: Conjunctivae normal.   Pulmonary:     Effort: Pulmonary effort is normal.  Neurological:     General: No focal deficit present.     Mental Status: She is alert and oriented to person, place, and time. Mental status is at baseline.  Psychiatric:        Mood and Affect: Mood normal.        Behavior: Behavior normal.        Thought Content: Thought content normal.        Judgment: Judgment normal.     UC Treatments / Results  Labs (all labs ordered are listed, but only abnormal results are displayed) Labs Reviewed - No data to display  EKG   Radiology No results found.  Procedures Procedures (including critical care time)  Medications Ordered in UC Medications - No data to display  Initial Impression / Assessment and Plan / UC Course  I have reviewed the triage vital signs and the nursing notes.  Pertinent labs & imaging results that were available during my care of the patient were reviewed by me and considered in my medical decision making (see chart for details).     Will treat right otitis media and right otitis externa with amoxicillin x10 days and Cortisporin eardrops.  Patient to follow-up with PCP if symptoms do not improve or worsen.  Patient advised to go to the ED if decreased hearing occurs.  May take over-the-counter ibuprofen or Tylenol as needed for pain and headache.  Discussed strict return precautions. Patient verbalized understanding and is agreeable with plan.  Final Clinical Impressions(s) / UC Diagnoses   Final diagnoses:  Other non-recurrent acute nonsuppurative otitis media of right ear  Acute otitis externa of right ear, unspecified type     Discharge Instructions      You are being treated for right ear infection with oral antibiotic and antibiotic eardrops.  Please follow-up with primary care physician if symptoms do not improve or if they worsen.     ED Prescriptions     Medication Sig Dispense Auth. Provider   amoxicillin (AMOXIL) 875 MG tablet Take 1  tablet (875 mg total) by mouth 2 (two) times daily for 10 days. 20 tablet Lance Muss, FNP   neomycin-polymyxin-hydrocortisone (CORTISPORIN) 3.5-10000-1 OTIC suspension Place 4 drops into the right ear 3 (three) times daily. 10 mL Lance Muss, FNP      PDMP not reviewed this encounter.   Lance Muss, FNP 10/16/20 1926

## 2020-10-17 ENCOUNTER — Other Ambulatory Visit: Payer: Self-pay

## 2020-10-20 ENCOUNTER — Other Ambulatory Visit: Payer: Self-pay

## 2020-10-20 ENCOUNTER — Ambulatory Visit: Payer: Medicaid Other | Attending: Family Medicine

## 2020-10-20 DIAGNOSIS — Z3042 Encounter for surveillance of injectable contraceptive: Secondary | ICD-10-CM | POA: Diagnosis not present

## 2020-10-20 MED ORDER — MEDROXYPROGESTERONE ACETATE 150 MG/ML IM SUSP
150.0000 mg | Freq: Once | INTRAMUSCULAR | Status: AC
  Administered 2020-10-20: 150 mg via INTRAMUSCULAR

## 2020-10-20 NOTE — Progress Notes (Signed)
Pt arrived at clinic on time for Depo-shot No urin HCG needed Pt was given injection in right gluteal muscle. Pt tolerated injection well. Pt to return Sept-30-Oct 14

## 2020-10-27 ENCOUNTER — Other Ambulatory Visit: Payer: Self-pay

## 2020-10-27 ENCOUNTER — Ambulatory Visit
Admission: RE | Admit: 2020-10-27 | Discharge: 2020-10-27 | Disposition: A | Discharge status: Home / Self Care | Payer: Medicaid Other | Source: Ambulatory Visit | Attending: Family Medicine | Admitting: Family Medicine

## 2020-10-27 DIAGNOSIS — Z803 Family history of malignant neoplasm of breast: Secondary | ICD-10-CM

## 2020-11-03 NOTE — Progress Notes (Signed)
Pt has viewed results via Mychart.

## 2020-11-27 ENCOUNTER — Ambulatory Visit: Payer: Self-pay

## 2020-11-27 ENCOUNTER — Other Ambulatory Visit: Payer: Self-pay

## 2020-11-27 NOTE — Telephone Encounter (Signed)
Pt. Reports she has had vaginal bleeding "ever since I got my Depo shot." Also has upper abdominal pain x 2 days. Does not know if these are related.Feels weak as well. Requests appointment as soon as possible. No answer on FC line. Please advise pt.    Answer Assessment - Initial Assessment Questions 1. LOCATION: "Where does it hurt?"      Upper stomach 2. RADIATION: "Does the pain shoot anywhere else?" (e.g., chest, back)     No 3. ONSET: "When did the pain begin?" (e.g., minutes, hours or days ago)      3 days ago 4. SUDDEN: "Gradual or sudden onset?"     Sudden 5. PATTERN "Does the pain come and go, or is it constant?"    - If constant: "Is it getting better, staying the same, or worsening?"      (Note: Constant means the pain never goes away completely; most serious pain is constant and it progresses)     - If intermittent: "How long does it last?" "Do you have pain now?"     (Note: Intermittent means the pain goes away completely between bouts)     Comes and goes 6. SEVERITY: "How bad is the pain?"  (e.g., Scale 1-10; mild, moderate, or severe)   - MILD (1-3): doesn't interfere with normal activities, abdomen soft and not tender to touch    - MODERATE (4-7): interferes with normal activities or awakens from sleep, abdomen tender to touch    - SEVERE (8-10): excruciating pain, doubled over, unable to do any normal activities      Moderate 7. RECURRENT SYMPTOM: "Have you ever had this type of stomach pain before?" If Yes, ask: "When was the last time?" and "What happened that time?"      No 8. CAUSE: "What do you think is causing the stomach pain?"     Unsure 9. RELIEVING/AGGRAVATING FACTORS: "What makes it better or worse?" (e.g., movement, antacids, bowel movement)     Ibu 10. OTHER SYMPTOMS: "Do you have any other symptoms?" (e.g., back pain, diarrhea, fever, urination pain, vomiting)       Having vaginal bleeding 11. PREGNANCY: "Is there any chance you are pregnant?" "When was  your last menstrual period?"       No  Protocols used: Abdominal Pain - [facility]

## 2020-11-29 NOTE — Telephone Encounter (Signed)
If there is no appointment in clinic please refer to the mobile unit.

## 2020-12-19 ENCOUNTER — Ambulatory Visit (HOSPITAL_COMMUNITY)
Admission: EM | Admit: 2020-12-19 | Discharge: 2020-12-19 | Disposition: A | Discharge status: Home / Self Care | Payer: Medicaid Other | Attending: Student | Admitting: Student

## 2020-12-19 ENCOUNTER — Encounter (HOSPITAL_COMMUNITY): Payer: Self-pay | Admitting: Emergency Medicine

## 2020-12-19 ENCOUNTER — Other Ambulatory Visit: Payer: Self-pay

## 2020-12-19 DIAGNOSIS — Z1152 Encounter for screening for COVID-19: Secondary | ICD-10-CM | POA: Diagnosis not present

## 2020-12-19 DIAGNOSIS — J029 Acute pharyngitis, unspecified: Secondary | ICD-10-CM | POA: Insufficient documentation

## 2020-12-19 DIAGNOSIS — Z112 Encounter for screening for other bacterial diseases: Secondary | ICD-10-CM | POA: Insufficient documentation

## 2020-12-19 LAB — POCT RAPID STREP A, ED / UC: Streptococcus, Group A Screen (Direct): NEGATIVE

## 2020-12-19 LAB — SARS CORONAVIRUS 2 (TAT 6-24 HRS): SARS Coronavirus 2: NEGATIVE

## 2020-12-19 MED ORDER — PREDNISONE 20 MG PO TABS: 20.0000 mg | ORAL_TABLET | Freq: Every day | ORAL | 0 refills | Status: AC

## 2020-12-19 MED ORDER — LIDOCAINE VISCOUS HCL 2 % MT SOLN: 15.0000 mL | OROMUCOSAL | 0 refills | Status: DC | PRN

## 2020-12-19 NOTE — ED Triage Notes (Signed)
Pt reports went to a party on Saturday. Sunday having congestion, sore throat, sinus pain, fatigue.  Had some hoarseness in voice and unable work yesterday.

## 2020-12-19 NOTE — ED Provider Notes (Signed)
[facility]    CSN: [id number] Arrival date & time: 12/19/20  1233      History   Chief Complaint Chief Complaint  Patient presents with   Nasal Congestion   Cough    HPI Crystal Burns is a 40 y.o. female presenting with sore throat, cough, congestion, generalized body aches, fatigue for about 3 days following a large gathering.  Medical history asthma, obesity, prediabetes.  Has not tried medications for her symptoms.  HPI  Past Medical History:  Diagnosis Date   Asthma    2010-only bothers pt during bad cold   Depression    2010-had when mother passed away   History of gonorrhea    Hypertension    2004   Obesity    Pre-diabetes    Preeclampsia complicating hypertension 01/21/2015    Patient Active Problem List   Diagnosis Date Noted   Bilateral primary osteoarthritis of knee 02/28/2020   Precordial chest pain 05/03/2019   Educated about COVID-19 virus infection 05/03/2019   Hoarseness of voice 03/17/2018   Gastroesophageal reflux disease without esophagitis 03/17/2018   Supervision of normal pregnancy, antepartum 12/15/2017   Acute medial meniscus tear of right knee 02/10/2017   Vitamin D deficiency 07/10/2015   Anxiety 06/20/2015   Costochondritis 05/18/2015   Primary osteoarthritis of right knee 04/22/2014   Essential hypertension 07/18/2012   LUMBOSACRAL STRAIN, ACUTE 07/05/2008   Morbid obesity (HCC) 02/24/2008   Asthma with exacerbation 02/24/2008    Past Surgical History:  Procedure Laterality Date   CHOLECYSTECTOMY     DILATION AND CURETTAGE OF UTERUS     WISDOM TOOTH EXTRACTION      OB History     Gravida  11   Para  7   Term  7   Preterm  0   AB  4   Living  6      SAB  4   IAB  0   Ectopic  0   Multiple  0   Live Births  7            Home Medications    Prior to Admission medications   Medication Sig Start Date End Date Taking? Authorizing Provider  Iron, Ferrous Sulfate, 325 (65 Fe) MG TABS  Take 325 mg by mouth daily. 07/28/20   Hoy Register, MD  lidocaine (XYLOCAINE) 2 % solution Use as directed 15 mLs in the mouth or throat as needed for mouth pain. 12/19/20  Yes Rhys Martini, PA-C  predniSONE (DELTASONE) 20 MG tablet Take 1 tablet (20 mg total) by mouth daily for 5 days. 12/19/20 12/24/20 Yes Rhys Martini, PA-C  diclofenac Sodium (VOLTAREN) 1 % GEL Apply 4 g topically 4 (four) times daily. 09/25/20   Merrilee Jansky, MD  fluticasone (FLONASE) 50 MCG/ACT nasal spray Place 1 spray into both nostrils 2 (two) times daily. 03/06/20   Particia Nearing, PA-C  hydrochlorothiazide (HYDRODIURIL) 25 MG tablet TAKE 1 TABLET (25 MG TOTAL) BY MOUTH DAILY. 07/27/20 07/27/21  Hoy Register, MD  losartan (COZAAR) 50 MG tablet TAKE 1 TABLET (50 MG TOTAL) BY MOUTH DAILY. 07/27/20 07/27/21  Hoy Register, MD  metFORMIN (GLUCOPHAGE) 500 MG tablet TAKE 2 TABLETS (1,000 MG TOTAL) BY MOUTH 2 (TWO) TIMES DAILY WITH A MEAL. 07/24/20 07/24/21  Hoy Register, MD  metFORMIN (GLUCOPHAGE) 500 MG tablet TAKE 2 TABLETS (1,000 MG TOTAL) BY MOUTH 2 (TWO) TIMES DAILY WITH A MEAL. 07/27/20 07/27/21  Hoy Register, MD  metroNIDAZOLE (METROGEL  VAGINAL) 0.75 % vaginal gel Place 1 Applicatorful vaginally at bedtime. 08/02/20   Hoy Register, MD  neomycin-polymyxin-hydrocortisone (CORTISPORIN) 3.5-10000-1 OTIC suspension Place 4 drops into the right ear 3 (three) times daily. 10/16/20   Lance Muss, FNP  promethazine-dextromethorphan (PROMETHAZINE-DM) 6.25-15 MG/5ML syrup Take 5 mLs by mouth 3 (three) times daily as needed for cough. 08/23/20   Raspet, Noberto Retort, PA-C  sodium chloride (OCEAN) 0.65 % SOLN nasal spray Place 1 spray into both nostrils as needed for congestion. 09/25/20   LampteyBritta Mccreedy, MD    Family History Family History  Problem Relation Age of Onset   Breast cancer Mother    Hypertension Mother    Diabetes Mother    Cancer Mother        Breast    Heart attack Mother 24   Hypertension  Father    Diabetes Father    Hyperlipidemia Father    Arthritis Other    Asthma Other    Breast cancer Other    Heart failure Other    Congenital heart disease Other    Depression Other    Heart attack Other     Social History Social History   Tobacco Use   Smoking status: Former    Types: Cigarettes    Quit date: 10/31/2010    Years since quitting: 10.1   Smokeless tobacco: Never  Vaping Use   Vaping Use: Never used  Substance Use Topics   Alcohol use: No    Alcohol/week: 0.0 standard drinks   Drug use: No     Allergies   Bee venom, Lisinopril, and Shellfish allergy   Review of Systems Review of Systems  Constitutional:  Positive for fatigue. Negative for appetite change, chills and fever.  HENT:  Positive for sore throat. Negative for congestion, ear pain, rhinorrhea, sinus pressure and sinus pain.   Eyes:  Negative for redness and visual disturbance.  Respiratory:  Positive for cough. Negative for chest tightness, shortness of breath and wheezing.   Cardiovascular:  Negative for chest pain and palpitations.  Gastrointestinal:  Negative for abdominal pain, constipation, diarrhea, nausea and vomiting.  Genitourinary:  Negative for dysuria, frequency and urgency.  Musculoskeletal:  Positive for myalgias.  Neurological:  Negative for dizziness, weakness and headaches.  Psychiatric/Behavioral:  Negative for confusion.   All other systems reviewed and are negative.   Physical Exam Triage Vital Signs ED Triage Vitals  Enc Vitals Group     BP 12/19/20 1321 131/81     Pulse Rate 12/19/20 1321 71     Resp 12/19/20 1321 19     Temp 12/19/20 1321 98.4 F (36.9 C)     Temp Source 12/19/20 1321 Oral     SpO2 12/19/20 1321 96 %     Weight --      Height --      Head Circumference --      Peak Flow --      Pain Score 12/19/20 1319 7     Pain Loc --      Pain Edu? --      Excl. in GC? --    No data found.  Updated Vital Signs BP 131/81 (BP Location: Left Arm)    Pulse 71   Temp 98.4 F (36.9 C) (Oral)   Resp 19   SpO2 96%   Visual Acuity Right Eye Distance:   Left Eye Distance:   Bilateral Distance:    Right Eye Near:   Left Eye Near:  Bilateral Near:     Physical Exam Vitals reviewed.  Constitutional:      General: She is not in acute distress.    Appearance: Normal appearance. She is not ill-appearing.  HENT:     Head: Normocephalic and atraumatic.     Right Ear: Tympanic membrane, ear canal and external ear normal. No tenderness. No middle ear effusion. There is no impacted cerumen. Tympanic membrane is not perforated, erythematous, retracted or bulging.     Left Ear: Tympanic membrane, ear canal and external ear normal. No tenderness.  No middle ear effusion. There is no impacted cerumen. Tympanic membrane is not perforated, erythematous, retracted or bulging.     Nose: Nose normal. No congestion.     Mouth/Throat:     Mouth: Mucous membranes are moist.     Pharynx: Uvula midline. Posterior oropharyngeal erythema present. No oropharyngeal exudate.     Tonsils: No tonsillar exudate.     Comments: On exam, uvula is midline, she is tolerating her secretions without difficulty, there is no trismus, no drooling, she has normal phonation  Eyes:     Extraocular Movements: Extraocular movements intact.     Pupils: Pupils are equal, round, and reactive to light.  Cardiovascular:     Rate and Rhythm: Normal rate and regular rhythm.     Heart sounds: Normal heart sounds.  Pulmonary:     Effort: Pulmonary effort is normal.     Breath sounds: Normal breath sounds. No decreased breath sounds, wheezing, rhonchi or rales.  Abdominal:     Palpations: Abdomen is soft.     Tenderness: There is no abdominal tenderness. There is no guarding or rebound.  Neurological:     General: No focal deficit present.     Mental Status: She is alert and oriented to person, place, and time.  Psychiatric:        Mood and Affect: Mood normal.         Behavior: Behavior normal.        Thought Content: Thought content normal.        Judgment: Judgment normal.     UC Treatments / Results  Labs (all labs ordered are listed, but only abnormal results are displayed) Labs Reviewed  SARS CORONAVIRUS 2 (TAT 6-24 HRS)  CULTURE, GROUP A STREP [facility])  POCT RAPID STREP A, ED / UC    EKG   Radiology No results found.  Procedures Procedures (including critical care time)  Medications Ordered in UC Medications - No data to display  Initial Impression / Assessment and Plan / UC Course  I have reviewed the triage vital signs and the nursing notes.  Pertinent labs & imaging results that were available during my care of the patient were reviewed by me and considered in my medical decision making (see chart for details).     This patient is a very pleasant 40 y.o. year old female presenting with viral pharyngitis. Today this pt is afebrile nontachycardic nontachypneic, oxygenating well on room air, no wheezes rhonchi or rales.   Rapid strep negative, culture sent Covid PCR sent.   Viscous lidocaine, low-dose prednisone.   ED return precautions discussed. Patient verbalizes understanding and agreement.    Final Clinical Impressions(s) / UC Diagnoses   Final diagnoses:  Viral pharyngitis  Encounter for screening for COVID-19  Screening for streptococcal infection     Discharge Instructions      -For sore throat, use lidocaine mouthwash up to every 4 hours. Make sure not to  eat for at least 1 hour after using this, as your mouth will be very numb and you could bite yourself. -Prednisone 20mg  (1 pill) with breakfast or lunch x5 days, this medication can give you energy -Tylenol/ibuprofen -With a virus, you're typically contagious for 5-7 days, or as long as you're having fevers.     ED Prescriptions     Medication Sig Dispense Auth. Provider   lidocaine (XYLOCAINE) 2 % solution Use as directed 15 mLs in the mouth or  throat as needed for mouth pain. 100 mL , PA-C   predniSONE (DELTASONE) 20 MG tablet Take 1 tablet (20 mg total) by mouth daily for 5 days. 5 tablet Rhys Martini, PA-C      PDMP not reviewed this encounter.   Rhys Martini, PA-C 12/19/20 1439

## 2020-12-19 NOTE — Discharge Instructions (Addendum)
-  For sore throat, use lidocaine mouthwash up to every 4 hours. Make sure not to eat for at least 1 hour after using this, as your mouth will be very numb and you could bite yourself. -Prednisone 20mg  (1 pill) with breakfast or lunch x5 days, this medication can give you energy -Tylenol/ibuprofen -With a virus, you're typically contagious for 5-7 days, or as long as you're having fevers.

## 2020-12-22 LAB — CULTURE, GROUP A STREP (THRC)

## 2020-12-29 ENCOUNTER — Other Ambulatory Visit: Payer: Self-pay

## 2021-01-04 ENCOUNTER — Other Ambulatory Visit: Payer: Self-pay

## 2021-01-08 ENCOUNTER — Ambulatory Visit: Payer: Medicaid Other

## 2021-01-26 ENCOUNTER — Other Ambulatory Visit: Payer: Self-pay

## 2021-02-05 ENCOUNTER — Ambulatory Visit: Payer: Self-pay | Admitting: *Deleted

## 2021-02-05 NOTE — Telephone Encounter (Signed)
Patient called in to reschedule her appointment for her Depo shot and wanted to inform Dr Alvis Lemmings that she have had a headache for a few days due to her BP being elevated would like to be seen ASAP but nothing available. Please advise can be reached at  Ph# [phone]      Reason for Disposition . Systolic BP  >= 160 OR Diastolic >= 100  Answer Assessment - Initial Assessment Questions 1. BLOOD PRESSURE: "What is the blood pressure?" "Did you take at least two measurements 5 minutes apart?"     141/102 this AM. During call 137/89 prior to taking meds 2. ONSET: "When did you take your blood pressure?"     This AM 3. HOW: "How did you obtain the blood pressure?" (e.g., visiting nurse, automatic home BP monitor)     Home monitor, arm 4. HISTORY: "Do you have a history of high blood pressure?"     yes 5. MEDICATIONS: "Are you taking any medications for blood pressure?" "Have you missed any doses recently?"     No 6. OTHER SYMPTOMS: "Do you have any symptoms?" (e.g., headache, chest pain, blurred vision, difficulty breathing, weakness)     Headache, 8/10, IBU helps for a little. Leg pain, h/o knee pain. "May be why I have a headache."  Protocols used: Blood Pressure - High-A-AH

## 2021-02-05 NOTE — Telephone Encounter (Signed)
Pt reports BP elevated, 141/102 this Am, prior to taking meds. States plans on taking meds after NT call. Reports BP few days ago 151/107. Asked to check during call, 137/89  HR 68. Home monitor, arm cuff. States did a lot of waking Saturday and is having increased right knee pain "May be raising my BP."  Reports headache 8/10, Has been taking IBU "That helps for a while." Advised to hold IBU, try tylenol if able to take tylenol. No other associated symptoms. Denies visual changes, no weakness. No availability within protocol timeframe. Assured pt NT would route to practic for PCPs review and final disposition. Advised ED for worsening headache, unilateral weakness, visual changes, speech difficulties. Pt verbalizes understanding.  Please advise: [phone]

## 2021-02-06 NOTE — Telephone Encounter (Signed)
Please schedule an appointment.  Cari might have openings on her schedule. Thanks.

## 2021-02-06 NOTE — Telephone Encounter (Signed)
Called pt unable to reach or leave VM at this time. VM is full

## 2021-02-07 ENCOUNTER — Ambulatory Visit: Payer: Medicaid Other

## 2021-02-08 ENCOUNTER — Other Ambulatory Visit: Payer: Self-pay

## 2021-02-09 ENCOUNTER — Other Ambulatory Visit: Payer: Self-pay

## 2021-02-21 ENCOUNTER — Ambulatory Visit: Payer: Medicaid Other | Admitting: Family Medicine

## 2021-02-26 ENCOUNTER — Other Ambulatory Visit: Payer: Self-pay | Admitting: Family Medicine

## 2021-02-26 ENCOUNTER — Ambulatory Visit: Payer: Medicaid Other | Attending: Family Medicine

## 2021-02-26 ENCOUNTER — Other Ambulatory Visit: Payer: Self-pay

## 2021-02-26 DIAGNOSIS — Z3202 Encounter for pregnancy test, result negative: Secondary | ICD-10-CM

## 2021-02-26 DIAGNOSIS — I10 Essential (primary) hypertension: Secondary | ICD-10-CM

## 2021-02-26 DIAGNOSIS — Z3042 Encounter for surveillance of injectable contraceptive: Secondary | ICD-10-CM | POA: Diagnosis not present

## 2021-02-26 LAB — POCT URINE PREGNANCY: Preg Test, Ur: NEGATIVE

## 2021-02-26 MED ORDER — MEDROXYPROGESTERONE ACETATE 150 MG/ML IM SUSP
150.0000 mg | Freq: Once | INTRAMUSCULAR | Status: AC
  Administered 2021-02-26: 150 mg via INTRAMUSCULAR

## 2021-02-26 MED ORDER — LOSARTAN POTASSIUM 50 MG PO TABS
ORAL_TABLET | Freq: Every day | ORAL | 2 refills | Status: DC
  Filled 2021-02-26: qty 30, 30d supply, fill #0
  Filled 2021-03-27: qty 30, 30d supply, fill #1
  Filled 2021-04-27: qty 30, 30d supply, fill #0

## 2021-02-26 NOTE — Telephone Encounter (Signed)
Requested Prescriptions  Pending Prescriptions Disp Refills  . losartan (COZAAR) 50 MG tablet 30 tablet 2    Sig: TAKE 1 TABLET (50 MG TOTAL) BY MOUTH DAILY.     Cardiovascular:  Angiotensin Receptor Blockers Failed - 02/26/2021  4:12 PM      Failed - Cr in normal range and within 180 days    Creat  Date Value Ref Range Status  04/22/2014 0.59 0.50 - 1.10 mg/dL Final   Creatinine, Ser  Date Value Ref Range Status  07/27/2020 0.65 0.57 - 1.00 mg/dL Final   Creatinine, Urine  Date Value Ref Range Status  02/06/2015 41.00 mg/dL Final         Failed - K in normal range and within 180 days    Potassium  Date Value Ref Range Status  07/27/2020 3.8 3.5 - 5.2 mmol/L Final         Failed - Valid encounter within last 6 months    Recent Outpatient Visits          7 months ago Prediabetes   [facility] Hoy Register, MD   1 year ago Right ear pain   [facility] Fulp, [place], MD   1 year ago Upper respiratory tract infection, unspecified type   [facility] [place], [place], [place]   1 year ago No-show for appointment   [facility] Cain Saupe, MD   1 year ago Dysfunction of right eustachian tube   [facility] Hoy Register, MD      Future Appointments            In 2 months Hoy Register, MD [facility]           Passed - Patient is not pregnant      Passed - Last BP in normal range    BP Readings from Last 1 Encounters:  12/19/20 131/81

## 2021-02-27 ENCOUNTER — Other Ambulatory Visit: Payer: Self-pay

## 2021-02-27 ENCOUNTER — Encounter (HOSPITAL_COMMUNITY): Payer: Self-pay

## 2021-02-27 ENCOUNTER — Ambulatory Visit (HOSPITAL_COMMUNITY)
Admission: EM | Admit: 2021-02-27 | Discharge: 2021-02-27 | Disposition: A | Discharge status: Home / Self Care | Payer: Medicaid Other | Attending: Emergency Medicine | Admitting: Emergency Medicine

## 2021-02-27 DIAGNOSIS — J111 Influenza due to unidentified influenza virus with other respiratory manifestations: Secondary | ICD-10-CM | POA: Diagnosis not present

## 2021-02-27 MED ORDER — OSELTAMIVIR PHOSPHATE 75 MG PO CAPS: 75.0000 mg | ORAL_CAPSULE | Freq: Two times a day (BID) | ORAL | 0 refills | Status: DC

## 2021-02-27 MED ORDER — AEROCHAMBER PLUS MISC: 2 refills | Status: DC

## 2021-02-27 MED ORDER — FLUTICASONE PROPIONATE 50 MCG/ACT NA SUSP: 2.0000 | Freq: Every day | NASAL | 0 refills | Status: DC

## 2021-02-27 MED ORDER — IBUPROFEN 600 MG PO TABS: 600.0000 mg | ORAL_TABLET | Freq: Four times a day (QID) | ORAL | 0 refills | Status: DC | PRN

## 2021-02-27 MED ORDER — ALBUTEROL SULFATE HFA 108 (90 BASE) MCG/ACT IN AERS: 1.0000 | INHALATION_SPRAY | RESPIRATORY_TRACT | 0 refills | Status: DC | PRN

## 2021-02-27 MED ORDER — PROMETHAZINE-DM 6.25-15 MG/5ML PO SYRP: 5.0000 mL | ORAL_SOLUTION | Freq: Four times a day (QID) | ORAL | 0 refills | Status: DC | PRN

## 2021-02-27 NOTE — ED Provider Notes (Signed)
HPI  SUBJECTIVE:  Crystal Burns is a 40 y.o. female who presents with 3 days of fatigue, nasal congestion, cough, wheeze, postnasal drip, sore throat.  She is unable to sleep at night secondary to the cough.  She has chest soreness secondary to the cough.  No fevers, body aches, headaches, rhinorrhea, and shortness of breath, nausea, vomiting, diarrhea, abdominal pain.  Daughter, who has identical symptoms, tested positive for influenza A here today.  She has been using her albuterol nebs, inhaler and taking Alka-Seltzer cold plus.  The albuterol nebs help.  No aggravating factors.  Last dose of Alka-Seltzer cold was within 6 hours of evaluation.  She got the second COVID vaccine, did not get this years flu vaccine.  She was on antibiotics earlier this month for a dental extraction.  She has a past medical history of asthma, no admissions or recent steroid use, hypertension, COVID x2, diabetes.  LMP: Irregular.  She is on Depo.  She had a negative pregnancy test yesterday.  [facility]  Past Medical History:  Diagnosis Date   Asthma    2010-only bothers pt during bad cold   Depression    2010-had when mother passed away   History of gonorrhea    Hypertension    2004   Obesity    Pre-diabetes    Preeclampsia complicating hypertension 01/21/2015    Past Surgical History:  Procedure Laterality Date   CHOLECYSTECTOMY     DILATION AND CURETTAGE OF UTERUS     WISDOM TOOTH EXTRACTION      Family History  Problem Relation Age of Onset   Breast cancer Mother    Hypertension Mother    Diabetes Mother    Cancer Mother        Breast    Heart attack Mother 32   Hypertension Father    Diabetes Father    Hyperlipidemia Father    Arthritis Other    Asthma Other    Breast cancer Other    Heart failure Other    Congenital heart disease Other    Depression Other    Heart attack Other     Social History   Tobacco Use   Smoking status: Former     Types: Cigarettes    Quit date: 10/31/2010    Years since quitting: 10.3   Smokeless tobacco: Never  Vaping Use   Vaping Use: Never used  Substance Use Topics   Alcohol use: No    Alcohol/week: 0.0 standard drinks   Drug use: No    No current facility-administered medications for this encounter.  Current Outpatient Medications:    albuterol (VENTOLIN HFA) 108 (90 Base) MCG/ACT inhaler, Inhale 1-2 puffs into the lungs every 4 (four) hours as needed for wheezing or shortness of breath., Disp: 1 each, Rfl: 0   fluticasone (FLONASE) 50 MCG/ACT nasal spray, Place 2 sprays into both nostrils daily., Disp: 16 g, Rfl: 0   ibuprofen (ADVIL) 600 MG tablet, Take 1 tablet (600 mg total) by mouth every 6 (six) hours as needed., Disp: 30 tablet, Rfl: 0   Iron, Ferrous Sulfate, 325 (65 Fe) MG TABS, Take 325 mg by mouth daily., Disp: 60 tablet, Rfl: 3   losartan (COZAAR) 50 MG tablet, TAKE 1 TABLET (50 MG TOTAL) BY MOUTH DAILY., Disp: 30 tablet, Rfl: 2   oseltamivir (TAMIFLU) 75 MG capsule, Take 1 capsule (75 mg total) by mouth 2 (two) times daily. X 5 days, Disp: 10 capsule, Rfl: 0  promethazine-dextromethorphan (PROMETHAZINE-DM) 6.25-15 MG/5ML syrup, Take 5 mLs by mouth 4 (four) times daily as needed for cough., Disp: 118 mL, Rfl: 0   Spacer/Aero-Holding Chambers (AEROCHAMBER PLUS) inhaler, Use with inhaler, Disp: 1 each, Rfl: 2   diclofenac Sodium (VOLTAREN) 1 % GEL, Apply 4 g topically 4 (four) times daily., Disp: 150 g, Rfl: 0   hydrochlorothiazide (HYDRODIURIL) 25 MG tablet, TAKE 1 TABLET (25 MG TOTAL) BY MOUTH DAILY., Disp: 30 tablet, Rfl: 6   metFORMIN (GLUCOPHAGE) 500 MG tablet, TAKE 2 TABLETS (1,000 MG TOTAL) BY MOUTH 2 (TWO) TIMES DAILY WITH A MEAL., Disp: 4 tablet, Rfl: 0   metFORMIN (GLUCOPHAGE) 500 MG tablet, TAKE 2 TABLETS (1,000 MG TOTAL) BY MOUTH 2 (TWO) TIMES DAILY WITH A MEAL., Disp: 120 tablet, Rfl: 6   metroNIDAZOLE (METROGEL VAGINAL) 0.75 % vaginal gel, Place 1 Applicatorful  vaginally at bedtime., Disp: 70 g, Rfl: 0   sodium chloride (OCEAN) 0.65 % SOLN nasal spray, Place 1 spray into both nostrils as needed for congestion., Disp: 104 mL, Rfl: 0  Allergies  Allergen Reactions   Bee Venom Hives and Swelling   Lisinopril Shortness Of Breath and Cough   Shellfish Allergy Anaphylaxis     ROS  As noted in HPI.   Physical Exam  BP (!) 149/100 (BP Location: Left Arm)   Pulse 73   Temp 98.6 F (37 C) (Oral)   Resp 18   SpO2 97%   Constitutional: Well developed, well nourished, no acute distress Eyes:  EOMI, conjunctiva normal bilaterally HENT: Normocephalic, atraumatic,mucus membranes moist. positive nasal congestion.  No postnasal drip. Respiratory: Normal inspiratory effort, lungs clear bilaterally, good air movement Cardiovascular: Normal rate, regular rhythm, no murmurs rubs or gallops. GI: nondistended skin: No rash, skin intact Musculoskeletal: no deformities Neurologic: Alert & oriented x 3, no focal neuro deficits Psychiatric: Speech and behavior appropriate   ED Course   Medications - No data to display  No orders of the defined types were placed in this encounter.   No results found for this or any previous visit (from the past 24 hour(s)). No results found.  ED Clinical Impression  1. Influenza      ED Assessment/Plan  Patient's daughter is lab confirmed influenza A positive.  Given history of asthma, patient is at high risk of complications from influenza, so  will send home with Tamiflu.  Flonase, Promethazine DM, Tylenol/ibuprofen, Mucinex, will refill her albuterol inhaler and prescribe a spacer as well.  Work note.  Discussed MDM, treatment plan, and plan for follow-up with patient. Discussed sn/sx that should prompt return to the ED. patient agrees with plan.   Meds ordered this encounter  Medications   oseltamivir (TAMIFLU) 75 MG capsule    Sig: Take 1 capsule (75 mg total) by mouth 2 (two) times daily. X 5 days     Dispense:  10 capsule    Refill:  0   ibuprofen (ADVIL) 600 MG tablet    Sig: Take 1 tablet (600 mg total) by mouth every 6 (six) hours as needed.    Dispense:  30 tablet    Refill:  0   albuterol (VENTOLIN HFA) 108 (90 Base) MCG/ACT inhaler    Sig: Inhale 1-2 puffs into the lungs every 4 (four) hours as needed for wheezing or shortness of breath.    Dispense:  1 each    Refill:  0   fluticasone (FLONASE) 50 MCG/ACT nasal spray    Sig: Place 2 sprays into both nostrils  daily.    Dispense:  16 g    Refill:  0   Spacer/Aero-Holding Chambers (AEROCHAMBER PLUS) inhaler    Sig: Use with inhaler    Dispense:  1 each    Refill:  2    Please educate patient on use   promethazine-dextromethorphan (PROMETHAZINE-DM) 6.25-15 MG/5ML syrup    Sig: Take 5 mLs by mouth 4 (four) times daily as needed for cough.    Dispense:  118 mL    Refill:  0      *This clinic note was created using Scientist, clinical (histocompatibility and immunogenetics). Therefore, there may be occasional mistakes despite careful proofreading.  ?    Domenick Gong, MD 02/28/21 [phone]

## 2021-02-27 NOTE — ED Triage Notes (Signed)
Pt presents with sore throat, headache, cough, chills, fever, and fatigue X 3 days.

## 2021-02-27 NOTE — Discharge Instructions (Addendum)
2 puffs from albuterol inhaler using your spacer every 4-6 hours as needed for coughing, wheezing.  Mucinex, finish the Tamiflu, Flonase, saline nasal irrigation with a Lloyd Huger Med rinse and distilled water as often as you want.  Take 600 mg of ibuprofen, 1000 mg of Tylenol together 3-4 times a day as needed.  Promethazine DM cough syrup.  Mask for the next 5 days

## 2021-03-16 ENCOUNTER — Other Ambulatory Visit: Payer: Self-pay

## 2021-03-27 ENCOUNTER — Other Ambulatory Visit: Payer: Self-pay

## 2021-04-03 ENCOUNTER — Ambulatory Visit (HOSPITAL_COMMUNITY)
Admission: EM | Admit: 2021-04-03 | Discharge: 2021-04-03 | Disposition: A | Discharge status: Home / Self Care | Payer: Medicaid Other | Attending: Student | Admitting: Student

## 2021-04-03 ENCOUNTER — Other Ambulatory Visit: Payer: Self-pay

## 2021-04-03 ENCOUNTER — Encounter (HOSPITAL_COMMUNITY): Payer: Self-pay | Admitting: Emergency Medicine

## 2021-04-03 ENCOUNTER — Ambulatory Visit: Payer: Self-pay

## 2021-04-03 DIAGNOSIS — J069 Acute upper respiratory infection, unspecified: Secondary | ICD-10-CM

## 2021-04-03 DIAGNOSIS — H6591 Unspecified nonsuppurative otitis media, right ear: Secondary | ICD-10-CM | POA: Diagnosis not present

## 2021-04-03 DIAGNOSIS — J4520 Mild intermittent asthma, uncomplicated: Secondary | ICD-10-CM | POA: Diagnosis not present

## 2021-04-03 DIAGNOSIS — R7303 Prediabetes: Secondary | ICD-10-CM | POA: Diagnosis not present

## 2021-04-03 MED ORDER — FLUTICASONE PROPIONATE 50 MCG/ACT NA SUSP: 2.0000 | Freq: Every day | NASAL | 2 refills | Status: DC

## 2021-04-03 MED ORDER — PREDNISONE 20 MG PO TABS: 40.0000 mg | ORAL_TABLET | Freq: Every day | ORAL | 0 refills | Status: AC

## 2021-04-03 NOTE — Telephone Encounter (Signed)
Pt called, just left UC d/t having ear infection and URI. Pt was seen and prescribed meds. No triage was needed.   Summary: ear pain, sore throat, congestion   Pt calling in experiencing  right ear pain, sore throat, and congestion since Friday 12/23. Seeking clinical advise, so appts avail until 2/20.  [phone]

## 2021-04-03 NOTE — ED Triage Notes (Signed)
Pt presents with cough, body aches, headache, and sore throat xs 5 days.

## 2021-04-03 NOTE — Discharge Instructions (Addendum)
-  Prednisone, 2 pills taken at the same time for 5 days in a row.  Try taking this earlier in the day as it can give you energy. Avoid NSAIDs like ibuprofen and alleve while taking this medication as they can increase your risk of stomach upset and even GI bleeding when in combination with a steroid. You can continue tylenol (acetaminophen) up to 1000mg  3x daily. -Flonase nasal steroid: place 2 sprays into both nostrils in the morning and at bedtime for at least 7 days. Continue for longer if this is helping. -Albuterol inhaler as needed for cough, wheezing, shortness of breath, 1 to 2 puffs every 6 hours as needed. -With a virus, you're typically contagious for 5-7 days, or as long as you're having fevers.

## 2021-04-03 NOTE — ED Provider Notes (Signed)
[facility]    CSN: [id number] Arrival date & time: 04/03/21  1711      History   Chief Complaint Chief Complaint  Patient presents with   Sore Throat   Cough   Headache   Otalgia    right    HPI FADIA WIERMAN is a 40 y.o. female presenting with 5 days of viral symptoms. Medical history asthma that only flares during viral syndromes.  Describes sore throat, cough, throbbing headaches, right otalgia without dizziness, tinnitus, hearing changes. Cough is productive of yellow sputum. Denies SOB, CP. Daughter with similar symptoms. Has albuterol inhaler and nebulizer, hasn't attempted this at home but plans to tonight. Generalized malaise. Denies n/v/d/c, SOB, CP, fevers. Taking BP medications as directed, denies Has, dizziness, SOB, CP.  HPI  Past Medical History:  Diagnosis Date   Asthma    2010-only bothers pt during bad cold   Depression    2010-had when mother passed away   History of gonorrhea    Hypertension    2004   Obesity    Pre-diabetes    Preeclampsia complicating hypertension 01/21/2015    Patient Active Problem List   Diagnosis Date Noted   Bilateral primary osteoarthritis of knee 02/28/2020   Precordial chest pain 05/03/2019   Educated about COVID-19 virus infection 05/03/2019   Hoarseness of voice 03/17/2018   Gastroesophageal reflux disease without esophagitis 03/17/2018   Supervision of normal pregnancy, antepartum 12/15/2017   Acute medial meniscus tear of right knee 02/10/2017   Vitamin D deficiency 07/10/2015   Anxiety 06/20/2015   Costochondritis 05/18/2015   Primary osteoarthritis of right knee 04/22/2014   Essential hypertension 07/18/2012   LUMBOSACRAL STRAIN, ACUTE 07/05/2008   Morbid obesity ([place]) 02/24/2008   Asthma with exacerbation 02/24/2008    Past Surgical History:  Procedure Laterality Date   CHOLECYSTECTOMY     DILATION AND CURETTAGE OF UTERUS     WISDOM TOOTH EXTRACTION      OB History     Gravida   11   Para  7   Term  7   Preterm  0   AB  4   Living  6      SAB  4   IAB  0   Ectopic  0   Multiple  0   Live Births  7            Home Medications    Prior to Admission medications   Medication Sig Start Date End Date Taking? Authorizing Provider  fluticasone (FLONASE) 50 MCG/ACT nasal spray Place 2 sprays into both nostrils daily. 04/03/21  Yes Hazel Sams, PA-C  Iron, Ferrous Sulfate, 325 (65 Fe) MG TABS Take 325 mg by mouth daily. 07/28/20   Charlott Rakes, MD  predniSONE (DELTASONE) 20 MG tablet Take 2 tablets (40 mg total) by mouth daily for 5 days. Take with breakfast or lunch. Avoid NSAIDs (ibuprofen, etc) while taking this medication. 04/03/21 04/08/21 Yes Hazel Sams, PA-C  albuterol (VENTOLIN HFA) 108 (90 Base) MCG/ACT inhaler Inhale 1-2 puffs into the lungs every 4 (four) hours as needed for wheezing or shortness of breath. 02/27/21   Melynda Ripple, MD  diclofenac Sodium (VOLTAREN) 1 % GEL Apply 4 g topically 4 (four) times daily. 09/25/20   Lamptey, Myrene Galas, MD  hydrochlorothiazide (HYDRODIURIL) 25 MG tablet TAKE 1 TABLET (25 MG TOTAL) BY MOUTH DAILY. 07/27/20 07/27/21  Charlott Rakes, MD  ibuprofen (ADVIL) 600 MG tablet Take 1 tablet (600 mg  total) by mouth every 6 (six) hours as needed. 02/27/21   Melynda Ripple, MD  losartan (COZAAR) 50 MG tablet TAKE 1 TABLET (50 MG TOTAL) BY MOUTH DAILY. 02/26/21 02/26/22  Charlott Rakes, MD  metFORMIN (GLUCOPHAGE) 500 MG tablet TAKE 2 TABLETS (1,000 MG TOTAL) BY MOUTH 2 (TWO) TIMES DAILY WITH A MEAL. 07/24/20 07/24/21  Charlott Rakes, MD  metFORMIN (GLUCOPHAGE) 500 MG tablet TAKE 2 TABLETS (1,000 MG TOTAL) BY MOUTH 2 (TWO) TIMES DAILY WITH A MEAL. 07/27/20 07/27/21  Charlott Rakes, MD  metroNIDAZOLE (METROGEL VAGINAL) 0.75 % vaginal gel Place 1 Applicatorful vaginally at bedtime. 08/02/20   Charlott Rakes, MD  oseltamivir (TAMIFLU) 75 MG capsule Take 1 capsule (75 mg total) by mouth 2 (two) times daily. X 5  days 02/27/21   Melynda Ripple, MD  promethazine-dextromethorphan (PROMETHAZINE-DM) 6.25-15 MG/5ML syrup Take 5 mLs by mouth 4 (four) times daily as needed for cough. 02/27/21   Melynda Ripple, MD  sodium chloride (OCEAN) 0.65 % SOLN nasal spray Place 1 spray into both nostrils as needed for congestion. 09/25/20   Lamptey, Myrene Galas, MD  Spacer/Aero-Holding Chambers (AEROCHAMBER PLUS) inhaler Use with inhaler 02/27/21   Melynda Ripple, MD    Family History Family History  Problem Relation Age of Onset   Breast cancer Mother    Hypertension Mother    Diabetes Mother    Cancer Mother        Breast    Heart attack Mother 67   Hypertension Father    Diabetes Father    Hyperlipidemia Father    Arthritis Other    Asthma Other    Breast cancer Other    Heart failure Other    Congenital heart disease Other    Depression Other    Heart attack Other     Social History Social History   Tobacco Use   Smoking status: Former    Types: Cigarettes    Quit date: 10/31/2010    Years since quitting: 10.4   Smokeless tobacco: Never  Vaping Use   Vaping Use: Never used  Substance Use Topics   Alcohol use: No    Alcohol/week: 0.0 standard drinks   Drug use: No     Allergies   Bee venom, Lisinopril, and Shellfish allergy   Review of Systems Review of Systems  Constitutional:  Negative for appetite change, chills and fever.  HENT:  Positive for congestion and ear pain. Negative for rhinorrhea, sinus pressure, sinus pain and sore throat.   Eyes:  Negative for redness and visual disturbance.  Respiratory:  Positive for cough. Negative for chest tightness, shortness of breath and wheezing.   Cardiovascular:  Negative for chest pain and palpitations.  Gastrointestinal:  Negative for abdominal pain, constipation, diarrhea, nausea and vomiting.  Genitourinary:  Negative for dysuria, frequency and urgency.  Musculoskeletal:  Negative for myalgias.  Neurological:  Negative for  dizziness, weakness and headaches.  Psychiatric/Behavioral:  Negative for confusion.   All other systems reviewed and are negative.   Physical Exam Triage Vital Signs ED Triage Vitals  Enc Vitals Group     BP 04/03/21 1812 (!) 147/91     Pulse Rate 04/03/21 1812 69     Resp 04/03/21 1812 18     Temp 04/03/21 1812 98.1 F (36.7 C)     Temp Source 04/03/21 1812 Oral     SpO2 04/03/21 1812 100 %     Weight --      Height --  Head Circumference --      Peak Flow --      Pain Score 04/03/21 1810 7     Pain Loc --      Pain Edu? --      Excl. in GC? --    No data found.  Updated Vital Signs BP (!) 147/91 (BP Location: Left Wrist)    Pulse 69    Temp 98.1 F (36.7 C) (Oral)    Resp 18    SpO2 100%   Visual Acuity Right Eye Distance:   Left Eye Distance:   Bilateral Distance:    Right Eye Near:   Left Eye Near:    Bilateral Near:     Physical Exam Vitals reviewed.  Constitutional:      General: She is not in acute distress.    Appearance: Normal appearance. She is not ill-appearing.  HENT:     Head: Normocephalic and atraumatic.     Right Ear: Ear canal and external ear normal. Tenderness present. A middle ear effusion is present. There is no impacted cerumen. Tympanic membrane is not perforated, erythematous, retracted or bulging.     Left Ear: Tympanic membrane, ear canal and external ear normal. No tenderness.  No middle ear effusion. There is no impacted cerumen. Tympanic membrane is not perforated, erythematous, retracted or bulging.     Ears:     Comments: No lymphadenopathy     Nose: Nose normal. No congestion.     Mouth/Throat:     Mouth: Mucous membranes are moist.     Pharynx: Uvula midline. No oropharyngeal exudate or posterior oropharyngeal erythema.  Eyes:     Extraocular Movements: Extraocular movements intact.     Pupils: Pupils are equal, round, and reactive to light.  Cardiovascular:     Rate and Rhythm: Normal rate and regular rhythm.      Heart sounds: Normal heart sounds.  Pulmonary:     Effort: Pulmonary effort is normal.     Breath sounds: Normal breath sounds. No decreased breath sounds, wheezing, rhonchi or rales.  Abdominal:     Palpations: Abdomen is soft.     Tenderness: There is no abdominal tenderness. There is no guarding or rebound.  Lymphadenopathy:     Cervical: No cervical adenopathy.     Right cervical: No superficial cervical adenopathy.    Left cervical: No superficial cervical adenopathy.  Neurological:     General: No focal deficit present.     Mental Status: She is alert and oriented to person, place, and time.  Psychiatric:        Mood and Affect: Mood normal.        Behavior: Behavior normal.        Thought Content: Thought content normal.        Judgment: Judgment normal.     UC Treatments / Results  Labs (all labs ordered are listed, but only abnormal results are displayed) Labs Reviewed - No data to display  EKG   Radiology No results found.  Procedures Procedures (including critical care time)  Medications Ordered in UC Medications - No data to display  Initial Impression / Assessment and Plan / UC Course  I have reviewed the triage vital signs and the nursing notes.  Pertinent labs & imaging results that were available during my care of the patient were reviewed by me and considered in my medical decision making (see chart for details).     This patient is a very pleasant 40 y.o.  year old female presenting with mid ear effusion following virus. Today this pt is afebrile nontachycardic nontachypneic, oxygenating well on room air, no wheezes rhonchi or rales. History asthma, has not required inhaler during present illness, but states that she has this at home and declines refills. Injection contraception.   Covid and influenza testing deferred given duration symptoms (5 days). Low-dose prednisone, flonase. She is a prediabetic.   Work note provided. ED return precautions  discussed. Patient verbalizes understanding and agreement.    Final Clinical Impressions(s) / UC Diagnoses   Final diagnoses:  Middle ear effusion, right  Viral URI with cough  Prediabetes  Mild intermittent asthma without complication     Discharge Instructions      -Prednisone, 2 pills taken at the same time for 5 days in a row.  Try taking this earlier in the day as it can give you energy. Avoid NSAIDs like ibuprofen and alleve while taking this medication as they can increase your risk of stomach upset and even GI bleeding when in combination with a steroid. You can continue tylenol (acetaminophen) up to 1000mg  3x daily. -Flonase nasal steroid: place 2 sprays into both nostrils in the morning and at bedtime for at least 7 days. Continue for longer if this is helping. -Albuterol inhaler as needed for cough, wheezing, shortness of breath, 1 to 2 puffs every 6 hours as needed. -With a virus, you're typically contagious for 5-7 days, or as long as you're having fevers.       ED Prescriptions     Medication Sig Dispense Auth. Provider   predniSONE (DELTASONE) 20 MG tablet Take 2 tablets (40 mg total) by mouth daily for 5 days. Take with breakfast or lunch. Avoid NSAIDs (ibuprofen, etc) while taking this medication. 10 tablet Hazel Sams, PA-C   fluticasone [facility]) 50 MCG/ACT nasal spray Place 2 sprays into both nostrils daily. 15 mL Hazel Sams, PA-C      PDMP not reviewed this encounter.   Hazel Sams, PA-C 04/03/21 1849

## 2021-04-20 ENCOUNTER — Other Ambulatory Visit: Payer: Self-pay

## 2021-04-20 ENCOUNTER — Other Ambulatory Visit: Payer: Self-pay | Admitting: Family Medicine

## 2021-04-20 DIAGNOSIS — I10 Essential (primary) hypertension: Secondary | ICD-10-CM

## 2021-04-20 MED ORDER — HYDROCHLOROTHIAZIDE 25 MG PO TABS
ORAL_TABLET | Freq: Every day | ORAL | 0 refills | Status: DC
  Filled 2021-04-20: qty 30, 30d supply, fill #0

## 2021-04-20 NOTE — Telephone Encounter (Signed)
Patient called in to inform Dr Margarita Rana that she is completely out of her BP medication please send Rx to pharmacy today and call patient at Ph# [phone]

## 2021-04-20 NOTE — Telephone Encounter (Signed)
Requested Prescriptions  Pending Prescriptions Disp Refills   hydrochlorothiazide (HYDRODIURIL) 25 MG tablet 30 tablet 0    Sig: TAKE 1 TABLET (25 MG TOTAL) BY MOUTH DAILY.     Cardiovascular: Diuretics - Thiazide Failed - 04/20/2021  3:01 PM      Failed - Last BP in normal range    BP Readings from Last 1 Encounters:  04/03/21 (!) 147/91         Failed - Valid encounter within last 6 months    Recent Outpatient Visits          8 months ago Prediabetes   [facility] Vincent, Odette Horns, MD   1 year ago Right ear pain   [facility] Jacobus, [place], [place]   1 year ago Upper respiratory tract infection, unspecified type   [facility] [place], [place], [place]   1 year ago No-show for appointment   [facility] [place], [place], MD   1 year ago Dysfunction of right eustachian tube   [facility] Bennett, Odette Horns, MD      Future Appointments            In 1 week Hoy Register, MD [facility]           Passed - Ca in normal range and within 360 days    Calcium  Date Value Ref Range Status  07/27/2020 9.8 8.7 - 10.2 mg/dL Final   Calcium, Ion  Date Value Ref Range Status  05/27/2016 1.28 1.15 - 1.40 mmol/L Final         Passed - Cr in normal range and within 360 days    Creat  Date Value Ref Range Status  04/22/2014 0.59 0.50 - 1.10 mg/dL Final   Creatinine, Ser  Date Value Ref Range Status  07/27/2020 0.65 0.57 - 1.00 mg/dL Final   Creatinine, Urine  Date Value Ref Range Status  02/06/2015 41.00 mg/dL Final         Passed - K in normal range and within 360 days    Potassium  Date Value Ref Range Status  07/27/2020 3.8 3.5 - 5.2 mmol/L Final         Passed - Na in normal range and within 360 days    Sodium  Date Value Ref Range Status  07/27/2020 140 134 - 144 mmol/L Final

## 2021-04-24 ENCOUNTER — Other Ambulatory Visit: Payer: Self-pay

## 2021-04-24 ENCOUNTER — Ambulatory Visit: Payer: Self-pay | Admitting: *Deleted

## 2021-04-24 ENCOUNTER — Encounter (HOSPITAL_COMMUNITY): Payer: Self-pay

## 2021-04-24 ENCOUNTER — Ambulatory Visit (HOSPITAL_COMMUNITY)
Admission: EM | Admit: 2021-04-24 | Discharge: 2021-04-24 | Disposition: A | Discharge status: Home / Self Care | Payer: Medicaid Other | Attending: Family Medicine | Admitting: Family Medicine

## 2021-04-24 DIAGNOSIS — R052 Subacute cough: Secondary | ICD-10-CM

## 2021-04-24 DIAGNOSIS — J069 Acute upper respiratory infection, unspecified: Secondary | ICD-10-CM | POA: Diagnosis not present

## 2021-04-24 DIAGNOSIS — H66001 Acute suppurative otitis media without spontaneous rupture of ear drum, right ear: Secondary | ICD-10-CM

## 2021-04-24 MED ORDER — AMOXICILLIN-POT CLAVULANATE 875-125 MG PO TABS: 1.0000 | ORAL_TABLET | Freq: Two times a day (BID) | ORAL | 0 refills | Status: DC

## 2021-04-24 NOTE — Telephone Encounter (Signed)
Summary: ear discomfort   The patient's ears have caused them discomfort for roughly 2 days   The patient has noticed no fluid coming from them but is experiencing significant congestion   The patient would like to discuss further       Please contact when possible      Reason for Disposition  [1] SEVERE pain AND [2] not improved 2 hours after taking analgesic medication (e.g., ibuprofen or acetaminophen)  Answer Assessment - Initial Assessment Questions 1. LOCATION: "Which ear is involved?"     R ear- stopped up 2. ONSET: "When did the ear start hurting"      Since yesterday 3. SEVERITY: "How bad is the pain?"  (Scale 1-10; mild, moderate or severe)   - MILD (1-3): doesn't interfere with normal activities    - MODERATE (4-7): interferes with normal activities or awakens from sleep    - SEVERE (8-10): excruciating pain, unable to do any normal activities      moderate 4. URI SYMPTOMS: "Do you have a runny nose or cough?"     congestion 5. FEVER: "Do you have a fever?" If Yes, ask: "What is your temperature, how was it measured, and when did it start?"     no 6. CAUSE: "Have you been swimming recently?", "How often do you use Q-TIPS?", "Have you had any recent air travel or scuba diving?"     URI 7. OTHER SYMPTOMS: "Do you have any other symptoms?" (e.g., headache, stiff neck, dizziness, vomiting, runny nose, decreased hearing)     Headache, congestion 8. PREGNANCY: "Is there any chance you are pregnant?" "When was your last menstrual period?"     *No Answer*  Protocols used: Davina Poke

## 2021-04-24 NOTE — ED Triage Notes (Signed)
Pt presents with rt ear pain, congestion, and wheezing that began sunday

## 2021-04-24 NOTE — Telephone Encounter (Signed)
°  Chief Complaint: ear pain Symptoms: ear pain, congestion Frequency: started yesterday Pertinent Negatives: Patient denies fever Disposition: [] ED /[x] Urgent Care (no appt availability in office) / [] Appointment(In office/virtual)/ []  [facility]/ [] Home Care/ [] Refused Recommended Disposition /[] [facility]/ []  Follow-up with PCP Additional Notes: Advised virtual visit, mobile unit- patient prefers to be seen- UC

## 2021-04-24 NOTE — ED Provider Notes (Signed)
[facility]    CSN: [id number] Arrival date & time: 04/24/21  1111      History   Chief Complaint Chief Complaint  Patient presents with   Otalgia    rt   Nasal Congestion   Wheezing    HPI Crystal Burns is a 41 y.o. female.   Patient is here for uri symptoms.  2 days ago woke up with cough, sinus congestion, right ear pain.  No fevers.  Taking otc meds;  She feels weak.  Coughing up some sputum;  + wheezing, no sob.  She does have h/o asthma;   She does not smoke;  She does have an inhaler/neb at time, but has not needed to use it.   Past Medical History:  Diagnosis Date   Asthma    2010-only bothers pt during bad cold   Depression    2010-had when mother passed away   History of gonorrhea    Hypertension    2004   Obesity    Pre-diabetes    Preeclampsia complicating hypertension 01/21/2015    Patient Active Problem List   Diagnosis Date Noted   Bilateral primary osteoarthritis of knee 02/28/2020   Precordial chest pain 05/03/2019   Educated about COVID-19 virus infection 05/03/2019   Hoarseness of voice 03/17/2018   Gastroesophageal reflux disease without esophagitis 03/17/2018   Supervision of normal pregnancy, antepartum 12/15/2017   Acute medial meniscus tear of right knee 02/10/2017   Vitamin D deficiency 07/10/2015   Anxiety 06/20/2015   Costochondritis 05/18/2015   Primary osteoarthritis of right knee 04/22/2014   Essential hypertension 07/18/2012   LUMBOSACRAL STRAIN, ACUTE 07/05/2008   Morbid obesity ([facility]) 02/24/2008   Asthma with exacerbation 02/24/2008    Past Surgical History:  Procedure Laterality Date   CHOLECYSTECTOMY     DILATION AND CURETTAGE OF UTERUS     WISDOM TOOTH EXTRACTION      OB History     Gravida  11   Para  7   Term  7   Preterm  0   AB  4   Living  6      SAB  4   IAB  0   Ectopic  0   Multiple  0   Live Births  7            Home Medications    Prior to Admission  medications   Medication Sig Start Date End Date Taking? Authorizing Provider  Iron, Ferrous Sulfate, 325 (65 Fe) MG TABS Take 325 mg by mouth daily. Patient not taking: Reported on 04/24/2021 07/28/20   Hoy Register, MD  albuterol (VENTOLIN HFA) 108 (90 Base) MCG/ACT inhaler Inhale 1-2 puffs into the lungs every 4 (four) hours as needed for wheezing or shortness of breath. 02/27/21   Domenick Gong, MD  diclofenac Sodium (VOLTAREN) 1 % GEL Apply 4 g topically 4 (four) times daily. 09/25/20   Merrilee Jansky, MD  fluticasone (FLONASE) 50 MCG/ACT nasal spray Place 2 sprays into both nostrils daily. 04/03/21   Rhys Martini, PA-C  hydrochlorothiazide (HYDRODIURIL) 25 MG tablet TAKE 1 TABLET (25 MG TOTAL) BY MOUTH DAILY. 04/20/21 04/20/22  Hoy Register, MD  ibuprofen (ADVIL) 600 MG tablet Take 1 tablet (600 mg total) by mouth every 6 (six) hours as needed. Patient not taking: Reported on 04/24/2021 02/27/21   Domenick Gong, MD  losartan (COZAAR) 50 MG tablet TAKE 1 TABLET (50 MG TOTAL) BY MOUTH DAILY. 02/26/21 02/26/22  Newlin,  Enobong, MD  metFORMIN (GLUCOPHAGE) 500 MG tablet TAKE 2 TABLETS (1,000 MG TOTAL) BY MOUTH 2 (TWO) TIMES DAILY WITH A MEAL. 07/24/20 07/24/21  Hoy Register, MD  metFORMIN (GLUCOPHAGE) 500 MG tablet TAKE 2 TABLETS (1,000 MG TOTAL) BY MOUTH 2 (TWO) TIMES DAILY WITH A MEAL. 07/27/20 07/27/21  Hoy Register, MD  metroNIDAZOLE (METROGEL VAGINAL) 0.75 % vaginal gel Place 1 Applicatorful vaginally at bedtime. Patient not taking: Reported on 04/24/2021 08/02/20   Hoy Register, MD  oseltamivir (TAMIFLU) 75 MG capsule Take 1 capsule (75 mg total) by mouth 2 (two) times daily. X 5 days Patient not taking: Reported on 04/24/2021 02/27/21   Domenick Gong, MD  promethazine-dextromethorphan (PROMETHAZINE-DM) 6.25-15 MG/5ML syrup Take 5 mLs by mouth 4 (four) times daily as needed for cough. Patient not taking: Reported on 04/24/2021 02/27/21   Domenick Gong, MD  sodium  chloride (OCEAN) 0.65 % SOLN nasal spray Place 1 spray into both nostrils as needed for congestion. Patient not taking: Reported on 04/24/2021 09/25/20   Merrilee Jansky, MD  Spacer/Aero-Holding Chambers (AEROCHAMBER PLUS) inhaler Use with inhaler Patient not taking: Reported on 04/24/2021 02/27/21   Domenick Gong, MD    Family History Family History  Problem Relation Age of Onset   Breast cancer Mother    Hypertension Mother    Diabetes Mother    Cancer Mother        Breast    Heart attack Mother 51   Hypertension Father    Diabetes Father    Hyperlipidemia Father    Arthritis Other    Asthma Other    Breast cancer Other    Heart failure Other    Congenital heart disease Other    Depression Other    Heart attack Other     Social History Social History   Tobacco Use   Smoking status: Former    Types: Cigarettes    Quit date: 10/31/2010    Years since quitting: 10.4   Smokeless tobacco: Never  Vaping Use   Vaping Use: Never used  Substance Use Topics   Alcohol use: No    Alcohol/week: 0.0 standard drinks   Drug use: No     Allergies   Bee venom, Lisinopril, and Shellfish allergy   Review of Systems Review of Systems  Constitutional:  Positive for fatigue. Negative for fever.  HENT:  Positive for congestion, ear pain and sore throat.   Respiratory:  Positive for cough and wheezing. Negative for chest tightness.   Cardiovascular: Negative.   Gastrointestinal: Negative.     Physical Exam Triage Vital Signs ED Triage Vitals  Enc Vitals Group     BP 04/24/21 1120 (!) 150/88     Pulse Rate 04/24/21 1120 66     Resp 04/24/21 1120 14     Temp 04/24/21 1120 98.2 F (36.8 C)     Temp Source 04/24/21 1120 Oral     SpO2 04/24/21 1120 97 %     Weight --      Height --      Head Circumference --      Peak Flow --      Pain Score 04/24/21 1124 4     Pain Loc --      Pain Edu? --      Excl. in GC? --    No data found.  Updated Vital Signs BP (!)  150/88 (BP Location: Left Arm)    Pulse 66    Temp 98.2 F (36.8 C) (Oral)  Resp 14    SpO2 97%   Visual Acuity Right Eye Distance:   Left Eye Distance:   Bilateral Distance:    Right Eye Near:   Left Eye Near:    Bilateral Near:     Physical Exam Constitutional:      Appearance: Normal appearance.  HENT:     Head: Normocephalic and atraumatic.     Right Ear: A middle ear effusion is present. Tympanic membrane is erythematous.     Mouth/Throat:     Mouth: Mucous membranes are moist.     Pharynx: No posterior oropharyngeal erythema.  Cardiovascular:     Rate and Rhythm: Normal rate and regular rhythm.  Pulmonary:     Effort: Pulmonary effort is normal. No respiratory distress.     Breath sounds: Normal breath sounds. No wheezing.  Musculoskeletal:     Cervical back: Normal range of motion and neck supple. Tenderness present.  Lymphadenopathy:     Cervical: Cervical adenopathy present.  Neurological:     Mental Status: She is alert.     UC Treatments / Results  Labs (all labs ordered are listed, but only abnormal results are displayed) Labs Reviewed - No data to display  EKG   Radiology No results found.  Procedures Procedures (including critical care time)  Medications Ordered in UC Medications - No data to display  Initial Impression / Assessment and Plan / UC Course  I have reviewed the triage vital signs and the nursing notes.  Pertinent labs & imaging results that were available during my care of the patient were reviewed by me and considered in my medical decision making (see chart for details).  Patient seen for URI symptoms.  Dx with AOM today;  augmentin bid x 7 days;  otc supportive care as well;   Final Clinical Impressions(s) / UC Diagnoses   Final diagnoses:  Non-recurrent acute suppurative otitis media of right ear without spontaneous rupture of tympanic membrane  Subacute cough  Upper respiratory tract infection, unspecified type      Discharge Instructions      You were seen today for upper respiratory infection, and also have a right ear infection.  I have sent out an antibiotic for you today.  Please take this twice/day x 7 days.  You should also use over the counter zyrtec, sudafed, and/or flonase to help with your symptoms.  Motrin/ibuprofen for pain as well.     ED Prescriptions     Medication Sig Dispense Auth. Provider   amoxicillin-clavulanate (AUGMENTIN) 875-125 MG tablet Take 1 tablet by mouth every 12 (twelve) hours. 14 tablet Kolina Kube, MJannifer Franklin      PDMP not reviewed this encounter.   Jannifer FranklinPiontek, Marvena Tally, MD 04/24/21 1144

## 2021-04-24 NOTE — Discharge Instructions (Addendum)
You were seen today for upper respiratory infection, and also have a right ear infection.  I have sent out an antibiotic for you today.  Please take this twice/day x 7 days.  You should also use over the counter zyrtec, sudafed, and/or flonase to help with your symptoms.  Motrin/ibuprofen for pain as well.

## 2021-04-27 ENCOUNTER — Other Ambulatory Visit: Payer: Self-pay

## 2021-05-02 ENCOUNTER — Ambulatory Visit: Payer: Medicaid Other | Admitting: Family Medicine

## 2021-05-25 ENCOUNTER — Other Ambulatory Visit: Payer: Self-pay | Admitting: Family Medicine

## 2021-05-25 ENCOUNTER — Other Ambulatory Visit: Payer: Self-pay

## 2021-05-25 ENCOUNTER — Telehealth: Payer: Self-pay | Admitting: Family Medicine

## 2021-05-25 ENCOUNTER — Other Ambulatory Visit: Payer: Self-pay | Admitting: Pharmacist

## 2021-05-25 DIAGNOSIS — I10 Essential (primary) hypertension: Secondary | ICD-10-CM

## 2021-05-25 MED ORDER — LOSARTAN POTASSIUM 50 MG PO TABS
ORAL_TABLET | Freq: Every day | ORAL | 1 refills | Status: DC
  Filled 2021-05-25: qty 30, 30d supply, fill #0
  Filled 2021-06-25: qty 30, 30d supply, fill #1

## 2021-05-25 MED ORDER — HYDROCHLOROTHIAZIDE 25 MG PO TABS
ORAL_TABLET | Freq: Every day | ORAL | 1 refills | Status: DC
  Filled 2021-05-25: qty 30, 30d supply, fill #0
  Filled 2021-06-25: qty 30, 30d supply, fill #1

## 2021-05-25 NOTE — Telephone Encounter (Signed)
Patient called and advised Rx sent today to the pharmacy. She says she just received a call that they are ready and she's on the way to pick them up.

## 2021-05-25 NOTE — Telephone Encounter (Signed)
Patient is all out of these medsMedication Refill - Medication:hydrochlorothiazide (HYDRODIURIL) 25 MG tablet and losartan (COZAAR) 50 MG tablet  Has the patient contacted their pharmacy? yes (Agent: If no, request that the patient contact the pharmacy for the refill. If patient does not wish to contact the pharmacy document the reason why and proceed with request.) (Agent: If yes, when and what did the pharmacy advise?)contact pcp  Preferred Pharmacy (with phone number or street name):  [place] at [facility] Phone:  [phone]  Fax:  [phone]     Has the patient been seen for an appointment in the last year OR does the patient have an upcoming appointment? yes  Agent: Please be advised that RX refills may take up to 3 business days. We ask that you follow-up with your pharmacy.

## 2021-05-30 ENCOUNTER — Ambulatory Visit: Payer: Medicaid Other | Attending: Family Medicine

## 2021-06-11 ENCOUNTER — Other Ambulatory Visit: Payer: Self-pay

## 2021-06-11 ENCOUNTER — Ambulatory Visit (HOSPITAL_COMMUNITY)
Admission: EM | Admit: 2021-06-11 | Discharge: 2021-06-11 | Disposition: A | Discharge status: Home / Self Care | Payer: Medicaid Other | Attending: Internal Medicine | Admitting: Internal Medicine

## 2021-06-11 ENCOUNTER — Encounter (HOSPITAL_COMMUNITY): Payer: Self-pay | Admitting: *Deleted

## 2021-06-11 DIAGNOSIS — Z20822 Contact with and (suspected) exposure to covid-19: Secondary | ICD-10-CM | POA: Diagnosis not present

## 2021-06-11 DIAGNOSIS — R519 Headache, unspecified: Secondary | ICD-10-CM | POA: Diagnosis present

## 2021-06-11 DIAGNOSIS — H9201 Otalgia, right ear: Secondary | ICD-10-CM | POA: Diagnosis not present

## 2021-06-11 DIAGNOSIS — J111 Influenza due to unidentified influenza virus with other respiratory manifestations: Secondary | ICD-10-CM | POA: Diagnosis not present

## 2021-06-11 LAB — POC INFLUENZA A AND B ANTIGEN (URGENT CARE ONLY)
INFLUENZA A ANTIGEN, POC: NEGATIVE
INFLUENZA B ANTIGEN, POC: NEGATIVE

## 2021-06-11 LAB — POCT RAPID STREP A, ED / UC: Streptococcus, Group A Screen (Direct): NEGATIVE

## 2021-06-11 MED ORDER — BENZONATATE 100 MG PO CAPS
200.0000 mg | ORAL_CAPSULE | Freq: Three times a day (TID) | ORAL | 0 refills | Status: DC
  Filled 2021-06-11 – 2021-06-25 (×2): qty 40, 7d supply, fill #0

## 2021-06-11 MED ORDER — FLUTICASONE PROPIONATE 50 MCG/ACT NA SUSP
2.0000 | Freq: Every day | NASAL | 0 refills | Status: DC
  Filled 2021-06-11: qty 16, 30d supply, fill #0

## 2021-06-11 NOTE — ED Triage Notes (Signed)
T reports HA and body aches started yesterday. ?

## 2021-06-11 NOTE — Discharge Instructions (Addendum)
Your strep and flu test are negative. You do not have signs of a bacteria infection. ?Viral respiratory illness can last 2-3 week. If you get worse with a fever of 100.5 or more, come back.  ?Stay quarantined until you get your covid results.  ?Do saline nose rinses to clean out the nose congestion.  ? ?

## 2021-06-11 NOTE — ED Provider Notes (Signed)
?[facility] ? ? ? ?CSN: [id number] ?Arrival date & time: 06/11/21  1607 ? ? ?  ? ?History   ?Chief Complaint ?Chief Complaint  ?Patient presents with  ? Headache  ? Generalized Body Aches  ? Nasal Congestion  ? Cough  ? Otalgia  ?  Rt  ? ? ?HPI ?Crystal Burns is a 41 y.o. female who presents with URI and body aches and fatigue x 2 days. Today she could barely make it at work. Has R ear pain and popping. Denies chance of pregnancy. Last sexual encounter 1 y ago. She was sick last month and been well since.  ? ? ? ?Past Medical History:  ?Diagnosis Date  ? Asthma   ? 2010-only bothers pt during bad cold  ? Depression   ? 2010-had when mother passed away  ? History of gonorrhea   ? Hypertension   ? 2004  ? Obesity   ? Pre-diabetes   ? Preeclampsia complicating hypertension 01/21/2015  ? ? ?Patient Active Problem List  ? Diagnosis Date Noted  ? Bilateral primary osteoarthritis of knee 02/28/2020  ? Precordial chest pain 05/03/2019  ? Educated about COVID-19 virus infection 05/03/2019  ? Hoarseness of voice 03/17/2018  ? Gastroesophageal reflux disease without esophagitis 03/17/2018  ? Supervision of normal pregnancy, antepartum 12/15/2017  ? Acute medial meniscus tear of right knee 02/10/2017  ? Vitamin D deficiency 07/10/2015  ? Anxiety 06/20/2015  ? Costochondritis 05/18/2015  ? Primary osteoarthritis of right knee 04/22/2014  ? Essential hypertension 07/18/2012  ? LUMBOSACRAL STRAIN, ACUTE 07/05/2008  ? Morbid obesity (HCC) 02/24/2008  ? Asthma with exacerbation 02/24/2008  ? ? ?Past Surgical History:  ?Procedure Laterality Date  ? CHOLECYSTECTOMY    ? DILATION AND CURETTAGE OF UTERUS    ? WISDOM TOOTH EXTRACTION    ? ? ?OB History   ? ? Gravida  ?11  ? Para  ?7  ? Term  ?7  ? Preterm  ?0  ? AB  ?4  ? Living  ?6  ?  ? ? SAB  ?4  ? IAB  ?0  ? Ectopic  ?0  ? Multiple  ?0  ? Live Births  ?7  ?   ?  ?  ? ? ? ?Home Medications   ? ?Prior to Admission medications   ?Medication Sig Start Date End Date Taking?  Authorizing Provider  ?benzonatate (TESSALON) 100 MG capsule Take 2 capsules (200 mg total) by mouth 3 (three) times daily. 06/11/21  Yes Rodriguez-Southworth, Nettie Elm, PA-C  ?diclofenac Sodium (VOLTAREN) 1 % GEL Apply 4 g topically 4 (four) times daily. 09/25/20   Merrilee Jansky, MD  ?fluticasone (FLONASE) 50 MCG/ACT nasal spray Place 2 sprays into both nostrils daily. For drainage 06/11/21   Rodriguez-Southworth, Nettie Elm, PA-C  ?hydrochlorothiazide (HYDRODIURIL) 25 MG tablet TAKE 1 TABLET (25 MG TOTAL) BY MOUTH DAILY. 05/25/21 05/25/22  Hoy Register, MD  ?losartan (COZAAR) 50 MG tablet TAKE 1 TABLET (50 MG TOTAL) BY MOUTH DAILY. 05/25/21 05/25/22  Hoy Register, MD  ?metFORMIN (GLUCOPHAGE) 500 MG tablet TAKE 2 TABLETS (1,000 MG TOTAL) BY MOUTH 2 (TWO) TIMES DAILY WITH A MEAL. 07/24/20 07/24/21  Hoy Register, MD  ?metFORMIN (GLUCOPHAGE) 500 MG tablet TAKE 2 TABLETS (1,000 MG TOTAL) BY MOUTH 2 (TWO) TIMES DAILY WITH A MEAL. 07/27/20 07/27/21  Hoy Register, MD  ? ? ?Family History ?Family History  ?Problem Relation Age of Onset  ? Breast cancer Mother   ? Hypertension Mother   ?  Diabetes Mother   ? Cancer Mother   ?     Breast   ? Heart attack Mother 79  ? Hypertension Father   ? Diabetes Father   ? Hyperlipidemia Father   ? Arthritis Other   ? Asthma Other   ? Breast cancer Other   ? Heart failure Other   ? Congenital heart disease Other   ? Depression Other   ? Heart attack Other   ? ? ?Social History ?Social History  ? ?Tobacco Use  ? Smoking status: Former  ?  Types: Cigarettes  ?  Quit date: 10/31/2010  ?  Years since quitting: 10.6  ? Smokeless tobacco: Never  ?Vaping Use  ? Vaping Use: Never used  ?Substance Use Topics  ? Alcohol use: No  ?  Alcohol/week: 0.0 standard drinks  ? Drug use: No  ? ? ? ?Allergies   ?Bee venom, Lisinopril, and Shellfish allergy ? ? ?Review of Systems ?Review of Systems  ?Constitutional:  Positive for chills and fatigue. Negative for appetite change, diaphoresis and fever.  ?HENT:   Positive for congestion, ear pain, postnasal drip, rhinorrhea and sore throat. Negative for ear discharge.   ?Eyes:  Negative for discharge.  ?Respiratory:  Positive for cough.   ?Musculoskeletal:  Positive for myalgias.  ?Skin:  Negative for rash.  ?Neurological:  Positive for headaches.  ? ? ?Physical Exam ?Triage Vital Signs ?ED Triage Vitals  ?Enc Vitals Group  ?   BP 06/11/21 1755 134/74  ?   Pulse Rate 06/11/21 1755 73  ?   Resp 06/11/21 1755 20  ?   Temp 06/11/21 1755 98.2 ?F (36.8 ?C)  ?   Temp src --   ?   SpO2 06/11/21 1755 100 %  ?   Weight --   ?   Height --   ?   Head Circumference --   ?   Peak Flow --   ?   Pain Score 06/11/21 1752 10  ?   Pain Loc --   ?   Pain Edu? --   ?   Excl. in [place]? --   ? ?No data found. ? ?Updated Vital Signs ?BP 134/74   Pulse 73   Temp 98.2 ?F (36.8 ?C)   Resp 20   SpO2 100%  ? ?Visual Acuity ?Right Eye Distance:   ?Left Eye Distance:   ?Bilateral Distance:   ? ?Right Eye Near:   ?Left Eye Near:    ?Bilateral Near:    ? ? ?Physical Exam ?Vitals signs and nursing note reviewed.  ?Constitutional:   ?   General: She is not in acute distress. ?   Appearance: Normal appearance. She is not ill-appearing, toxic-appearing or diaphoretic.  ?HENT:  ?   Head: Normocephalic.  ?   Right Ear: Tympanic membrane dull and gray, but ear canal and external ear normal.  ?   Left Ear: Tympanic membrane, ear canal and external ear normal.  ?   Nose: Nose normal.  ?   Mouth/Throat:  ?   Mouth: Mucous membranes are moist.  ?Eyes:  ?   General: No scleral icterus.    ?   Right eye: No discharge.     ?   Left eye: No discharge.  ?   Conjunctiva/sclera: Conjunctivae normal.  ?Neck:  ?   Musculoskeletal: Neck supple. No neck rigidity.  ?Cardiovascular:  ?   Rate and Rhythm: Normal rate and regular rhythm.  ?   Heart sounds: No murmur.  ?  Pulmonary:  ?   Effort: Pulmonary effort is normal.  ?   Breath sounds: Normal breath sounds.  ?Musculoskeletal: Normal range of motion.  ?Lymphadenopathy:  ?    Cervical: No cervical adenopathy.  ?Skin: ?   General: Skin is warm and dry.  ?   Coloration: Skin is not jaundiced.  ?   Findings: No rash.  ?Neurological:  ?   Mental Status: She is alert and oriented to person, place, and time.  ?   Gait: Gait normal.  ?Psychiatric:     ?   Mood and Affect: Mood normal.     ?   Behavior: Behavior normal.     ?   Thought Content: Thought content normal.     ?   Judgment: Judgment normal.  ? ? ?UC Treatments / Results  ?Labs ?(all labs ordered are listed, but only abnormal results are displayed) ?Labs Reviewed  ?SARS CORONAVIRUS 2 (TAT 6-24 HRS)  ?POCT RAPID STREP A, ED / UC  ?POC INFLUENZA A AND B ANTIGEN (URGENT CARE ONLY)  ? ?Flu A&B and strep are negative  ?EKG ? ? ?Radiology ?No results found. ? ?Procedures ?Procedures (including critical care time) ? ?Medications Ordered in UC ?Medications - No data to display ? ?Initial Impression / Assessment and Plan / UC Course  ?I have reviewed the triage vital signs and the nursing notes. ? ?Pertinent labs results that were available during my care of the patient were reviewed by me and considered in my medical decision making (see chart for details).  ? ?Flu like illness ?I placed her on Tessalon and Flonase ?Covid test is pending. See instructions.  ? ? ? ?Final Clinical Impressions(s) / UC Diagnoses  ? ?Final diagnoses:  ?Influenza-like illness  ? ? ? ?Discharge Instructions   ? ?  ?Your strep and flu test are negative. You do not have signs of a bacteria infection. ?Viral respiratory illness can last 2-3 week. If you get worse with a fever of 100.5 or more, come back.  ?Stay quarantined until you get your covid results.  ?Do saline nose rinses to clean out the nose congestion.  ? ? ? ? ? ?ED Prescriptions   ? ? Medication Sig Dispense Auth. Provider  ? benzonatate (TESSALON) 100 MG capsule Take 2 capsules (200 mg total) by mouth 3 (three) times daily. 40 capsule Rodriguez-Southworth, Jordanny Waddington, PA-C  ? fluticasone (FLONASE) 50 MCG/ACT  nasal spray Place 2 sprays into both nostrils daily. For drainage 15 mL Rodriguez-Southworth, Nettie Elm, PA-C  ? ?  ? ?PDMP not reviewed this encounter. ?  ?Garey Ham, PA-C ?06/11/21 2200 ? ?

## 2021-06-12 ENCOUNTER — Other Ambulatory Visit: Payer: Self-pay

## 2021-06-12 LAB — SARS CORONAVIRUS 2 (TAT 6-24 HRS): SARS Coronavirus 2: NEGATIVE

## 2021-06-25 ENCOUNTER — Other Ambulatory Visit: Payer: Self-pay

## 2021-06-26 ENCOUNTER — Other Ambulatory Visit: Payer: Self-pay

## 2021-06-28 ENCOUNTER — Ambulatory Visit: Payer: Medicaid Other

## 2021-07-03 ENCOUNTER — Other Ambulatory Visit: Payer: Self-pay

## 2021-07-27 ENCOUNTER — Other Ambulatory Visit: Payer: Self-pay | Admitting: Family Medicine

## 2021-07-27 ENCOUNTER — Other Ambulatory Visit: Payer: Self-pay

## 2021-07-27 DIAGNOSIS — I10 Essential (primary) hypertension: Secondary | ICD-10-CM

## 2021-07-27 MED ORDER — LOSARTAN POTASSIUM 50 MG PO TABS
ORAL_TABLET | Freq: Every day | ORAL | 0 refills | Status: DC
  Filled 2021-07-27: qty 30, 30d supply, fill #0

## 2021-07-27 MED ORDER — HYDROCHLOROTHIAZIDE 25 MG PO TABS
ORAL_TABLET | Freq: Every day | ORAL | 0 refills | Status: DC
  Filled 2021-07-27: qty 30, 30d supply, fill #0

## 2021-07-30 ENCOUNTER — Ambulatory Visit: Payer: Medicaid Other | Admitting: Family Medicine

## 2021-07-31 ENCOUNTER — Ambulatory Visit: Payer: Medicaid Other

## 2021-08-13 ENCOUNTER — Ambulatory Visit: Payer: Self-pay

## 2021-08-13 ENCOUNTER — Encounter (HOSPITAL_COMMUNITY): Payer: Self-pay | Admitting: Emergency Medicine

## 2021-08-13 ENCOUNTER — Ambulatory Visit (HOSPITAL_COMMUNITY)
Admission: EM | Admit: 2021-08-13 | Discharge: 2021-08-13 | Disposition: A | Discharge status: Home / Self Care | Payer: Medicaid Other | Attending: Emergency Medicine | Admitting: Emergency Medicine

## 2021-08-13 ENCOUNTER — Other Ambulatory Visit: Payer: Self-pay

## 2021-08-13 DIAGNOSIS — Z20818 Contact with and (suspected) exposure to other bacterial communicable diseases: Secondary | ICD-10-CM

## 2021-08-13 DIAGNOSIS — B9689 Other specified bacterial agents as the cause of diseases classified elsewhere: Secondary | ICD-10-CM

## 2021-08-13 DIAGNOSIS — J302 Other seasonal allergic rhinitis: Secondary | ICD-10-CM | POA: Diagnosis not present

## 2021-08-13 DIAGNOSIS — J4520 Mild intermittent asthma, uncomplicated: Secondary | ICD-10-CM | POA: Diagnosis not present

## 2021-08-13 DIAGNOSIS — J038 Acute tonsillitis due to other specified organisms: Secondary | ICD-10-CM | POA: Diagnosis not present

## 2021-08-13 DIAGNOSIS — J029 Acute pharyngitis, unspecified: Secondary | ICD-10-CM | POA: Diagnosis not present

## 2021-08-13 LAB — POCT RAPID STREP A, ED / UC: Streptococcus, Group A Screen (Direct): NEGATIVE

## 2021-08-13 MED ORDER — ALBUTEROL SULFATE HFA 108 (90 BASE) MCG/ACT IN AERS
2.0000 | INHALATION_SPRAY | Freq: Four times a day (QID) | RESPIRATORY_TRACT | 2 refills | Status: DC | PRN
  Filled 2021-08-13: qty 18, 25d supply, fill #0
  Filled 2021-09-26: qty 18, 25d supply, fill #1

## 2021-08-13 MED ORDER — FLUTICASONE PROPIONATE 50 MCG/ACT NA SUSP
1.0000 | Freq: Every day | NASAL | 1 refills | Status: DC
  Filled 2021-08-13: qty 16, 30d supply, fill #0
  Filled 2021-09-26: qty 16, 60d supply, fill #1

## 2021-08-13 MED ORDER — CETIRIZINE HCL 10 MG PO TABS
10.0000 mg | ORAL_TABLET | Freq: Every day | ORAL | 1 refills | Status: DC
  Filled 2021-08-13: qty 30, 30d supply, fill #0
  Filled 2021-09-26: qty 30, 30d supply, fill #1

## 2021-08-13 MED ORDER — CEFDINIR 300 MG PO CAPS
300.0000 mg | ORAL_CAPSULE | Freq: Two times a day (BID) | ORAL | 0 refills | Status: AC
  Filled 2021-08-13: qty 20, 10d supply, fill #0

## 2021-08-13 NOTE — ED Provider Notes (Signed)
?[facility] ? ? ? ?CSN: [id number] ?Arrival date & time: 08/13/21  1157 ?  ? ?HISTORY  ? ?Chief Complaint  ?Patient presents with  ?? Generalized Body Aches  ?? Otalgia  ?? Eye Drainage  ?? Headache  ? ?HPI ?Crystal Burns is a 41 y.o. female. Pt presents today complaining of HA, sore throat, pressure in both ears, body ache, congestion, eye irritation, and body aches. Pt states that she also noticed her BP has been elevated.  Pt sx started yesterday.  Patient states her daughter was diagnosed with strep 2 days ago, states her daughter has been sleeping with her for the past week.  Patient reports a history of allergies, not currently taking any allergy medications. ? ?The history is provided by the patient.  ?Past Medical History:  ?Diagnosis Date  ?? Asthma   ? 2010-only bothers pt during bad cold  ?? Depression   ? 2010-had when mother passed away  ?? History of gonorrhea   ?? Hypertension   ? 2004  ?? Obesity   ?? Pre-diabetes   ?? Preeclampsia complicating hypertension 01/21/2015  ? ?Patient Active Problem List  ? Diagnosis Date Noted  ?? Bilateral primary osteoarthritis of knee 02/28/2020  ?? Precordial chest pain 05/03/2019  ?? Educated about COVID-19 virus infection 05/03/2019  ?? Hoarseness of voice 03/17/2018  ?? Gastroesophageal reflux disease without esophagitis 03/17/2018  ?? Supervision of normal pregnancy, antepartum 12/15/2017  ?? Acute medial meniscus tear of right knee 02/10/2017  ?? Vitamin D deficiency 07/10/2015  ?? Anxiety 06/20/2015  ?? Costochondritis 05/18/2015  ?? Primary osteoarthritis of right knee 04/22/2014  ?? Essential hypertension 07/18/2012  ?? LUMBOSACRAL STRAIN, ACUTE 07/05/2008  ?? Morbid obesity ([place]) 02/24/2008  ?? Asthma with exacerbation 02/24/2008  ? ?Past Surgical History:  ?Procedure Laterality Date  ?? CHOLECYSTECTOMY    ?? DILATION AND CURETTAGE OF UTERUS    ?? WISDOM TOOTH EXTRACTION    ? ?OB History   ? ? Gravida  ?11  ? Para  ?7  ? Term  ?7  ? Preterm   ?0  ? AB  ?4  ? Living  ?6  ?  ? ? SAB  ?4  ? IAB  ?0  ? Ectopic  ?0  ? Multiple  ?0  ? Live Births  ?7  ?   ?  ?  ? ?Home Medications   ? ?Prior to Admission medications   ?Medication Sig Start Date End Date Taking? Authorizing Provider  ?albuterol (VENTOLIN HFA) 108 (90 Base) MCG/ACT inhaler Inhale 2 puffs into the lungs every 6 (six) hours as needed for wheezing or shortness of breath (Cough). 08/13/21  Yes Lynden Oxford Scales, PA-C  ?cefdinir (OMNICEF) 300 MG capsule Take 1 capsule (300 mg total) by mouth 2 (two) times daily for 10 days. 08/13/21 08/23/21 Yes Lynden Oxford Scales, PA-C  ?cetirizine (ZYRTEC ALLERGY) 10 MG tablet Take 1 tablet (10 mg total) by mouth at bedtime. 08/13/21 02/09/22 Yes Lynden Oxford Scales, PA-C  ?fluticasone (FLONASE) 50 MCG/ACT nasal spray Place 1 spray into both nostrils daily. 08/13/21  Yes Lynden Oxford Scales, PA-C  ?diclofenac Sodium (VOLTAREN) 1 % GEL Apply 4 g topically 4 (four) times daily. 09/25/20   LampteyMyrene Galas, MD  ?hydrochlorothiazide (HYDRODIURIL) 25 MG tablet TAKE 1 TABLET (25 MG TOTAL) BY MOUTH DAILY. 07/27/21 07/27/22  Charlott Rakes, MD  ?losartan (COZAAR) 50 MG tablet TAKE 1 TABLET (50 MG TOTAL) BY MOUTH DAILY. 07/27/21 07/27/22  Charlott Rakes, MD  ?metFORMIN (  GLUCOPHAGE) 500 MG tablet TAKE 2 TABLETS (1,000 MG TOTAL) BY MOUTH 2 (TWO) TIMES DAILY WITH A MEAL. 07/24/20 07/24/21  Charlott Rakes, MD  ?metFORMIN (GLUCOPHAGE) 500 MG tablet TAKE 2 TABLETS (1,000 MG TOTAL) BY MOUTH 2 (TWO) TIMES DAILY WITH A MEAL. 07/27/20 07/27/21  Charlott Rakes, MD  ? ?Family History ?Family History  ?Problem Relation Age of Onset  ?? Breast cancer Mother   ?? Hypertension Mother   ?? Diabetes Mother   ?? Cancer Mother   ?     Breast   ?? Heart attack Mother 34  ?? Hypertension Father   ?? Diabetes Father   ?? Hyperlipidemia Father   ?? Arthritis Other   ?? Asthma Other   ?? Breast cancer Other   ?? Heart failure Other   ?? Congenital heart disease Other   ?? Depression Other   ??  Heart attack Other   ? ?Social History ?Social History  ? ?Tobacco Use  ?? Smoking status: Former  ?  Types: Cigarettes  ?  Quit date: 10/31/2010  ?  Years since quitting: 10.7  ?? Smokeless tobacco: Never  ?Vaping Use  ?? Vaping Use: Never used  ?Substance Use Topics  ?? Alcohol use: No  ?  Alcohol/week: 0.0 standard drinks  ?? Drug use: No  ? ?Allergies   ?Bee venom, Lisinopril, and Shellfish allergy ? ?Review of Systems ?Review of Systems ?Pertinent findings noted in history of present illness.  ? ?Physical Exam ?Triage Vital Signs ?ED Triage Vitals  ?Enc Vitals Group  ?   BP 02/02/21 0827 (!) 147/82  ?   Pulse Rate 02/02/21 0827 72  ?   Resp 02/02/21 0827 18  ?   Temp 02/02/21 0827 98.3 ?F (36.8 ?C)  ?   Temp Source 02/02/21 0827 Oral  ?   SpO2 02/02/21 0827 98 %  ?   Weight --   ?   Height --   ?   Head Circumference --   ?   Peak Flow --   ?   Pain Score 02/02/21 0826 5  ?   Pain Loc --   ?   Pain Edu? --   ?   Excl. in [place]? --   ?No data found. ? ?Updated Vital Signs ?BP 132/89   Pulse 75   Temp 98.2 ?F (36.8 ?C) (Oral)   Resp 18   SpO2 98%  ? ?Physical Exam ?Vitals and nursing note reviewed.  ?Constitutional:   ?   General: She is awake. She is not in acute distress. ?   Appearance: Normal appearance. She is well-developed and well-groomed. She is morbidly obese. She is not ill-appearing, toxic-appearing or diaphoretic.  ?HENT:  ?   Head: Normocephalic and atraumatic.  ?   Salivary Glands: Right salivary gland is diffusely enlarged and tender. Left salivary gland is diffusely enlarged and tender.  ?   Right Ear: Ear canal and external ear normal. No drainage. A middle ear effusion is present. There is no impacted cerumen. Tympanic membrane is bulging. Tympanic membrane is not injected or erythematous.  ?   Left Ear: Ear canal and external ear normal. No drainage. A middle ear effusion is present. There is no impacted cerumen. Tympanic membrane is bulging. Tympanic membrane is not injected or erythematous.   ?   Ears:  ?   Comments: Bilateral EACs normal, both TMs bulging with clear fluid ?   Nose: Rhinorrhea present. No nasal deformity, septal deviation, signs of injury, nasal tenderness,  mucosal edema or congestion. Rhinorrhea is clear.  ?   Right Nostril: Occlusion present. No foreign body, epistaxis or septal hematoma.  ?   Left Nostril: Occlusion present. No foreign body, epistaxis or septal hematoma.  ?   Right Turbinates: Enlarged, swollen and pale.  ?   Left Turbinates: Enlarged, swollen and pale.  ?   Right Sinus: No maxillary sinus tenderness or frontal sinus tenderness.  ?   Left Sinus: No maxillary sinus tenderness or frontal sinus tenderness.  ?   Mouth/Throat:  ?   Lips: Pink. No lesions.  ?   Mouth: Mucous membranes are moist. No oral lesions.  ?   Pharynx: Uvula midline. Pharyngeal swelling, posterior oropharyngeal erythema and uvula swelling present. No oropharyngeal exudate.  ?   Tonsils: Tonsillar exudate present. 3+ on the right. 3+ on the left.  ?   Comments: Postnasal drip ?Eyes:  ?   General: Lids are normal.     ?   Right eye: No discharge.     ?   Left eye: No discharge.  ?   Extraocular Movements: Extraocular movements intact.  ?   Conjunctiva/sclera: Conjunctivae normal.  ?   Right eye: Right conjunctiva is not injected.  ?   Left eye: Left conjunctiva is not injected.  ?Neck:  ?   Trachea: Trachea and phonation normal.  ?Cardiovascular:  ?   Rate and Rhythm: Normal rate and regular rhythm.  ?   Pulses: Normal pulses.  ?   Heart sounds: Normal heart sounds. No murmur heard. ?  No friction rub. No gallop.  ?Pulmonary:  ?   Effort: Pulmonary effort is normal. No accessory muscle usage, prolonged expiration or respiratory distress.  ?   Breath sounds: Normal breath sounds. No stridor, decreased air movement or transmitted upper airway sounds. No decreased breath sounds, wheezing, rhonchi or rales.  ?Chest:  ?   Chest wall: No tenderness.  ?Musculoskeletal:     ?   General: Normal range of  motion.  ?   Cervical back: Normal range of motion and neck supple. Normal range of motion.  ?Lymphadenopathy:  ?   Cervical: No cervical adenopathy.  ?Skin: ?   General: Skin is warm and dry.  ?   Findings: No

## 2021-08-13 NOTE — ED Triage Notes (Signed)
Pt is present today with HA, congestion, eye irritation, and body aches. Pt states that she also noticed her BP has been elevated Pt sx started yesterday  ?

## 2021-08-13 NOTE — Telephone Encounter (Signed)
Patient was called to get telephone appointment for sore throat. Patient states that she was currently in the Urgent care. ?

## 2021-08-13 NOTE — Telephone Encounter (Signed)
Reason for Disposition ? [1] Sore throat AND [2] strep throat EXPOSURE (i.e., meets definition) within past 10 days ? ?Answer Assessment - Initial Assessment Questions ?1. REASON FOR CALL or QUESTION: "What is your reason for calling today?" or "How can I best help you?" or "What question do you have that I can help answer?" ?    Pt has had a HA for 2 days, right side finger tingling, and a history of HTN. Yesterday bp was 148/ over something in the 90's.  She has not taken her BP today. Pt has Ear and throat pain and known exposure to Strep from 46 year old daughter.  ? ?Pt states she just doesn't feel well. Pt is on her way home to take her BP. She will then go to UC to be seen for strep and other s/s. ? ?Answer Assessment - Initial Assessment Questions ?1. STREP EXPOSURE: "Was the exposure to someone who lives within your home?" If not, ask: "How much contact did you have with the sick individual?"  ?    Yes - daughter ?2. ONSET: "How many days ago did the contact occur?"  ?    ongoing ?3. PROVEN STREP: "Are you sure the person with strep had a positive throat culture or rapid strep test?"  ?    yes ?4. STREP SYMPTOMS: "Do YOU have a sore throat, fever, or other symptoms suggestive of strep?"  ?    Sore throat - ear pain ?5. VIRAL SYMPTOMS: "Are there any symptoms of a cold, such as a runny nose, cough, hoarse voice?" ?    Hoarse - feels poorly ?6. PREGNANCY: "Is there any chance you are pregnant?" "When was your last menstrual period?" ?    no ? ?Protocols used: Information Only Call - No Triage-A-AH, Strep Throat Exposure-A-AH ? ?

## 2021-08-13 NOTE — Discharge Instructions (Signed)
Your strep test today is negative.  Streptococcal throat culture will be performed per our protocol.  The result of your throat culture will be posted to your MyChart once it is complete, this typically takes 3 to 5 days.   ?  ?Based on my physical exam findings and the history you provided to me today, I recommend that you begin antibiotics now for presumed strep throat instead of waiting for the strep culture result.  I have sent a prescription to your pharmacy.  After 24 hours of antibiotics, you should begin to feel significantly better.   ?  ?After 24 hours of taking antibiotics, please discard your toothbrush as well as any other oral devices that you are currently using and replace them with new ones to avoid reinfection. ?  ?If your streptococcal throat culture has a negative result but you feel significantly better after taking antibiotics for 24 to 48 hours, I strongly recommend that you finish the full 10-day course.  Bacterial culture tests are only as reliable as the laboratory technician performing them.  Alternately, if your streptococcal throat culture has a negative result and you see no improvement of your symptoms after 24 to 48 hours of antibiotics, please discontinue the antibiotics as they are no longer indicated.  Your throat infection will then be most likely considered viral and will have to resolve on its own. ?  ?Once you have been on antibiotics for a full 24 hours, you are no longer considered contagious.   I have provided you with a note to return to work. ?  ?Even if you are feeling better, please make sure that you finish the full 10-day course and do not skip any doses.  Failure to complete a full course of antibiotics for strep throat can result in worsening infection that may require longer treatment with stronger antibiotics. ? ?Cefdinir (Omnicef):  1 capsule twice daily for 10 days, you can take it with or without food.  This antibiotic can cause upset stomach, this will resolve  once antibiotics are complete.  You are welcome to use a probiotic, eat yogurt, take Imodium while taking this medication.  Please avoid other systemic medications such as Maalox, Pepto-Bismol or milk of magnesia as they can interfere with your body's ability to absorb the antibiotics. ?  ?It is very important that you take antibiotics as prescribed.  If you skip doses or do not complete the full course of antibiotics, you put yourself at significant risk of recurrent infection which can often be worse than your initial infection. ?  ? ? ?Your symptoms and my physical exam findings are concerning for exacerbation of your underlying allergies.  It is important that you begin your allergy regimen now and are consistent with taking allergy medications exactly as prescribed.  Allergy medications are preventative and therefore only work well when they are taken daily, not "as needed". ?  ?Not taking all of your allergy medications as they have been prescribed for you and in the combination in which they have been prescribed can increase your risk of getting more frequent upper respiratory infections, lower respiratory disorders, skin reactions, and eye irritations that may or may not require the use of antibiotics and steroids and can result in loss of time at work, celebrations with family and friends as well as missed social opportunities. ?  ?Please see the list below for recommended medications, dosages and frequencies to provide relief of current symptoms:   ?  ?Zyrtec (cetirizine): This is  an excellent second-generation antihistamine that helps to reduce respiratory inflammatory response to environmental allergens.  In some patients, this medication can cause daytime sleepiness so I recommend that you take 1 tablet daily at bedtime.   ?  ?Flonase (fluticasone): This is a steroid nasal spray that you use once daily, 1 spray in each nare.  This medication does not work well if you decide to use it only used as you  feel you need to, it works best used on a daily basis.  After 3 to 5 days of use, you will notice significant reduction of the inflammation and mucus production that is currently being caused by exposure to allergens, whether seasonal or environmental.  The most common side effect of this medication is nosebleeds.  If you experience a nosebleed, please discontinue use for 1 week, then feel free to resume.  I have provided you with a prescription.  I have also provided you with a coupon just in case your insurance will not cover it. ?  ?ProAir, Ventolin, Proventil (albuterol): This inhaled medication contains a short acting beta agonist bronchodilator.  This medication works on the smooth muscle that opens and constricts of your airways by relaxing the muscle.  The result of relaxation of the smooth muscle is increased air movement and improved work of breathing.  This is a short acting medication that can be used every 4-6 hours as needed for increased work of breathing, shortness of breath, wheezing and excessive coughing.  I have provided you with a prescription.  ?  ?If you find that you have not had significant relief of your symptoms in the next 7 to 10 days, please follow-up with your primary care provider or return here to urgent care for repeat evaluation and further recommendations. ?  ?Thank you for visiting urgent care today.  We appreciate the opportunity to participate in your care. ? ?

## 2021-08-16 LAB — CULTURE, GROUP A STREP (THRC)

## 2021-08-23 ENCOUNTER — Ambulatory Visit: Payer: Medicaid Other | Admitting: Family Medicine

## 2021-08-24 ENCOUNTER — Other Ambulatory Visit: Payer: Self-pay | Admitting: Family Medicine

## 2021-08-24 ENCOUNTER — Other Ambulatory Visit: Payer: Self-pay

## 2021-08-24 ENCOUNTER — Ambulatory Visit: Payer: Medicaid Other

## 2021-08-24 DIAGNOSIS — I10 Essential (primary) hypertension: Secondary | ICD-10-CM

## 2021-08-24 MED ORDER — LOSARTAN POTASSIUM 50 MG PO TABS
ORAL_TABLET | Freq: Every day | ORAL | 0 refills | Status: DC
  Filled 2021-08-24: qty 30, 30d supply, fill #0

## 2021-08-24 MED ORDER — METFORMIN HCL 500 MG PO TABS
ORAL_TABLET | Freq: Two times a day (BID) | ORAL | 6 refills | Status: DC
  Filled 2021-08-24: qty 120, 30d supply, fill #0
  Filled 2021-09-26 – 2021-10-24 (×2): qty 120, 30d supply, fill #1
  Filled 2022-01-04: qty 120, 30d supply, fill #2

## 2021-08-24 MED ORDER — HYDROCHLOROTHIAZIDE 25 MG PO TABS
ORAL_TABLET | Freq: Every day | ORAL | 0 refills | Status: DC
  Filled 2021-08-24: qty 30, 30d supply, fill #0

## 2021-08-27 ENCOUNTER — Other Ambulatory Visit: Payer: Self-pay

## 2021-08-31 ENCOUNTER — Ambulatory Visit (INDEPENDENT_AMBULATORY_CARE_PROVIDER_SITE_OTHER): Payer: Medicaid Other

## 2021-08-31 ENCOUNTER — Ambulatory Visit (HOSPITAL_COMMUNITY)
Admission: EM | Admit: 2021-08-31 | Discharge: 2021-08-31 | Disposition: A | Discharge status: Home / Self Care | Payer: Medicaid Other | Attending: Family Medicine | Admitting: Family Medicine

## 2021-08-31 ENCOUNTER — Encounter (HOSPITAL_COMMUNITY): Payer: Self-pay | Admitting: Emergency Medicine

## 2021-08-31 DIAGNOSIS — R059 Cough, unspecified: Secondary | ICD-10-CM

## 2021-08-31 DIAGNOSIS — U071 COVID-19: Secondary | ICD-10-CM | POA: Diagnosis not present

## 2021-08-31 DIAGNOSIS — J4521 Mild intermittent asthma with (acute) exacerbation: Secondary | ICD-10-CM

## 2021-08-31 DIAGNOSIS — R079 Chest pain, unspecified: Secondary | ICD-10-CM | POA: Diagnosis not present

## 2021-08-31 MED ORDER — NIRMATRELVIR/RITONAVIR (PAXLOVID)TABLET: ORAL_TABLET | ORAL | 0 refills | Status: DC

## 2021-08-31 MED ORDER — TRIAMCINOLONE ACETONIDE 40 MG/ML IJ SUSP: 40.0000 mg | Freq: Once | INTRAMUSCULAR | Status: DC

## 2021-08-31 MED ORDER — TRIAMCINOLONE ACETONIDE 40 MG/ML IJ SUSP
INTRAMUSCULAR | Status: AC
  Filled 2021-08-31: qty 1

## 2021-08-31 NOTE — ED Triage Notes (Signed)
Pt reports that daughter was positive for covid so patient took test today due to having cough and congestion  today cough is dry that was productive for 2 days. C/o chest is sore. Pt other daughter has the flu.

## 2021-08-31 NOTE — ED Provider Notes (Signed)
[facility]    CSN: [id number] Arrival date & time: 08/31/21  1839      History   Chief Complaint Chief Complaint  Patient presents with   Covid Positive   Cough    HPI Crystal Burns is a 41 y.o. female.    Cough Here for cough and congestion that began on May 22.  Has not had fever that she knows of, but she states that she has been taking medicine that could have lowered her temperature for all this time.  No nausea or vomiting or diarrhea.  Today she is feeling heavy and some pain in her right chest.  She has used her inhaler for her asthma more often in the last few days.  Past medical history significant for diabetes and hypertension and the above-mentioned asthma  Today she had a child test positive for the flu; today she had a child test positive for the flu and a different child test positive for COVID  Past Medical History:  Diagnosis Date   Asthma    2010-only bothers pt during bad cold   Depression    2010-had when mother passed away   History of gonorrhea    Hypertension    2004   Obesity    Pre-diabetes    Preeclampsia complicating hypertension 01/21/2015    Patient Active Problem List   Diagnosis Date Noted   Bilateral primary osteoarthritis of knee 02/28/2020   Precordial chest pain 05/03/2019   Educated about COVID-19 virus infection 05/03/2019   Hoarseness of voice 03/17/2018   Gastroesophageal reflux disease without esophagitis 03/17/2018   Supervision of normal pregnancy, antepartum 12/15/2017   Acute medial meniscus tear of right knee 02/10/2017   Vitamin D deficiency 07/10/2015   Anxiety 06/20/2015   Costochondritis 05/18/2015   Primary osteoarthritis of right knee 04/22/2014   Essential hypertension 07/18/2012   LUMBOSACRAL STRAIN, ACUTE 07/05/2008   Morbid obesity ([place]) 02/24/2008   Asthma with exacerbation 02/24/2008    Past Surgical History:  Procedure Laterality Date   CHOLECYSTECTOMY     DILATION AND CURETTAGE  OF UTERUS     WISDOM TOOTH EXTRACTION      OB History     Gravida  11   Para  7   Term  7   Preterm  0   AB  4   Living  6      SAB  4   IAB  0   Ectopic  0   Multiple  0   Live Births  7            Home Medications    Prior to Admission medications   Medication Sig Start Date End Date Taking? Authorizing Provider  nirmatrelvir/ritonavir EUA (PAXLOVID) 20 x 150 MG & 10 x $Re'100MG'NRx$  TABS Take nirmatrelvir (150 mg) two tablets twice daily for 5 days and ritonavir (100 mg) one tablet twice daily for 5 days. Renal function normal. 08/31/21  Yes Adlynn Lowenstein, Gwenlyn Perking, MD  albuterol (VENTOLIN HFA) 108 (90 Base) MCG/ACT inhaler Inhale 2 puffs into the lungs every 6 (six) hours as needed for wheezing or shortness of breath (Cough). 08/13/21   Lynden Oxford Scales, PA-C  cetirizine (ZYRTEC ALLERGY) 10 MG tablet Take 1 tablet (10 mg total) by mouth at bedtime. 08/13/21 02/09/22  Lynden Oxford Scales, PA-C  diclofenac Sodium (VOLTAREN) 1 % GEL Apply 4 g topically 4 (four) times daily. 09/25/20   Chase Picket, MD  fluticasone (FLONASE) 50 MCG/ACT nasal spray  Place 1 spray into both nostrils daily. 08/13/21   Lynden Oxford Scales, PA-C  hydrochlorothiazide (HYDRODIURIL) 25 MG tablet TAKE 1 TABLET (25 MG TOTAL) BY MOUTH DAILY. 08/24/21 08/24/22  Charlott Rakes, MD  losartan (COZAAR) 50 MG tablet TAKE 1 TABLET (50 MG TOTAL) BY MOUTH DAILY. 08/24/21 08/24/22  Charlott Rakes, MD  metFORMIN (GLUCOPHAGE) 500 MG tablet TAKE 2 TABLETS (1,000 MG TOTAL) BY MOUTH 2 (TWO) TIMES DAILY WITH A MEAL. 08/24/21 08/24/22  Charlott Rakes, MD    Family History Family History  Problem Relation Age of Onset   Breast cancer Mother    Hypertension Mother    Diabetes Mother    Cancer Mother        Breast    Heart attack Mother 91   Hypertension Father    Diabetes Father    Hyperlipidemia Father    Arthritis Other    Asthma Other    Breast cancer Other    Heart failure Other    Congenital heart  disease Other    Depression Other    Heart attack Other     Social History Social History   Tobacco Use   Smoking status: Former    Types: Cigarettes    Quit date: 10/31/2010    Years since quitting: 10.8   Smokeless tobacco: Never  Vaping Use   Vaping Use: Never used  Substance Use Topics   Alcohol use: No    Alcohol/week: 0.0 standard drinks   Drug use: No     Allergies   Bee venom, Lisinopril, and Shellfish allergy   Review of Systems Review of Systems  Respiratory:  Positive for cough.     Physical Exam Triage Vital Signs ED Triage Vitals  Enc Vitals Group     BP 08/31/21 1934 (!) 150/95     Pulse Rate 08/31/21 1934 71     Resp 08/31/21 1934 19     Temp 08/31/21 1934 98.1 F (36.7 C)     Temp Source 08/31/21 1934 Oral     SpO2 08/31/21 1934 100 %     Weight --      Height --      Head Circumference --      Peak Flow --      Pain Score 08/31/21 1933 8     Pain Loc --      Pain Edu? --      Excl. in [place]? --    No data found.  Updated Vital Signs BP (!) 150/95 (BP Location: Left Arm)   Pulse 71   Temp 98.1 F (36.7 C) (Oral)   Resp 19   SpO2 100%   Visual Acuity Right Eye Distance:   Left Eye Distance:   Bilateral Distance:    Right Eye Near:   Left Eye Near:    Bilateral Near:     Physical Exam Vitals reviewed.  Constitutional:      General: She is not in acute distress.    Appearance: She is not toxic-appearing.  HENT:     Right Ear: Tympanic membrane and ear canal normal.     Left Ear: Tympanic membrane and ear canal normal.     Nose: Nose normal.     Mouth/Throat:     Mouth: Mucous membranes are moist.     Pharynx: No oropharyngeal exudate or posterior oropharyngeal erythema.  Eyes:     Extraocular Movements: Extraocular movements intact.     Conjunctiva/sclera: Conjunctivae normal.     Pupils: Pupils are  equal, round, and reactive to light.  Cardiovascular:     Rate and Rhythm: Normal rate and regular rhythm.     Heart  sounds: No murmur heard. Pulmonary:     Effort: Pulmonary effort is normal. No respiratory distress.     Breath sounds: No wheezing, rhonchi or rales.  Musculoskeletal:     Cervical back: Neck supple.  Lymphadenopathy:     Cervical: No cervical adenopathy.  Skin:    Capillary Refill: Capillary refill takes less than 2 seconds.     Coloration: Skin is not jaundiced or pale.  Neurological:     General: No focal deficit present.     Mental Status: She is alert and oriented to person, place, and time.  Psychiatric:        Behavior: Behavior normal.     UC Treatments / Results  Labs (all labs ordered are listed, but only abnormal results are displayed) Labs Reviewed - No data to display  EKG   Radiology DG Chest 2 View  Result Date: 08/31/2021 CLINICAL DATA:  Chest pain, cough EXAM: CHEST - 2 VIEW COMPARISON:  11/11/2017 FINDINGS: Lungs are clear.  No pleural effusion or pneumothorax. The heart is normal in size. Visualized osseous structures are within normal limits. IMPRESSION: Normal chest radiographs. Electronically Signed   By: Julian Hy M.D.   On: 08/31/2021 20:19    Procedures Procedures (including critical care time)  Medications Ordered in UC Medications  triamcinolone acetonide (KENALOG-40) injection 40 mg (has no administration in time range)    Initial Impression / Assessment and Plan / UC Course  I have reviewed the triage vital signs and the nursing notes.  Pertinent labs & imaging results that were available during my care of the patient were reviewed by me and considered in my medical decision making (see chart for details).     Chest x-ray is clear.  I will treat for asthma exacerbation with an injection of steroids.  I did warn her that her sugar may elevate with that.  This is day 4 of illness; last EGFR was normal.  I am prescribing Paxlovid for COVID to keep her from becoming more severe.  O2 sat on room air is 100%.   Final Clinical  Impressions(s) / UC Diagnoses   Final diagnoses:  COVID  Mild intermittent asthma with acute exacerbation     Discharge Instructions      Your chest x-ray is clear  Take Paxlovid as directed on the package.  This to keep your COVID from becoming more severe   You have been given a shot of triamcinolone tonight.  This can raise your sugars.  This is for inflammation in your lungs due to your asthma     ED Prescriptions     Medication Sig Dispense Auth. Provider   nirmatrelvir/ritonavir EUA (PAXLOVID) 20 x 150 MG & 10 x $Re'100MG'UPn$  TABS Take nirmatrelvir (150 mg) two tablets twice daily for 5 days and ritonavir (100 mg) one tablet twice daily for 5 days. Renal function normal. 30 tablet Maura Braaten, Gwenlyn Perking, MD      PDMP not reviewed this encounter.   Barrett Henle, MD 08/31/21 [phone]

## 2021-08-31 NOTE — Discharge Instructions (Addendum)
Your chest x-ray is clear  Take Paxlovid as directed on the package.  This to keep your COVID from becoming more severe   You have been given a shot of triamcinolone tonight.  This can raise your sugars.  This is for inflammation in your lungs due to your asthma

## 2021-09-26 ENCOUNTER — Other Ambulatory Visit: Payer: Self-pay

## 2021-09-26 ENCOUNTER — Other Ambulatory Visit: Payer: Self-pay | Admitting: Family Medicine

## 2021-09-26 DIAGNOSIS — I10 Essential (primary) hypertension: Secondary | ICD-10-CM

## 2021-09-27 ENCOUNTER — Other Ambulatory Visit: Payer: Self-pay | Admitting: Family Medicine

## 2021-09-27 ENCOUNTER — Other Ambulatory Visit: Payer: Self-pay

## 2021-09-27 ENCOUNTER — Other Ambulatory Visit: Payer: Self-pay | Admitting: Pharmacist

## 2021-09-27 DIAGNOSIS — I10 Essential (primary) hypertension: Secondary | ICD-10-CM

## 2021-09-27 NOTE — Telephone Encounter (Signed)
Patient presents requesting refills for HCTZ, losartan, and metformin. I approved a 1 month supply last month. Last labs were done >1 year ago. Therefore, patient will need approval from PCP.

## 2021-09-27 NOTE — Telephone Encounter (Signed)
Patient called in very frustrated about being without her blood pressure medication and would like a short supply to hold her over until her 10/30/2021 appointment. Patient is aware PCP is out of the office until Monday 10/01/2021 and would like to know if a covering physician can refill in PCP absence. Patient would like a follow up call as soon as possible.

## 2021-09-27 NOTE — Telephone Encounter (Signed)
Requested medications are due for refill today.  yes  Requested medications are on the active medications list.  yes  Last refill. 08/24/2021 #30 0 refills  Future visit scheduled.   yes  Notes to clinic.  Courtesy refill already given.     Requested Prescriptions  Pending Prescriptions Disp Refills   hydrochlorothiazide (HYDRODIURIL) 25 MG tablet 30 tablet 0    Sig: TAKE 1 TABLET (25 MG TOTAL) BY MOUTH DAILY.     Cardiovascular: Diuretics - Thiazide Failed - 09/27/2021  2:37 PM      Failed - Cr in normal range and within 180 days    Creat  Date Value Ref Range Status  04/22/2014 0.59 0.50 - 1.10 mg/dL Final   Creatinine, Ser  Date Value Ref Range Status  07/27/2020 0.65 0.57 - 1.00 mg/dL Final   Creatinine, Urine  Date Value Ref Range Status  02/06/2015 41.00 mg/dL Final         Failed - K in normal range and within 180 days    Potassium  Date Value Ref Range Status  07/27/2020 3.8 3.5 - 5.2 mmol/L Final         Failed - Na in normal range and within 180 days    Sodium  Date Value Ref Range Status  07/27/2020 140 134 - 144 mmol/L Final         Failed - Last BP in normal range    BP Readings from Last 1 Encounters:  08/31/21 (!) 150/95         Failed - Valid encounter within last 6 months    Recent Outpatient Visits           1 year ago Prediabetes   [facility] [place], Odette Horns, MD   1 year ago Right ear pain   [facility] [place], [place], [place]   2 years ago Upper respiratory tract infection, unspecified type   [facility] [place], [place], [place]   2 years ago No-show for appointment   [facility] [place] Sau[place], MD   2 years ago Dysfunction of right eustachian tube   [facility] Hoy Register, MD       Future Appointments             In 1 month Hoy Register, MD [facility]

## 2021-09-28 ENCOUNTER — Other Ambulatory Visit: Payer: Self-pay | Admitting: *Deleted

## 2021-09-28 ENCOUNTER — Ambulatory Visit (HOSPITAL_COMMUNITY)
Admission: EM | Admit: 2021-09-28 | Discharge: 2021-09-28 | Disposition: A | Discharge status: Home / Self Care | Payer: Medicaid Other | Attending: Family Medicine | Admitting: Family Medicine

## 2021-09-28 ENCOUNTER — Other Ambulatory Visit: Payer: Self-pay | Admitting: Family Medicine

## 2021-09-28 ENCOUNTER — Ambulatory Visit: Payer: Self-pay | Admitting: *Deleted

## 2021-09-28 ENCOUNTER — Other Ambulatory Visit: Payer: Self-pay

## 2021-09-28 ENCOUNTER — Encounter (HOSPITAL_COMMUNITY): Payer: Self-pay | Admitting: Emergency Medicine

## 2021-09-28 DIAGNOSIS — I10 Essential (primary) hypertension: Secondary | ICD-10-CM

## 2021-09-28 DIAGNOSIS — Z760 Encounter for issue of repeat prescription: Secondary | ICD-10-CM

## 2021-09-28 LAB — CBG MONITORING, ED: Glucose-Capillary: 80 mg/dL (ref 70–99)

## 2021-09-28 MED ORDER — HYDROCHLOROTHIAZIDE 25 MG PO TABS: ORAL_TABLET | Freq: Every day | ORAL | 0 refills | Status: DC

## 2021-09-28 MED ORDER — LOSARTAN POTASSIUM 50 MG PO TABS: ORAL_TABLET | Freq: Every day | ORAL | 0 refills | Status: DC

## 2021-09-28 NOTE — ED Provider Notes (Signed)
Patient Contact: 7:20 PM (approximate)   History   Medication Refill, Dizziness, and Headache   HPI  Crystal Burns is a 41 y.o. female presents to the emergency department for refill of her losartan and hydrochlorothiazide.  Patient states that she has been out of her medication for 2 days and has an appointment with her primary care provider next week.  She denies chest pain, chest tightness or abdominal pain.      Physical Exam   Triage Vital Signs: ED Triage Vitals [09/28/21 1908]  Enc Vitals Group     BP 130/84     Pulse Rate 83     Resp 16     Temp 98.2 F (36.8 C)     Temp Source Oral     SpO2 96 %     Weight      Height      Head Circumference      Peak Flow      Pain Score 0     Pain Loc      Pain Edu?      Excl. in [place]?     Most recent vital signs: Vitals:   09/28/21 1908  BP: 130/84  Pulse: 83  Resp: 16  Temp: 98.2 F (36.8 C)  SpO2: 96%     General: Alert and in no acute distress. Eyes:  PERRL. EOMI. Head: No acute traumatic findings ENT:      Nose: No congestion/rhinnorhea.      Mouth/Throat: Mucous membranes are moist. Neck: No stridor. No cervical spine tenderness to palpation. Cardiovascular:  Good peripheral perfusion Respiratory: Normal respiratory effort without tachypnea or retractions. Lungs CTAB. Good air entry to the bases with no decreased or absent breath sounds. Gastrointestinal: Bowel sounds 4 quadrants. Soft and nontender to palpation. No guarding or rigidity. No palpable masses. No distention. No CVA tenderness. Musculoskeletal: Full range of motion to all extremities.  Neurologic:  No gross focal neurologic deficits are appreciated.  Skin:   No rash noted    ED Results / Procedures / Treatments   Labs (all labs ordered are listed, but only abnormal results are displayed) Labs Reviewed  CBG MONITORING, ED         PROCEDURES:  Critical Care performed: No  Procedures   MEDICATIONS ORDERED IN  ED: Medications - No data to display   IMPRESSION / MDM / ASSESSMENT AND PLAN / ED COURSE  I reviewed the triage vital signs and the nursing notes.                              Assessment and plan Medication refill Hypertension 41 year old female presents to the urgent care for medication refill of her losartan and hydrochlorothiazide.  Refills were given and patient was advised to keep her appointment with primary care.      FINAL CLINICAL IMPRESSION(S) / ED DIAGNOSES   Final diagnoses:  Medication refill     Rx / DC Orders   ED Discharge Orders          Ordered    hydrochlorothiazide (HYDRODIURIL) 25 MG tablet  Daily        09/28/21 1912    losartan (COZAAR) 50 MG tablet  Daily        09/28/21 1912             Note:  This document was prepared using Dragon voice recognition software and may include unintentional dictation errors.  Pia Mau [place], [place] 09/28/21 1922

## 2021-09-28 NOTE — Telephone Encounter (Signed)
Requested medication (s) are due for refill today: yes  Requested medication (s) are on the active medication list: yes    Future visit scheduled: moved visit up to 7/7 and on call list if cancellation.  Notes to clinic:  Please assess.      Requested Prescriptions  Pending Prescriptions Disp Refills   losartan (COZAAR) 50 MG tablet 30 tablet 0    Sig: TAKE 1 TABLET (50 MG TOTAL) BY MOUTH DAILY.     Cardiovascular:  Angiotensin Receptor Blockers Failed - 09/28/2021  4:08 PM      Failed - Cr in normal range and within 180 days    Creat  Date Value Ref Range Status  04/22/2014 0.59 0.50 - 1.10 mg/dL Final   Creatinine, Ser  Date Value Ref Range Status  07/27/2020 0.65 0.57 - 1.00 mg/dL Final   Creatinine, Urine  Date Value Ref Range Status  02/06/2015 41.00 mg/dL Final         Failed - K in normal range and within 180 days    Potassium  Date Value Ref Range Status  07/27/2020 3.8 3.5 - 5.2 mmol/L Final         Failed - Last BP in normal range    BP Readings from Last 1 Encounters:  08/31/21 (!) 150/95         Failed - Valid encounter within last 6 months    Recent Outpatient Visits           1 year ago Prediabetes   [facility] [place], Odette Horns, MD   1 year ago Right ear pain   [facility] [place], [place], MD   2 years ago Upper respiratory tract infection, unspecified type   [facility] [place], [place], [place]   2 years ago No-show for appointment   [facility] [place], [place], [place]   2 years ago Dysfunction of right eustachian tube   [facility] Hoy Register, MD       Future Appointments             In 2 weeks Claiborne Rigg, NP [facility]   In 1 month Hoy Register, MD [facility]            Passed - Patient is not pregnant

## 2021-10-02 ENCOUNTER — Other Ambulatory Visit: Payer: Self-pay

## 2021-10-12 ENCOUNTER — Ambulatory Visit: Payer: Medicaid Other | Admitting: Nurse Practitioner

## 2021-10-24 ENCOUNTER — Ambulatory Visit (HOSPITAL_COMMUNITY)
Admission: EM | Admit: 2021-10-24 | Discharge: 2021-10-24 | Disposition: A | Discharge status: Home / Self Care | Payer: Medicaid Other | Attending: Emergency Medicine | Admitting: Emergency Medicine

## 2021-10-24 ENCOUNTER — Encounter (HOSPITAL_COMMUNITY): Payer: Self-pay

## 2021-10-24 ENCOUNTER — Other Ambulatory Visit: Payer: Self-pay | Admitting: Family Medicine

## 2021-10-24 ENCOUNTER — Other Ambulatory Visit: Payer: Self-pay

## 2021-10-24 DIAGNOSIS — H1032 Unspecified acute conjunctivitis, left eye: Secondary | ICD-10-CM | POA: Diagnosis not present

## 2021-10-24 DIAGNOSIS — I10 Essential (primary) hypertension: Secondary | ICD-10-CM

## 2021-10-24 MED ORDER — GENTAMICIN SULFATE 0.3 % OP SOLN: 2.0000 [drp] | OPHTHALMIC | 0 refills | Status: DC

## 2021-10-24 NOTE — ED Provider Notes (Signed)
[facility]    CSN: [id number] Arrival date & time: 10/24/21  1454     History   Chief Complaint Chief Complaint  Patient presents with   Eye Drainage   Eye Pain    HPI Crystal Burns is a 41 y.o. female.  Presents with 2-day history of left eye pain and redness.  Used Clear Eyes drops this morning with no relief.  Reports vision is unchanged, no pain with eye movements.  Clear drainage from the eye.  Reports left nostril is running as well. Denies headache, fever, chills, dizziness. Denies possibility of foreign body.  Past Medical History:  Diagnosis Date   Asthma    2010-only bothers pt during bad cold   Depression    2010-had when mother passed away   History of gonorrhea    Hypertension    2004   Obesity    Pre-diabetes    Preeclampsia complicating hypertension 01/21/2015    Patient Active Problem List   Diagnosis Date Noted   Bilateral primary osteoarthritis of knee 02/28/2020   Precordial chest pain 05/03/2019   Educated about COVID-19 virus infection 05/03/2019   Hoarseness of voice 03/17/2018   Gastroesophageal reflux disease without esophagitis 03/17/2018   Supervision of normal pregnancy, antepartum 12/15/2017   Acute medial meniscus tear of right knee 02/10/2017   Vitamin D deficiency 07/10/2015   Anxiety 06/20/2015   Costochondritis 05/18/2015   Primary osteoarthritis of right knee 04/22/2014   Essential hypertension 07/18/2012   LUMBOSACRAL STRAIN, ACUTE 07/05/2008   Morbid obesity (HCC) 02/24/2008   Asthma with exacerbation 02/24/2008    Past Surgical History:  Procedure Laterality Date   CHOLECYSTECTOMY     DILATION AND CURETTAGE OF UTERUS     WISDOM TOOTH EXTRACTION      OB History     Gravida  11   Para  7   Term  7   Preterm  0   AB  4   Living  6      SAB  4   IAB  0   Ectopic  0   Multiple  0   Live Births  7            Home Medications    Prior to Admission medications   Medication  Sig Start Date End Date Taking? Authorizing Provider  gentamicin (GARAMYCIN) 0.3 % ophthalmic solution Place 2 drops into both eyes every 4 (four) hours. 10/24/21  Yes Gates Jividen, Lurena Joiner, PA-C  albuterol (VENTOLIN HFA) 108 (90 Base) MCG/ACT inhaler Inhale 2 puffs into the lungs every 6 (six) hours as needed for wheezing or shortness of breath (Cough). 08/13/21   Theadora Rama Scales, PA-C  cetirizine (ZYRTEC ALLERGY) 10 MG tablet Take 1 tablet (10 mg total) by mouth at bedtime. 08/13/21 02/09/22  Theadora Rama Scales, PA-C  diclofenac Sodium (VOLTAREN) 1 % GEL Apply 4 g topically 4 (four) times daily. 09/25/20   Merrilee Jansky, MD  fluticasone (FLONASE) 50 MCG/ACT nasal spray Place 1 spray into both nostrils daily. 08/13/21   Theadora Rama Scales, PA-C  hydrochlorothiazide (HYDRODIURIL) 25 MG tablet TAKE 1 TABLET (25 MG TOTAL) BY MOUTH DAILY. 09/28/21 09/28/22  Orvil Feil, PA-C  losartan (COZAAR) 50 MG tablet TAKE 1 TABLET (50 MG TOTAL) BY MOUTH DAILY. 09/28/21 09/28/22  Orvil Feil, PA-C  metFORMIN (GLUCOPHAGE) 500 MG tablet TAKE 2 TABLETS (1,000 MG TOTAL) BY MOUTH 2 (TWO) TIMES DAILY WITH A MEAL. 08/24/21 08/24/22  Hoy Register, MD  nirmatrelvir/ritonavir  EUA (PAXLOVID) 20 x 150 MG & 10 x 100MG  TABS Take nirmatrelvir (150 mg) two tablets twice daily for 5 days and ritonavir (100 mg) one tablet twice daily for 5 days. Renal function normal. 08/31/21   Banister, 09/02/21, MD    Family History Family History  Problem Relation Age of Onset   Breast cancer Mother    Hypertension Mother    Diabetes Mother    Cancer Mother        Breast    Heart attack Mother 53   Hypertension Father    Diabetes Father    Hyperlipidemia Father    Arthritis Other    Asthma Other    Breast cancer Other    Heart failure Other    Congenital heart disease Other    Depression Other    Heart attack Other     Social History Social History   Tobacco Use   Smoking status: Former    Types: Cigarettes    Quit  date: 10/31/2010    Years since quitting: 10.9   Smokeless tobacco: Never  Vaping Use   Vaping Use: Never used  Substance Use Topics   Alcohol use: No    Alcohol/week: 0.0 standard drinks of alcohol   Drug use: No     Allergies   Bee venom, Lisinopril, and Shellfish allergy   Review of Systems Review of Systems  Eyes:  Positive for pain.   Per HPI  Physical Exam Triage Vital Signs ED Triage Vitals  Enc Vitals Group     BP 10/24/21 1520 (!) 142/78     Pulse Rate 10/24/21 1520 69     Resp 10/24/21 1520 18     Temp 10/24/21 1520 97.8 F (36.6 C)     Temp Source 10/24/21 1520 Oral     SpO2 10/24/21 1520 100 %     Weight --      Height --      Head Circumference --      Peak Flow --      Pain Score 10/24/21 1617 0     Pain Loc --      Pain Edu? --      Excl. in GC? --    No data found.  Updated Vital Signs BP (!) 142/78 (BP Location: Left Arm)   Pulse 69   Temp 97.8 F (36.6 C) (Oral)   Resp 18   LMP  (LMP Unknown)   SpO2 100%     Physical Exam Vitals and nursing note reviewed.  Constitutional:      General: She is not in acute distress. HENT:     Mouth/Throat:     Pharynx: Oropharynx is clear. No posterior oropharyngeal erythema.  Eyes:     General: Vision grossly intact. Gaze aligned appropriately.     Extraocular Movements: Extraocular movements intact.     Conjunctiva/sclera:     Right eye: Right conjunctiva is not injected.     Left eye: Left conjunctiva is injected. No chemosis.    Pupils: Pupils are equal, round, and reactive to light.     Comments: Minor swelling of right upper lid. No pain to palpation. EOMI  Cardiovascular:     Rate and Rhythm: Normal rate and regular rhythm.     Pulses: Normal pulses.     Heart sounds: Normal heart sounds.  Pulmonary:     Effort: Pulmonary effort is normal.     Breath sounds: Normal breath sounds.  Musculoskeletal:  Cervical back: Normal range of motion.  Lymphadenopathy:     Cervical: No cervical  adenopathy.  Skin:    General: Skin is warm and dry.  Neurological:     Mental Status: She is alert and oriented to person, place, and time.     UC Treatments / Results  Labs (all labs ordered are listed, but only abnormal results are displayed) Labs Reviewed - No data to display  EKG  Radiology No results found.  Procedures Procedures (including critical care time)  Medications Ordered in UC Medications - No data to display  Initial Impression / Assessment and Plan / UC Course  I have reviewed the triage vital signs and the nursing notes.  Pertinent labs & imaging results that were available during my care of the patient were reviewed by me and considered in my medical decision making (see chart for details).  Likely conjunctivitis. Red and irritated left eye, gentamycin drops. Warm compress to eyelid for swelling. She does have 6 kids and understands this is contagious. Recommend watch for worsening symptoms, any pain with eye movement, swelling of the eye, fevers, protrusion of the eye she needs to be evaluated in the emergency department. Patient agrees to plan and is discharged in stable condition.   Final Clinical Impressions(s) / UC Diagnoses   Final diagnoses:  Acute conjunctivitis of left eye, unspecified acute conjunctivitis type     Discharge Instructions      Use the eye drops every 4 hours for the next 5 days.  I recommend warm compress to the eye as well.  Please go to the emergency department if symptoms worsen.     ED Prescriptions     Medication Sig Dispense Auth. Provider   gentamicin (GARAMYCIN) 0.3 % ophthalmic solution Place 2 drops into both eyes every 4 (four) hours. 15 mL Makaelyn Aponte, Lurena Joiner, PA-C      PDMP not reviewed this encounter.   Rmoni Keplinger, Ray Church 10/24/21 1749

## 2021-10-24 NOTE — Discharge Instructions (Addendum)
Use the eye drops every 4 hours for the next 5 days.  I recommend warm compress to the eye as well.  Please go to the emergency department if symptoms worsen.

## 2021-10-24 NOTE — ED Triage Notes (Signed)
Pt reports left eye pain x 2 days. Pt reports using clear eyes this morning with no relief.

## 2021-10-25 ENCOUNTER — Other Ambulatory Visit: Payer: Self-pay

## 2021-10-30 ENCOUNTER — Other Ambulatory Visit: Payer: Self-pay

## 2021-10-30 ENCOUNTER — Other Ambulatory Visit (HOSPITAL_COMMUNITY)
Admission: RE | Admit: 2021-10-30 | Discharge: 2021-10-30 | Disposition: A | Discharge status: Home / Self Care | Payer: Medicaid Other | Source: Ambulatory Visit | Attending: Family Medicine | Admitting: Family Medicine

## 2021-10-30 ENCOUNTER — Ambulatory Visit: Payer: Medicaid Other | Attending: Family Medicine | Admitting: Family Medicine

## 2021-10-30 ENCOUNTER — Encounter: Payer: Self-pay | Admitting: Family Medicine

## 2021-10-30 VITALS — BP 142/84 | HR 75 | Temp 98.2°F | Ht 63.0 in | Wt 313.2 lb

## 2021-10-30 DIAGNOSIS — I10 Essential (primary) hypertension: Secondary | ICD-10-CM | POA: Diagnosis not present

## 2021-10-30 DIAGNOSIS — Z6841 Body Mass Index (BMI) 40.0 and over, adult: Secondary | ICD-10-CM | POA: Diagnosis not present

## 2021-10-30 DIAGNOSIS — Z124 Encounter for screening for malignant neoplasm of cervix: Secondary | ICD-10-CM

## 2021-10-30 DIAGNOSIS — Z0001 Encounter for general adult medical examination with abnormal findings: Secondary | ICD-10-CM | POA: Diagnosis not present

## 2021-10-30 DIAGNOSIS — R7303 Prediabetes: Secondary | ICD-10-CM | POA: Diagnosis not present

## 2021-10-30 DIAGNOSIS — Z01419 Encounter for gynecological examination (general) (routine) without abnormal findings: Secondary | ICD-10-CM | POA: Diagnosis not present

## 2021-10-30 DIAGNOSIS — Z3042 Encounter for surveillance of injectable contraceptive: Secondary | ICD-10-CM | POA: Diagnosis not present

## 2021-10-30 DIAGNOSIS — Z1231 Encounter for screening mammogram for malignant neoplasm of breast: Secondary | ICD-10-CM

## 2021-10-30 DIAGNOSIS — Z30013 Encounter for initial prescription of injectable contraceptive: Secondary | ICD-10-CM | POA: Diagnosis not present

## 2021-10-30 DIAGNOSIS — H538 Other visual disturbances: Secondary | ICD-10-CM | POA: Diagnosis not present

## 2021-10-30 DIAGNOSIS — Z0000 Encounter for general adult medical examination without abnormal findings: Secondary | ICD-10-CM | POA: Diagnosis not present

## 2021-10-30 DIAGNOSIS — Z3202 Encounter for pregnancy test, result negative: Secondary | ICD-10-CM | POA: Diagnosis not present

## 2021-10-30 DIAGNOSIS — Z1159 Encounter for screening for other viral diseases: Secondary | ICD-10-CM

## 2021-10-30 LAB — POCT URINE PREGNANCY: Preg Test, Ur: NEGATIVE

## 2021-10-30 MED ORDER — LOSARTAN POTASSIUM 50 MG PO TABS
ORAL_TABLET | Freq: Every day | ORAL | 1 refills | Status: DC
  Filled 2021-10-30: qty 90, 90d supply, fill #0
  Filled 2022-01-23 – 2022-01-30 (×2): qty 90, 90d supply, fill #1

## 2021-10-30 MED ORDER — MEDROXYPROGESTERONE ACETATE 150 MG/ML IM SUSP
150.0000 mg | Freq: Once | INTRAMUSCULAR | Status: AC
  Administered 2021-10-30: 150 mg via INTRAMUSCULAR

## 2021-10-30 MED ORDER — HYDROCHLOROTHIAZIDE 25 MG PO TABS
ORAL_TABLET | Freq: Every day | ORAL | 1 refills | Status: DC
  Filled 2021-10-30: qty 90, 90d supply, fill #0
  Filled 2022-01-23 – 2022-02-19 (×2): qty 90, 90d supply, fill #1

## 2021-10-30 NOTE — Progress Notes (Signed)
Vision blurred after using eye drops for recent pink eye Medication refill.

## 2021-10-30 NOTE — Progress Notes (Signed)
Subjective:  Patient ID: Crystal Burns, female    DOB: [birth year]  Age: 41 y.o. MRN: [id number]  CC: Gynecologic Exam   HPI Crystal Burns is a 41 y.o. year old female with a history of hypertension, morbid obesity, prediabetes who presents today for complete physical exam Last Pap smear revealed ASCUS, high risk HPV positive, HPV 18, 45 positive She is due for her mammogram as well.  Interval History: Her blood pressure is slightly elevated and she has been out of her antihypertensives.  Currently adherent with metformin for her prediabetes. She does not exercise much and she works from home and continues to snack.  States she is interested in working on weight loss.  Complains of blurry vision ever since she completed gentamicin drops for acute conjunctivitis. Past Medical History:  Diagnosis Date   Asthma    2010-only bothers pt during bad cold   Depression    2010-had when mother passed away   History of gonorrhea    Hypertension    2004   Obesity    Pre-diabetes    Preeclampsia complicating hypertension 01/21/2015    Past Surgical History:  Procedure Laterality Date   CHOLECYSTECTOMY     DILATION AND CURETTAGE OF UTERUS     WISDOM TOOTH EXTRACTION      Family History  Problem Relation Age of Onset   Breast cancer Mother    Hypertension Mother    Diabetes Mother    Cancer Mother        Breast    Heart attack Mother 64   Hypertension Father    Diabetes Father    Hyperlipidemia Father    Arthritis Other    Asthma Other    Breast cancer Other    Heart failure Other    Congenital heart disease Other    Depression Other    Heart attack Other     Social History   Socioeconomic History   Marital status: Single    Spouse name: Not on file   Number of children: Not on file   Years of education: Not on file   Highest education level: Not on file  Occupational History   Not on file  Tobacco Use   Smoking status: Former    Types: Cigarettes     Quit date: 10/31/2010    Years since quitting: 11.0   Smokeless tobacco: Never  Vaping Use   Vaping Use: Never used  Substance and Sexual Activity   Alcohol use: No    Alcohol/week: 0.0 standard drinks of alcohol   Drug use: No   Sexual activity: Not Currently    Partners: Male    Birth control/protection: Injection  Other Topics Concern   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     Social Determinants of Health   Financial Resource Strain: Not on file  Food Insecurity: Not on file  Transportation Needs: Not on file  Physical Activity: Not on file  Stress: Not on file  Social Connections: Not on file    Allergies  Allergen Reactions   Bee Venom Hives and Swelling   Lisinopril Shortness Of Breath and Cough   Shellfish Allergy Anaphylaxis    Outpatient Medications Prior to Visit  Medication Sig Dispense Refill   albuterol (VENTOLIN HFA) 108 (90 Base) MCG/ACT inhaler Inhale 2 puffs into the lungs every 6 (six) hours as needed for wheezing or shortness of breath (Cough).  18 g 2   cetirizine (ZYRTEC ALLERGY) 10 MG tablet Take 1 tablet (10 mg total) by mouth at bedtime. 90 tablet 1   diclofenac Sodium (VOLTAREN) 1 % GEL Apply 4 g topically 4 (four) times daily. 150 g 0   fluticasone (FLONASE) 50 MCG/ACT nasal spray Place 1 spray into both nostrils daily. 48 g 1   gentamicin (GARAMYCIN) 0.3 % ophthalmic solution Place 2 drops into both eyes every 4 (four) hours. 15 mL 0   metFORMIN (GLUCOPHAGE) 500 MG tablet TAKE 2 TABLETS (1,000 MG TOTAL) BY MOUTH 2 (TWO) TIMES DAILY WITH A MEAL. 120 tablet 6   nirmatrelvir/ritonavir EUA (PAXLOVID) 20 x 150 MG & 10 x 100MG TABS Take nirmatrelvir (150 mg) two tablets twice daily for 5 days and ritonavir (100 mg) one tablet twice daily for 5 days. Renal function normal. 30 tablet 0   hydrochlorothiazide (HYDRODIURIL) 25 MG tablet TAKE 1 TABLET (25 MG TOTAL) BY MOUTH DAILY. 30 tablet 0    losartan (COZAAR) 50 MG tablet TAKE 1 TABLET (50 MG TOTAL) BY MOUTH DAILY. 30 tablet 0   No facility-administered medications prior to visit.     ROS Review of Systems  Constitutional:  Negative for activity change, appetite change and fatigue.  HENT:  Negative for congestion, sinus pressure and sore throat.   Eyes:  Positive for visual disturbance.  Respiratory:  Negative for cough, chest tightness, shortness of breath and wheezing.   Cardiovascular:  Negative for chest pain and palpitations.  Gastrointestinal:  Negative for abdominal distention, abdominal pain and constipation.  Endocrine: Negative for polydipsia.  Genitourinary:  Negative for dysuria and frequency.  Musculoskeletal:  Negative for arthralgias and back pain.  Skin:  Negative for rash.  Neurological:  Negative for tremors, light-headedness and numbness.  Hematological:  Does not bruise/bleed easily.  Psychiatric/Behavioral:  Negative for agitation and behavioral problems.     Objective:  BP (!) 142/84   Pulse 75   Temp 98.2 F (36.8 C) (Oral)   Ht 5' 3"  (1.6 m)   Wt (!) 313 lb 3.2 oz (142.1 kg)   SpO2 100%   BMI 55.48 kg/m      10/30/2021    9:23 AM 10/24/2021    3:20 PM 09/28/2021    7:08 PM  BP/Weight  Systolic BP 628 366 294  Diastolic BP 84 78 84  Wt. (Lbs) 313.2    BMI 55.48 kg/m2        Physical Exam Exam conducted with a chaperone present.  Constitutional:      General: She is not in acute distress.    Appearance: She is well-developed. She is obese. She is not diaphoretic.  HENT:     Head: Normocephalic.     Right Ear: External ear normal.     Left Ear: External ear normal.     Nose: Nose normal.  Eyes:     Pupils: Pupils are equal, round, and reactive to light.     Comments: Slight erythema of bulbar conjunctiva  Neck:     Vascular: No JVD.  Cardiovascular:     Rate and Rhythm: Normal rate and regular rhythm.     Heart sounds: Normal heart sounds. No murmur heard.    No gallop.   Pulmonary:     Effort: Pulmonary effort is normal. No respiratory distress.     Breath sounds: Normal breath sounds. No wheezing or rales.  Chest:     Chest wall: No tenderness.  Breasts:    Right:  Normal. No mass, nipple discharge or tenderness.     Left: Normal. No mass, nipple discharge or tenderness.  Abdominal:     General: Bowel sounds are normal. There is no distension.     Palpations: Abdomen is soft. There is no mass.     Tenderness: There is no abdominal tenderness.     Hernia: There is no hernia in the left inguinal area or right inguinal area.  Genitourinary:    General: Normal vulva.     Pubic Area: No rash.      Labia:        Right: No rash.        Left: No rash.      Vagina: Normal.     Cervix: Normal.     Uterus: Normal.      Adnexa: Right adnexa normal and left adnexa normal.       Right: No tenderness.         Left: No tenderness.    Musculoskeletal:        General: No tenderness. Normal range of motion.     Cervical back: Normal range of motion. No tenderness.  Lymphadenopathy:     Upper Body:     Right upper body: No supraclavicular or axillary adenopathy.     Left upper body: No supraclavicular or axillary adenopathy.  Skin:    General: Skin is warm and dry.  Neurological:     Mental Status: She is alert and oriented to person, place, and time.     Deep Tendon Reflexes: Reflexes are normal and symmetric.        Latest Ref Rng & Units 07/27/2020   11:03 AM 04/22/2019    4:02 PM 11/20/2018   10:33 AM  CMP  Glucose 65 - 99 mg/dL 106  92  105   BUN 6 - 20 mg/dL 10  10  15    Creatinine 0.57 - 1.00 mg/dL 0.65  0.62  0.69   Sodium 134 - 144 mmol/L 140  141  138   Potassium 3.5 - 5.2 mmol/L 3.8  4.2  4.0   Chloride 96 - 106 mmol/L 98  104  100   CO2 20 - 29 mmol/L 25  27  25    Calcium 8.7 - 10.2 mg/dL 9.8  9.9  9.9   Total Protein 6.0 - 8.5 g/dL 6.7  6.5    Total Bilirubin 0.0 - 1.2 mg/dL 0.3  0.3    Alkaline Phos 44 - 121 IU/L 83  85    AST 0 - 40  IU/L 16  13    ALT 0 - 32 IU/L 25  14      Lipid Panel     Component Value Date/Time   CHOL 143 07/23/2016 1508   TRIG 113 07/23/2016 1508   HDL 28 (L) 07/23/2016 1508   CHOLHDL 5.1 (H) 07/23/2016 1508   LDLCALC 92 07/23/2016 1508    CBC    Component Value Date/Time   WBC 8.1 07/27/2020 1103   WBC 7.9 11/11/2017 1616   RBC 4.31 07/27/2020 1103   RBC 4.37 11/11/2017 1616   HGB 10.5 (L) 07/27/2020 1103   HCT 33.3 (L) 07/27/2020 1103   PLT 380 07/27/2020 1103   MCV 77 (L) 07/27/2020 1103   MCH 24.4 (L) 07/27/2020 1103   MCH 24.7 (L) 11/11/2017 1616   MCHC 31.5 07/27/2020 1103   MCHC 29.9 (L) 11/11/2017 1616   RDW 14.7 07/27/2020 1103   LYMPHSABS 2.0 07/27/2020 1103  MONOABS 0.4 11/11/2017 1616   EOSABS 0.2 07/27/2020 1103   BASOSABS 0.0 07/27/2020 1103    Lab Results  Component Value Date   HGBA1C 6.2 07/27/2020    Assessment & Plan:  1. Annual physical exam Counseled on 150 minutes of exercise per week, healthy eating (including decreased daily intake of saturated fats, cholesterol, added sugars, sodium), STI prevention, routine healthcare maintenance. - Cervicovaginal ancillary only - LP+Non-HDL Cholesterol - CMP14+EGFR - CBC with Differential/Platelet  2. Encounter for screening mammogram for malignant neoplasm of breast - MM DIAG BREAST TOMO BILATERAL; Future  3. Screening for cervical cancer - Cytology - PAP  4. Prediabetes Last A1c was 6.2 We will repeat Continue lifestyle modification to prevent progression to type 2 diabetes mellitus - Hemoglobin A1c  5. Morbid obesity (HCC) Excessive eating and sedentary lifestyle largely responsible Counseled on beginning an exercise regimen and restricting caloric intake - Amb Ref to Medical Weight Management  6. Essential hypertension Uncontrolled due to running out of antihypertensive which I refilled Counseled on blood pressure goal of less than 130/80, low-sodium, DASH diet, medication compliance, 150  minutes of moderate intensity exercise per week. Discussed medication compliance, adverse effects. - hydrochlorothiazide (HYDRODIURIL) 25 MG tablet; TAKE 1 TABLET (25 MG TOTAL) BY MOUTH DAILY.  Dispense: 90 tablet; Refill: 1 - losartan (COZAAR) 50 MG tablet; TAKE 1 TABLET (50 MG TOTAL) BY MOUTH DAILY.  Dispense: 90 tablet; Refill: 1  7. Blurry vision, bilateral Recently completed eyedrops for treatment of conjunctivitis - Ambulatory referral to Ophthalmology  8. Need for hepatitis C screening test - HCV Ab w Reflex to Quant PCR  9. Encounter for Depo-Provera contraception - POCT urine pregnancy - medroxyPROGESTERone (DEPO-PROVERA) injection 150 mg   Meds ordered this encounter  Medications   hydrochlorothiazide (HYDRODIURIL) 25 MG tablet    Sig: TAKE 1 TABLET (25 MG TOTAL) BY MOUTH DAILY.    Dispense:  90 tablet    Refill:  1   losartan (COZAAR) 50 MG tablet    Sig: TAKE 1 TABLET (50 MG TOTAL) BY MOUTH DAILY.    Dispense:  90 tablet    Refill:  1   medroxyPROGESTERone (DEPO-PROVERA) injection 150 mg    Follow-up: Return in about 6 months (around 05/02/2022) for Chronic medical conditions.       Charlott Rakes, MD, FAAFP. [facility] [place], [place]   10/30/2021, 11:01 AM

## 2021-10-30 NOTE — Patient Instructions (Addendum)

## 2021-10-31 ENCOUNTER — Other Ambulatory Visit: Payer: Self-pay

## 2021-10-31 ENCOUNTER — Other Ambulatory Visit: Payer: Self-pay | Admitting: Family Medicine

## 2021-10-31 LAB — CMP14+EGFR
ALT: 16 IU/L (ref 0–32)
AST: 8 IU/L (ref 0–40)
Albumin/Globulin Ratio: 1.5 (ref 1.2–2.2)
Albumin: 4 g/dL (ref 3.9–4.9)
Alkaline Phosphatase: 86 IU/L (ref 44–121)
BUN/Creatinine Ratio: 24 — ABNORMAL HIGH (ref 9–23)
BUN: 16 mg/dL (ref 6–24)
Bilirubin Total: <0.2 mg/dL (ref 0.0–1.2)
CO2: 24 mmol/L (ref 20–29)
Calcium: 9.8 mg/dL (ref 8.7–10.2)
Chloride: 102 mmol/L (ref 96–106)
Creatinine, Ser: 0.66 mg/dL (ref 0.57–1.00)
Globulin, Total: 2.6 g/dL (ref 1.5–4.5)
Glucose: 117 mg/dL — ABNORMAL HIGH (ref 70–99)
Potassium: 4 mmol/L (ref 3.5–5.2)
Sodium: 140 mmol/L (ref 134–144)
Total Protein: 6.6 g/dL (ref 6.0–8.5)
eGFR: 114 mL/min/{1.73_m2} (ref >59)

## 2021-10-31 LAB — CERVICOVAGINAL ANCILLARY ONLY
Bacterial Vaginitis (gardnerella): NEGATIVE
Candida Glabrata: NEGATIVE
Candida Vaginitis: NEGATIVE
Chlamydia: NEGATIVE
Comment: NEGATIVE
Comment: NEGATIVE
Comment: NEGATIVE
Comment: NEGATIVE
Comment: NEGATIVE
Comment: NORMAL
Neisseria Gonorrhea: NEGATIVE
Trichomonas: NEGATIVE

## 2021-10-31 LAB — CBC WITH DIFFERENTIAL/PLATELET
Basophils Absolute: 0.1 10*3/uL (ref 0.0–0.2)
Basos: 1 %
EOS (ABSOLUTE): 0.3 10*3/uL (ref 0.0–0.4)
Eos: 3 %
Hematocrit: 34.6 % (ref 34.0–46.6)
Hemoglobin: 10.6 g/dL — ABNORMAL LOW (ref 11.1–15.9)
Immature Grans (Abs): 0.1 10*3/uL (ref 0.0–0.1)
Immature Granulocytes: 1 %
Lymphocytes Absolute: 2.7 10*3/uL (ref 0.7–3.1)
Lymphs: 29 %
MCH: 24 pg — ABNORMAL LOW (ref 26.6–33.0)
MCHC: 30.6 g/dL — ABNORMAL LOW (ref 31.5–35.7)
MCV: 78 fL — ABNORMAL LOW (ref 79–97)
Monocytes Absolute: 0.5 10*3/uL (ref 0.1–0.9)
Monocytes: 5 %
Neutrophils Absolute: 5.8 10*3/uL (ref 1.4–7.0)
Neutrophils: 61 %
Platelets: 387 10*3/uL (ref 150–450)
RBC: 4.42 x10E6/uL (ref 3.77–5.28)
RDW: 15.2 % (ref 11.7–15.4)
WBC: 9.3 10*3/uL (ref 3.4–10.8)

## 2021-10-31 LAB — LP+NON-HDL CHOLESTEROL
Cholesterol, Total: 194 mg/dL (ref 100–199)
HDL: 34 mg/dL — ABNORMAL LOW (ref >39)
LDL Chol Calc (NIH): 140 mg/dL — ABNORMAL HIGH (ref 0–99)
Total Non-HDL-Chol (LDL+VLDL): 160 mg/dL — ABNORMAL HIGH (ref 0–129)
Triglycerides: 108 mg/dL (ref 0–149)
VLDL Cholesterol Cal: 20 mg/dL (ref 5–40)

## 2021-10-31 LAB — HCV INTERPRETATION

## 2021-10-31 LAB — HCV AB W REFLEX TO QUANT PCR: HCV Ab: NONREACTIVE

## 2021-10-31 LAB — HEMOGLOBIN A1C
Est. average glucose Bld gHb Est-mCnc: 126 mg/dL
Hgb A1c MFr Bld: 6 % — ABNORMAL HIGH (ref 4.8–5.6)

## 2021-10-31 MED ORDER — IRON (FERROUS SULFATE) 325 (65 FE) MG PO TABS
325.0000 mg | ORAL_TABLET | Freq: Every day | ORAL | 3 refills | Status: AC
  Filled 2021-10-31: qty 60, fill #0
  Filled 2021-11-26: qty 100, 100d supply, fill #0

## 2021-10-31 MED ORDER — ATORVASTATIN CALCIUM 20 MG PO TABS
20.0000 mg | ORAL_TABLET | Freq: Every day | ORAL | 3 refills | Status: DC
  Filled 2021-10-31 – 2021-11-26 (×2): qty 30, 30d supply, fill #0
  Filled 2022-01-04: qty 30, 30d supply, fill #1

## 2021-11-05 LAB — CYTOLOGY - PAP
Comment: NEGATIVE
Comment: NEGATIVE
Comment: NEGATIVE
Diagnosis: UNDETERMINED — AB
HPV 16: NEGATIVE
HPV 18 / 45: POSITIVE — AB
High risk HPV: POSITIVE — AB

## 2021-11-06 ENCOUNTER — Other Ambulatory Visit: Payer: Self-pay | Admitting: Family Medicine

## 2021-11-06 ENCOUNTER — Telehealth: Payer: Self-pay | Admitting: Emergency Medicine

## 2021-11-06 DIAGNOSIS — D649 Anemia, unspecified: Secondary | ICD-10-CM

## 2021-11-06 DIAGNOSIS — R87610 Atypical squamous cells of undetermined significance on cytologic smear of cervix (ASC-US): Secondary | ICD-10-CM

## 2021-11-06 NOTE — Telephone Encounter (Signed)
Copied from CRM [id number]. Topic: General - Other >> Nov 06, 2021 10:23 AM Macon Large wrote: Reason for CRM: Pt request return call to go over her most recent lab results. Cb# [phone]

## 2021-11-07 ENCOUNTER — Other Ambulatory Visit: Payer: Self-pay

## 2021-11-13 NOTE — Telephone Encounter (Signed)
Pt was called and given lab results again. Patient was also given the number to [facility] to call.

## 2021-11-14 ENCOUNTER — Encounter (INDEPENDENT_AMBULATORY_CARE_PROVIDER_SITE_OTHER): Payer: Self-pay

## 2021-11-26 ENCOUNTER — Other Ambulatory Visit (HOSPITAL_COMMUNITY): Payer: Self-pay

## 2021-11-26 ENCOUNTER — Other Ambulatory Visit: Payer: Self-pay

## 2021-11-27 ENCOUNTER — Other Ambulatory Visit (HOSPITAL_COMMUNITY): Payer: Self-pay

## 2021-12-07 ENCOUNTER — Ambulatory Visit: Payer: Medicaid Other

## 2022-01-03 ENCOUNTER — Ambulatory Visit: Payer: Medicaid Other

## 2022-01-04 ENCOUNTER — Other Ambulatory Visit: Payer: Self-pay

## 2022-01-17 ENCOUNTER — Ambulatory Visit (HOSPITAL_COMMUNITY)
Admission: EM | Admit: 2022-01-17 | Discharge: 2022-01-17 | Disposition: A | Discharge status: Home / Self Care | Payer: Medicaid Other | Attending: Physician Assistant | Admitting: Physician Assistant

## 2022-01-17 ENCOUNTER — Encounter (HOSPITAL_COMMUNITY): Payer: Self-pay

## 2022-01-17 DIAGNOSIS — H6993 Unspecified Eustachian tube disorder, bilateral: Secondary | ICD-10-CM | POA: Diagnosis not present

## 2022-01-17 DIAGNOSIS — R0981 Nasal congestion: Secondary | ICD-10-CM

## 2022-01-17 MED ORDER — PREDNISONE 20 MG PO TABS: 40.0000 mg | ORAL_TABLET | Freq: Every day | ORAL | 0 refills | Status: AC

## 2022-01-17 MED ORDER — CETIRIZINE HCL 10 MG PO TABS
10.0000 mg | ORAL_TABLET | Freq: Every day | ORAL | 0 refills | Status: DC
  Filled 2022-01-24: qty 30, 30d supply, fill #0

## 2022-01-17 MED ORDER — FLUTICASONE PROPIONATE 50 MCG/ACT NA SUSP
1.0000 | Freq: Every day | NASAL | 0 refills | Status: AC
Start: 2022-01-17 — End: ?
  Filled 2022-01-24: qty 16, 30d supply, fill #0

## 2022-01-17 NOTE — ED Triage Notes (Signed)
Pt presents with 3 days of head congestion. Pt says her entire head feels full.

## 2022-01-17 NOTE — Discharge Instructions (Signed)
I do not see any evidence of infection.  I believe that you have sinus congestion either caused by allergies or mild virus.  Start your allergy medication as previously prescribed.  I sent refills to your pharmacy.  I would also like you to start prednisone.  Do not take NSAIDs including aspirin, ibuprofen/Advil, naproxen/Aleve with this medication as it can cause stomach bleeding.  You can use acetaminophen/Tylenol.  Make sure you rest and drink plenty of fluid.  Use sinus rinses and nasal saline.  If your symptoms are not improving quickly please follow-up with the ENT.  Call them to schedule an appointment.  If anything worsens be seen immediately.

## 2022-01-17 NOTE — ED Provider Notes (Signed)
[place]    CSN: [id number] Arrival date & time: 01/17/22  1550      History   Chief Complaint Chief Complaint  Patient presents with   Nasal Congestion    HPI Crystal Burns is a 41 y.o. female.   Patient presents today with 3-day history of congestion symptoms.  She reports nasal congestion, ear fullness, otalgia, headache, rhinorrhea, fatigue.  Denies any cough, chest pain, shortness of breath, nausea, vomiting.  Denies any known sick contacts but does work in [employer].  She does have a history of allergies typically managed with cetirizine and Flonase but has not been taking this regularly.  She also has a history of asthma managed with albuterol but has not required albuterol recently.  Denies any recent steroids or antibiotic use.  She denies history of smoking.    Past Medical History:  Diagnosis Date   Asthma    2010-only bothers pt during bad cold   Depression    2010-had when mother passed away   History of gonorrhea    Hypertension    2004   Obesity    Pre-diabetes    Preeclampsia complicating hypertension 01/21/2015    Patient Active Problem List   Diagnosis Date Noted   Bilateral primary osteoarthritis of knee 02/28/2020   Precordial chest pain 05/03/2019   Educated about COVID-19 virus infection 05/03/2019   Hoarseness of voice 03/17/2018   Gastroesophageal reflux disease without esophagitis 03/17/2018   Supervision of normal pregnancy, antepartum 12/15/2017   Acute medial meniscus tear of right knee 02/10/2017   Vitamin D deficiency 07/10/2015   Anxiety 06/20/2015   Costochondritis 05/18/2015   Primary osteoarthritis of right knee 04/22/2014   Essential hypertension 07/18/2012   LUMBOSACRAL STRAIN, ACUTE 07/05/2008   Morbid obesity ([place]) 02/24/2008   Asthma with exacerbation 02/24/2008    Past Surgical History:  Procedure Laterality Date   CHOLECYSTECTOMY     DILATION AND CURETTAGE OF UTERUS     WISDOM TOOTH  EXTRACTION      OB History     Gravida  11   Para  7   Term  7   Preterm  0   AB  4   Living  6      SAB  4   IAB  0   Ectopic  0   Multiple  0   Live Births  7            Home Medications    Prior to Admission medications   Medication Sig Start Date End Date Taking? Authorizing Provider  predniSONE (DELTASONE) 20 MG tablet Take 2 tablets (40 mg total) by mouth daily for 5 days. 01/17/22 01/22/22 Yes Ambra Haverstick K, PA-C  albuterol (VENTOLIN HFA) 108 (90 Base) MCG/ACT inhaler Inhale 2 puffs into the lungs every 6 (six) hours as needed for wheezing or shortness of breath (Cough). 08/13/21   Lynden Oxford Scales, PA-C  atorvastatin (LIPITOR) 20 MG tablet Take 1 tablet (20 mg total) by mouth daily. 10/31/21   Charlott Rakes, MD  cetirizine (ZYRTEC ALLERGY) 10 MG tablet Take 1 tablet (10 mg total) by mouth at bedtime. 01/17/22 07/16/22  Asmi Fugere, Derry Skill, PA-C  diclofenac Sodium (VOLTAREN) 1 % GEL Apply 4 g topically 4 (four) times daily. 09/25/20   Chase Picket, MD  fluticasone (FLONASE) 50 MCG/ACT nasal spray Place 1 spray into both nostrils daily. 01/17/22   Sabrinia Prien, Derry Skill, PA-C  gentamicin (GARAMYCIN) 0.3 % ophthalmic solution Place  2 drops into both eyes every 4 (four) hours. 10/24/21   Rising, Lurena Joiner, PA-C  hydrochlorothiazide (HYDRODIURIL) 25 MG tablet TAKE 1 TABLET (25 MG TOTAL) BY MOUTH DAILY. 10/30/21 10/30/22  Hoy Register, MD  Iron, Ferrous Sulfate, 325 (65 Fe) MG TABS Take 1 tablet by mouth daily. 10/31/21   Hoy Register, MD  losartan (COZAAR) 50 MG tablet TAKE 1 TABLET (50 MG TOTAL) BY MOUTH DAILY. 10/30/21 10/30/22  Hoy Register, MD  metFORMIN (GLUCOPHAGE) 500 MG tablet TAKE 2 TABLETS (1,000 MG TOTAL) BY MOUTH 2 (TWO) TIMES DAILY WITH A MEAL. 08/24/21 08/24/22  Hoy Register, MD  nirmatrelvir/ritonavir EUA (PAXLOVID) 20 x 150 MG & 10 x 100MG  TABS Take nirmatrelvir (150 mg) two tablets twice daily for 5 days and ritonavir (100 mg) one tablet twice daily  for 5 days. Renal function normal. 08/31/21   Banister, 09/02/21, MD    Family History Family History  Problem Relation Age of Onset   Breast cancer Mother    Hypertension Mother    Diabetes Mother    Cancer Mother        Breast    Heart attack Mother 91   Hypertension Father    Diabetes Father    Hyperlipidemia Father    Arthritis Other    Asthma Other    Breast cancer Other    Heart failure Other    Congenital heart disease Other    Depression Other    Heart attack Other     Social History Social History   Tobacco Use   Smoking status: Former    Types: Cigarettes    Quit date: 10/31/2010    Years since quitting: 11.2   Smokeless tobacco: Never  Vaping Use   Vaping Use: Never used  Substance Use Topics   Alcohol use: No    Alcohol/week: 0.0 standard drinks of alcohol   Drug use: No     Allergies   Bee venom, Lisinopril, and Shellfish allergy   Review of Systems Review of Systems  Constitutional:  Positive for activity change and fatigue. Negative for appetite change and fever.  HENT:  Positive for congestion, ear pain and sinus pressure. Negative for postnasal drip, sneezing and sore throat.   Respiratory:  Negative for cough and shortness of breath.   Cardiovascular:  Negative for chest pain.  Gastrointestinal:  Negative for abdominal pain, diarrhea, nausea and vomiting.  Neurological:  Positive for headaches. Negative for dizziness and light-headedness.     Physical Exam Triage Vital Signs ED Triage Vitals [01/17/22 1750]  Enc Vitals Group     BP 109/66     Pulse Rate 69     Resp 19     Temp 98.6 F (37 C)     Temp src      SpO2 100 %     Weight      Height      Head Circumference      Peak Flow      Pain Score 8     Pain Loc      Pain Edu?      Excl. in GC?    No data found.  Updated Vital Signs BP 109/66   Pulse 69   Temp 98.6 F (37 C)   Resp 19   SpO2 100%   Visual Acuity Right Eye Distance:   Left Eye Distance:    Bilateral Distance:    Right Eye Near:   Left Eye Near:    Bilateral Near:  Physical Exam Vitals reviewed.  Constitutional:      General: She is awake. She is not in acute distress.    Appearance: Normal appearance. She is well-developed. She is not ill-appearing.     Comments: Very pleasant female appears stated age in no acute distress sitting comfortably in exam room  HENT:     Head: Normocephalic and atraumatic.     Right Ear: Ear canal and external ear normal. A middle ear effusion is present. Tympanic membrane is not erythematous or bulging.     Left Ear: Ear canal and external ear normal. A middle ear effusion is present. Tympanic membrane is not erythematous or bulging.     Nose:     Right Sinus: Maxillary sinus tenderness present. No frontal sinus tenderness.     Left Sinus: Maxillary sinus tenderness present. No frontal sinus tenderness.     Mouth/Throat:     Pharynx: Uvula midline. Posterior oropharyngeal erythema present. No oropharyngeal exudate.  Cardiovascular:     Rate and Rhythm: Normal rate and regular rhythm.     Heart sounds: Normal heart sounds, S1 normal and S2 normal. No murmur heard. Pulmonary:     Effort: Pulmonary effort is normal.     Breath sounds: Normal breath sounds. No wheezing, rhonchi or rales.     Comments: Clear to auscultation bilaterally Psychiatric:        Behavior: Behavior is cooperative.      UC Treatments / Results  Labs (all labs ordered are listed, but only abnormal results are displayed) Labs Reviewed - No data to display  EKG   Radiology No results found.  Procedures Procedures (including critical care time)  Medications Ordered in UC Medications - No data to display  Initial Impression / Assessment and Plan / UC Course  I have reviewed the triage vital signs and the nursing notes.  Pertinent labs & imaging results that were available during my care of the patient were reviewed by me and considered in my medical  decision making (see chart for details).     Patient is well appearing, afebrile, nontoxic, nontachycardic.  No evidence of acute infection on physical exam that warrant initiation of antibiotics.  Discussed symptoms are likely related to allergies versus mild viral infection.  We will restart allergy medication as previously prescribed.  I suspect her ear pain is related to eustachian tube dysfunction and we will use prednisone burst to help manage the symptoms.  Discussed that she should not take NSAIDs with prednisone due to risk of GI bleeding.  She does have a history of prediabetes with last A1c 6.0%; discussed that this can cause hyperglycemia and she should avoid carbohydrates and drink plenty of fluid while on this medication.  Discussed that if she has any persistent or worsening symptoms she is to return for reevaluation.  She was given contact information for local ENT with instruction to call to schedule appointment as needed.  Strict return precautions given.  Work excuse note provided.  Final Clinical Impressions(s) / UC Diagnoses   Final diagnoses:  Sinus congestion  Eustachian tube dysfunction, bilateral     Discharge Instructions      I do not see any evidence of infection.  I believe that you have sinus congestion either caused by allergies or mild virus.  Start your allergy medication as previously prescribed.  I sent refills to your pharmacy.  I would also like you to start prednisone.  Do not take NSAIDs including aspirin, ibuprofen/Advil, naproxen/Aleve with this  medication as it can cause stomach bleeding.  You can use acetaminophen/Tylenol.  Make sure you rest and drink plenty of fluid.  Use sinus rinses and nasal saline.  If your symptoms are not improving quickly please follow-up with the ENT.  Call them to schedule an appointment.  If anything worsens be seen immediately.     ED Prescriptions     Medication Sig Dispense Auth. Provider   fluticasone (FLONASE) 50  MCG/ACT nasal spray Place 1 spray into both nostrils daily. 16 g Soffia Doshier K, PA-C   cetirizine (ZYRTEC ALLERGY) 10 MG tablet Take 1 tablet (10 mg total) by mouth at bedtime. 30 tablet Offie Waide K, PA-C   predniSONE (DELTASONE) 20 MG tablet Take 2 tablets (40 mg total) by mouth daily for 5 days. 10 tablet Adyen Bifulco, Noberto Retort, PA-C      PDMP not reviewed this encounter.   Jeani Hawking, PA-C 01/17/22 1819

## 2022-01-23 ENCOUNTER — Other Ambulatory Visit: Payer: Self-pay

## 2022-01-23 ENCOUNTER — Other Ambulatory Visit (HOSPITAL_COMMUNITY): Payer: Self-pay

## 2022-01-24 ENCOUNTER — Other Ambulatory Visit: Payer: Self-pay

## 2022-01-24 MED ORDER — PREDNISONE 20 MG PO TABS
40.0000 mg | ORAL_TABLET | Freq: Every day | ORAL | 0 refills | Status: AC
  Filled 2022-01-24 – 2022-02-19 (×2): qty 10, 5d supply, fill #0

## 2022-01-28 ENCOUNTER — Other Ambulatory Visit: Payer: Self-pay

## 2022-01-28 MED ORDER — METFORMIN HCL 500 MG PO TABS
1000.0000 mg | ORAL_TABLET | Freq: Two times a day (BID) | ORAL | 6 refills | Status: DC
Start: 2022-01-04 — End: 2023-02-21
  Filled 2022-01-28 – 2022-02-19 (×2): qty 120, 30d supply, fill #0
  Filled 2022-04-25: qty 120, 30d supply, fill #1
  Filled 2022-05-23: qty 120, 30d supply, fill #2
  Filled 2022-07-24 – 2022-08-26 (×2): qty 120, 30d supply, fill #3
  Filled 2022-10-25 – 2022-11-11 (×3): qty 120, 30d supply, fill #4
  Filled 2022-12-24: qty 120, 30d supply, fill #5

## 2022-01-29 ENCOUNTER — Other Ambulatory Visit: Payer: Self-pay

## 2022-01-30 ENCOUNTER — Other Ambulatory Visit: Payer: Self-pay

## 2022-02-05 ENCOUNTER — Other Ambulatory Visit: Payer: Self-pay

## 2022-02-07 ENCOUNTER — Other Ambulatory Visit: Payer: Self-pay

## 2022-02-19 ENCOUNTER — Other Ambulatory Visit: Payer: Self-pay

## 2022-02-20 ENCOUNTER — Other Ambulatory Visit: Payer: Self-pay

## 2022-03-20 ENCOUNTER — Encounter (HOSPITAL_COMMUNITY): Payer: Self-pay

## 2022-03-20 ENCOUNTER — Ambulatory Visit (HOSPITAL_COMMUNITY)
Admission: EM | Admit: 2022-03-20 | Discharge: 2022-03-20 | Disposition: A | Discharge status: Home / Self Care | Payer: Medicaid Other | Attending: Family Medicine | Admitting: Family Medicine

## 2022-03-20 DIAGNOSIS — Z1152 Encounter for screening for COVID-19: Secondary | ICD-10-CM | POA: Insufficient documentation

## 2022-03-20 DIAGNOSIS — J069 Acute upper respiratory infection, unspecified: Secondary | ICD-10-CM | POA: Diagnosis not present

## 2022-03-20 DIAGNOSIS — R059 Cough, unspecified: Secondary | ICD-10-CM | POA: Diagnosis not present

## 2022-03-20 LAB — RESP PANEL BY RT-PCR (FLU A&B, COVID) ARPGX2
Influenza A by PCR: NEGATIVE
Influenza B by PCR: NEGATIVE
SARS Coronavirus 2 by RT PCR: NEGATIVE

## 2022-03-20 MED ORDER — PROMETHAZINE-DM 6.25-15 MG/5ML PO SYRP: 5.0000 mL | ORAL_SOLUTION | Freq: Four times a day (QID) | ORAL | 0 refills | Status: DC | PRN

## 2022-03-20 NOTE — Discharge Instructions (Signed)
You have been tested for COVID-19/influenza today. If your test returns positive, you will receive a phone call from [facility] regarding your results. Negative test results are not called. Both positive and negative results area always visible on MyChart. If you do not have a MyChart account, sign up instructions are provided in your discharge papers. Please do not hesitate to contact us should you have questions or concerns.  

## 2022-03-20 NOTE — ED Triage Notes (Signed)
Cough, body aches, ear pain, diarrhea, nausea, back pain, headache, fatigue that started 3 days ago. Taking tylenol, OTC cough medication to help.

## 2022-03-20 NOTE — ED Provider Notes (Signed)
[facility]   [id number] 03/20/22 Arrival Time: 1422  ASSESSMENT & PLAN:  1. Viral URI with cough    Discussed typical duration of likely viral illness. Pending: Labs Reviewed  RESP PANEL BY RT-PCR (FLU A&B, COVID) ARPGX2   OTC symptom care as needed.  Discharge Medication List as of 03/20/2022  4:51 PM     START taking these medications   Details  promethazine-dextromethorphan (PROMETHAZINE-DM) 6.25-15 MG/5ML syrup Take 5 mLs by mouth 4 (four) times daily as needed for cough., Starting Wed 03/20/2022, Normal         Follow-up Information     Hoy Register, MD.   Specialty: Family Medicine Why: As needed. Contact information: [address] [place] [place] [address] [phone]                 Reviewed expectations re: course of current medical issues. Questions answered. Outlined signs and symptoms indicating need for more acute intervention. Understanding verbalized. After Visit Summary given.   SUBJECTIVE: History from: Patient. Crystal Burns is a 41 y.o. female. Reports: Cough, body aches, ear pain, diarrhea, nausea, back pain, headache, fatigue that started 3 days ago. Taking tylenol, OTC cough medication to help.  Children with same. Denies: fever. Normal PO intake without n/v/d.  OBJECTIVE:  Vitals:   03/20/22 1634  BP: (!) 142/94  Pulse: 75  Resp: 16  Temp: 98.2 F (36.8 C)  TempSrc: Oral  SpO2: 100%    General appearance: alert; no distress Eyes: PERRLA; EOMI; conjunctiva normal HENT: [place]; AT; with nasal congestion Neck: supple  Lungs: speaks full sentences without difficulty; unlabored; CTAB Extremities: no edema Skin: warm and dry Neurologic: normal gait Psychological: alert and cooperative; normal mood and affect  Labs:  Labs Reviewed  RESP PANEL BY RT-PCR (FLU A&B, COVID) ARPGX2     Allergies  Allergen Reactions   Bee Venom Hives and Swelling   Lisinopril Shortness Of Breath and Cough    Shellfish Allergy Anaphylaxis    Past Medical History:  Diagnosis Date   Asthma    2010-only bothers pt during bad cold   Depression    2010-had when mother passed away   History of gonorrhea    Hypertension    2004   Obesity    Pre-diabetes    Preeclampsia complicating hypertension 01/21/2015   Social History   Socioeconomic History   Marital status: Single    Spouse name: Not on file   Number of children: Not on file   Years of education: Not on file   Highest education level: Not on file  Occupational History   Not on file  Tobacco Use   Smoking status: Former    Types: Cigarettes    Quit date: 10/31/2010    Years since quitting: 11.3   Smokeless tobacco: Never  Vaping Use   Vaping Use: Never used  Substance and Sexual Activity   Alcohol use: No    Alcohol/week: 0.0 standard drinks of alcohol   Drug use: No   Sexual activity: Not Currently    Partners: Male    Birth control/protection: Injection  Other Topics Concern   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     Social Determinants of Health   Financial Resource Strain: Not on file  Food Insecurity: Not on file  Transportation Needs: Not on file  Physical Activity: Not on file  Stress: Not on file  Social Connections: Not on file  Intimate Partner Violence: Not on file   Family History  Problem Relation Age of Onset   Breast cancer Mother    Hypertension Mother    Diabetes Mother    Cancer Mother        Breast    Heart attack Mother 53   Hypertension Father    Diabetes Father    Hyperlipidemia Father    Arthritis Other    Asthma Other    Breast cancer Other    Heart failure Other    Congenital heart disease Other    Depression Other    Heart attack Other    Past Surgical History:  Procedure Laterality Date   CHOLECYSTECTOMY     DILATION AND CURETTAGE OF UTERUS     WISDOM TOOTH EXTRACTION       Mardella Layman, MD 03/20/22  1718

## 2022-03-26 ENCOUNTER — Other Ambulatory Visit: Payer: Self-pay

## 2022-03-26 ENCOUNTER — Ambulatory Visit: Payer: Medicaid Other | Admitting: Orthopaedic Surgery

## 2022-03-27 ENCOUNTER — Ambulatory Visit: Payer: Medicaid Other | Admitting: Obstetrics and Gynecology

## 2022-04-25 ENCOUNTER — Other Ambulatory Visit: Payer: Self-pay | Admitting: Family Medicine

## 2022-04-25 ENCOUNTER — Other Ambulatory Visit: Payer: Self-pay

## 2022-04-25 DIAGNOSIS — I10 Essential (primary) hypertension: Secondary | ICD-10-CM

## 2022-04-25 MED ORDER — HYDROCHLOROTHIAZIDE 25 MG PO TABS
25.0000 mg | ORAL_TABLET | Freq: Every day | ORAL | 0 refills | Status: DC
  Filled 2022-04-25: qty 90, fill #0
  Filled 2022-05-23: qty 90, 90d supply, fill #0

## 2022-04-25 MED ORDER — LOSARTAN POTASSIUM 50 MG PO TABS
50.0000 mg | ORAL_TABLET | Freq: Every day | ORAL | 11 refills | Status: DC
  Filled 2022-04-25: qty 30, 30d supply, fill #0
  Filled 2022-05-23: qty 30, 30d supply, fill #1
  Filled 2022-06-25: qty 30, 30d supply, fill #2
  Filled 2022-07-24: qty 30, 30d supply, fill #3
  Filled 2022-08-26: qty 30, 30d supply, fill #4
  Filled 2022-09-26: qty 30, 30d supply, fill #5
  Filled 2022-10-25 (×2): qty 30, 30d supply, fill #6
  Filled 2022-11-22 (×2): qty 30, 30d supply, fill #7
  Filled 2022-12-24: qty 30, 30d supply, fill #8
  Filled 2023-01-21: qty 30, 30d supply, fill #9
  Filled 2023-02-21: qty 30, 30d supply, fill #10
  Filled 2023-03-21: qty 30, 30d supply, fill #11

## 2022-04-25 NOTE — Telephone Encounter (Signed)
Requested Prescriptions  Pending Prescriptions Disp Refills   hydrochlorothiazide (HYDRODIURIL) 25 MG tablet 90 tablet 0    Sig: TAKE 1 TABLET (25 MG TOTAL) BY MOUTH DAILY.     Cardiovascular: Diuretics - Thiazide Failed - 04/25/2022  3:40 PM      Failed - Last BP in normal range    BP Readings from Last 1 Encounters:  03/20/22 (!) 142/94         Passed - Cr in normal range and within 180 days    Creat  Date Value Ref Range Status  04/22/2014 0.59 0.50 - 1.10 mg/dL Final   Creatinine, Ser  Date Value Ref Range Status  10/30/2021 0.66 0.57 - 1.00 mg/dL Final   Creatinine, Urine  Date Value Ref Range Status  02/06/2015 41.00 mg/dL Final         Passed - K in normal range and within 180 days    Potassium  Date Value Ref Range Status  10/30/2021 4.0 3.5 - 5.2 mmol/L Final         Passed - Na in normal range and within 180 days    Sodium  Date Value Ref Range Status  10/30/2021 140 134 - 144 mmol/L Final         Passed - Valid encounter within last 6 months    Recent Outpatient Visits           5 months ago Annual physical exam   [place], Enobong, MD   1 year ago Prediabetes   [place], Enobong, MD   2 years ago Right ear pain   [place], MD   2 years ago Upper respiratory tract infection, unspecified type   [place], Dionne Bucy, [place]   2 years ago No-show for appointment   [place], MD               losartan (COZAAR) 50 MG tablet 90 tablet 0    Sig: TAKE 1 TABLET (50 MG TOTAL) BY MOUTH DAILY.     Cardiovascular:  Angiotensin Receptor Blockers Failed - 04/25/2022  3:40 PM      Failed - Last BP in normal range    BP Readings from Last 1 Encounters:  03/20/22 (!) 142/94         Passed - Cr in normal range and within 180 days    Creat  Date Value  Ref Range Status  04/22/2014 0.59 0.50 - 1.10 mg/dL Final   Creatinine, Ser  Date Value Ref Range Status  10/30/2021 0.66 0.57 - 1.00 mg/dL Final   Creatinine, Urine  Date Value Ref Range Status  02/06/2015 41.00 mg/dL Final         Passed - K in normal range and within 180 days    Potassium  Date Value Ref Range Status  10/30/2021 4.0 3.5 - 5.2 mmol/L Final         Passed - Patient is not pregnant      Passed - Valid encounter within last 6 months    Recent Outpatient Visits           5 months ago Annual physical exam   [place], Enobong, MD   1 year ago Prediabetes   [place], Enobong, MD   2 years ago  Right ear pain   [place], MD   2 years ago Upper respiratory tract infection, unspecified type   [place], [place]   2 years ago No-show for appointment   [facility] Antony Blackbird, MD

## 2022-04-26 ENCOUNTER — Other Ambulatory Visit: Payer: Self-pay

## 2022-05-21 ENCOUNTER — Ambulatory Visit (INDEPENDENT_AMBULATORY_CARE_PROVIDER_SITE_OTHER): Payer: Medicaid Other

## 2022-05-21 ENCOUNTER — Ambulatory Visit: Payer: Medicaid Other | Admitting: Physician Assistant

## 2022-05-21 DIAGNOSIS — M25562 Pain in left knee: Secondary | ICD-10-CM

## 2022-05-21 DIAGNOSIS — M1711 Unilateral primary osteoarthritis, right knee: Secondary | ICD-10-CM | POA: Diagnosis not present

## 2022-05-21 DIAGNOSIS — M170 Bilateral primary osteoarthritis of knee: Secondary | ICD-10-CM | POA: Diagnosis not present

## 2022-05-21 DIAGNOSIS — M25561 Pain in right knee: Secondary | ICD-10-CM | POA: Diagnosis not present

## 2022-05-21 DIAGNOSIS — M1712 Unilateral primary osteoarthritis, left knee: Secondary | ICD-10-CM

## 2022-05-21 MED ORDER — METHYLPREDNISOLONE ACETATE 40 MG/ML IJ SUSP
13.3300 mg | INTRAMUSCULAR | Status: AC | PRN
  Administered 2022-05-21: 13.33 mg via INTRA_ARTICULAR

## 2022-05-21 MED ORDER — DICLOFENAC SODIUM 75 MG PO TBEC: 75.0000 mg | DELAYED_RELEASE_TABLET | Freq: Two times a day (BID) | ORAL | 2 refills | Status: DC | PRN

## 2022-05-21 MED ORDER — BUPIVACAINE HCL 0.25 % IJ SOLN
0.6600 mL | INTRAMUSCULAR | Status: AC | PRN
  Administered 2022-05-21: .66 mL via INTRA_ARTICULAR

## 2022-05-21 MED ORDER — LIDOCAINE HCL 1 % IJ SOLN
3.0000 mL | INTRAMUSCULAR | Status: AC | PRN
  Administered 2022-05-21: 3 mL

## 2022-05-21 MED ORDER — TRAMADOL HCL 50 MG PO TABS: 50.0000 mg | ORAL_TABLET | Freq: Three times a day (TID) | ORAL | 2 refills | Status: DC | PRN

## 2022-05-21 NOTE — Progress Notes (Signed)
Office Visit Note   Patient: Crystal Burns           Date of Birth: 14-Dec-1980           MRN: [id number] Visit Date: 05/21/2022              Requested by: Charlott Rakes, MD [address] [place],  [place] [address] PCP: Charlott Rakes, MD   Assessment & Plan: Visit Diagnoses:  1. Bilateral primary osteoarthritis of knee     Plan: Impression is bilateral knee osteoarthritis.  Today we discussed various treatment options to include cortisone versus viscosupplementation injection versus joint replacement surgery at some point the future.  Have also discussed weight loss and how this will help with her pain.  She is elected to proceed with bilateral knee cortisone injections today.  She will follow-up with [place] as needed.  Follow-Up Instructions: Return if symptoms worsen or fail to improve.   Orders:  Orders Placed This Encounter  Procedures   XR KNEE 3 VIEW RIGHT   XR KNEE 3 VIEW LEFT   Meds ordered this encounter  Medications   traMADol (ULTRAM) 50 MG tablet    Sig: Take 1 tablet (50 mg total) by mouth 3 (three) times daily as needed.    Dispense:  30 tablet    Refill:  2   diclofenac (VOLTAREN) 75 MG EC tablet    Sig: Take 1 tablet (75 mg total) by mouth 2 (two) times daily as needed.    Dispense:  60 tablet    Refill:  2      Procedures: Large Joint Inj: bilateral knee on 05/21/2022 11:34 AM Indications: pain Details: 22 G needle, anterolateral approach Medications (Right): 0.66 mL bupivacaine 0.25 %; 3 mL lidocaine 1 %; 13.33 mg methylPREDNISolone acetate 40 MG/ML Medications (Left): 0.66 mL bupivacaine 0.25 %; 3 mL lidocaine 1 %; 13.33 mg methylPREDNISolone acetate 40 MG/ML      Clinical Data: No additional findings.   Subjective: Chief Complaint  Patient presents with   Right Knee - Pain   Left Knee - Pain    HPI patient is a pleasant 42 year old female who comes in today with bilateral knee pain right greater than left.  Symptoms  have been ongoing for years but worsened several months ago when she picked up a part-time job at [employer].  She has been complaining of pain to the entire aspect of both knees worse going from a seated to standing position as well as when she is walking for a long period of time.  She has been taking Tylenol and ibuprofen without relief.  She does note remote cortisone injections which have helped quite a bit in the past.  No previous viscosupplementation injection.  Review of Systems as detailed in HPI.  All others reviewed and are negative.   Objective: Vital Signs: There were no vitals taken for this visit.  Physical Exam developed well-nourished female in no acute distress.  Alert and oriented x 3.  Ortho Exam bilateral knee exam shows trace effusion.  Range of motion 0 to 100 degrees.  Medial joint line tenderness.  Moderate patellofemoral crepitus.  She is neurovascular intact distally.  Specialty Comments:  No specialty comments available.  Imaging: XR KNEE 3 VIEW RIGHT  Result Date: 05/21/2022 Advanced medial compartment degenerative changes  XR KNEE 3 VIEW LEFT  Result Date: 05/21/2022 Moderate medial patellofemoral degenerative changes    PMFS History: Patient Active Problem List   Diagnosis Date  Noted   Bilateral primary osteoarthritis of knee 02/28/2020   Precordial chest pain 05/03/2019   Educated about COVID-19 virus infection 05/03/2019   Hoarseness of voice 03/17/2018   Gastroesophageal reflux disease without esophagitis 03/17/2018   Supervision of normal pregnancy, antepartum 12/15/2017   Acute medial meniscus tear of right knee 02/10/2017   Vitamin D deficiency 07/10/2015   Anxiety 06/20/2015   Costochondritis 05/18/2015   Primary osteoarthritis of right knee 04/22/2014   Essential hypertension 07/18/2012   LUMBOSACRAL STRAIN, ACUTE 07/05/2008   Morbid obesity ([place]) 02/24/2008   Asthma with exacerbation 02/24/2008   Past Medical History:  Diagnosis  Date   Asthma    2010-only bothers pt during bad cold   Depression    2010-had when mother passed away   History of gonorrhea    Hypertension    2004   Obesity    Pre-diabetes    Preeclampsia complicating hypertension 01/21/2015    Family History  Problem Relation Age of Onset   Breast cancer Mother    Hypertension Mother    Diabetes Mother    Cancer Mother        Breast    Heart attack Mother 47   Hypertension Father    Diabetes Father    Hyperlipidemia Father    Arthritis Other    Asthma Other    Breast cancer Other    Heart failure Other    Congenital heart disease Other    Depression Other    Heart attack Other     Past Surgical History:  Procedure Laterality Date   CHOLECYSTECTOMY     DILATION AND CURETTAGE OF UTERUS     WISDOM TOOTH EXTRACTION     Social History   Occupational History   Not on file  Tobacco Use   Smoking status: Former    Types: Cigarettes    Quit date: 10/31/2010    Years since quitting: 11.5   Smokeless tobacco: Never  Vaping Use   Vaping Use: Never used  Substance and Sexual Activity   Alcohol use: No    Alcohol/week: 0.0 standard drinks of alcohol   Drug use: No   Sexual activity: Not Currently    Partners: Male    Birth control/protection: Injection

## 2022-05-22 ENCOUNTER — Telehealth: Payer: Self-pay | Admitting: Orthopaedic Surgery

## 2022-05-22 ENCOUNTER — Telehealth: Payer: Self-pay | Admitting: Physician Assistant

## 2022-05-22 NOTE — Telephone Encounter (Signed)
yes

## 2022-05-22 NOTE — Telephone Encounter (Signed)
Patient called asked if she can get a note for her employer stating she was out of work yesterday and today. Patient said she could not go to work today either.   Patient asked if the note can be put in [place]. Patient said she is employed with [employer].    ATTNVirgilio Belling    The number to contact patient is [phone]

## 2022-05-22 NOTE — Telephone Encounter (Signed)
Sent note to MyChart and notified patient.

## 2022-05-22 NOTE — Telephone Encounter (Signed)
Patient still waiting on note

## 2022-05-23 ENCOUNTER — Telehealth: Payer: Self-pay

## 2022-05-23 ENCOUNTER — Other Ambulatory Visit: Payer: Self-pay

## 2022-05-23 NOTE — Telephone Encounter (Signed)
Patient outreached by Junius Finner, PharmD Candidate on 05/23/2022 to discuss hypertension.   Patient does not have an automated home blood pressure machine. She used to have one but it is currently broken.   Medication review was performed. They are not taking medications as prescribed. Differences from their prescribed list include: not taking atorvastatin, as she believes this was giving her headaches. Discussed speaking with her PCP to perhaps change to another statin.   The following barriers to adherence were noted:  - They do not have cost concerns.  - They do not have transportation concerns.  - They do not need assistance obtaining refills.  - They do not occasionally forget to take some of their prescribed medications.  - They do feel like one/some of their medications make them feel poorly.  - They do not have questions or concerns about their medications.  - They do not have follow up scheduled with their primary care provider/cardiologist.   The following interventions were completed:  - Medications were reviewed  - Patient was educated on medications, including indication and administration  - Patient was educated on the importance of cholesterol medications and why she would be indicated to take them  - Patient was educated on use of adherence strategies, like an alarm  - Patient was educated on how to access home blood pressure machine   Patient DOES NOT have a PCP appointment currently, she would like someone to reach out to schedule one.   Left phone number with patient for any questions or concerns: [phone].   [place] [facility]  PharmD Candidate [phone]   Reached out to front desk staff to schedule a follow up appointment.   Maryan Puls, PharmD PGY-1 [facility] Pharmacy Resident

## 2022-05-23 NOTE — Telephone Encounter (Signed)
Patient attempted to be outreached by Junius Finner, PharmD Candidate on 05/23/2022 to discuss hypertension. Left voicemail for patient to return our call at their convenience at [phone].   [place] [facility]  PharmD Candidate 2024   I attest that I have reviewed the pharmacy student note and verified the documentation and medical decision making.  Maryan Puls, PharmD PGY-1 [facility] Pharmacy Resident

## 2022-06-03 ENCOUNTER — Encounter (HOSPITAL_COMMUNITY): Payer: Self-pay | Admitting: *Deleted

## 2022-06-03 ENCOUNTER — Other Ambulatory Visit: Payer: Self-pay

## 2022-06-03 ENCOUNTER — Ambulatory Visit (HOSPITAL_COMMUNITY)
Admission: EM | Admit: 2022-06-03 | Discharge: 2022-06-03 | Disposition: A | Discharge status: Home / Self Care | Payer: Medicaid Other | Attending: Internal Medicine | Admitting: Internal Medicine

## 2022-06-03 DIAGNOSIS — J069 Acute upper respiratory infection, unspecified: Secondary | ICD-10-CM

## 2022-06-03 DIAGNOSIS — J4541 Moderate persistent asthma with (acute) exacerbation: Secondary | ICD-10-CM

## 2022-06-03 MED ORDER — BENZONATATE 100 MG PO CAPS: 100.0000 mg | ORAL_CAPSULE | Freq: Three times a day (TID) | ORAL | 0 refills | Status: DC

## 2022-06-03 MED ORDER — DEXAMETHASONE SODIUM PHOSPHATE 10 MG/ML IJ SOLN
INTRAMUSCULAR | Status: AC
  Filled 2022-06-03: qty 1

## 2022-06-03 MED ORDER — PROMETHAZINE-DM 6.25-15 MG/5ML PO SYRP: 5.0000 mL | ORAL_SOLUTION | Freq: Every evening | ORAL | 0 refills | Status: DC | PRN

## 2022-06-03 MED ORDER — IPRATROPIUM-ALBUTEROL 0.5-2.5 (3) MG/3ML IN SOLN
3.0000 mL | Freq: Once | RESPIRATORY_TRACT | Status: AC
  Administered 2022-06-03: 3 mL via RESPIRATORY_TRACT

## 2022-06-03 MED ORDER — DEXAMETHASONE SODIUM PHOSPHATE 10 MG/ML IJ SOLN
10.0000 mg | Freq: Once | INTRAMUSCULAR | Status: AC
Start: 2022-06-03 — End: 2022-06-03
  Administered 2022-06-03: 10 mg via INTRAMUSCULAR

## 2022-06-03 MED ORDER — PREDNISONE 20 MG PO TABS: 40.0000 mg | ORAL_TABLET | Freq: Every day | ORAL | 0 refills | Status: AC

## 2022-06-03 MED ORDER — ALBUTEROL SULFATE (2.5 MG/3ML) 0.083% IN NEBU: 2.5000 mg | INHALATION_SOLUTION | Freq: Four times a day (QID) | RESPIRATORY_TRACT | 12 refills | Status: DC | PRN

## 2022-06-03 MED ORDER — IPRATROPIUM-ALBUTEROL 0.5-2.5 (3) MG/3ML IN SOLN
RESPIRATORY_TRACT | Status: AC
  Filled 2022-06-03: qty 3

## 2022-06-03 NOTE — ED Triage Notes (Signed)
Pt report cough ,wheezing for one week . Pt has been using  meds at home. Pt reports she can not rest due to cough.

## 2022-06-03 NOTE — Discharge Instructions (Signed)
You were evaluated today for an asthma exacerbation likely triggered by a viral upper respiratory infection.   - Use albuterol every 4-6 hours as needed for cough, shortness of breath, and wheeze. - Take the steroid sent to the pharmacy as directed to help reduce lung inflammation and decrease the risk of another attack in the next few days. No NSAIDs like ibuprofen or aleve/naproxen while taking steroid. Take with food to avoid stomach upset. Start taking steroid pills tomorrow since I gave you the steroid shot in the office. - Take prescribed cough medicine as directed. - Continue taking mucinex to help break up mucous.  If your symptoms do not improve in the next 2-3 days with interventions, please return. Please seek medical care for new or returning symptoms, such as difficulty breathing that doesn't improve with your medications, chest pain, voice changes, high fevers, confusion, or other new or worsening symptoms. Follow-up with PCP for ongoing management of asthma. I hope you feel better!

## 2022-06-03 NOTE — ED Provider Notes (Addendum)
[place]    CSN: [id number] Arrival date & time: 06/03/22  1450      History   Chief Complaint Chief Complaint  Patient presents with   Cough   Wheezing   Sore Throat   Generalized Body Aches    HPI Crystal Burns is a 42 y.o. female.   Patient presents to urgent care for evaluation of cough, sore throat, generalized bodyaches, generalized headache, nasal congestion, and shortness of breath that started 1 week ago.  Her daughter is sick with similar symptoms.  Patient has a history of asthma and states she has experienced a lot of wheezing with chest tightness and coughing.  She has been using her albuterol nebulizer machine over the last couple of days and states that this has been helping only slightly with her chest congestion and wheezing.  She has not had a recent asthma exacerbation and denies recent antibiotic or steroid use.  Asthma is usually well-controlled with as needed use of albuterol inhaler.  She is a former smoker, quit in 2012.  Denies drug use.  Cough is productive with clear/yellow phlegm.  She is no longer experiencing any fever or chills but has been using over-the-counter cough and cold medication without any relief of symptoms.  Tolerating food and fluids well without nausea, vomiting, diarrhea, or abdominal pain.  No urinary symptoms reported or dizziness/decreased appetite.  Denies orthopnea and leg swelling.   Cough Associated symptoms: wheezing   Wheezing Associated symptoms: cough   Sore Throat    Past Medical History:  Diagnosis Date   Asthma    2010-only bothers pt during bad cold   Depression    2010-had when mother passed away   History of gonorrhea    Hypertension    2004   Obesity    Pre-diabetes    Preeclampsia complicating hypertension 01/21/2015    Patient Active Problem List   Diagnosis Date Noted   Bilateral primary osteoarthritis of knee 02/28/2020   Precordial chest pain 05/03/2019   Educated about  COVID-19 virus infection 05/03/2019   Hoarseness of voice 03/17/2018   Gastroesophageal reflux disease without esophagitis 03/17/2018   Supervision of normal pregnancy, antepartum 12/15/2017   Acute medial meniscus tear of right knee 02/10/2017   Vitamin D deficiency 07/10/2015   Anxiety 06/20/2015   Costochondritis 05/18/2015   Primary osteoarthritis of right knee 04/22/2014   Essential hypertension 07/18/2012   LUMBOSACRAL STRAIN, ACUTE 07/05/2008   Morbid obesity ([place]) 02/24/2008   Asthma with exacerbation 02/24/2008    Past Surgical History:  Procedure Laterality Date   CHOLECYSTECTOMY     DILATION AND CURETTAGE OF UTERUS     WISDOM TOOTH EXTRACTION      OB History     Gravida  11   Para  7   Term  7   Preterm  0   AB  4   Living  6      SAB  4   IAB  0   Ectopic  0   Multiple  0   Live Births  7            Home Medications    Prior to Admission medications   Medication Sig Start Date End Date Taking? Authorizing Provider  albuterol (PROVENTIL) (2.5 MG/3ML) 0.083% nebulizer solution Take 3 mLs (2.5 mg total) by nebulization every 6 (six) hours as needed for wheezing or shortness of breath. 06/03/22  Yes Talbot Grumbling, FNP  benzonatate (TESSALON) 100 MG capsule  Take 1 capsule (100 mg total) by mouth every 8 (eight) hours. 06/03/22  Yes Talbot Grumbling, FNP  predniSONE (DELTASONE) 20 MG tablet Take 2 tablets (40 mg total) by mouth daily for 5 days. 06/03/22 06/08/22 Yes Talbot Grumbling, FNP  promethazine-dextromethorphan (PROMETHAZINE-DM) 6.25-15 MG/5ML syrup Take 5 mLs by mouth at bedtime as needed for cough. 06/03/22  Yes Talbot Grumbling, FNP  albuterol (VENTOLIN HFA) 108 (90 Base) MCG/ACT inhaler Inhale 2 puffs into the lungs every 6 (six) hours as needed for wheezing or shortness of breath (Cough). 08/13/21   Lynden Oxford Scales, PA-C  atorvastatin (LIPITOR) 20 MG tablet Take 1 tablet (20 mg total) by mouth daily. 10/31/21    Charlott Rakes, MD  cetirizine (ZYRTEC ALLERGY) 10 MG tablet Take 1 tablet (10 mg total) by mouth at bedtime. 01/17/22 07/16/22  Raspet, Derry Skill, PA-C  diclofenac (VOLTAREN) 75 MG EC tablet Take 1 tablet (75 mg total) by mouth 2 (two) times daily as needed. 05/21/22   Aundra Dubin, PA-C  fluticasone (FLONASE) 50 MCG/ACT nasal spray Place 1 spray into both nostrils daily. 01/17/22   Raspet, Derry Skill, PA-C  hydrochlorothiazide (HYDRODIURIL) 25 MG tablet Take 1 tablet (25 mg total) by mouth daily. 04/25/22   Charlott Rakes, MD  Iron, Ferrous Sulfate, 325 (65 Fe) MG TABS Take 1 tablet by mouth daily. 10/31/21   Charlott Rakes, MD  losartan (COZAAR) 50 MG tablet Take 1 tablet (50 mg total) by mouth daily. 04/25/22 04/25/23  Charlott Rakes, MD  metFORMIN (GLUCOPHAGE) 500 MG tablet Take 2 tablets (1,000 mg total) by mouth 2 (two) times daily with a meal. 01/04/22     traMADol (ULTRAM) 50 MG tablet Take 1 tablet (50 mg total) by mouth 3 (three) times daily as needed. 05/21/22   Aundra Dubin, PA-C    Family History Family History  Problem Relation Age of Onset   Breast cancer Mother    Hypertension Mother    Diabetes Mother    Cancer Mother        Breast    Heart attack Mother 61   Hypertension Father    Diabetes Father    Hyperlipidemia Father    Arthritis Other    Asthma Other    Breast cancer Other    Heart failure Other    Congenital heart disease Other    Depression Other    Heart attack Other     Social History Social History   Tobacco Use   Smoking status: Former    Types: Cigarettes    Quit date: 10/31/2010    Years since quitting: 11.5   Smokeless tobacco: Never  Vaping Use   Vaping Use: Never used  Substance Use Topics   Alcohol use: No    Alcohol/week: 0.0 standard drinks of alcohol   Drug use: No     Allergies   Bee venom, Lisinopril, and Shellfish allergy   Review of Systems Review of Systems  Respiratory:  Positive for cough and wheezing.   Per  HPI   Physical Exam Triage Vital Signs ED Triage Vitals  Enc Vitals Group     BP 06/03/22 1724 128/86     Pulse Rate 06/03/22 1724 75     Resp 06/03/22 1724 20     Temp 06/03/22 1724 98.6 F (37 C)     Temp src --      SpO2 06/03/22 1724 95 %     Weight --      Height --  Head Circumference --      Peak Flow --      Pain Score 06/03/22 1723 9     Pain Loc --      Pain Edu? --      Excl. in [place]? --    No data found.  Updated Vital Signs BP 128/86   Pulse 75   Temp 98.6 F (37 C)   Resp 20   SpO2 95%   Visual Acuity Right Eye Distance:   Left Eye Distance:   Bilateral Distance:    Right Eye Near:   Left Eye Near:    Bilateral Near:     Physical Exam Vitals and nursing note reviewed.  Constitutional:      Appearance: She is obese. She is ill-appearing. She is not toxic-appearing.  HENT:     Head: Normocephalic and atraumatic.     Right Ear: Hearing, tympanic membrane, ear canal and external ear normal.     Left Ear: Hearing, tympanic membrane, ear canal and external ear normal.     Nose: Congestion present.     Mouth/Throat:     Lips: Pink.     Mouth: Mucous membranes are moist. No injury.     Tongue: No lesions. Tongue does not deviate from midline.     Palate: No mass and lesions.     Pharynx: Oropharynx is clear. Uvula midline. No pharyngeal swelling, oropharyngeal exudate, posterior oropharyngeal erythema or uvula swelling.     Tonsils: No tonsillar exudate or tonsillar abscesses.  Eyes:     General: Lids are normal. Vision grossly intact. Gaze aligned appropriately.        Right eye: No discharge.        Left eye: No discharge.     Extraocular Movements: Extraocular movements intact.     Conjunctiva/sclera: Conjunctivae normal.  Cardiovascular:     Rate and Rhythm: Normal rate and regular rhythm.     Heart sounds: Normal heart sounds, S1 normal and S2 normal.  Pulmonary:     Effort: Pulmonary effort is normal. No respiratory distress.      Breath sounds: Normal air entry. Wheezing present. No decreased breath sounds, rhonchi or rales.     Comments: Diffuse inspiratory and expiratory wheezing present to all lung fields.  Speaking full sentences without difficulty or increased respiratory effort. Musculoskeletal:     Cervical back: Neck supple.     Right lower leg: No edema.     Left lower leg: No edema.  Lymphadenopathy:     Cervical: No cervical adenopathy.  Skin:    General: Skin is warm and dry.     Capillary Refill: Capillary refill takes less than 2 seconds.     Findings: No rash.  Neurological:     General: No focal deficit present.     Mental Status: She is alert and oriented to person, place, and time. Mental status is at baseline.     Cranial Nerves: No dysarthria or facial asymmetry.  Psychiatric:        Mood and Affect: Mood normal.        Speech: Speech normal.        Behavior: Behavior normal.        Thought Content: Thought content normal.        Judgment: Judgment normal.      UC Treatments / Results  Labs (all labs ordered are listed, but only abnormal results are displayed) Labs Reviewed - No data to display  EKG  Radiology No results found.  Procedures Procedures (including critical care time)  Medications Ordered in UC Medications  dexamethasone (DECADRON) injection 10 mg (10 mg Intramuscular Given 06/03/22 1820)  ipratropium-albuterol (DUONEB) 0.5-2.5 (3) MG/3ML nebulizer solution 3 mL (3 mLs Nebulization Given 06/03/22 1821)    Initial Impression / Assessment and Plan / UC Course  I have reviewed the triage vital signs and the nursing notes.  Pertinent labs & imaging results that were available during my care of the patient were reviewed by me and considered in my medical decision making (see chart for details).   1.  Moderate persistent asthma with acute exacerbation triggered by viral URI with cough Presentation is consistent with acute viral upper respiratory tract infection  triggering moderate asthma exacerbation.  Wheezing heard to auscultation improved significantly after DuoNeb treatment, however still persistent and present.  Patient reports significant subjective improvement in shortness of breath after nebulizer treatment. Dexamethasone '10mg'$  IM given to reduce inflammation and shortness of breath. Steroid burst sent to pharmacy to be started tomorrow, advised no NSAIDs while taking steroid. Steroid with food to avoid stomach upset. Tessalon perles and promethazine DM sent for as needed cough relief, sinus precautions discussed regarding Promethazine DM prescription. Deferred testing due to timing of illness.  PCP follow-up recommended in the next 1-2 weeks to ensure improving symptoms and to discuss plan to prevent further asthma exacerbations. Deferred imaging due to stable cardiopulmonary exam and improvement in clinic with duoneb.   Discussed physical exam and available lab work findings in clinic with patient.  Counseled patient regarding appropriate use of medications and potential side effects for all medications recommended or prescribed today. Discussed red flag signs and symptoms of worsening condition,when to call the PCP office, return to urgent care, and when to seek higher level of care in the emergency department. Patient verbalizes understanding and agreement with plan. All questions answered. Patient discharged in stable condition.    Final Clinical Impressions(s) / UC Diagnoses   Final diagnoses:  Moderate persistent asthma with exacerbation  Viral URI with cough     Discharge Instructions      You were evaluated today for an asthma exacerbation likely triggered by a viral upper respiratory infection.   - Use albuterol every 4-6 hours as needed for cough, shortness of breath, and wheeze. - Take the steroid sent to the pharmacy as directed to help reduce lung inflammation and decrease the risk of another attack in the next few days. No NSAIDs  like ibuprofen or aleve/naproxen while taking steroid. Take with food to avoid stomach upset. Start taking steroid pills tomorrow since I gave you the steroid shot in the office. - Take prescribed cough medicine as directed. - Continue taking mucinex to help break up mucous.  If your symptoms do not improve in the next 2-3 days with interventions, please return. Please seek medical care for new or returning symptoms, such as difficulty breathing that doesn't improve with your medications, chest pain, voice changes, high fevers, confusion, or other new or worsening symptoms. Follow-up with PCP for ongoing management of asthma. I hope you feel better!       ED Prescriptions     Medication Sig Dispense Auth. Provider   predniSONE (DELTASONE) 20 MG tablet Take 2 tablets (40 mg total) by mouth daily for 5 days. 10 tablet Joella Prince M, FNP   benzonatate (TESSALON) 100 MG capsule Take 1 capsule (100 mg total) by mouth every 8 (eight) hours. 21 capsule Talbot Grumbling, FNP  promethazine-dextromethorphan (PROMETHAZINE-DM) 6.25-15 MG/5ML syrup Take 5 mLs by mouth at bedtime as needed for cough. 118 mL Joella Prince M, FNP   albuterol (PROVENTIL) (2.5 MG/3ML) 0.083% nebulizer solution Take 3 mLs (2.5 mg total) by nebulization every 6 (six) hours as needed for wheezing or shortness of breath. 75 mL Talbot Grumbling, FNP      PDMP not reviewed this encounter.   Talbot Grumbling, [place] 06/03/22 [place], [place], FNP 06/03/22 [phone]

## 2022-06-25 ENCOUNTER — Other Ambulatory Visit: Payer: Self-pay

## 2022-06-26 ENCOUNTER — Ambulatory Visit: Payer: Medicaid Other | Admitting: Family Medicine

## 2022-07-24 ENCOUNTER — Ambulatory Visit: Payer: Medicaid Other | Admitting: Physician Assistant

## 2022-07-24 ENCOUNTER — Other Ambulatory Visit (HOSPITAL_COMMUNITY)
Admission: RE | Admit: 2022-07-24 | Discharge: 2022-07-24 | Disposition: A | Discharge status: Home / Self Care | Payer: Medicaid Other | Source: Ambulatory Visit | Attending: Physician Assistant | Admitting: Physician Assistant

## 2022-07-24 ENCOUNTER — Other Ambulatory Visit: Payer: Self-pay

## 2022-07-24 DIAGNOSIS — Z202 Contact with and (suspected) exposure to infections with a predominantly sexual mode of transmission: Secondary | ICD-10-CM

## 2022-07-24 NOTE — Progress Notes (Signed)
Pt wanted STD screening from recent possible exposure.

## 2022-07-26 ENCOUNTER — Other Ambulatory Visit: Payer: Self-pay

## 2022-07-26 LAB — CERVICOVAGINAL ANCILLARY ONLY
Bacterial Vaginitis (gardnerella): NEGATIVE
Candida Glabrata: NEGATIVE
Candida Vaginitis: NEGATIVE
Chlamydia: NEGATIVE
Comment: NEGATIVE
Comment: NEGATIVE
Comment: NEGATIVE
Comment: NEGATIVE
Comment: NORMAL
Neisseria Gonorrhea: NEGATIVE

## 2022-07-30 ENCOUNTER — Other Ambulatory Visit: Payer: Self-pay

## 2022-08-03 ENCOUNTER — Telehealth: Payer: Self-pay | Admitting: Neurology

## 2022-08-03 NOTE — Telephone Encounter (Signed)
Hi Dr. Alvis Lemmings: I see that you are the primary care for this patient.  She actually accompanied her daughter to an appointment with me the other day, she is not my patient.  However when I walked in Crystal Burns was asleep.  I mentioned it to her because it was odd she was asleep when I walked in and mentioned sleep apnea given her body habitus and the symptoms she prescribed to me, she gave me permission to reach out to you about a sleep study as clinically warranted by you, Crystal Burns reported being tired, snoring, waking up frequently, morning headaches, excessive fatigue.  She is not my patient although she asked me to send this to you and see if you could, at your clinical judgment, addressed this possibly with a sleep study or see patient.  But of course since you are her primary care this is completely at your discretion.  Thanks Dr. Daisy Blossom.

## 2022-08-05 ENCOUNTER — Telehealth: Payer: Self-pay | Admitting: Family Medicine

## 2022-08-05 NOTE — Telephone Encounter (Signed)
Thank you for reaching out.  We will give her a call to schedule an appointment for evaluation.

## 2022-08-05 NOTE — Telephone Encounter (Signed)
Pt has been given an appointment for 08/08/2022

## 2022-08-05 NOTE — Telephone Encounter (Signed)
Can you please schedule an office visit for sleep apnea evaluation?  Thank you

## 2022-08-08 ENCOUNTER — Ambulatory Visit: Payer: Medicaid Other | Admitting: Family Medicine

## 2022-08-26 ENCOUNTER — Other Ambulatory Visit: Payer: Self-pay

## 2022-08-26 ENCOUNTER — Other Ambulatory Visit: Payer: Self-pay | Admitting: Family Medicine

## 2022-08-26 DIAGNOSIS — I10 Essential (primary) hypertension: Secondary | ICD-10-CM

## 2022-08-27 ENCOUNTER — Other Ambulatory Visit: Payer: Self-pay

## 2022-08-27 ENCOUNTER — Encounter: Payer: Self-pay | Admitting: Pharmacist

## 2022-09-17 ENCOUNTER — Other Ambulatory Visit: Payer: Self-pay | Admitting: Family Medicine

## 2022-09-17 DIAGNOSIS — I10 Essential (primary) hypertension: Secondary | ICD-10-CM

## 2022-09-17 NOTE — Telephone Encounter (Signed)
Medication Refill - Medication: hydrochlorothiazide (HYDRODIURIL) 25 MG tablet  Has the patient contacted their pharmacy? Yes.   Pt told to contact provider  Preferred Pharmacy (with phone number or street name): [facility] Phone: [phone]  Fax: [phone]   Has the patient been seen for an appointment in the last year OR does the patient have an upcoming appointment? Yes.    Agent: Please be advised that RX refills may take up to 3 business days. We ask that you follow-up with your pharmacy.

## 2022-09-18 ENCOUNTER — Other Ambulatory Visit: Payer: Self-pay

## 2022-09-18 MED ORDER — HYDROCHLOROTHIAZIDE 25 MG PO TABS
25.0000 mg | ORAL_TABLET | Freq: Every day | ORAL | 0 refills | Status: DC
  Filled 2022-09-18: qty 90, 90d supply, fill #0

## 2022-09-18 NOTE — Telephone Encounter (Signed)
Requested medication (s) are due for refill today: yes  Requested medication (s) are on the active medication list: yes  Last refill:  04/25/22  #90  Future visit scheduled: yes  Notes to clinic:  Overdue labs/has upcoming labs    Requested Prescriptions  Pending Prescriptions Disp Refills   hydrochlorothiazide (HYDRODIURIL) 25 MG tablet 90 tablet 0    Sig: Take 1 tablet (25 mg total) by mouth daily.     Cardiovascular: Diuretics - Thiazide Failed - 09/17/2022  1:37 PM      Failed - Cr in normal range and within 180 days    Creat  Date Value Ref Range Status  04/22/2014 0.59 0.50 - 1.10 mg/dL Final   Creatinine, Ser  Date Value Ref Range Status  10/30/2021 0.66 0.57 - 1.00 mg/dL Final   Creatinine, Urine  Date Value Ref Range Status  02/06/2015 41.00 mg/dL Final         Failed - K in normal range and within 180 days    Potassium  Date Value Ref Range Status  10/30/2021 4.0 3.5 - 5.2 mmol/L Final         Failed - Na in normal range and within 180 days    Sodium  Date Value Ref Range Status  10/30/2021 140 134 - 144 mmol/L Final         Failed - Valid encounter within last 6 months    Recent Outpatient Visits           10 months ago Annual physical exam   [facility] [facility] Hoy Register, MD   2 years ago Prediabetes   [facility] Hoy Register, MD   2 years ago Right ear pain   [facility] Cain Saupe, MD   3 years ago Upper respiratory tract infection, unspecified type   [facility], [place], [place]   3 years ago No-show for appointment   [facility] Cain Saupe, MD       Future Appointments             In 2 months Claiborne Rigg, NP [facility]            Passed - Last BP in normal range    BP Readings from Last 1 Encounters:   06/03/22 128/86

## 2022-09-19 ENCOUNTER — Other Ambulatory Visit: Payer: Self-pay

## 2022-09-26 ENCOUNTER — Other Ambulatory Visit: Payer: Self-pay

## 2022-10-25 ENCOUNTER — Other Ambulatory Visit: Payer: Self-pay

## 2022-11-01 ENCOUNTER — Other Ambulatory Visit: Payer: Self-pay

## 2022-11-11 ENCOUNTER — Other Ambulatory Visit: Payer: Self-pay

## 2022-11-19 ENCOUNTER — Other Ambulatory Visit: Payer: Self-pay

## 2022-11-19 ENCOUNTER — Ambulatory Visit (HOSPITAL_COMMUNITY)
Admission: EM | Admit: 2022-11-19 | Discharge: 2022-11-19 | Disposition: A | Discharge status: Home / Self Care | Payer: Medicaid Other | Attending: Emergency Medicine | Admitting: Emergency Medicine

## 2022-11-19 ENCOUNTER — Encounter (HOSPITAL_COMMUNITY): Payer: Self-pay | Admitting: Emergency Medicine

## 2022-11-19 DIAGNOSIS — J069 Acute upper respiratory infection, unspecified: Secondary | ICD-10-CM | POA: Diagnosis not present

## 2022-11-19 DIAGNOSIS — H66004 Acute suppurative otitis media without spontaneous rupture of ear drum, recurrent, right ear: Secondary | ICD-10-CM | POA: Diagnosis not present

## 2022-11-19 DIAGNOSIS — J4521 Mild intermittent asthma with (acute) exacerbation: Secondary | ICD-10-CM | POA: Insufficient documentation

## 2022-11-19 DIAGNOSIS — Z20822 Contact with and (suspected) exposure to covid-19: Secondary | ICD-10-CM | POA: Insufficient documentation

## 2022-11-19 MED ORDER — AEROCHAMBER MV MISC
1 refills | Status: AC
  Filled 2022-11-19: qty 1, 30d supply, fill #0

## 2022-11-19 MED ORDER — PROMETHAZINE-DM 6.25-15 MG/5ML PO SYRP
5.0000 mL | ORAL_SOLUTION | Freq: Four times a day (QID) | ORAL | 0 refills | Status: DC | PRN
  Filled 2022-11-19: qty 118, 6d supply, fill #0

## 2022-11-19 MED ORDER — ALBUTEROL SULFATE HFA 108 (90 BASE) MCG/ACT IN AERS
2.0000 | INHALATION_SPRAY | RESPIRATORY_TRACT | 0 refills | Status: AC | PRN
  Filled 2022-11-19: qty 18, 17d supply, fill #0

## 2022-11-19 MED ORDER — AMOXICILLIN-POT CLAVULANATE 875-125 MG PO TABS
1.0000 | ORAL_TABLET | Freq: Two times a day (BID) | ORAL | 0 refills | Status: AC
  Filled 2022-11-19: qty 14, 7d supply, fill #0

## 2022-11-19 MED ORDER — NAPROXEN 500 MG PO TABS
500.0000 mg | ORAL_TABLET | Freq: Two times a day (BID) | ORAL | 0 refills | Status: DC
  Filled 2022-11-19: qty 20, 10d supply, fill #0

## 2022-11-19 MED ORDER — PREDNISONE 20 MG PO TABS
40.0000 mg | ORAL_TABLET | Freq: Every day | ORAL | 0 refills | Status: AC
  Filled 2022-11-19: qty 10, 5d supply, fill #0

## 2022-11-19 NOTE — ED Triage Notes (Signed)
Pt c/o sore throat, nasal and chest congestion for the past 2 days with ear pain not getting better with over the counter medication.

## 2022-11-19 NOTE — Discharge Instructions (Signed)
2 puffs from your albuterol inhaler using your spacer or an albuterol nebulizer treatment every 4 hours for 2 days, then every 6 hours for 2 days, then as needed.  You can back off the albuterol if you start to improve sooner.  Continue Flonase, Mucinex or Mucinex D.  Mucinex D may raise your blood pressure.  You can take it if your blood pressure stays normal.  Finish the Augmentin for an ear infection, even if you feel better.  Prednisone will help with an asthma exacerbation.  Promethazine DM for cough.  We will contact you if your COVID comes back positive and I will prescribe Paxlovid.  If your COVID is positive and I prescribed Paxlovid, you will need to stop the Flonase.  We will try something different for your nose.

## 2022-11-19 NOTE — ED Provider Notes (Signed)
HPI  SUBJECTIVE:  Crystal Burns is a 42 y.o. female who presents with 3 days of nasal congestion, rhinorrhea, bilateral ear pain worse on the right, decreased hearing in her right ear, chest soreness secondary to  cough and a cough productive of green-yellow sputum.  She reports headache, sinus pain and pressure, sore throat, postnasal drip, wheezing, shortness of breath.  She reports diarrhea 2 days ago, this has resolved.  No fevers, body aches, facial swelling, upper dental pain, nausea, vomiting, abdominal pain.  No known COVID exposure.  She got 2 doses of the COVID-vaccine.  She is unable to sleep at night because of the cough.  No antibiotics in the past month.  She took an antipyretic within 6 hours of evaluation.  She has tried Alka-Seltzer cold, Mucinex, DayQuil, NyQuil, tea, Flonase her, and is needing her albuterol inhaler 3 times daily.  She normally uses it as needed.  She states that Mucinex is loosening up the chest congestion.  No aggravating factors.  She has a BMI above 30, asthma, COVID x 3, hypertension,  diabetes.  No history of chronic kidney disease.  LMP: 7 8 months ago.  She is on Depo.  She denies the possibility of being pregnant.  PCP: [facility].    Past Medical History:  Diagnosis Date   Asthma    2010-only bothers pt during bad cold   Depression    2010-had when mother passed away   History of gonorrhea    Hypertension    2004   Obesity    Pre-diabetes    Preeclampsia complicating hypertension 01/21/2015    Past Surgical History:  Procedure Laterality Date   CHOLECYSTECTOMY     DILATION AND CURETTAGE OF UTERUS     WISDOM TOOTH EXTRACTION      Family History  Problem Relation Age of Onset   Breast cancer Mother    Hypertension Mother    Diabetes Mother    Cancer Mother        Breast    Heart attack Mother 27   Hypertension Father    Diabetes Father    Hyperlipidemia Father    Arthritis Other    Asthma Other    Breast  cancer Other    Heart failure Other    Congenital heart disease Other    Depression Other    Heart attack Other     Social History   Tobacco Use   Smoking status: Former    Current packs/day: 0.00    Types: Cigarettes    Quit date: 10/31/2010    Years since quitting: 12.0   Smokeless tobacco: Never  Vaping Use   Vaping status: Never Used  Substance Use Topics   Alcohol use: No    Alcohol/week: 0.0 standard drinks of alcohol   Drug use: No    No current facility-administered medications for this encounter.  Current Outpatient Medications:    amoxicillin-clavulanate (AUGMENTIN) 875-125 MG tablet, Take 1 tablet by mouth every 12 (twelve) hours for 7 days., Disp: 14 tablet, Rfl: 0   naproxen (NAPROSYN) 500 MG tablet, Take 1 tablet (500 mg total) by mouth 2 (two) times daily., Disp: 20 tablet, Rfl: 0   predniSONE (DELTASONE) 20 MG tablet, Take 2 tablets (40 mg total) by mouth daily with breakfast for 5 days., Disp: 10 tablet, Rfl: 0   promethazine-dextromethorphan (PROMETHAZINE-DM) 6.25-15 MG/5ML syrup, Take 5 mLs by mouth 4 (four) times daily as needed for cough., Disp: 118 mL, Rfl: 0  Spacer/Aero-Holding Chambers (AEROCHAMBER MV) inhaler, Use as instructed, Disp: 1 each, Rfl: 1   albuterol (PROVENTIL) (2.5 MG/3ML) 0.083% nebulizer solution, Take 3 mLs (2.5 mg total) by nebulization every 6 (six) hours as needed for wheezing or shortness of breath., Disp: 75 mL, Rfl: 12   albuterol (VENTOLIN HFA) 108 (90 Base) MCG/ACT inhaler, Inhale 2 puffs into the lungs every 4 (four) hours as needed for wheezing or shortness of breath (Cough)., Disp: 18 g, Rfl: 0   atorvastatin (LIPITOR) 20 MG tablet, Take 1 tablet (20 mg total) by mouth daily., Disp: 30 tablet, Rfl: 3   fluticasone (FLONASE) 50 MCG/ACT nasal spray, Place 1 spray into both nostrils daily., Disp: 16 g, Rfl: 0   hydrochlorothiazide (HYDRODIURIL) 25 MG tablet, Take 1 tablet (25 mg total) by mouth daily., Disp: 90 tablet, Rfl: 0    Iron, Ferrous Sulfate, 325 (65 Fe) MG TABS, Take 1 tablet by mouth daily., Disp: 60 tablet, Rfl: 3   losartan (COZAAR) 50 MG tablet, Take 1 tablet (50 mg total) by mouth daily., Disp: 30 tablet, Rfl: 11   metFORMIN (GLUCOPHAGE) 500 MG tablet, Take 2 tablets (1,000 mg total) by mouth 2 (two) times daily with a meal., Disp: 120 tablet, Rfl: 6   traMADol (ULTRAM) 50 MG tablet, Take 1 tablet (50 mg total) by mouth 3 (three) times daily as needed., Disp: 30 tablet, Rfl: 2  Allergies  Allergen Reactions   Bee Venom Hives and Swelling   Lisinopril Shortness Of Breath and Cough   Shellfish Allergy Anaphylaxis     ROS  As noted in HPI.   Physical Exam  BP 130/86 (BP Location: Right Arm)   Pulse 73   Temp 98.4 F (36.9 C) (Oral)   Resp 20   Ht 5\' 3"  (1.6 m)   Wt (!) 144 kg   SpO2 98%   BMI 56.24 kg/m   Constitutional: Well developed, well nourished, no acute distress Eyes:  EOMI, conjunctiva normal bilaterally HENT: Normocephalic, atraumatic,mucus membranes moist.  Right TM dull, erythematous, bulging.  Decreased hearing right ear compared to left.  Left TM normal.  Positive purulent nasal congestion.  Erythematous, swollen turbinates.  No maxillary, frontal sinus tenderness.  Normal oropharynx, normal tonsils without exudates.  Uvula midline.  Positive postnasal drip. Neck: Positive cervical lymphadenopathy Respiratory: Normal inspiratory effort, lungs clear bilaterally.  Positive lateral chest wall tenderness Cardiovascular: Normal rate, regular rhythm, no murmurs rubs or gallops GI: nondistended skin: No rash, skin intact Musculoskeletal: no deformities Neurologic: Alert & oriented x 3, no focal neuro deficits Psychiatric: Speech and behavior appropriate   ED Course   Medications - No data to display  Orders Placed This Encounter  Procedures   SARS CORONAVIRUS 2 (TAT 6-24 HRS) Anterior Nasal Swab    Standing Status:   Standing    Number of Occurrences:   1    No  results found for this or any previous visit (from the past 24 hour(s)). No results found.  ED Clinical Impression  1. Upper respiratory tract infection, unspecified type   2. Recurrent acute suppurative otitis media of right ear without spontaneous rupture of tympanic membrane   3. Mild intermittent asthma with acute exacerbation   4. Encounter for laboratory testing for COVID-19 virus      ED Assessment/Plan     Patient presents with an upper respiratory infection causing a right-sided otitis media and asthma exacerbation.  Checking COVID.  She is a candidate for Paxlovid due to history of asthma  and BMI above 30.  Will also check a BMP as there is no GFR in the past 6 months.  Sending home with Augmentin for 7 days for the otitis media.  She is to continue Flonase, Mucinex D if her blood pressure stays normal, Promethazine DM, regularly scheduled albuterol inhaler with a spacer or an albuterol neb for the next 4 days, then as needed thereafter.  Naprosyn/Tylenol, saline nasal irrigation to prevent a bacterial sinus infection.  Will also send home on prednisone 40 mg for 5 days as I suspect that she is having an asthma exacerbation.  Work note for 3 days.  Deferring chest x-ray in the absence of tachypnea, hypoxia or focal lung findings.  BMP was discontinued for an unknown reason.  If COVID is positive, have patient return to have BMP drawn stat so that we can prescribe Paxlovid.  Discussed labs,  MDM, treatment plan, and plan for follow-up with patient. Discussed sn/sx that should prompt return to the ED. patient agrees with plan.   Meds ordered this encounter  Medications   albuterol (VENTOLIN HFA) 108 (90 Base) MCG/ACT inhaler    Sig: Inhale 2 puffs into the lungs every 4 (four) hours as needed for wheezing or shortness of breath (Cough).    Dispense:  18 g    Refill:  0   Spacer/Aero-Holding Chambers (AEROCHAMBER MV) inhaler    Sig: Use as instructed    Dispense:  1 each     Refill:  1   predniSONE (DELTASONE) 20 MG tablet    Sig: Take 2 tablets (40 mg total) by mouth daily with breakfast for 5 days.    Dispense:  10 tablet    Refill:  0   promethazine-dextromethorphan (PROMETHAZINE-DM) 6.25-15 MG/5ML syrup    Sig: Take 5 mLs by mouth 4 (four) times daily as needed for cough.    Dispense:  118 mL    Refill:  0   naproxen (NAPROSYN) 500 MG tablet    Sig: Take 1 tablet (500 mg total) by mouth 2 (two) times daily.    Dispense:  20 tablet    Refill:  0   amoxicillin-clavulanate (AUGMENTIN) 875-125 MG tablet    Sig: Take 1 tablet by mouth every 12 (twelve) hours for 7 days.    Dispense:  14 tablet    Refill:  0      *This clinic note was created using Scientist, clinical (histocompatibility and immunogenetics). Therefore, there may be occasional mistakes despite careful proofreading.  ?    Domenick Gong, MD 11/19/22 2050

## 2022-11-20 ENCOUNTER — Ambulatory Visit (HOSPITAL_COMMUNITY)
Admission: EM | Admit: 2022-11-20 | Discharge: 2022-11-20 | Disposition: A | Discharge status: Home / Self Care | Payer: Medicaid Other | Attending: Internal Medicine | Admitting: Internal Medicine

## 2022-11-20 ENCOUNTER — Telehealth (HOSPITAL_COMMUNITY): Payer: Self-pay | Admitting: Emergency Medicine

## 2022-11-20 ENCOUNTER — Other Ambulatory Visit: Payer: Self-pay

## 2022-11-20 DIAGNOSIS — U071 COVID-19: Secondary | ICD-10-CM | POA: Diagnosis not present

## 2022-11-20 LAB — BASIC METABOLIC PANEL
Anion gap: 11 (ref 5–15)
BUN: 10 mg/dL (ref 6–20)
CO2: 27 mmol/L (ref 22–32)
Calcium: 9.5 mg/dL (ref 8.9–10.3)
Chloride: 100 mmol/L (ref 98–111)
Creatinine, Ser: 0.71 mg/dL (ref 0.44–1.00)
GFR, Estimated: >60 mL/min (ref >60)
Glucose, Bld: 78 mg/dL (ref 70–99)
Potassium: 4.5 mmol/L (ref 3.5–5.1)
Sodium: 138 mmol/L (ref 135–145)

## 2022-11-20 MED ORDER — NIRMATRELVIR/RITONAVIR (PAXLOVID)TABLET
3.0000 | ORAL_TABLET | Freq: Two times a day (BID) | ORAL | 0 refills | Status: AC
  Filled 2022-11-20: qty 30, 5d supply, fill #0

## 2022-11-20 NOTE — Telephone Encounter (Signed)
GFR >60, Paxlovid sent for COVID Reviewed with patient, verified pharmacy, prescription sent

## 2022-11-20 NOTE — ED Triage Notes (Signed)
Lab from yesterday (BMP) was cancelled, unknown reason.  Safety zone submitted, but lab had blood on site, submitting BMP order again

## 2022-11-22 ENCOUNTER — Ambulatory Visit: Payer: Medicaid Other | Admitting: Nurse Practitioner

## 2022-11-22 ENCOUNTER — Other Ambulatory Visit: Payer: Self-pay

## 2022-12-24 ENCOUNTER — Other Ambulatory Visit: Payer: Self-pay | Admitting: Family Medicine

## 2022-12-24 ENCOUNTER — Other Ambulatory Visit: Payer: Self-pay

## 2022-12-24 DIAGNOSIS — I10 Essential (primary) hypertension: Secondary | ICD-10-CM

## 2022-12-25 ENCOUNTER — Other Ambulatory Visit: Payer: Self-pay

## 2022-12-25 NOTE — Telephone Encounter (Signed)
Requested medication (s) are due for refill today: Yes  Requested medication (s) are on the active medication list: Yes  Last refill:  09/18/22 #90, 0RF  Future visit scheduled: No  Notes to clinic:  Unable to refill per protocol, appointment needed.      Requested Prescriptions  Pending Prescriptions Disp Refills   hydrochlorothiazide (HYDRODIURIL) 25 MG tablet 90 tablet 0    Sig: Take 1 tablet (25 mg total) by mouth daily.     Cardiovascular: Diuretics - Thiazide Failed - 12/24/2022 12:31 PM      Failed - Valid encounter within last 6 months    Recent Outpatient Visits           1 year ago Annual physical exam   [facility] [place], [place], [place]   2 years ago Prediabetes   [facility] [place], Odette [place], MD   3 years ago Right ear pain   [facility] [place], [place], [place]   3 years ago Upper respiratory tract infection, unspecified type   [facility] [place], [place], [place]   3 years ago No-show for appointment   [facility] [place], [place], [place]              Passed - Cr in normal range and within 180 days    Creat  Date Value Ref Range Status  04/22/2014 0.59 0.50 - 1.10 mg/dL Final   Creatinine, Ser  Date Value Ref Range Status  11/19/2022 0.71 0.44 - 1.00 mg/dL Final   Creatinine, Urine  Date Value Ref Range Status  02/06/2015 41.00 mg/dL Final         Passed - K in normal range and within 180 days    Potassium  Date Value Ref Range Status  11/19/2022 4.5 3.5 - 5.1 mmol/L Final         Passed - Na in normal range and within 180 days    Sodium  Date Value Ref Range Status  11/19/2022 138 135 - 145 mmol/L Final  10/30/2021 140 134 - 144 mmol/L Final         Passed - Last BP in normal range    BP Readings from Last 1 Encounters:  11/19/22 130/86

## 2022-12-26 ENCOUNTER — Telehealth: Payer: Self-pay | Admitting: Family Medicine

## 2022-12-26 ENCOUNTER — Other Ambulatory Visit: Payer: Self-pay

## 2022-12-26 DIAGNOSIS — I10 Essential (primary) hypertension: Secondary | ICD-10-CM

## 2022-12-26 MED ORDER — HYDROCHLOROTHIAZIDE 25 MG PO TABS
25.0000 mg | ORAL_TABLET | Freq: Every day | ORAL | 0 refills | Status: DC
  Filled 2022-12-26: qty 30, 30d supply, fill #0

## 2022-12-26 NOTE — Telephone Encounter (Signed)
Pt is calling in because she put in a refill request for hydrochlorothiazide (HYDRODIURIL) 25 MG tablet [[id number]] and it was denied. Pt wants to know why this medication was denied. She says she has 2 pills left. Please follow up with pt.

## 2022-12-26 NOTE — Telephone Encounter (Signed)
Spoke with patient. Advised that the reason her medication is being refused is because she has not been seen it the office in over a year. Patient has appointment on 12/28/2022. Patient encouraged to keep her appointment on Saturday.

## 2022-12-26 NOTE — Telephone Encounter (Signed)
Patient was scheduled an appointment for Saturday 12/28/2022 at 0940.  Will call in hydrochlorothiazide 25 mg to pharmacy for patient.

## 2022-12-27 ENCOUNTER — Other Ambulatory Visit: Payer: Self-pay

## 2022-12-28 ENCOUNTER — Ambulatory Visit: Payer: Medicaid Other | Admitting: Family

## 2023-01-13 ENCOUNTER — Ambulatory Visit (HOSPITAL_COMMUNITY)
Admission: EM | Admit: 2023-01-13 | Discharge: 2023-01-13 | Disposition: A | Discharge status: Home / Self Care | Payer: Medicaid Other | Attending: Emergency Medicine | Admitting: Emergency Medicine

## 2023-01-13 ENCOUNTER — Encounter (HOSPITAL_COMMUNITY): Payer: Self-pay | Admitting: Emergency Medicine

## 2023-01-13 ENCOUNTER — Other Ambulatory Visit: Payer: Self-pay

## 2023-01-13 DIAGNOSIS — K5900 Constipation, unspecified: Secondary | ICD-10-CM | POA: Diagnosis not present

## 2023-01-13 DIAGNOSIS — R140 Abdominal distension (gaseous): Secondary | ICD-10-CM | POA: Insufficient documentation

## 2023-01-13 LAB — BASIC METABOLIC PANEL
Anion gap: 9 (ref 5–15)
BUN: 12 mg/dL (ref 6–20)
CO2: 27 mmol/L (ref 22–32)
Calcium: 9.4 mg/dL (ref 8.9–10.3)
Chloride: 104 mmol/L (ref 98–111)
Creatinine, Ser: 1.02 mg/dL — ABNORMAL HIGH (ref 0.44–1.00)
GFR, Estimated: >60 mL/min (ref >60)
Glucose, Bld: 112 mg/dL — ABNORMAL HIGH (ref 70–99)
Potassium: 3.9 mmol/L (ref 3.5–5.1)
Sodium: 140 mmol/L (ref 135–145)

## 2023-01-13 LAB — CBC
HCT: 33.1 % — ABNORMAL LOW (ref 36.0–46.0)
Hemoglobin: 10.1 g/dL — ABNORMAL LOW (ref 12.0–15.0)
MCH: 24.3 pg — ABNORMAL LOW (ref 26.0–34.0)
MCHC: 30.5 g/dL (ref 30.0–36.0)
MCV: 79.8 fL — ABNORMAL LOW (ref 80.0–100.0)
Platelets: 342 10*3/uL (ref 150–400)
RBC: 4.15 MIL/uL (ref 3.87–5.11)
RDW: 15.6 % — ABNORMAL HIGH (ref 11.5–15.5)
WBC: 9.8 10*3/uL (ref 4.0–10.5)
nRBC: 0 % (ref 0.0–0.2)

## 2023-01-13 NOTE — Discharge Instructions (Addendum)
Your blood work will come back tomorrow and someone will call if results are not concerning and require further evaluation.  Otherwise please follow-up with your primary care doctor regarding your symptoms and to get possible referral to gastroenterologist.  You can start taking MiraLAX daily as needed for constipation. If you develop severe abdominal pain, excessive vomiting, or severe shortness of breath please seek immediate medical treatment in the ER.

## 2023-01-13 NOTE — ED Provider Notes (Signed)
[facility]    CSN: [id number] Arrival date & time: 01/13/23  1644      History   Chief Complaint Chief Complaint  Patient presents with   Bloated    HPI Crystal Burns is a 42 y.o. female.   Patient presents with mild abdominal distention, abdominal pain, bloating, and constipation x 5 days.  Denies taking thing over-the-counter for symptoms.  Denies nausea, vomiting, diarrhea, chest pain, back pain, fever, and shortness of breath.     Past Medical History:  Diagnosis Date   Asthma    2010-only bothers pt during bad cold   Depression    2010-had when mother passed away   History of gonorrhea    Hypertension    2004   Obesity    Pre-diabetes    Preeclampsia complicating hypertension 01/21/2015    Patient Active Problem List   Diagnosis Date Noted   Bilateral primary osteoarthritis of knee 02/28/2020   Precordial chest pain 05/03/2019   Educated about COVID-19 virus infection 05/03/2019   Hoarseness of voice 03/17/2018   Gastroesophageal reflux disease without esophagitis 03/17/2018   Supervision of normal pregnancy, antepartum 12/15/2017   Acute medial meniscus tear of right knee 02/10/2017   Vitamin D deficiency 07/10/2015   Anxiety 06/20/2015   Costochondritis 05/18/2015   Primary osteoarthritis of right knee 04/22/2014   Essential hypertension 07/18/2012   Sprain of ligament of lumbosacral joint 07/05/2008   Morbid obesity (HCC) 02/24/2008   Asthma with exacerbation 02/24/2008    Past Surgical History:  Procedure Laterality Date   CHOLECYSTECTOMY     DILATION AND CURETTAGE OF UTERUS     WISDOM TOOTH EXTRACTION      OB History     Gravida  11   Para  7   Term  7   Preterm  0   AB  4   Living  6      SAB  4   IAB  0   Ectopic  0   Multiple  0   Live Births  7            Home Medications    Prior to Admission medications   Medication Sig Start Date End Date Taking? Authorizing Provider  albuterol  (PROVENTIL) (2.5 MG/3ML) 0.083% nebulizer solution Take 3 mLs (2.5 mg total) by nebulization every 6 (six) hours as needed for wheezing or shortness of breath. 06/03/22   Carlisle Beers, FNP  albuterol (VENTOLIN HFA) 108 (90 Base) MCG/ACT inhaler Inhale 2 puffs into the lungs every 4 (four) hours as needed for wheezing or shortness of breath (Cough). 11/19/22   Domenick Gong, MD  atorvastatin (LIPITOR) 20 MG tablet Take 1 tablet (20 mg total) by mouth daily. 10/31/21   Hoy Register, MD  fluticasone (FLONASE) 50 MCG/ACT nasal spray Place 1 spray into both nostrils daily. 01/17/22   Raspet, Noberto Retort, PA-C  hydrochlorothiazide (HYDRODIURIL) 25 MG tablet Take 1 tablet (25 mg total) by mouth daily. 12/26/22   Hoy Register, MD  Iron, Ferrous Sulfate, 325 (65 Fe) MG TABS Take 1 tablet by mouth daily. 10/31/21   Hoy Register, MD  losartan (COZAAR) 50 MG tablet Take 1 tablet (50 mg total) by mouth daily. 04/25/22 04/25/23  Hoy Register, MD  metFORMIN (GLUCOPHAGE) 500 MG tablet Take 2 tablets (1,000 mg total) by mouth 2 (two) times daily with a meal. 01/04/22     naproxen (NAPROSYN) 500 MG tablet Take 1 tablet (500 mg total) by mouth 2 (  two) times daily. 11/19/22   Domenick Gong, MD  promethazine-dextromethorphan (PROMETHAZINE-DM) 6.25-15 MG/5ML syrup Take 5 mLs by mouth 4 (four) times daily as needed for cough. 11/19/22   Domenick Gong, MD  Spacer/Aero-Holding Chambers (AEROCHAMBER MV) inhaler Use as instructed 11/19/22   Domenick Gong, MD  traMADol (ULTRAM) 50 MG tablet Take 1 tablet (50 mg total) by mouth 3 (three) times daily as needed. 05/21/22   Cristie Hem, PA-C    Family History Family History  Problem Relation Age of Onset   Breast cancer Mother    Hypertension Mother    Diabetes Mother    Cancer Mother        Breast    Heart attack Mother 41   Hypertension Father    Diabetes Father    Hyperlipidemia Father    Arthritis Other    Asthma Other    Breast cancer Other     Heart failure Other    Congenital heart disease Other    Depression Other    Heart attack Other     Social History Social History   Tobacco Use   Smoking status: Former    Current packs/day: 0.00    Types: Cigarettes    Quit date: 10/31/2010    Years since quitting: 12.2   Smokeless tobacco: Never  Vaping Use   Vaping status: Never Used  Substance Use Topics   Alcohol use: No    Alcohol/week: 0.0 standard drinks of alcohol   Drug use: No     Allergies   Bee venom, Lisinopril, and Shellfish allergy   Review of Systems Review of Systems  Constitutional:  Negative for fever.  Respiratory:  Negative for chest tightness, shortness of breath and wheezing.   Cardiovascular:  Negative for chest pain.  Gastrointestinal:  Positive for abdominal distention, abdominal pain and constipation. Negative for diarrhea, nausea and vomiting.  Neurological:  Negative for dizziness, weakness and headaches.     Physical Exam Triage Vital Signs ED Triage Vitals  Encounter Vitals Group     BP 01/13/23 1807 (!) 148/92     Systolic BP Percentile --      Diastolic BP Percentile --      Pulse Rate 01/13/23 1807 80     Resp 01/13/23 1807 16     Temp 01/13/23 1807 98.7 F (37.1 C)     Temp Source 01/13/23 1807 Oral     SpO2 01/13/23 1807 96 %     Weight --      Height --      Head Circumference --      Peak Flow --      Pain Score 01/13/23 1806 5     Pain Loc --      Pain Education --      Exclude from Growth Chart --    No data found.  Updated Vital Signs BP (!) 148/92 (BP Location: Left Arm)   Pulse 80   Temp 98.7 F (37.1 C) (Oral)   Resp 16   SpO2 96%   Visual Acuity Right Eye Distance:   Left Eye Distance:   Bilateral Distance:    Right Eye Near:   Left Eye Near:    Bilateral Near:     Physical Exam Vitals and nursing note reviewed.  Constitutional:      General: She is awake. She is not in acute distress.    Appearance: Normal appearance. She is  well-developed and well-groomed. She is obese. She is not ill-appearing, toxic-appearing  or diaphoretic.  Cardiovascular:     Rate and Rhythm: Normal rate.     Heart sounds: Normal heart sounds.  Pulmonary:     Effort: Pulmonary effort is normal.     Breath sounds: Normal breath sounds.  Abdominal:     General: Abdomen is protuberant. Bowel sounds are normal. There is distension.     Tenderness: There is no abdominal tenderness. There is no right CVA tenderness, left CVA tenderness, guarding or rebound.     Comments: Mild distention noted to abdomen without tenderness.  Skin:    General: Skin is warm and dry.  Neurological:     Mental Status: She is alert.  Psychiatric:        Behavior: Behavior is cooperative.      UC Treatments / Results  Labs (all labs ordered are listed, but only abnormal results are displayed) Labs Reviewed  CBC  BASIC METABOLIC PANEL    EKG   Radiology No results found.  Procedures Procedures (including critical care time)  Medications Ordered in UC Medications - No data to display  Initial Impression / Assessment and Plan / UC Course  I have reviewed the triage vital signs and the nursing notes.  Pertinent labs & imaging results that were available during my care of the patient were reviewed by me and considered in my medical decision making (see chart for details).     Patient presented with mild abdominal distention, abdominal pain, bloating, and constipation x 5 days.  Patient reports chronic issues with constipation.  Upon assessment patient has mild distention noted to abdomen without tenderness.  Ordered CBC and BMP for further evaluation.  Recommended using MiraLAX to help with constipation.  Recommended following up with primary care for further management and possible referral to gastroenterologist.  Discussed return, follow-up, strict emergency department precautions. Final Clinical Impressions(s) / UC Diagnoses   Final diagnoses:   Abdominal distention  Constipation, unspecified constipation type     Discharge Instructions      Your blood work will come back tomorrow and someone will call if results are not concerning and require further evaluation.  Otherwise please follow-up with your primary care doctor regarding your symptoms and to get possible referral to gastroenterologist.  You can start taking MiraLAX daily as needed for constipation. If you develop severe abdominal pain, excessive vomiting, or severe shortness of breath please seek immediate medical treatment in the ER.    ED Prescriptions   None    PDMP not reviewed this encounter.   Wynonia Lawman A, NP 01/13/23 1946

## 2023-01-13 NOTE — ED Triage Notes (Signed)
Patient states that for 5 days she has noticed that her stomach has looked "different" and that she has had abdominal pain and bloating with some constipation. She has not tried anything OTC for relief.

## 2023-01-16 ENCOUNTER — Encounter: Payer: Self-pay | Admitting: Orthopaedic Surgery

## 2023-01-16 ENCOUNTER — Ambulatory Visit: Payer: Medicaid Other | Admitting: Orthopaedic Surgery

## 2023-01-16 DIAGNOSIS — M170 Bilateral primary osteoarthritis of knee: Secondary | ICD-10-CM

## 2023-01-16 DIAGNOSIS — Z6841 Body Mass Index (BMI) 40.0 and over, adult: Secondary | ICD-10-CM | POA: Diagnosis not present

## 2023-01-16 MED ORDER — METHYLPREDNISOLONE ACETATE 40 MG/ML IJ SUSP
40.0000 mg | INTRAMUSCULAR | Status: AC | PRN
  Administered 2023-01-16: 40 mg via INTRA_ARTICULAR

## 2023-01-16 MED ORDER — BUPIVACAINE HCL 0.5 % IJ SOLN
2.0000 mL | INTRAMUSCULAR | Status: AC | PRN
  Administered 2023-01-16: 2 mL via INTRA_ARTICULAR

## 2023-01-16 MED ORDER — LIDOCAINE HCL 1 % IJ SOLN
2.0000 mL | INTRAMUSCULAR | Status: AC | PRN
  Administered 2023-01-16: 2 mL

## 2023-01-16 NOTE — Progress Notes (Signed)
Office Visit Note   Patient: Crystal Burns           Date of Birth: May 25, 1980           MRN: [id number] Visit Date: 01/16/2023              Requested by: Hoy Register, MD [address] [place],  [place] [address] PCP: Hoy Register, MD   Assessment & Plan: Visit Diagnoses:  1. Bilateral primary osteoarthritis of knee   2. Body mass index 50.0-59.9, adult [facility])     Plan: Crystal Burns is a 42 year old female with advanced bilateral knee DJD.  She elected to undergo another round of cortisone injections in both knees.  She tolerated this well.  Follow-up as needed.  Follow-Up Instructions: No follow-ups on file.   Orders:  Orders Placed This Encounter  Procedures  . Large Joint Inj: bilateral knee   No orders of the defined types were placed in this encounter.     Procedures: Large Joint Inj: bilateral knee on 01/16/2023 10:57 AM Indications: pain Details: 22 G needle  Arthrogram: No  Medications (Right): 2 mL lidocaine 1 %; 2 mL bupivacaine 0.5 %; 40 mg methylPREDNISolone acetate 40 MG/ML Medications (Left): 2 mL lidocaine 1 %; 2 mL bupivacaine 0.5 %; 40 mg methylPREDNISolone acetate 40 MG/ML Outcome: tolerated well, no immediate complications Patient was prepped and draped in the usual sterile fashion.      Clinical Data: No additional findings.   Subjective: Chief Complaint  Patient presents with  . Right Knee - Pain  . Left Knee - Pain    HPI Patient returns today for follow-up for bilateral knee pain.  She has advanced DJD. Review of Systems  Constitutional: Negative.   HENT: Negative.    Eyes: Negative.   Respiratory: Negative.    Cardiovascular: Negative.   Endocrine: Negative.   Musculoskeletal: Negative.   Neurological: Negative.   Hematological: Negative.   Psychiatric/Behavioral: Negative.    All other systems reviewed and are negative.    Objective: Vital Signs: There were no vitals taken for this  visit.  Physical Exam Vitals and nursing note reviewed.  Constitutional:      Appearance: She is well-developed.  HENT:     Head: Atraumatic.     Nose: Nose normal.  Eyes:     Extraocular Movements: Extraocular movements intact.  Cardiovascular:     Pulses: Normal pulses.  Pulmonary:     Effort: Pulmonary effort is normal.  Abdominal:     Palpations: Abdomen is soft.  Musculoskeletal:     Cervical back: Neck supple.  Skin:    General: Skin is warm.     Capillary Refill: Capillary refill takes less than 2 seconds.  Neurological:     Mental Status: She is alert. Mental status is at baseline.  Psychiatric:        Behavior: Behavior normal.        Thought Content: Thought content normal.        Judgment: Judgment normal.    Ortho Exam Exam of bilateral knees is unchanged. Specialty Comments:  No specialty comments available.  Imaging: No results found.   PMFS History: Patient Active Problem List   Diagnosis Date Noted  . Bilateral primary osteoarthritis of knee 02/28/2020  . Precordial chest pain 05/03/2019  . Educated about COVID-19 virus infection 05/03/2019  . Hoarseness of voice 03/17/2018  . Gastroesophageal reflux disease without esophagitis 03/17/2018  . Supervision of normal pregnancy, antepartum 12/15/2017  .  Acute medial meniscus tear of right knee 02/10/2017  . Vitamin D deficiency 07/10/2015  . Anxiety 06/20/2015  . Costochondritis 05/18/2015  . Primary osteoarthritis of right knee 04/22/2014  . Essential hypertension 07/18/2012  . Sprain of ligament of lumbosacral joint 07/05/2008  . Morbid obesity (HCC) 02/24/2008  . Asthma with exacerbation 02/24/2008   Past Medical History:  Diagnosis Date  . Asthma    2010-only bothers pt during bad cold  . Depression    2010-had when mother passed away  . History of gonorrhea   . Hypertension    2004  . Obesity   . Pre-diabetes   . Preeclampsia complicating hypertension 01/21/2015    Family History   Problem Relation Age of Onset  . Breast cancer Mother   . Hypertension Mother   . Diabetes Mother   . Cancer Mother        Breast   . Heart attack Mother 60  . Hypertension Father   . Diabetes Father   . Hyperlipidemia Father   . Arthritis Other   . Asthma Other   . Breast cancer Other   . Heart failure Other   . Congenital heart disease Other   . Depression Other   . Heart attack Other     Past Surgical History:  Procedure Laterality Date  . CHOLECYSTECTOMY    . DILATION AND CURETTAGE OF UTERUS    . WISDOM TOOTH EXTRACTION     Social History   Occupational History  . Not on file  Tobacco Use  . Smoking status: Former    Current packs/day: 0.00    Types: Cigarettes    Quit date: 10/31/2010    Years since quitting: 12.2  . Smokeless tobacco: Never  Vaping Use  . Vaping status: Never Used  Substance and Sexual Activity  . Alcohol use: No    Alcohol/week: 0.0 standard drinks of alcohol  . Drug use: No  . Sexual activity: Not Currently    Partners: Male    Birth control/protection: Injection

## 2023-01-21 ENCOUNTER — Other Ambulatory Visit: Payer: Self-pay

## 2023-01-22 ENCOUNTER — Other Ambulatory Visit: Payer: Self-pay

## 2023-01-30 NOTE — Plan of Care (Signed)
[facility] Tonsillectomy/Adenoidectomy, Postoperative PEDS care plan entered in error.

## 2023-02-04 ENCOUNTER — Other Ambulatory Visit: Payer: Self-pay | Admitting: Family Medicine

## 2023-02-04 ENCOUNTER — Other Ambulatory Visit: Payer: Self-pay

## 2023-02-04 DIAGNOSIS — I10 Essential (primary) hypertension: Secondary | ICD-10-CM

## 2023-02-06 ENCOUNTER — Encounter: Payer: Self-pay | Admitting: Family Medicine

## 2023-02-06 ENCOUNTER — Other Ambulatory Visit: Payer: Self-pay

## 2023-02-06 ENCOUNTER — Ambulatory Visit: Payer: Medicaid Other | Attending: Family Medicine | Admitting: Family Medicine

## 2023-02-06 VITALS — BP 124/85 | HR 75 | Ht 63.0 in | Wt 318.8 lb

## 2023-02-06 DIAGNOSIS — Z1231 Encounter for screening mammogram for malignant neoplasm of breast: Secondary | ICD-10-CM | POA: Diagnosis not present

## 2023-02-06 DIAGNOSIS — R7303 Prediabetes: Secondary | ICD-10-CM | POA: Diagnosis not present

## 2023-02-06 DIAGNOSIS — R140 Abdominal distension (gaseous): Secondary | ICD-10-CM

## 2023-02-06 DIAGNOSIS — R87610 Atypical squamous cells of undetermined significance on cytologic smear of cervix (ASC-US): Secondary | ICD-10-CM | POA: Diagnosis not present

## 2023-02-06 DIAGNOSIS — R87810 Cervical high risk human papillomavirus (HPV) DNA test positive: Secondary | ICD-10-CM | POA: Diagnosis not present

## 2023-02-06 DIAGNOSIS — I10 Essential (primary) hypertension: Secondary | ICD-10-CM

## 2023-02-06 DIAGNOSIS — K5909 Other constipation: Secondary | ICD-10-CM

## 2023-02-06 MED ORDER — PRAVASTATIN SODIUM 20 MG PO TABS
20.0000 mg | ORAL_TABLET | Freq: Every day | ORAL | 1 refills | Status: DC
  Filled 2023-02-06: qty 90, 90d supply, fill #0
  Filled 2023-05-01 – 2023-05-29 (×2): qty 90, 90d supply, fill #1

## 2023-02-06 MED ORDER — HYDROCHLOROTHIAZIDE 25 MG PO TABS
25.0000 mg | ORAL_TABLET | Freq: Every day | ORAL | 1 refills | Status: DC
  Filled 2023-02-06: qty 90, 90d supply, fill #0
  Filled 2023-05-01 – 2023-05-29 (×2): qty 90, 90d supply, fill #1

## 2023-02-06 MED ORDER — ALBUTEROL SULFATE (2.5 MG/3ML) 0.083% IN NEBU
2.5000 mg | INHALATION_SOLUTION | Freq: Four times a day (QID) | RESPIRATORY_TRACT | 6 refills | Status: AC | PRN
  Filled 2023-02-06: qty 75, 7d supply, fill #0

## 2023-02-06 NOTE — Progress Notes (Addendum)
Subjective:  Patient ID: Crystal Burns, female    DOB: [birth year]  Age: 42 y.o. MRN: [id number]  CC: Constipation (Patient is also experiencing gas and bloating, 2 possible hernia toward top of abdomen and navel. Patient also states that there is some pain when she cleans her navel. ) and Weight Loss   HPI Crystal Burns is a 42 y.o. year old female with a history of hypertension, morbid obesity, prediabetes .  Interval History: Discussed the use of AI scribe software for clinical note transcription with the patient, who gave verbal consent to proceed.  She presents with abdominal bloating and constipation. She reports a severe stomach ache, likened to labor pains, and noticed bulges on her stomach. These symptoms prompted a visit to the emergency room three weeks ago, where she was diagnosed with constipation and prescribed Miralax, which has improved her symptoms. She also reports a knot in her navel that hurts when cleaned but is otherwise asymptomatic.  In addition to these symptoms, the patient reports intermittent bleeding for three days during the time of her severe stomach ache. She has not had a regular menstrual cycle for over a year due to birth control shots, which she has since stopped taking. She also reports experiencing hot flashes. 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  The patient has been taking losartan and hydrochlorothiazide for hypertension, which is well-controlled. She was previously on atorvastatin for hyperlipidemia but stopped due to headaches; would like to try something different for her cholesterol. She is also on metformin for prediabetes.   She is also interested in weight loss.    Past Medical History:  Diagnosis Date   Asthma    2010-only bothers pt during bad cold   Depression    2010-had when mother passed away   History of gonorrhea    Hypertension    2004   Obesity    Pre-diabetes     Preeclampsia complicating hypertension 01/21/2015    Past Surgical History:  Procedure Laterality Date   CHOLECYSTECTOMY     DILATION AND CURETTAGE OF UTERUS     WISDOM TOOTH EXTRACTION      Family History  Problem Relation Age of Onset   Breast cancer Mother    Hypertension Mother    Diabetes Mother    Cancer Mother        Breast    Heart attack Mother 45   Hypertension Father    Diabetes Father    Hyperlipidemia Father    Arthritis Other    Asthma Other    Breast cancer Other    Heart failure Other    Congenital heart disease Other    Depression Other    Heart attack Other     Social History   Socioeconomic History   Marital status: Single    Spouse name: Not on file   Number of children: Not on file   Years of education: Not on file   Highest education level: Not on file  Occupational History   Not on file  Tobacco Use   Smoking status: Former    Current packs/day: 0.00    Types: Cigarettes    Quit date: 10/31/2010    Years since quitting: 12.2   Smokeless tobacco: Never  Vaping Use   Vaping status: Never Used  Substance and Sexual Activity   Alcohol use: No    Alcohol/week: 0.0 standard drinks of alcohol   Drug use: No  Sexual activity: Not Currently    Partners: Male    Birth control/protection: Injection  Other Topics Concern   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     Social Determinants of Health   Financial Resource Strain: Low Risk  (02/06/2023)   Overall Financial Resource Strain (CARDIA)    Difficulty of Paying Living Expenses: Not hard at all  Food Insecurity: No Food Insecurity (02/06/2023)   Hunger Vital Sign    Worried About Running Out of Food in the Last Year: Never true    Ran Out of Food in the Last Year: Never true  Transportation Needs: No Transportation Needs (02/06/2023)   PRAPARE - Administrator, Civil Service (Medical): No    Lack of  Transportation (Non-Medical): No  Physical Activity: Inactive (02/06/2023)   Exercise Vital Sign    Days of Exercise per Week: 3 days    Minutes of Exercise per Session: 0 min  Stress: No Stress Concern Present (02/06/2023)   [employer] of Occupational Health - Occupational Stress Questionnaire    Feeling of Stress : Not at all  Social Connections: Moderately Integrated (02/06/2023)   Social Connection and Isolation Panel [NHANES]    Frequency of Communication with Friends and Family: More than three times a week    Frequency of Social Gatherings with Friends and Family: More than three times a week    Attends Religious Services: 1 to 4 times per year    Active Member of [employer] or Organizations: Yes    Attends Banker Meetings: 1 to 4 times per year    Marital Status: Never married    Allergies  Allergen Reactions   Bee Venom Hives and Swelling   Lisinopril Shortness Of Breath and Cough   Shellfish Allergy Anaphylaxis    Outpatient Medications Prior to Visit  Medication Sig Dispense Refill   albuterol (VENTOLIN HFA) 108 (90 Base) MCG/ACT inhaler Inhale 2 puffs into the lungs every 4 (four) hours as needed for wheezing or shortness of breath (Cough). 18 g 0   fluticasone (FLONASE) 50 MCG/ACT nasal spray Place 1 spray into both nostrils daily. 16 g 0   Iron, Ferrous Sulfate, 325 (65 Fe) MG TABS Take 1 tablet by mouth daily. 60 tablet 3   losartan (COZAAR) 50 MG tablet Take 1 tablet (50 mg total) by mouth daily. 30 tablet 11   metFORMIN (GLUCOPHAGE) 500 MG tablet Take 2 tablets (1,000 mg total) by mouth 2 (two) times daily with a meal. 120 tablet 6   albuterol (PROVENTIL) (2.5 MG/3ML) 0.083% nebulizer solution Take 3 mLs (2.5 mg total) by nebulization every 6 (six) hours as needed for wheezing or shortness of breath. 75 mL 12   hydrochlorothiazide (HYDRODIURIL) 25 MG tablet Take 1 tablet (25 mg total) by mouth daily. 30 tablet 0   Spacer/Aero-Holding Chambers  (AEROCHAMBER MV) inhaler Use as instructed (Patient not taking: Reported on 02/06/2023) 1 each 1   atorvastatin (LIPITOR) 20 MG tablet Take 1 tablet (20 mg total) by mouth daily. (Patient not taking: Reported on 02/06/2023) 30 tablet 3   naproxen (NAPROSYN) 500 MG tablet Take 1 tablet (500 mg total) by mouth 2 (two) times daily. (Patient not taking: Reported on 02/06/2023) 20 tablet 0   promethazine-dextromethorphan (PROMETHAZINE-DM) 6.25-15 MG/5ML syrup Take 5 mLs by mouth 4 (four) times daily as needed for cough. (Patient not taking: Reported on 02/06/2023) 118 mL 0  traMADol (ULTRAM) 50 MG tablet Take 1 tablet (50 mg total) by mouth 3 (three) times daily as needed. (Patient not taking: Reported on 02/06/2023) 30 tablet 2   No facility-administered medications prior to visit.     ROS Review of Systems  Constitutional:  Negative for activity change and appetite change.  HENT:  Negative for sinus pressure and sore throat.   Respiratory:  Negative for chest tightness, shortness of breath and wheezing.   Cardiovascular:  Negative for chest pain and palpitations.  Gastrointestinal:  Negative for abdominal distention, abdominal pain and constipation.  Genitourinary: Negative.   Musculoskeletal: Negative.   Psychiatric/Behavioral:  Negative for behavioral problems and dysphoric mood.     Objective:  BP 124/85 (BP Location: Left Arm, Patient Position: Sitting, Cuff Size: Large)   Pulse 75   Ht 5\' 3"  (1.6 m)   Wt (!) 318 lb 12.8 oz (144.6 kg)   LMP 01/13/2023 (Approximate)   SpO2 100%   BMI 56.47 kg/m      02/06/2023    9:44 AM 01/13/2023    6:07 PM 11/19/2022   12:48 PM  BP/Weight  Systolic BP 124 148 130  Diastolic BP 85 92 86  Wt. (Lbs) 318.8  317.46  BMI 56.47 kg/m2  56.24 kg/m2      Physical Exam Constitutional:      Appearance: She is well-developed.  Cardiovascular:     Rate and Rhythm: Normal rate.     Heart sounds: Normal heart sounds. No murmur heard. Pulmonary:      Effort: Pulmonary effort is normal.     Breath sounds: Normal breath sounds. No wheezing or rales.  Chest:     Chest wall: No tenderness.  Abdominal:     General: Bowel sounds are normal. There is distension (more prominent outpouching in RUQ and LUQ).     Palpations: Abdomen is soft. There is no mass.     Tenderness: There is no abdominal tenderness.  Musculoskeletal:        General: Normal range of motion.     Right lower leg: No edema.     Left lower leg: No edema.  Neurological:     Mental Status: She is alert and oriented to person, place, and time.  Psychiatric:        Mood and Affect: Mood normal.        Latest Ref Rng & Units 01/13/2023    6:30 PM 11/19/2022    1:36 PM 10/30/2021   10:09 AM  CMP  Glucose 70 - 99 mg/dL 409  78  811   BUN 6 - 20 mg/dL 12  10  16    Creatinine 0.44 - 1.00 mg/dL 9.14  7.82  9.56   Sodium 135 - 145 mmol/L 140  138  140   Potassium 3.5 - 5.1 mmol/L 3.9  4.5  4.0   Chloride 98 - 111 mmol/L 104  100  102   CO2 22 - 32 mmol/L 27  27  24    Calcium 8.9 - 10.3 mg/dL 9.4  9.5  9.8   Total Protein 6.0 - 8.5 g/dL   6.6   Total Bilirubin 0.0 - 1.2 mg/dL   <2.1   Alkaline Phos 44 - 121 IU/L   86   AST 0 - 40 IU/L   8   ALT 0 - 32 IU/L   16     Lipid Panel     Component Value Date/Time   CHOL 194 10/30/2021 1009   TRIG  108 10/30/2021 1009   HDL 34 (L) 10/30/2021 1009   CHOLHDL 5.1 (H) 07/23/2016 1508   LDLCALC 140 (H) 10/30/2021 1009    CBC    Component Value Date/Time   WBC 9.8 01/13/2023 1830   RBC 4.15 01/13/2023 1830   HGB 10.1 (L) 01/13/2023 1830   HGB 10.6 (L) 10/30/2021 1009   HCT 33.1 (L) 01/13/2023 1830   HCT 34.6 10/30/2021 1009   PLT 342 01/13/2023 1830   PLT 387 10/30/2021 1009   MCV 79.8 (L) 01/13/2023 1830   MCV 78 (L) 10/30/2021 1009   MCH 24.3 (L) 01/13/2023 1830   MCHC 30.5 01/13/2023 1830   RDW 15.6 (H) 01/13/2023 1830   RDW 15.2 10/30/2021 1009   LYMPHSABS 2.7 10/30/2021 1009   MONOABS 0.4 11/11/2017 1616    EOSABS 0.3 10/30/2021 1009   BASOSABS 0.1 10/30/2021 1009    Lab Results  Component Value Date   HGBA1C 6.0 (H) 10/30/2021    Assessment & Plan:      Abdominal Distension Noted abdominal distension for the past month. No associated pain. On examination, appears to be subcutaneous fat. -No further intervention at this time.  Constipation Recent episode of severe abdominal pain due to constipation. Currently managed with Miralax with noted improvement. -Continue Miralax as needed.  ASCUS, HPV positive/menstrual Irregularities Reports spotting and irregular periods after discontinuing birth control shot. Recent episode of bleeding associated with severe abdominal pain. -Refer to Gynecology for further evaluation and management. -History of ASCUS, high risk HPV x 2, needs colposcopy  Prediabetes A1c of 6.2, indicating prediabetes. Currently managed with Metformin. -Continue Metformin. -Refer to weight management clinic. -Recommend dietary restriction to less than 1200 calories per day and exercise for at least 150 minutes per week.  Hyperlipidemia Reports pain with Atorvastatin. -Discontinue Atorvastatin. -Start Pravastatin 20mg  at night.  Hypertension Well controlled on Losartan and Hydrochlorothiazide. -Refill Losartan and Hydrochlorothiazide for 90 days. -Counseled on blood pressure goal of less than 130/80, low-sodium, DASH diet, medication compliance, 150 minutes of moderate intensity exercise per week. Discussed medication compliance, adverse effects.  Encounter for screening for mammogram Mammogram ordered  Morbid obesity Avoid late meals, restrict caloric intake to less than 1200/day, increase physical activity Will refer to weight management clinic       Meds ordered this encounter  Medications   hydrochlorothiazide (HYDRODIURIL) 25 MG tablet    Sig: Take 1 tablet (25 mg total) by mouth daily.    Dispense:  90 tablet    Refill:  1   albuterol  (PROVENTIL) (2.5 MG/3ML) 0.083% nebulizer solution    Sig: Take 3 mLs (2.5 mg total) by nebulization every 6 (six) hours as needed for wheezing or shortness of breath.    Dispense:  75 mL    Refill:  6   pravastatin (PRAVACHOL) 20 MG tablet    Sig: Take 1 tablet (20 mg total) by mouth daily.    Dispense:  90 tablet    Refill:  1    Discontinue Atorvastatin    Follow-up: Return in about 6 months (around 08/06/2023) for CPE/ Preventive Health Exam.       Hoy Register, MD, FAAFP. [facility] [place], [place] [phone]   02/06/2023, 11:05 AM

## 2023-02-06 NOTE — Patient Instructions (Signed)
[facility]  [address]  [place], [place] [address] 336 [phone]

## 2023-02-07 ENCOUNTER — Other Ambulatory Visit: Payer: Self-pay

## 2023-02-10 ENCOUNTER — Other Ambulatory Visit: Payer: Self-pay

## 2023-02-21 ENCOUNTER — Other Ambulatory Visit: Payer: Self-pay

## 2023-02-21 ENCOUNTER — Other Ambulatory Visit: Payer: Self-pay | Admitting: Family Medicine

## 2023-02-21 MED FILL — Metformin HCl Tab 500 MG: ORAL | 30 days supply | Qty: 120 | Fill #0 | Status: AC

## 2023-02-28 ENCOUNTER — Ambulatory Visit: Payer: Medicaid Other

## 2023-03-07 ENCOUNTER — Ambulatory Visit (HOSPITAL_COMMUNITY)
Admission: EM | Admit: 2023-03-07 | Discharge: 2023-03-07 | Disposition: A | Discharge status: Home / Self Care | Payer: Medicaid Other | Attending: Internal Medicine | Admitting: Internal Medicine

## 2023-03-07 ENCOUNTER — Ambulatory Visit (INDEPENDENT_AMBULATORY_CARE_PROVIDER_SITE_OTHER): Payer: Medicaid Other

## 2023-03-07 ENCOUNTER — Encounter (HOSPITAL_COMMUNITY): Payer: Self-pay | Admitting: Emergency Medicine

## 2023-03-07 DIAGNOSIS — W19XXXA Unspecified fall, initial encounter: Secondary | ICD-10-CM

## 2023-03-07 DIAGNOSIS — M25561 Pain in right knee: Secondary | ICD-10-CM | POA: Diagnosis not present

## 2023-03-07 DIAGNOSIS — M1711 Unilateral primary osteoarthritis, right knee: Secondary | ICD-10-CM | POA: Diagnosis not present

## 2023-03-07 DIAGNOSIS — M25571 Pain in right ankle and joints of right foot: Secondary | ICD-10-CM | POA: Diagnosis not present

## 2023-03-07 DIAGNOSIS — M79671 Pain in right foot: Secondary | ICD-10-CM

## 2023-03-07 DIAGNOSIS — M25461 Effusion, right knee: Secondary | ICD-10-CM | POA: Diagnosis not present

## 2023-03-07 NOTE — ED Triage Notes (Signed)
Pt presents after she fell two days ago at work. C/o right knee pain. States she already has problems with that knee and needs a knee replacement

## 2023-03-07 NOTE — Discharge Instructions (Signed)
X-rays are normal.  Brace is applied to knee and ankle.  Elevate and apply ice.  Follow-up with orthopedist.

## 2023-03-07 NOTE — ED Provider Notes (Addendum)
[facility]    CSN: [id number] Arrival date & time: 03/07/23  1440      History   Chief Complaint No chief complaint on file.   HPI Crystal Burns is a 42 y.o. female.   Patient presents with right knee pain, right ankle pain, right foot pain that started about 2 days ago after a fall at work.  Patient states that she is not exactly sure how she fell but noticed that she slipped and her foot went sideways.  She has been having subsequent pain ever since.  She has taken Tylenol and ibuprofen with minimal improvement in pain.  Reports bearing weight causes pain.  She does have a history of chronic knee pain due to arthritis and meniscus tear in the right knee where she is followed by orthopedics.  She is not exactly sure how she tore her meniscus in the past but denies any previous injuries. Denies hitting head or losing consciousness.      Past Medical History:  Diagnosis Date   Asthma    2010-only bothers pt during bad cold   Depression    2010-had when mother passed away   History of gonorrhea    Hypertension    2004   Obesity    Pre-diabetes    Preeclampsia complicating hypertension 01/21/2015    Patient Active Problem List   Diagnosis Date Noted   Bilateral primary osteoarthritis of knee 02/28/2020   Precordial chest pain 05/03/2019   Educated about COVID-19 virus infection 05/03/2019   Hoarseness of voice 03/17/2018   Gastroesophageal reflux disease without esophagitis 03/17/2018   Supervision of normal pregnancy, antepartum 12/15/2017   Acute medial meniscus tear of right knee 02/10/2017   Vitamin D deficiency 07/10/2015   Anxiety 06/20/2015   Costochondritis 05/18/2015   Primary osteoarthritis of right knee 04/22/2014   Essential hypertension 07/18/2012   Sprain of ligament of lumbosacral joint 07/05/2008   Morbid obesity (HCC) 02/24/2008   Asthma with exacerbation 02/24/2008    Past Surgical History:  Procedure Laterality Date    CHOLECYSTECTOMY     DILATION AND CURETTAGE OF UTERUS     WISDOM TOOTH EXTRACTION      OB History     Gravida  11   Para  7   Term  7   Preterm  0   AB  4   Living  6      SAB  4   IAB  0   Ectopic  0   Multiple  0   Live Births  7            Home Medications    Prior to Admission medications   Medication Sig Start Date End Date Taking? Authorizing Provider  albuterol (PROVENTIL) (2.5 MG/3ML) 0.083% nebulizer solution Take 3 mLs (2.5 mg total) by nebulization every 6 (six) hours as needed for wheezing or shortness of breath. 02/06/23   Hoy Register, MD  albuterol (VENTOLIN HFA) 108 (90 Base) MCG/ACT inhaler Inhale 2 puffs into the lungs every 4 (four) hours as needed for wheezing or shortness of breath (Cough). 11/19/22   Domenick Gong, MD  fluticasone (FLONASE) 50 MCG/ACT nasal spray Place 1 spray into both nostrils daily. 01/17/22   Raspet, Noberto Retort, PA-C  hydrochlorothiazide (HYDRODIURIL) 25 MG tablet Take 1 tablet (25 mg total) by mouth daily. 02/06/23   Hoy Register, MD  Iron, Ferrous Sulfate, 325 (65 Fe) MG TABS Take 1 tablet by mouth daily. 10/31/21   Newlin, Odette Horns,  MD  losartan (COZAAR) 50 MG tablet Take 1 tablet (50 mg total) by mouth daily. 04/25/22 04/25/23  Hoy Register, MD  metFORMIN (GLUCOPHAGE) 500 MG tablet Take 2 tablets (1,000 mg total) by mouth 2 (two) times daily with a meal. 02/21/23   Hoy Register, MD  pravastatin (PRAVACHOL) 20 MG tablet Take 1 tablet (20 mg total) by mouth daily. 02/06/23   Hoy Register, MD  Spacer/Aero-Holding Deretha Emory (AEROCHAMBER MV) inhaler Use as instructed Patient not taking: Reported on 02/06/2023 11/19/22   Domenick Gong, MD    Family History Family History  Problem Relation Age of Onset   Breast cancer Mother    Hypertension Mother    Diabetes Mother    Cancer Mother        Breast    Heart attack Mother 67   Hypertension Father    Diabetes Father    Hyperlipidemia Father    Arthritis  Other    Asthma Other    Breast cancer Other    Heart failure Other    Congenital heart disease Other    Depression Other    Heart attack Other     Social History Social History   Tobacco Use   Smoking status: Former    Current packs/day: 0.00    Types: Cigarettes    Quit date: 10/31/2010    Years since quitting: 12.3   Smokeless tobacco: Never  Vaping Use   Vaping status: Never Used  Substance Use Topics   Alcohol use: No    Alcohol/week: 0.0 standard drinks of alcohol   Drug use: No     Allergies   Bee venom, Lisinopril, and Shellfish allergy   Review of Systems Review of Systems Per HPI  Physical Exam Triage Vital Signs ED Triage Vitals  Encounter Vitals Group     BP 03/07/23 1528 132/82     Systolic BP Percentile --      Diastolic BP Percentile --      Pulse Rate 03/07/23 1528 68     Resp 03/07/23 1528 17     Temp 03/07/23 1528 97.7 F (36.5 C)     Temp Source 03/07/23 1528 Oral     SpO2 03/07/23 1528 96 %     Weight --      Height --      Head Circumference --      Peak Flow --      Pain Score 03/07/23 1531 10     Pain Loc --      Pain Education --      Exclude from Growth Chart --    No data found.  Updated Vital Signs BP 132/82 (BP Location: Right Arm)   Pulse 68   Temp 97.7 F (36.5 C) (Oral)   Resp 17   LMP 01/11/2023 (Approximate) Comment: just stopped taking depo  SpO2 96%   Visual Acuity Right Eye Distance:   Left Eye Distance:   Bilateral Distance:    Right Eye Near:   Left Eye Near:    Bilateral Near:     Physical Exam Constitutional:      General: She is not in acute distress.    Appearance: Normal appearance. She is not toxic-appearing or diaphoretic.  HENT:     Head: Normocephalic and atraumatic.  Eyes:     Extraocular Movements: Extraocular movements intact.     Conjunctiva/sclera: Conjunctivae normal.  Pulmonary:     Effort: Pulmonary effort is normal.  Musculoskeletal:     Right knee: Swelling  and bony  tenderness present. Tenderness present. No LCL laxity, MCL laxity, ACL laxity or PCL laxity. Normal pulse.     Comments: Patient has tenderness to palpation throughout anterior knee.  Also has tenderness to palpation to lateral malleolus of right ankle with mild swelling noted.  Tenderness to palpation to fourth and fifth digits of toes but no tenderness to dorsal surface of foot.  No abrasions, lacerations, discoloration noted.  Capillary refill and pulses are intact.  Neurological:     General: No focal deficit present.     Mental Status: She is alert and oriented to person, place, and time. Mental status is at baseline.  Psychiatric:        Mood and Affect: Mood normal.        Behavior: Behavior normal.        Thought Content: Thought content normal.        Judgment: Judgment normal.      UC Treatments / Results  Labs (all labs ordered are listed, but only abnormal results are displayed) Labs Reviewed - No data to display  EKG   Radiology DG Ankle Complete Right  Result Date: 03/07/2023 CLINICAL DATA:  Ankle pain after fall EXAM: RIGHT ANKLE - COMPLETE 3+ VIEW COMPARISON:  None Available. FINDINGS: There is no evidence of fracture, dislocation, or joint effusion. There is no evidence of arthropathy or other focal bone abnormality. Soft tissues are unremarkable. IMPRESSION: Negative. Electronically Signed   By: Duanne Guess D.O.   On: 03/07/2023 16:26   DG Foot Complete Right  Result Date: 03/07/2023 CLINICAL DATA:  Right foot pain EXAM: RIGHT FOOT COMPLETE - 3+ VIEW COMPARISON:  None Available. FINDINGS: There is no evidence of fracture or dislocation. There is no evidence of arthropathy or other focal bone abnormality. Small bidirectional calcaneal enthesophytes. Soft tissues are unremarkable. IMPRESSION: Negative. Electronically Signed   By: Duanne Guess D.O.   On: 03/07/2023 16:26   DG Knee Complete 4 Views Right  Result Date: 03/07/2023 CLINICAL DATA:  Fall, right  knee pain EXAM: RIGHT KNEE - COMPLETE 4+ VIEW COMPARISON:  05/21/2022 FINDINGS: No acute fracture. No malalignment. Age advanced tricompartmental osteoarthritis, similar to prior. At least small knee joint effusion, assessment of which is slightly limited by obliquity. There is soft tissue swelling of the distal thigh. IMPRESSION: 1. No acute fracture or malalignment of the right knee. 2. Age advanced tricompartmental osteoarthritis. 3. At least small knee joint effusion. Electronically Signed   By: Duanne Guess D.O.   On: 03/07/2023 16:25    Procedures Procedures (including critical care time)  Medications Ordered in UC Medications - No data to display  Initial Impression / Assessment and Plan / UC Course  I have reviewed the triage vital signs and the nursing notes.  Pertinent labs & imaging results that were available during my care of the patient were reviewed by me and considered in my medical decision making (see chart for details).     X-rays are negative for any acute fracture.  Possibly small joint effusion to the knee but patient has osteoarthritis as well so unable to differentiate if this is from acute injury versus arthritis.  Will place patient in a knee brace and Ace wrap for ankle.  Advised patient she may alternate the 2 if they become uncomfortable.  Advised elevation, ice application, supportive care.  Patient offered IM Toradol but declined.  Advised patient to follow-up with her established orthopedist for further evaluation and management.  Patient verbalized understanding  and was agreeable with plan. Final Clinical Impressions(s) / UC Diagnoses   Final diagnoses:  Fall, initial encounter  Acute pain of right knee  Acute right ankle pain  Right foot pain     Discharge Instructions      X-rays are normal.  Brace is applied to knee and ankle.  Elevate and apply ice.  Follow-up with orthopedist.     ED Prescriptions   None    PDMP not reviewed this  encounter.   Gustavus Bryant, [place] 03/07/23 1658    Gustavus Bryant, [place] 03/07/23 1659

## 2023-03-11 ENCOUNTER — Ambulatory Visit: Payer: Medicaid Other | Admitting: Orthopaedic Surgery

## 2023-03-11 ENCOUNTER — Encounter: Payer: Self-pay | Admitting: Orthopaedic Surgery

## 2023-03-11 DIAGNOSIS — M25561 Pain in right knee: Secondary | ICD-10-CM

## 2023-03-11 NOTE — Progress Notes (Signed)
Office Visit Note   Patient: Crystal Burns           Date of Birth: 08/02/1980           MRN: [id number] Visit Date: 03/11/2023              Requested by: Hoy Register, MD [address] [place],  [place] [address] PCP: Hoy Register, MD   Assessment & Plan: Visit Diagnoses:  1. Acute pain of right knee     Plan: Patient is a 42 year old female with acute right knee pain due to aggravation of underlying arthritis from recent fall.  No acute abnormalities on x-rays.  I think her thigh pain is due to soft tissue contusion.  I would recommend giving this little bit more time and letting the soft tissues and the arthritis calm down.  Work note provided.  Follow-up as needed.  Follow-Up Instructions: No follow-ups on file.   Orders:  No orders of the defined types were placed in this encounter.  No orders of the defined types were placed in this encounter.     Procedures: No procedures performed   Clinical Data: No additional findings.   Subjective: Chief Complaint  Patient presents with   Right Knee - Pain    Larey Seat 03/05/2023    HPI Patient is a 42 year old female comes in for acute right knee pain status post mechanical fall at [employer] with which is where she works. Review of Systems  Constitutional: Negative.   HENT: Negative.    Eyes: Negative.   Respiratory: Negative.    Cardiovascular: Negative.   Endocrine: Negative.   Musculoskeletal: Negative.   Neurological: Negative.   Hematological: Negative.   Psychiatric/Behavioral: Negative.    All other systems reviewed and are negative.    Objective: Vital Signs: LMP 01/11/2023 (Approximate) Comment: just stopped taking depo  Physical Exam Vitals and nursing note reviewed.  Constitutional:      Appearance: She is well-developed.  HENT:     Head: Normocephalic and atraumatic.  Pulmonary:     Effort: Pulmonary effort is normal.  Abdominal:     Palpations: Abdomen is soft.   Musculoskeletal:     Cervical back: Neck supple.  Skin:    General: Skin is warm.     Capillary Refill: Capillary refill takes less than 2 seconds.  Neurological:     Mental Status: She is alert and oriented to person, place, and time.  Psychiatric:        Behavior: Behavior normal.        Thought Content: Thought content normal.        Judgment: Judgment normal.     Ortho Exam Examination of the right knee is unremarkable. Specialty Comments:  No specialty comments available.  Imaging: No results found.   PMFS History: Patient Active Problem List   Diagnosis Date Noted   Bilateral primary osteoarthritis of knee 02/28/2020   Precordial chest pain 05/03/2019   Educated about COVID-19 virus infection 05/03/2019   Hoarseness of voice 03/17/2018   Gastroesophageal reflux disease without esophagitis 03/17/2018   Supervision of normal pregnancy, antepartum 12/15/2017   Acute medial meniscus tear of right knee 02/10/2017   Vitamin D deficiency 07/10/2015   Anxiety 06/20/2015   Costochondritis 05/18/2015   Primary osteoarthritis of right knee 04/22/2014   Essential hypertension 07/18/2012   Sprain of ligament of lumbosacral joint 07/05/2008   Morbid obesity (HCC) 02/24/2008   Asthma with exacerbation 02/24/2008   Past Medical  History:  Diagnosis Date   Asthma    2010-only bothers pt during bad cold   Depression    2010-had when mother passed away   History of gonorrhea    Hypertension    2004   Obesity    Pre-diabetes    Preeclampsia complicating hypertension 01/21/2015    Family History  Problem Relation Age of Onset   Breast cancer Mother    Hypertension Mother    Diabetes Mother    Cancer Mother        Breast    Heart attack Mother 48   Hypertension Father    Diabetes Father    Hyperlipidemia Father    Arthritis Other    Asthma Other    Breast cancer Other    Heart failure Other    Congenital heart disease Other    Depression Other    Heart attack  Other     Past Surgical History:  Procedure Laterality Date   CHOLECYSTECTOMY     DILATION AND CURETTAGE OF UTERUS     WISDOM TOOTH EXTRACTION     Social History   Occupational History   Not on file  Tobacco Use   Smoking status: Former    Current packs/day: 0.00    Types: Cigarettes    Quit date: 10/31/2010    Years since quitting: 12.3   Smokeless tobacco: Never  Vaping Use   Vaping status: Never Used  Substance and Sexual Activity   Alcohol use: No    Alcohol/week: 0.0 standard drinks of alcohol   Drug use: No   Sexual activity: Not Currently    Partners: Male    Birth control/protection: Injection

## 2023-03-21 ENCOUNTER — Telehealth: Payer: Self-pay | Admitting: Orthopaedic Surgery

## 2023-03-21 ENCOUNTER — Other Ambulatory Visit: Payer: Self-pay

## 2023-03-21 NOTE — Telephone Encounter (Signed)
Sure

## 2023-03-21 NOTE — Telephone Encounter (Signed)
Pt requesting a new work note being the one she had was not good for her job she works at [employer] full time hours usually 7-8 hour shifts patients job is stating she cannot sit for her whole shift and she is wondering if she can have a note to cut her hours down to maybe part time hours (maybe 4-5 hours shifts) so she is able to sit as needed for her shifts, please advise

## 2023-03-24 NOTE — Telephone Encounter (Signed)
Note made. Ready for pick up. Patient aware.

## 2023-03-31 ENCOUNTER — Telehealth: Payer: Self-pay | Admitting: Orthopaedic Surgery

## 2023-03-31 NOTE — Telephone Encounter (Signed)
Seen 03/11/2023 for right knee pain.  Was given a note then for.Marland KitchenMarland KitchenLauna Winkelman may work 4-5 hours a day and may be able to sit as needed.  Please advise about new note and restrictions.

## 2023-03-31 NOTE — Telephone Encounter (Signed)
Pt called requesting an updated letter to employer. Pt states her job would like an estimate time span for pt to be able to sit at work. Example 1 wk, 2 wks, 1 month. Please send to pt mychart and also would like to pick it up. Pt phone number is [phone].

## 2023-04-02 NOTE — Telephone Encounter (Signed)
4-5 hrs for 3 months

## 2023-04-03 NOTE — Telephone Encounter (Signed)
Note created. Sent to MyChart, and notified patient about letter up front as well.

## 2023-04-08 ENCOUNTER — Ambulatory Visit: Payer: Medicaid Other

## 2023-05-01 ENCOUNTER — Other Ambulatory Visit: Payer: Self-pay | Admitting: Family Medicine

## 2023-05-01 ENCOUNTER — Other Ambulatory Visit: Payer: Self-pay

## 2023-05-01 ENCOUNTER — Ambulatory Visit: Payer: Medicaid Other | Admitting: Physician Assistant

## 2023-05-01 DIAGNOSIS — I10 Essential (primary) hypertension: Secondary | ICD-10-CM

## 2023-05-01 MED ORDER — LOSARTAN POTASSIUM 50 MG PO TABS
50.0000 mg | ORAL_TABLET | Freq: Every day | ORAL | 0 refills | Status: DC
  Filled 2023-05-01: qty 90, 90d supply, fill #0

## 2023-05-01 MED FILL — Metformin HCl Tab 500 MG: ORAL | 30 days supply | Qty: 120 | Fill #1 | Status: CN

## 2023-05-01 NOTE — Telephone Encounter (Signed)
Requested Prescriptions  Pending Prescriptions Disp Refills   losartan (COZAAR) 50 MG tablet 90 tablet 0    Sig: Take 1 tablet (50 mg total) by mouth daily.     Cardiovascular:  Angiotensin Receptor Blockers Failed - 05/01/2023  1:56 PM      Failed - Cr in normal range and within 180 days    Creat  Date Value Ref Range Status  04/22/2014 0.59 0.50 - 1.10 mg/dL Final   Creatinine, Ser  Date Value Ref Range Status  01/13/2023 1.02 (H) 0.44 - 1.00 mg/dL Final   Creatinine, Urine  Date Value Ref Range Status  02/06/2015 41.00 mg/dL Final         Passed - K in normal range and within 180 days    Potassium  Date Value Ref Range Status  01/13/2023 3.9 3.5 - 5.1 mmol/L Final         Passed - Patient is not pregnant      Passed - Last BP in normal range    BP Readings from Last 1 Encounters:  03/07/23 132/82         Passed - Valid encounter within last 6 months    Recent Outpatient Visits           2 months ago Other constipation   [facility] [place]. [facility] Hoy Register, MD   1 year ago Annual physical exam   [place] [place]. [facility] Hoy Register, MD   2 years ago Prediabetes   [facility] [place]. [facility] Hoy Register, MD   3 years ago Right ear pain   [place] [place]. [facility] Jillyn Hidden, [place], [place]   3 years ago Upper respiratory tract infection, unspecified type    [facility] [place]. [facility], Marzella Schlein, [place]       Future Appointments             In 3 months Hoy Register, MD [facility] Of [place]igha Bridegroom. [facility]

## 2023-05-02 ENCOUNTER — Other Ambulatory Visit: Payer: Self-pay

## 2023-05-08 ENCOUNTER — Ambulatory Visit: Payer: Medicaid Other | Admitting: Obstetrics and Gynecology

## 2023-05-08 NOTE — Progress Notes (Deleted)
    GYNECOLOGY OFFICE COLPOSCOPY PROCEDURE NOTE  43 y.o. 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 Discussed role for HPV in cervical dysplasia, need for surveillance.  Patient gave informed written consent, time out was performed.  Placed in lithotomy position. Cervix viewed with speculum and colposcope after application of acetic acid.   Colposcopy adequate? {yes/no:20286}  {Findings; colposcopy:728}; corresponding biopsies obtained.  ECC specimen obtained. All specimens were labeled and sent to pathology.  Chaperone was present during entire procedure.  Patient was given post procedure instructions.  Will follow up pathology and manage accordingly; patient will be contacted with results and recommendations.  Routine preventative health maintenance measures emphasized.    Jaynie Collins, MD, FACOG Obstetrician & Gynecologist, [facility] for [employer], [facility]

## 2023-05-12 ENCOUNTER — Encounter (HOSPITAL_COMMUNITY): Payer: Self-pay | Admitting: *Deleted

## 2023-05-12 ENCOUNTER — Ambulatory Visit (INDEPENDENT_AMBULATORY_CARE_PROVIDER_SITE_OTHER): Payer: Self-pay | Admitting: Family Medicine

## 2023-05-12 ENCOUNTER — Ambulatory Visit: Payer: Medicaid Other | Admitting: Physician Assistant

## 2023-05-12 ENCOUNTER — Ambulatory Visit (HOSPITAL_COMMUNITY)
Admission: EM | Admit: 2023-05-12 | Discharge: 2023-05-12 | Disposition: A | Discharge status: Home / Self Care | Payer: Medicaid Other | Attending: Family Medicine | Admitting: Family Medicine

## 2023-05-12 ENCOUNTER — Encounter (INDEPENDENT_AMBULATORY_CARE_PROVIDER_SITE_OTHER): Payer: Self-pay

## 2023-05-12 DIAGNOSIS — J4521 Mild intermittent asthma with (acute) exacerbation: Secondary | ICD-10-CM | POA: Insufficient documentation

## 2023-05-12 DIAGNOSIS — Z91199 Patient's noncompliance with other medical treatment and regimen due to unspecified reason: Secondary | ICD-10-CM

## 2023-05-12 DIAGNOSIS — J111 Influenza due to unidentified influenza virus with other respiratory manifestations: Secondary | ICD-10-CM | POA: Insufficient documentation

## 2023-05-12 LAB — POCT URINE PREGNANCY: Preg Test, Ur: NEGATIVE

## 2023-05-12 MED ORDER — KETOROLAC TROMETHAMINE 30 MG/ML IJ SOLN
INTRAMUSCULAR | Status: AC
Start: 2023-05-12 — End: ?
  Filled 2023-05-12: qty 1

## 2023-05-12 MED ORDER — OSELTAMIVIR PHOSPHATE 75 MG PO CAPS: 75.0000 mg | ORAL_CAPSULE | Freq: Two times a day (BID) | ORAL | 0 refills | Status: DC

## 2023-05-12 MED ORDER — KETOROLAC TROMETHAMINE 30 MG/ML IJ SOLN
30.0000 mg | Freq: Once | INTRAMUSCULAR | Status: AC
  Administered 2023-05-12: 30 mg via INTRAMUSCULAR

## 2023-05-12 MED ORDER — ALBUTEROL SULFATE (2.5 MG/3ML) 0.083% IN NEBU
2.5000 mg | INHALATION_SOLUTION | RESPIRATORY_TRACT | 0 refills | Status: AC | PRN
Start: 2023-05-12 — End: ?

## 2023-05-12 MED ORDER — IPRATROPIUM-ALBUTEROL 0.5-2.5 (3) MG/3ML IN SOLN
3.0000 mL | Freq: Once | RESPIRATORY_TRACT | Status: AC
  Administered 2023-05-12: 3 mL via RESPIRATORY_TRACT

## 2023-05-12 MED ORDER — IPRATROPIUM-ALBUTEROL 0.5-2.5 (3) MG/3ML IN SOLN
RESPIRATORY_TRACT | Status: AC
  Filled 2023-05-12: qty 3

## 2023-05-12 MED ORDER — PREDNISONE 20 MG PO TABS: 40.0000 mg | ORAL_TABLET | Freq: Every day | ORAL | 0 refills | Status: AC

## 2023-05-12 NOTE — ED Provider Notes (Addendum)
[facility]    CSN: [id number] Arrival date & time: 05/12/23  0919      History   Chief Complaint Chief Complaint  Patient presents with   Cough   Fever   Generalized Body Aches   Headache   Otalgia    HPI Crystal Burns is a 43 y.o. female.    Cough Associated symptoms: ear pain, fever and headaches   Fever Associated symptoms: cough, ear pain and headaches   Headache Associated symptoms: cough, ear pain and fever   Otalgia Associated symptoms: cough, fever and headaches   Here for cough and congestion and fever to 103 and headache.  She is also having myalgia all over and some chest tightness and wheezing.  She does have a history of asthma.  Symptoms began on January 31.  No vomiting or diarrhea.  Her last menstrual cycle was a long time ago.  She is just gotten off of Depo-Provera, and cannot tell me that without a doubt she is not pregnant.  Past Medical History:  Diagnosis Date   Asthma    2010-only bothers pt during bad cold   Depression    2010-had when mother passed away   History of gonorrhea    Hypertension    2004   Obesity    Pre-diabetes    Preeclampsia complicating hypertension 01/21/2015    Patient Active Problem List   Diagnosis Date Noted   Bilateral primary osteoarthritis of knee 02/28/2020   Precordial chest pain 05/03/2019   Educated about COVID-19 virus infection 05/03/2019   Hoarseness of voice 03/17/2018   Gastroesophageal reflux disease without esophagitis 03/17/2018   Supervision of normal pregnancy, antepartum 12/15/2017   Acute medial meniscus tear of right knee 02/10/2017   Vitamin D deficiency 07/10/2015   Anxiety 06/20/2015   Costochondritis 05/18/2015   Primary osteoarthritis of right knee 04/22/2014   Essential hypertension 07/18/2012   Sprain of ligament of lumbosacral joint 07/05/2008   Morbid obesity (HCC) 02/24/2008   Asthma with exacerbation 02/24/2008    Past Surgical History:  Procedure  Laterality Date   CHOLECYSTECTOMY     DILATION AND CURETTAGE OF UTERUS     WISDOM TOOTH EXTRACTION      OB History     Gravida  11   Para  7   Term  7   Preterm  0   AB  4   Living  6      SAB  4   IAB  0   Ectopic  0   Multiple  0   Live Births  7            Home Medications    Prior to Admission medications   Medication Sig Start Date End Date Taking? Authorizing Provider  albuterol (PROVENTIL) (2.5 MG/3ML) 0.083% nebulizer solution Take 3 mLs (2.5 mg total) by nebulization every 6 (six) hours as needed for wheezing or shortness of breath. 02/06/23  Yes Hoy Register, MD  albuterol (PROVENTIL) (2.5 MG/3ML) 0.083% nebulizer solution Take 3 mLs (2.5 mg total) by nebulization every 4 (four) hours as needed for wheezing or shortness of breath. 05/12/23  Yes Zenia Resides, MD  albuterol (VENTOLIN HFA) 108 (90 Base) MCG/ACT inhaler Inhale 2 puffs into the lungs every 4 (four) hours as needed for wheezing or shortness of breath (Cough). 11/19/22  Yes Domenick Gong, MD  fluticasone (FLONASE) 50 MCG/ACT nasal spray Place 1 spray into both nostrils daily. 01/17/22  Yes Raspet, Noberto Retort, PA-C  hydrochlorothiazide (HYDRODIURIL) 25 MG tablet Take 1 tablet (25 mg total) by mouth daily. 02/06/23  Yes Hoy Register, MD  Iron, Ferrous Sulfate, 325 (65 Fe) MG TABS Take 1 tablet by mouth daily. 10/31/21  Yes Hoy Register, MD  losartan (COZAAR) 50 MG tablet Take 1 tablet (50 mg total) by mouth daily. 05/01/23 04/30/24 Yes Hoy Register, MD  metFORMIN (GLUCOPHAGE) 500 MG tablet Take 2 tablets (1,000 mg total) by mouth 2 (two) times daily with a meal. 02/21/23  Yes Newlin, Enobong, MD  oseltamivir (TAMIFLU) 75 MG capsule Take 1 capsule (75 mg total) by mouth every 12 (twelve) hours. 05/12/23  Yes Zenia Resides, MD  pravastatin (PRAVACHOL) 20 MG tablet Take 1 tablet (20 mg total) by mouth daily. 02/06/23  Yes Hoy Register, MD  predniSONE (DELTASONE) 20 MG tablet Take 2  tablets (40 mg total) by mouth daily with breakfast for 5 days. 05/12/23 05/17/23 Yes Zenia Resides, MD  Spacer/Aero-Holding Deretha Emory (AEROCHAMBER MV) inhaler Use as instructed 11/19/22  Yes Domenick Gong, MD    Family History Family History  Problem Relation Age of Onset   Breast cancer Mother    Hypertension Mother    Diabetes Mother    Cancer Mother        Breast    Heart attack Mother 57   Hypertension Father    Diabetes Father    Hyperlipidemia Father    Arthritis Other    Asthma Other    Breast cancer Other    Heart failure Other    Congenital heart disease Other    Depression Other    Heart attack Other     Social History Social History   Tobacco Use   Smoking status: Former    Current packs/day: 0.00    Types: Cigarettes    Quit date: 10/31/2010    Years since quitting: 12.5   Smokeless tobacco: Never  Vaping Use   Vaping status: Never Used  Substance Use Topics   Alcohol use: No    Alcohol/week: 0.0 standard drinks of alcohol   Drug use: No     Allergies   Bee venom, Lisinopril, and Shellfish allergy   Review of Systems Review of Systems  Constitutional:  Positive for fever.  HENT:  Positive for ear pain.   Respiratory:  Positive for cough.   Neurological:  Positive for headaches.     Physical Exam Triage Vital Signs ED Triage Vitals  Encounter Vitals Group     BP 05/12/23 1133 (!) 134/94     Systolic BP Percentile --      Diastolic BP Percentile --      Pulse Rate 05/12/23 1133 77     Resp 05/12/23 1133 20     Temp 05/12/23 1133 98.7 F (37.1 C)     Temp Source 05/12/23 1133 Oral     SpO2 05/12/23 1133 96 %     Weight --      Height --      Head Circumference --      Peak Flow --      Pain Score 05/12/23 1131 9     Pain Loc --      Pain Education --      Exclude from Growth Chart --    No data found.  Updated Vital Signs BP (!) 134/94 (BP Location: Left Arm)   Pulse 77   Temp 98.7 F (37.1 C) (Oral)   Resp 20   LMP   (LMP Unknown)  SpO2 96%   Visual Acuity Right Eye Distance:   Left Eye Distance:   Bilateral Distance:    Right Eye Near:   Left Eye Near:    Bilateral Near:     Physical Exam Vitals reviewed.  Constitutional:      General: She is not in acute distress.    Appearance: She is not toxic-appearing.  HENT:     Right Ear: Tympanic membrane and ear canal normal.     Left Ear: Tympanic membrane and ear canal normal.     Nose: Congestion present.     Mouth/Throat:     Mouth: Mucous membranes are moist.     Comments: No erythema or tonsillar hypertrophy Eyes:     Extraocular Movements: Extraocular movements intact.     Conjunctiva/sclera: Conjunctivae normal.     Pupils: Pupils are equal, round, and reactive to light.  Cardiovascular:     Rate and Rhythm: Normal rate and regular rhythm.     Heart sounds: No murmur heard. Pulmonary:     Effort: No respiratory distress.     Breath sounds: No stridor. No rhonchi or rales.     Comments: She does have expiratory wheezes with fair air movement initially. Chest:     Chest wall: Tenderness present.  Musculoskeletal:     Cervical back: Neck supple.  Lymphadenopathy:     Cervical: No cervical adenopathy.  Skin:    Capillary Refill: Capillary refill takes less than 2 seconds.     Coloration: Skin is not jaundiced or pale.  Neurological:     General: No focal deficit present.     Mental Status: She is alert and oriented to person, place, and time.  Psychiatric:        Behavior: Behavior normal.      UC Treatments / Results  Labs (all labs ordered are listed, but only abnormal results are displayed) Labs Reviewed  SARS CORONAVIRUS 2 (TAT 6-24 HRS)  POCT URINE PREGNANCY    EKG   Radiology No results found.  Procedures Procedures (including critical care time)  Medications Ordered in UC Medications  ketorolac (TORADOL) 30 MG/ML injection 30 mg (has no administration in time range)  ipratropium-albuterol (DUONEB)  0.5-2.5 (3) MG/3ML nebulizer solution 3 mL (3 mLs Nebulization Given 05/12/23 1149)    Initial Impression / Assessment and Plan / UC Course  I have reviewed the triage vital signs and the nursing notes.  Pertinent labs & imaging results that were available during my care of the patient were reviewed by me and considered in my medical decision making (see chart for details).      Pregnancy test is negative.  DuoNeb is given here to help her wheezing  Prednisone is sent in for the asthma exacerbation.  We out of flu test kits in the building.  She is treated empirically for probable flu with Tamiflu.  COVID swab is done and if positive she is a candidate for Paxlovid and would stop the Tamiflu.  Her last EGFR in October 2024 was greater than 60. Final Clinical Impressions(s) / UC Diagnoses   Final diagnoses:  Influenza-like illness  Mild intermittent asthma with acute exacerbation     Discharge Instructions      The pregnancy test was negative.  You have been given a shot of Toradol 30 mg today.  The nebulizer treatment had albuterol and ipratropium in it.  Take oseltamivir 75 mg--1 capsule 2 times daily for 5 days  Take prednisone 20 mg--2 daily for  5 days  Use albuterol in the nebulizer every 4 hours as needed   You have been swabbed for COVID, and the test will result in the next 24 hours. Our staff will call you if positive. If the COVID test is positive, you should quarantine until you are fever free for 24 hours and you are starting to feel better, and then take added precautions for the next 5 days, such as physical distancing/wearing a mask and good hand hygiene/washing.      ED Prescriptions     Medication Sig Dispense Auth. Provider   predniSONE (DELTASONE) 20 MG tablet Take 2 tablets (40 mg total) by mouth daily with breakfast for 5 days. 10 tablet Zenia Resides, MD   oseltamivir (TAMIFLU) 75 MG capsule Take 1 capsule (75 mg total) by mouth every 12  (twelve) hours. 10 capsule Zenia Resides, MD   albuterol (PROVENTIL) (2.5 MG/3ML) 0.083% nebulizer solution Take 3 mLs (2.5 mg total) by nebulization every 4 (four) hours as needed for wheezing or shortness of breath. 225 mL Zenia Resides, MD      PDMP not reviewed this encounter.   Zenia Resides, MD 05/12/23 1206    Zenia Resides, MD 05/12/23 [phone]

## 2023-05-12 NOTE — Discharge Instructions (Addendum)
The pregnancy test was negative.  You have been given a shot of Toradol 30 mg today.  The nebulizer treatment had albuterol and ipratropium in it.  Take oseltamivir 75 mg--1 capsule 2 times daily for 5 days  Take prednisone 20 mg--2 daily for 5 days  Use albuterol in the nebulizer every 4 hours as needed   You have been swabbed for COVID, and the test will result in the next 24 hours. Our staff will call you if positive. If the COVID test is positive, you should quarantine until you are fever free for 24 hours and you are starting to feel better, and then take added precautions for the next 5 days, such as physical distancing/wearing a mask and good hand hygiene/washing.

## 2023-05-12 NOTE — Progress Notes (Signed)
 No show

## 2023-05-12 NOTE — ED Triage Notes (Signed)
Pt states she has cough, congestion, right ear pain, headache, fever since Friday. She is taking tylneol cold and flu and Robitussin. She has been using her neb.

## 2023-05-13 ENCOUNTER — Other Ambulatory Visit: Payer: Self-pay

## 2023-05-13 LAB — SARS CORONAVIRUS 2 (TAT 6-24 HRS): SARS Coronavirus 2: NEGATIVE

## 2023-05-22 ENCOUNTER — Encounter: Payer: Self-pay | Admitting: Physician Assistant

## 2023-05-22 ENCOUNTER — Ambulatory Visit: Payer: Medicaid Other | Admitting: Physician Assistant

## 2023-05-22 DIAGNOSIS — M1711 Unilateral primary osteoarthritis, right knee: Secondary | ICD-10-CM

## 2023-05-22 MED ORDER — METHYLPREDNISOLONE ACETATE 40 MG/ML IJ SUSP
13.3300 mg | INTRAMUSCULAR | Status: AC | PRN
  Administered 2023-05-22: 13.33 mg via INTRA_ARTICULAR

## 2023-05-22 MED ORDER — BUPIVACAINE HCL 0.25 % IJ SOLN
0.6600 mL | INTRAMUSCULAR | Status: AC | PRN
  Administered 2023-05-22: .66 mL via INTRA_ARTICULAR

## 2023-05-22 MED ORDER — LIDOCAINE HCL 1 % IJ SOLN
3.0000 mL | INTRAMUSCULAR | Status: AC | PRN
  Administered 2023-05-22: 3 mL

## 2023-05-22 MED ORDER — TRAMADOL HCL 50 MG PO TABS: 52.0000 mg | ORAL_TABLET | Freq: Two times a day (BID) | ORAL | 1 refills | Status: AC | PRN

## 2023-05-22 NOTE — Progress Notes (Signed)
Office Visit Note   Patient: Crystal Burns           Date of Birth: 06/29/1980           MRN: [id number] Visit Date: 05/22/2023              Requested by: Hoy Register, MD [address] [place],  [place] [address] PCP: Hoy Register, MD   Assessment & Plan: Visit Diagnoses:  1. Unilateral primary osteoarthritis, right knee     Plan: Impression is bilateral knee osteoarthritis right greater than left.  Today, we discussed various treatment options to include repeat cortisone injection for which she would like to proceed.  I have sent in a prescription for tramadol as well.  She will follow-up as needed.  Follow-Up Instructions: Return if symptoms worsen or fail to improve.   Orders:  Orders Placed This Encounter  Procedures   Large Joint Inj: bilateral knee   Meds ordered this encounter  Medications   traMADol (ULTRAM) 50 MG tablet    Sig: Take 1-2 tablets (50-100 mg total) by mouth every 12 (twelve) hours as needed.    Dispense:  30 tablet    Refill:  1      Procedures: Large Joint Inj: bilateral knee on 05/22/2023 9:07 AM Indications: pain Details: 22 G needle, anterolateral approach Medications (Right): 0.66 mL bupivacaine 0.25 %; 3 mL lidocaine 1 %; 13.33 mg methylPREDNISolone acetate 40 MG/ML Medications (Left): 0.66 mL bupivacaine 0.25 %; 3 mL lidocaine 1 %; 13.33 mg methylPREDNISolone acetate 40 MG/ML      Clinical Data: No additional findings.   Subjective: Chief Complaint  Patient presents with   Right Knee - Pain    HPI patient is a pleasant 43 year old female who comes in today with recurrent bilateral knee pain right greater than left.  She is having pain to the entire aspect of both knees.  Symptoms are worse when she is standing as well as stair climbing and then again at night when she is trying to sleep.  She has associated swelling and giving way sensation on the left.  She has been using heating pads and taking ibuprofen  and Tylenol without significant relief.  She did undergo cortisone injection to both knees on 01/16/2023 with relief for about 2 months.  Review of Systems as detailed in HPI.  All others reviewed and are negative.   Objective: Vital Signs: LMP  (LMP Unknown)   Physical Exam well-developed well-nourished female no acute distress.  Alert and oriented x 3.  Ortho Exam bilateral knee exam shows small effusion.  Range of motion 0 to 95 degrees.  Medial joint line tenderness.  She is neurovascularly intact distally.  Specialty Comments:  No specialty comments available.  Imaging: No new imaging   PMFS History: Patient Active Problem List   Diagnosis Date Noted   Bilateral primary osteoarthritis of knee 02/28/2020   Precordial chest pain 05/03/2019   Educated about COVID-19 virus infection 05/03/2019   Hoarseness of voice 03/17/2018   Gastroesophageal reflux disease without esophagitis 03/17/2018   Supervision of normal pregnancy, antepartum 12/15/2017   Acute medial meniscus tear of right knee 02/10/2017   Vitamin D deficiency 07/10/2015   Anxiety 06/20/2015   Costochondritis 05/18/2015   Primary osteoarthritis of right knee 04/22/2014   Essential hypertension 07/18/2012   Sprain of ligament of lumbosacral joint 07/05/2008   Morbid obesity (HCC) 02/24/2008   Asthma with exacerbation 02/24/2008   Past Medical History:  Diagnosis  Date   Asthma    2010-only bothers pt during bad cold   Depression    2010-had when mother passed away   History of gonorrhea    Hypertension    2004   Obesity    Pre-diabetes    Preeclampsia complicating hypertension 01/21/2015    Family History  Problem Relation Age of Onset   Breast cancer Mother    Hypertension Mother    Diabetes Mother    Cancer Mother        Breast    Heart attack Mother 35   Hypertension Father    Diabetes Father    Hyperlipidemia Father    Arthritis Other    Asthma Other    Breast cancer Other    Heart  failure Other    Congenital heart disease Other    Depression Other    Heart attack Other     Past Surgical History:  Procedure Laterality Date   CHOLECYSTECTOMY     DILATION AND CURETTAGE OF UTERUS     WISDOM TOOTH EXTRACTION     Social History   Occupational History   Not on file  Tobacco Use   Smoking status: Former    Current packs/day: 0.00    Types: Cigarettes    Quit date: 10/31/2010    Years since quitting: 12.5   Smokeless tobacco: Never  Vaping Use   Vaping status: Never Used  Substance and Sexual Activity   Alcohol use: No    Alcohol/week: 0.0 standard drinks of alcohol   Drug use: No   Sexual activity: Not Currently    Partners: Male    Birth control/protection: Injection, None

## 2023-05-29 ENCOUNTER — Other Ambulatory Visit: Payer: Self-pay

## 2023-05-29 MED FILL — Metformin HCl Tab 500 MG: ORAL | 30 days supply | Qty: 120 | Fill #1 | Status: AC

## 2023-06-03 ENCOUNTER — Encounter (INDEPENDENT_AMBULATORY_CARE_PROVIDER_SITE_OTHER): Payer: Self-pay

## 2023-06-12 ENCOUNTER — Ambulatory Visit
Admission: RE | Admit: 2023-06-12 | Discharge: 2023-06-12 | Disposition: A | Discharge status: Home / Self Care | Payer: Medicaid Other | Source: Ambulatory Visit | Attending: Family Medicine | Admitting: Family Medicine

## 2023-06-12 DIAGNOSIS — Z1231 Encounter for screening mammogram for malignant neoplasm of breast: Secondary | ICD-10-CM

## 2023-06-18 ENCOUNTER — Encounter: Payer: Self-pay | Admitting: Family Medicine

## 2023-07-15 ENCOUNTER — Ambulatory Visit: Admitting: Physician Assistant

## 2023-07-31 ENCOUNTER — Encounter: Payer: Self-pay | Admitting: Physician Assistant

## 2023-07-31 ENCOUNTER — Telehealth: Payer: Self-pay

## 2023-07-31 ENCOUNTER — Other Ambulatory Visit: Payer: Self-pay | Admitting: Family Medicine

## 2023-07-31 ENCOUNTER — Ambulatory Visit (INDEPENDENT_AMBULATORY_CARE_PROVIDER_SITE_OTHER): Admitting: Physician Assistant

## 2023-07-31 ENCOUNTER — Other Ambulatory Visit: Payer: Self-pay

## 2023-07-31 DIAGNOSIS — M1711 Unilateral primary osteoarthritis, right knee: Secondary | ICD-10-CM | POA: Diagnosis not present

## 2023-07-31 DIAGNOSIS — M1712 Unilateral primary osteoarthritis, left knee: Secondary | ICD-10-CM

## 2023-07-31 DIAGNOSIS — I10 Essential (primary) hypertension: Secondary | ICD-10-CM

## 2023-07-31 MED ORDER — ACETAMINOPHEN-CODEINE 300-30 MG PO TABS: 1.0000 | ORAL_TABLET | Freq: Two times a day (BID) | ORAL | 1 refills | Status: DC | PRN

## 2023-07-31 MED ORDER — LOSARTAN POTASSIUM 50 MG PO TABS
50.0000 mg | ORAL_TABLET | Freq: Every day | ORAL | 0 refills | Status: DC
  Filled 2023-07-31: qty 30, 30d supply, fill #0

## 2023-07-31 MED FILL — Metformin HCl Tab 500 MG: ORAL | 30 days supply | Qty: 120 | Fill #2 | Status: AC

## 2023-07-31 NOTE — Telephone Encounter (Signed)
 Please precert for bilateral visco.

## 2023-07-31 NOTE — Progress Notes (Signed)
 Office Visit Note   Patient: Crystal Burns           Date of Birth: [birth year]           MRN: [id number] Visit Date: 07/31/2023              Requested by: Joaquin Mulberry, MD [address] [place] [address] [place],  [place] [address] PCP: Joaquin Mulberry, MD   Assessment & Plan: Visit Diagnoses:  1. Primary osteoarthritis of right knee   2. Unilateral primary osteoarthritis, left knee     Plan: Impression is advanced bilateral knee osteoarthritis.  We have again discussed various treatment options to include cortisone injection versus viscosupplementation injection versus total knee replacement surgery.  Unfortunately, cortisone injections provide only temporary relief.  Will submit approval for bilateral knee viscosupplementation injections.  Follow-up once approved.  We have also discussed making all efforts at weight loss  Follow-Up Instructions: Return for once approved for visco inj.   Orders:  No orders of the defined types were placed in this encounter.  Meds ordered this encounter  Medications   acetaminophen -codeine  (TYLENOL  #3) 300-30 MG tablet    Sig: Take 1-2 tablets by mouth 2 (two) times daily as needed.    Dispense:  30 tablet    Refill:  1      Procedures: No procedures performed   Clinical Data: No additional findings.   Subjective: Chief Complaint  Patient presents with   Right Knee - Pain    HPI patient is a pleasant 43 year old female who comes in today with continued bilateral knee pain right greater than left.  History of advanced osteoarthritis to both knees which worsened after a fall in [employer] in the fall 2024.  She was seen by us  about 2 months ago where cortisone injections were performed in both knees.  She did have decent relief but unfortunately this only lasted for a couple of weeks.  She continues to have constant pain worse with standing and walking.  She has associated swelling.  She has taken 2 tramadol  at a time without  relief.  Review of Systems as detailed in HPI.  All others reviewed and are negative.   Objective: Vital Signs: There were no vitals taken for this visit.  Physical Exam well-developed well-nourished female in no acute distress.  Alert and oriented x 3.  Ortho Exam unchanged bilateral knee exam  Specialty Comments:  No specialty comments available.  Imaging: No new imaging   PMFS History: Patient Active Problem List   Diagnosis Date Noted   Bilateral primary osteoarthritis of knee 02/28/2020   Precordial chest pain 05/03/2019   Educated about COVID-19 virus infection 05/03/2019   Hoarseness of voice 03/17/2018   Gastroesophageal reflux disease without esophagitis 03/17/2018   Supervision of normal pregnancy, antepartum 12/15/2017   Acute medial meniscus tear of right knee 02/10/2017   Vitamin D  deficiency 07/10/2015   Anxiety 06/20/2015   Costochondritis 05/18/2015   Primary osteoarthritis of right knee 04/22/2014   Essential hypertension 07/18/2012   Sprain of ligament of lumbosacral joint 07/05/2008   Morbid obesity (HCC) 02/24/2008   Asthma with exacerbation 02/24/2008   Past Medical History:  Diagnosis Date   Asthma    2010-only bothers pt during bad cold   Depression    2010-had when mother passed away   History of gonorrhea    Hypertension    2004   Obesity    Pre-diabetes    Preeclampsia complicating hypertension 01/21/2015  Family History  Problem Relation Age of Onset   Breast cancer Mother    Hypertension Mother    Diabetes Mother    Cancer Mother        Breast    Heart attack Mother 25   Hypertension Father    Diabetes Father    Hyperlipidemia Father    Arthritis Other    Asthma Other    Breast cancer Other    Heart failure Other    Congenital heart disease Other    Depression Other    Heart attack Other     Past Surgical History:  Procedure Laterality Date   CHOLECYSTECTOMY     DILATION AND CURETTAGE OF UTERUS     WISDOM TOOTH  EXTRACTION     Social History   Occupational History   Not on file  Tobacco Use   Smoking status: Former    Current packs/day: 0.00    Types: Cigarettes    Quit date: 10/31/2010    Years since quitting: 12.7   Smokeless tobacco: Never  Vaping Use   Vaping status: Never Used  Substance and Sexual Activity   Alcohol use: No    Alcohol/week: 0.0 standard drinks of alcohol   Drug use: No   Sexual activity: Not Currently    Partners: Male    Birth control/protection: Injection, None

## 2023-07-31 NOTE — Addendum Note (Signed)
 Addended by: Yona Stansbury on: 07/31/2023 11:04 AM   Modules accepted: Orders

## 2023-08-01 ENCOUNTER — Other Ambulatory Visit: Payer: Self-pay

## 2023-08-06 ENCOUNTER — Encounter: Payer: Medicaid Other | Admitting: Family Medicine

## 2023-08-06 ENCOUNTER — Telehealth: Payer: Self-pay | Admitting: Physician Assistant

## 2023-08-06 NOTE — Telephone Encounter (Signed)
 Patient called and said that the medication was sent to the wrong pharmacy. It needs to go to the [facility] on [address].  The insurance needed authorization from the doctor. [phone]

## 2023-08-07 ENCOUNTER — Other Ambulatory Visit: Payer: Self-pay | Admitting: Physician Assistant

## 2023-08-07 MED ORDER — ACETAMINOPHEN-CODEINE 300-30 MG PO TABS: 1.0000 | ORAL_TABLET | Freq: Two times a day (BID) | ORAL | 1 refills | Status: DC | PRN

## 2023-08-07 NOTE — Telephone Encounter (Signed)
 sent

## 2023-08-13 ENCOUNTER — Encounter: Admitting: Family Medicine

## 2023-08-13 NOTE — Telephone Encounter (Signed)
 Talked with patient and advised her that Medicaid does not cover any gel injections.  Advised of TriVisc, but patient declined.  Patient voiced that she understands.  Appt.scheduled for cortisone injections.

## 2023-08-18 ENCOUNTER — Encounter: Payer: Self-pay | Admitting: Internal Medicine

## 2023-08-18 ENCOUNTER — Ambulatory Visit: Attending: Internal Medicine | Admitting: Internal Medicine

## 2023-08-18 VITALS — BP 125/86 | Temp 98.2°F | Resp 16 | Ht 63.0 in | Wt 321.0 lb

## 2023-08-18 DIAGNOSIS — N6324 Unspecified lump in the left breast, lower inner quadrant: Secondary | ICD-10-CM | POA: Diagnosis not present

## 2023-08-18 NOTE — Patient Instructions (Signed)
 You will be called by [facility] to get mammogram and possibly ultra sound of the left breast to evaluate the new nodule/mass.

## 2023-08-18 NOTE — Progress Notes (Signed)
 Patient ID: Crystal Burns, female    DOB: 02-26-1981  MRN: [id number]  CC: Breast Problem (Bruise and lump on left breast /Mammogram 2 mos ago. )   Subjective: Crystal Burns is a 43 y.o. female who presents for UC visit.  PCP is Dr. Adan Holms Her concerns today include:   Had normal MMG 2 mths ago. Yesterday she felt a knot in LT breast and noticed a bruise over the area. No pain.  No drainage from nipple Mother died with breast CA.  Dx at age 14.  Great grand mother on mother's side also had breast CA.  Does not know if mom was checked for BRCA gene.  Patient Active Problem List   Diagnosis Date Noted   Bilateral primary osteoarthritis of knee 02/28/2020   Precordial chest pain 05/03/2019   Educated about COVID-19 virus infection 05/03/2019   Hoarseness of voice 03/17/2018   Gastroesophageal reflux disease without esophagitis 03/17/2018   Supervision of normal pregnancy, antepartum 12/15/2017   Acute medial meniscus tear of right knee 02/10/2017   Vitamin D  deficiency 07/10/2015   Anxiety 06/20/2015   Costochondritis 05/18/2015   Primary osteoarthritis of right knee 04/22/2014   Essential hypertension 07/18/2012   Sprain of ligament of lumbosacral joint 07/05/2008   Morbid obesity (HCC) 02/24/2008   Asthma with exacerbation 02/24/2008     Current Outpatient Medications on File Prior to Visit  Medication Sig Dispense Refill   acetaminophen -codeine  (TYLENOL  #3) 300-30 MG tablet Take 1-2 tablets by mouth 2 (two) times daily as needed. 30 tablet 1   albuterol  (PROVENTIL ) (2.5 MG/3ML) 0.083% nebulizer solution Take 3 mLs (2.5 mg total) by nebulization every 6 (six) hours as needed for wheezing or shortness of breath. 75 mL 6   albuterol  (PROVENTIL ) (2.5 MG/3ML) 0.083% nebulizer solution Take 3 mLs (2.5 mg total) by nebulization every 4 (four) hours as needed for wheezing or shortness of breath. 225 mL 0   albuterol  (VENTOLIN  HFA) 108 (90 Base) MCG/ACT inhaler Inhale 2  puffs into the lungs every 4 (four) hours as needed for wheezing or shortness of breath (Cough). 18 g 0   fluticasone  (FLONASE ) 50 MCG/ACT nasal spray Place 1 spray into both nostrils daily. 16 g 0   hydrochlorothiazide  (HYDRODIURIL ) 25 MG tablet Take 1 tablet (25 mg total) by mouth daily. 90 tablet 1   Iron , Ferrous Sulfate , 325 (65 Fe) MG TABS Take 1 tablet by mouth daily. 60 tablet 3   losartan  (COZAAR ) 50 MG tablet Take 1 tablet (50 mg total) by mouth daily. 30 tablet 0   metFORMIN  (GLUCOPHAGE ) 500 MG tablet Take 2 tablets (1,000 mg total) by mouth 2 (two) times daily with a meal. 120 tablet 6   pravastatin  (PRAVACHOL ) 20 MG tablet Take 1 tablet (20 mg total) by mouth daily. 90 tablet 1   Spacer/Aero-Holding Chambers (AEROCHAMBER MV) inhaler Use as instructed 1 each 1   traMADol  (ULTRAM ) 50 MG tablet Take 1-2 tablets (50-100 mg total) by mouth every 12 (twelve) hours as needed. 30 tablet 1   No current facility-administered medications on file prior to visit.    Allergies  Allergen Reactions   Bee Venom Hives and Swelling   Lisinopril  Shortness Of Breath and Cough   Shellfish Allergy Anaphylaxis    Social History   Socioeconomic History   Marital status: Single    Spouse name: Not on file   Number of children: Not on file   Years of education: Not on file   Highest  education level: Not on file  Occupational History   Not on file  Tobacco Use   Smoking status: Former    Current packs/day: 0.00    Types: Cigarettes    Quit date: 10/31/2010    Years since quitting: 12.8   Smokeless tobacco: Never  Vaping Use   Vaping status: Never Used  Substance and Sexual Activity   Alcohol use: No    Alcohol/week: 0.0 standard drinks of alcohol   Drug use: No   Sexual activity: Not Currently    Partners: Male    Birth control/protection: Injection, None  Other Topics Concern   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      Social Drivers of Corporate investment banker Strain: Low Risk  (02/06/2023)   Overall Financial Resource Strain (CARDIA)    Difficulty of Paying Living Expenses: Not hard at all  Food Insecurity: No Food Insecurity (02/06/2023)   Hunger Vital Sign    Worried About Running Out of Food in the Last Year: Never true    Ran Out of Food in the Last Year: Never true  Transportation Needs: No Transportation Needs (02/06/2023)   PRAPARE - Administrator, Civil Service (Medical): No    Lack of Transportation (Non-Medical): No  Physical Activity: Inactive (02/06/2023)   Exercise Vital Sign    Days of Exercise per Week: 3 days    Minutes of Exercise per Session: 0 min  Stress: No Stress Concern Present (02/06/2023)   [employer] of Occupational Health - Occupational Stress Questionnaire    Feeling of Stress : Not at all  Social Connections: Moderately Integrated (02/06/2023)   Social Connection and Isolation Panel [NHANES]    Frequency of Communication with Friends and Family: More than three times a week    Frequency of Social Gatherings with Friends and Family: More than three times a week    Attends Religious Services: 1 to 4 times per year    Active Member of [employer] or Organizations: Yes    Attends Banker Meetings: 1 to 4 times per year    Marital Status: Never married  Intimate Partner Violence: Not At Risk (02/06/2023)   Humiliation, Afraid, Rape, and Kick questionnaire    Fear of Current or Ex-Partner: No    Emotionally Abused: No    Physically Abused: No    Sexually Abused: No    Family History  Problem Relation Age of Onset   Breast cancer Mother    Hypertension Mother    Diabetes Mother    Cancer Mother        Breast    Heart attack Mother 80   Hypertension Father    Diabetes Father    Hyperlipidemia Father    Arthritis Other    Asthma Other    Breast cancer Other    Heart failure Other    Congenital heart disease Other     Depression Other    Heart attack Other     Past Surgical History:  Procedure Laterality Date   CHOLECYSTECTOMY     DILATION AND CURETTAGE OF UTERUS     WISDOM TOOTH EXTRACTION      ROS: Review of Systems Negative except as stated above  PHYSICAL EXAM: BP 125/86 (BP Location: Left Arm, Patient Position: Sitting, Cuff Size: Large)   Temp 98.2 F (36.8 C) (Oral)   Resp 16   Ht 5\' 3"  (  1.6 m)   Wt (!) 321 lb (145.6 kg)   LMP 08/04/2023 (Exact Date)   SpO2 97%   BMI 56.86 kg/m   Physical Exam  General appearance - alert, well appearing, and in no distress Mental status - pt is very nervous/anxious regarding the lump in her breast.  She repeatedly ask if I can tell what it is  Breasts -RN Sherrlyn Dolores a Amye Baller present: Patient with pendulous large breasts.  She has dark/hyperpigmented area in the left lower inner quadrant.  2 x 2.5 cm movable mass felt in this left lower inner quadrant.  It is nontender to touch.  No discharge from the nipple.  No masses felt in the right breast or in the axillary regions      Latest Ref Rng & Units 01/13/2023    6:30 PM 11/19/2022    1:36 PM 10/30/2021   10:09 AM  CMP  Glucose 70 - 99 mg/dL 161  78  096   BUN 6 - 20 mg/dL 12  10  16    Creatinine 0.44 - 1.00 mg/dL 0.45  4.09  8.11   Sodium 135 - 145 mmol/L 140  138  140   Potassium 3.5 - 5.1 mmol/L 3.9  4.5  4.0   Chloride 98 - 111 mmol/L 104  100  102   CO2 22 - 32 mmol/L 27  27  24    Calcium  8.9 - 10.3 mg/dL 9.4  9.5  9.8   Total Protein 6.0 - 8.5 g/dL   6.6   Total Bilirubin 0.0 - 1.2 mg/dL   <9.1   Alkaline Phos 44 - 121 IU/L   86   AST 0 - 40 IU/L   8   ALT 0 - 32 IU/L   16    Lipid Panel     Component Value Date/Time   CHOL 194 10/30/2021 1009   TRIG 108 10/30/2021 1009   HDL 34 (L) 10/30/2021 1009   CHOLHDL 5.1 (H) 07/23/2016 1508   LDLCALC 140 (H) 10/30/2021 1009    CBC    Component Value Date/Time   WBC 9.8 01/13/2023 1830   RBC 4.15 01/13/2023 1830   HGB 10.1 (L)  01/13/2023 1830   HGB 10.6 (L) 10/30/2021 1009   HCT 33.1 (L) 01/13/2023 1830   HCT 34.6 10/30/2021 1009   PLT 342 01/13/2023 1830   PLT 387 10/30/2021 1009   MCV 79.8 (L) 01/13/2023 1830   MCV 78 (L) 10/30/2021 1009   MCH 24.3 (L) 01/13/2023 1830   MCHC 30.5 01/13/2023 1830   RDW 15.6 (H) 01/13/2023 1830   RDW 15.2 10/30/2021 1009   LYMPHSABS 2.7 10/30/2021 1009   MONOABS 0.4 11/11/2017 1616   EOSABS 0.3 10/30/2021 1009   BASOSABS 0.1 10/30/2021 1009    ASSESSMENT AND PLAN: 1. Mass of lower inner quadrant of left breast (Primary) -Advised patient that based on exam, I cannot tell her whether this is a cancerous mass or not.  However I cannot say that she is at high risk given family history of breast cancer in her mother who was diagnosed in her late 35s.  Mammogram done 2 months ago was normal but we will need to do a repeat mammogram.  Advised that the radiologist would give recommendations about whether this needs to be biopsied based on the imaging study. Also advised that she consider in the near future genetic counseling and screening for the BRCA gene. All questions were answered.  Patient remained quite anxious.  Encouraged  her to try not to worry excessively.  For now we need to proceed with repeat imaging and then further management will be based on the results of that imaging. - MM 3D DIAGNOSTIC MAMMOGRAM UNILATERAL LEFT BREAST; Future   Patient was given the opportunity to ask questions.  Patient verbalized understanding of the plan and was able to repeat key elements of the plan.   This documentation was completed using Paediatric nurse.  Any transcriptional errors are unintentional.  No orders of the defined types were placed in this encounter.    Requested Prescriptions    No prescriptions requested or ordered in this encounter    No follow-ups on file.  Concetta Dee, MD, FACP

## 2023-08-19 ENCOUNTER — Encounter: Payer: Self-pay | Admitting: Physician Assistant

## 2023-08-19 ENCOUNTER — Ambulatory Visit: Admitting: Physician Assistant

## 2023-08-19 ENCOUNTER — Other Ambulatory Visit: Payer: Self-pay | Admitting: Internal Medicine

## 2023-08-19 DIAGNOSIS — M170 Bilateral primary osteoarthritis of knee: Secondary | ICD-10-CM | POA: Diagnosis not present

## 2023-08-19 DIAGNOSIS — N6324 Unspecified lump in the left breast, lower inner quadrant: Secondary | ICD-10-CM

## 2023-08-19 MED ORDER — METHYLPREDNISOLONE ACETATE 40 MG/ML IJ SUSP
13.3300 mg | INTRAMUSCULAR | Status: AC | PRN
  Administered 2023-08-19: 13.33 mg via INTRA_ARTICULAR

## 2023-08-19 MED ORDER — BUPIVACAINE HCL 0.25 % IJ SOLN
0.6600 mL | INTRAMUSCULAR | Status: AC | PRN
  Administered 2023-08-19: .66 mL via INTRA_ARTICULAR

## 2023-08-19 MED ORDER — LIDOCAINE HCL 1 % IJ SOLN
3.0000 mL | INTRAMUSCULAR | Status: AC | PRN
  Administered 2023-08-19: 3 mL

## 2023-08-19 NOTE — Progress Notes (Signed)
 Office Visit Note   Patient: Crystal Burns           Date of Birth: 1980/10/05           MRN: [id number] Visit Date: 08/19/2023              Requested by: Joaquin Mulberry, MD [address] [place],  [place] [address] PCP: Joaquin Mulberry, MD   Assessment & Plan: Visit Diagnoses:  1. Bilateral primary osteoarthritis of knee     Plan: Impression is bilateral knee osteoarthritis with aggravation from a mechanical fall in the fall 2024.  We have again discussed various treatment options and she would like to proceed with cortisone injections today.  She will continue working at weight loss.  She will follow-up with us  as needed.  Follow-Up Instructions: Return if symptoms worsen or fail to improve.   Orders:  Orders Placed This Encounter  Procedures   Large Joint Inj: bilateral knee   No orders of the defined types were placed in this encounter.     Procedures: Large Joint Inj: bilateral knee on 08/19/2023 3:53 PM Indications: pain Details: 22 G needle, anterolateral approach Medications (Right): 0.66 mL bupivacaine  0.25 %; 3 mL lidocaine  1 %; 13.33 mg methylPREDNISolone  acetate 40 MG/ML Medications (Left): 0.66 mL bupivacaine  0.25 %; 3 mL lidocaine  1 %; 13.33 mg methylPREDNISolone  acetate 40 MG/ML      Clinical Data: No additional findings.   Subjective: Chief Complaint  Patient presents with   Right Knee - Pain   Left Knee - Pain    HPI patient is a pleasant 43 year old female who comes in today with recurrent bilateral knee pain.  History of osteoarthritis to both knees which was aggravated after a fall in [employer] back in the fall 2024.  She has been seen in our office where she is undergoing multiple rounds of cortisone injections.  Unfortunately, these only provide minimal relief in symptoms.  We tried to get approval for viscosupplementation injection but was not approved.  She declined pain for this out-of-pocket.  The current pain she is  having is to the entire aspect of both knees.  Pain is constant but worse when she is standing for a long time.  She has been taking tramadol  Tylenol  3 without significant relief.  Recently referred to pain management and has an upcoming appointment at the end of the month.  She has also been seen by weight loss clinic and has been working on trying to lose weight.  Review of Systems as detailed in HPI.  All others reviewed and are negative.   Objective: Vital Signs: LMP 08/04/2023 (Exact Date)   Physical Exam well-developed well-nourished female in no acute distress.  Alert and oriented x 3.  Ortho Exam bilateral knee exam is unchanged  Specialty Comments:  No specialty comments available.  Imaging: No new imaging   PMFS History: Patient Active Problem List   Diagnosis Date Noted   Bilateral primary osteoarthritis of knee 02/28/2020   Precordial chest pain 05/03/2019   Educated about COVID-19 virus infection 05/03/2019   Hoarseness of voice 03/17/2018   Gastroesophageal reflux disease without esophagitis 03/17/2018   Supervision of normal pregnancy, antepartum 12/15/2017   Acute medial meniscus tear of right knee 02/10/2017   Vitamin D  deficiency 07/10/2015   Anxiety 06/20/2015   Costochondritis 05/18/2015   Primary osteoarthritis of right knee 04/22/2014   Essential hypertension 07/18/2012   Sprain of ligament of lumbosacral joint 07/05/2008   Morbid  obesity (HCC) 02/24/2008   Asthma with exacerbation 02/24/2008   Past Medical History:  Diagnosis Date   Asthma    2010-only bothers pt during bad cold   Depression    2010-had when mother passed away   History of gonorrhea    Hypertension    2004   Obesity    Pre-diabetes    Preeclampsia complicating hypertension 01/21/2015    Family History  Problem Relation Age of Onset   Breast cancer Mother    Hypertension Mother    Diabetes Mother    Cancer Mother        Breast    Heart attack Mother 14   Hypertension  Father    Diabetes Father    Hyperlipidemia Father    Arthritis Other    Asthma Other    Breast cancer Other    Heart failure Other    Congenital heart disease Other    Depression Other    Heart attack Other     Past Surgical History:  Procedure Laterality Date   CHOLECYSTECTOMY     DILATION AND CURETTAGE OF UTERUS     WISDOM TOOTH EXTRACTION     Social History   Occupational History   Not on file  Tobacco Use   Smoking status: Former    Current packs/day: 0.00    Types: Cigarettes    Quit date: 10/31/2010    Years since quitting: 12.8   Smokeless tobacco: Never  Vaping Use   Vaping status: Never Used  Substance and Sexual Activity   Alcohol use: No    Alcohol/week: 0.0 standard drinks of alcohol   Drug use: No   Sexual activity: Not Currently    Partners: Male    Birth control/protection: Injection, None

## 2023-09-01 DIAGNOSIS — N914 Secondary oligomenorrhea: Secondary | ICD-10-CM | POA: Diagnosis not present

## 2023-09-01 DIAGNOSIS — Z79899 Other long term (current) drug therapy: Secondary | ICD-10-CM | POA: Diagnosis not present

## 2023-09-01 DIAGNOSIS — G8929 Other chronic pain: Secondary | ICD-10-CM | POA: Diagnosis not present

## 2023-09-01 DIAGNOSIS — E559 Vitamin D deficiency, unspecified: Secondary | ICD-10-CM | POA: Diagnosis not present

## 2023-09-01 DIAGNOSIS — Z6841 Body Mass Index (BMI) 40.0 and over, adult: Secondary | ICD-10-CM | POA: Diagnosis not present

## 2023-09-01 DIAGNOSIS — R7303 Prediabetes: Secondary | ICD-10-CM | POA: Diagnosis not present

## 2023-09-01 DIAGNOSIS — M129 Arthropathy, unspecified: Secondary | ICD-10-CM | POA: Diagnosis not present

## 2023-09-01 DIAGNOSIS — M25561 Pain in right knee: Secondary | ICD-10-CM | POA: Diagnosis not present

## 2023-09-01 DIAGNOSIS — Z1159 Encounter for screening for other viral diseases: Secondary | ICD-10-CM | POA: Diagnosis not present

## 2023-09-01 DIAGNOSIS — M25562 Pain in left knee: Secondary | ICD-10-CM | POA: Diagnosis not present

## 2023-09-02 ENCOUNTER — Other Ambulatory Visit: Payer: Self-pay | Admitting: Family Medicine

## 2023-09-02 ENCOUNTER — Other Ambulatory Visit: Payer: Self-pay

## 2023-09-02 DIAGNOSIS — I10 Essential (primary) hypertension: Secondary | ICD-10-CM

## 2023-09-02 MED ORDER — LOSARTAN POTASSIUM 50 MG PO TABS
50.0000 mg | ORAL_TABLET | Freq: Every day | ORAL | 1 refills | Status: DC
  Filled 2023-09-02: qty 90, 90d supply, fill #0
  Filled 2023-12-02: qty 90, 90d supply, fill #1

## 2023-09-02 MED ORDER — HYDROCHLOROTHIAZIDE 25 MG PO TABS
25.0000 mg | ORAL_TABLET | Freq: Every day | ORAL | 1 refills | Status: DC
  Filled 2023-09-02: qty 90, 90d supply, fill #0
  Filled 2023-12-02: qty 90, 90d supply, fill #1

## 2023-09-03 ENCOUNTER — Other Ambulatory Visit: Payer: Self-pay

## 2023-09-03 DIAGNOSIS — Z79899 Other long term (current) drug therapy: Secondary | ICD-10-CM | POA: Diagnosis not present

## 2023-09-05 ENCOUNTER — Ambulatory Visit
Admission: RE | Admit: 2023-09-05 | Discharge: 2023-09-05 | Disposition: A | Discharge status: Home / Self Care | Source: Ambulatory Visit | Attending: Internal Medicine | Admitting: Internal Medicine

## 2023-09-05 ENCOUNTER — Other Ambulatory Visit: Payer: Self-pay | Admitting: Internal Medicine

## 2023-09-05 DIAGNOSIS — N6324 Unspecified lump in the left breast, lower inner quadrant: Secondary | ICD-10-CM

## 2023-09-05 DIAGNOSIS — M7989 Other specified soft tissue disorders: Secondary | ICD-10-CM

## 2023-09-05 DIAGNOSIS — N6322 Unspecified lump in the left breast, upper inner quadrant: Secondary | ICD-10-CM | POA: Diagnosis not present

## 2023-09-06 ENCOUNTER — Ambulatory Visit: Payer: Self-pay | Admitting: Internal Medicine

## 2023-09-15 ENCOUNTER — Telehealth: Payer: Self-pay | Admitting: Physician Assistant

## 2023-09-15 NOTE — Telephone Encounter (Signed)
Ready for pick up at the front desk. Patient aware.   

## 2023-09-15 NOTE — Telephone Encounter (Signed)
 We can do it for another 3 months at most.  Thanks.

## 2023-09-15 NOTE — Telephone Encounter (Signed)
 See message below. Is this okay. If yes, for how long?

## 2023-09-15 NOTE — Telephone Encounter (Signed)
 Note printed and sent to Mychart per patients request.

## 2023-09-15 NOTE — Telephone Encounter (Signed)
 Pt states the work accomodation she was given on 03/14/23 has expired and she is needing a updated work note to say the exact same thing. Pt states she still having severe pain and needs these accommodations until she can get the knee surgery done.

## 2023-09-18 DIAGNOSIS — E119 Type 2 diabetes mellitus without complications: Secondary | ICD-10-CM | POA: Diagnosis not present

## 2023-09-22 DIAGNOSIS — M25561 Pain in right knee: Secondary | ICD-10-CM | POA: Diagnosis not present

## 2023-09-22 DIAGNOSIS — Z79899 Other long term (current) drug therapy: Secondary | ICD-10-CM | POA: Diagnosis not present

## 2023-09-22 DIAGNOSIS — Z6841 Body Mass Index (BMI) 40.0 and over, adult: Secondary | ICD-10-CM | POA: Diagnosis not present

## 2023-09-22 DIAGNOSIS — E119 Type 2 diabetes mellitus without complications: Secondary | ICD-10-CM | POA: Diagnosis not present

## 2023-09-22 DIAGNOSIS — M25562 Pain in left knee: Secondary | ICD-10-CM | POA: Diagnosis not present

## 2023-09-22 DIAGNOSIS — E559 Vitamin D deficiency, unspecified: Secondary | ICD-10-CM | POA: Diagnosis not present

## 2023-09-22 DIAGNOSIS — N914 Secondary oligomenorrhea: Secondary | ICD-10-CM | POA: Diagnosis not present

## 2023-09-22 DIAGNOSIS — G8929 Other chronic pain: Secondary | ICD-10-CM | POA: Diagnosis not present

## 2023-09-24 DIAGNOSIS — Z79899 Other long term (current) drug therapy: Secondary | ICD-10-CM | POA: Diagnosis not present

## 2023-10-08 ENCOUNTER — Other Ambulatory Visit: Payer: Self-pay | Admitting: Family Medicine

## 2023-10-08 ENCOUNTER — Other Ambulatory Visit: Payer: Self-pay

## 2023-10-08 MED ORDER — PRAVASTATIN SODIUM 20 MG PO TABS
20.0000 mg | ORAL_TABLET | Freq: Every day | ORAL | 1 refills | Status: AC
  Filled 2023-10-08 – 2023-10-27 (×2): qty 90, 90d supply, fill #0
  Filled 2024-02-20: qty 90, 90d supply, fill #1

## 2023-10-08 MED FILL — Metformin HCl Tab 500 MG: ORAL | 30 days supply | Qty: 120 | Fill #3 | Status: AC

## 2023-10-09 ENCOUNTER — Other Ambulatory Visit: Payer: Self-pay

## 2023-10-20 ENCOUNTER — Other Ambulatory Visit: Payer: Self-pay

## 2023-10-22 ENCOUNTER — Other Ambulatory Visit: Payer: Self-pay | Admitting: Physician Assistant

## 2023-10-22 ENCOUNTER — Telehealth: Payer: Self-pay | Admitting: Physician Assistant

## 2023-10-22 MED ORDER — ACETAMINOPHEN-CODEINE 300-30 MG PO TABS: 1.0000 | ORAL_TABLET | Freq: Two times a day (BID) | ORAL | 0 refills | Status: DC | PRN

## 2023-10-22 NOTE — Telephone Encounter (Signed)
 Refilled with different instructions

## 2023-10-22 NOTE — Telephone Encounter (Signed)
 Called and notified patient.

## 2023-10-22 NOTE — Telephone Encounter (Signed)
 Patient called and ask if she could get a refill on Tylenol  3 for her pain. [phone]

## 2023-10-27 ENCOUNTER — Other Ambulatory Visit: Payer: Self-pay

## 2023-10-30 ENCOUNTER — Other Ambulatory Visit: Payer: Self-pay

## 2023-11-04 DIAGNOSIS — Z6841 Body Mass Index (BMI) 40.0 and over, adult: Secondary | ICD-10-CM | POA: Diagnosis not present

## 2023-11-04 DIAGNOSIS — E119 Type 2 diabetes mellitus without complications: Secondary | ICD-10-CM | POA: Diagnosis not present

## 2023-11-04 DIAGNOSIS — M25562 Pain in left knee: Secondary | ICD-10-CM | POA: Diagnosis not present

## 2023-11-04 DIAGNOSIS — E559 Vitamin D deficiency, unspecified: Secondary | ICD-10-CM | POA: Diagnosis not present

## 2023-11-04 DIAGNOSIS — Z79899 Other long term (current) drug therapy: Secondary | ICD-10-CM | POA: Diagnosis not present

## 2023-11-04 DIAGNOSIS — M25561 Pain in right knee: Secondary | ICD-10-CM | POA: Diagnosis not present

## 2023-11-04 DIAGNOSIS — N914 Secondary oligomenorrhea: Secondary | ICD-10-CM | POA: Diagnosis not present

## 2023-11-04 DIAGNOSIS — G8929 Other chronic pain: Secondary | ICD-10-CM | POA: Diagnosis not present

## 2023-11-04 DIAGNOSIS — I10 Essential (primary) hypertension: Secondary | ICD-10-CM | POA: Diagnosis not present

## 2023-11-06 DIAGNOSIS — Z79899 Other long term (current) drug therapy: Secondary | ICD-10-CM | POA: Diagnosis not present

## 2023-11-26 DIAGNOSIS — Z6841 Body Mass Index (BMI) 40.0 and over, adult: Secondary | ICD-10-CM | POA: Diagnosis not present

## 2023-11-26 DIAGNOSIS — Z1159 Encounter for screening for other viral diseases: Secondary | ICD-10-CM | POA: Diagnosis not present

## 2023-11-26 DIAGNOSIS — D539 Nutritional anemia, unspecified: Secondary | ICD-10-CM | POA: Diagnosis not present

## 2023-11-26 DIAGNOSIS — Z3200 Encounter for pregnancy test, result unknown: Secondary | ICD-10-CM | POA: Diagnosis not present

## 2023-11-26 DIAGNOSIS — R0602 Shortness of breath: Secondary | ICD-10-CM | POA: Diagnosis not present

## 2023-11-26 DIAGNOSIS — R5383 Other fatigue: Secondary | ICD-10-CM | POA: Diagnosis not present

## 2023-11-26 DIAGNOSIS — E88819 Insulin resistance, unspecified: Secondary | ICD-10-CM | POA: Diagnosis not present

## 2023-11-26 DIAGNOSIS — E7800 Pure hypercholesterolemia, unspecified: Secondary | ICD-10-CM | POA: Diagnosis not present

## 2023-11-26 DIAGNOSIS — E119 Type 2 diabetes mellitus without complications: Secondary | ICD-10-CM | POA: Diagnosis not present

## 2023-11-26 DIAGNOSIS — E559 Vitamin D deficiency, unspecified: Secondary | ICD-10-CM | POA: Diagnosis not present

## 2023-12-02 ENCOUNTER — Other Ambulatory Visit: Payer: Self-pay

## 2023-12-05 ENCOUNTER — Inpatient Hospital Stay: Admission: RE | Admit: 2023-12-05 | Source: Ambulatory Visit

## 2023-12-05 ENCOUNTER — Encounter

## 2023-12-15 ENCOUNTER — Other Ambulatory Visit: Payer: Self-pay

## 2023-12-15 MED FILL — Metformin HCl Tab 500 MG: ORAL | 30 days supply | Qty: 120 | Fill #4 | Status: AC

## 2023-12-16 ENCOUNTER — Encounter

## 2023-12-16 ENCOUNTER — Other Ambulatory Visit

## 2023-12-17 ENCOUNTER — Other Ambulatory Visit: Payer: Self-pay

## 2023-12-17 DIAGNOSIS — M25562 Pain in left knee: Secondary | ICD-10-CM | POA: Diagnosis not present

## 2023-12-17 DIAGNOSIS — M25561 Pain in right knee: Secondary | ICD-10-CM | POA: Diagnosis not present

## 2023-12-17 DIAGNOSIS — I10 Essential (primary) hypertension: Secondary | ICD-10-CM | POA: Diagnosis not present

## 2023-12-17 DIAGNOSIS — Z79899 Other long term (current) drug therapy: Secondary | ICD-10-CM | POA: Diagnosis not present

## 2023-12-17 DIAGNOSIS — E119 Type 2 diabetes mellitus without complications: Secondary | ICD-10-CM | POA: Diagnosis not present

## 2023-12-17 DIAGNOSIS — E559 Vitamin D deficiency, unspecified: Secondary | ICD-10-CM | POA: Diagnosis not present

## 2023-12-17 DIAGNOSIS — G8929 Other chronic pain: Secondary | ICD-10-CM | POA: Diagnosis not present

## 2023-12-17 DIAGNOSIS — Z6841 Body Mass Index (BMI) 40.0 and over, adult: Secondary | ICD-10-CM | POA: Diagnosis not present

## 2023-12-17 DIAGNOSIS — N914 Secondary oligomenorrhea: Secondary | ICD-10-CM | POA: Diagnosis not present

## 2023-12-19 DIAGNOSIS — Z79899 Other long term (current) drug therapy: Secondary | ICD-10-CM | POA: Diagnosis not present

## 2023-12-23 ENCOUNTER — Other Ambulatory Visit

## 2023-12-23 ENCOUNTER — Encounter

## 2024-01-30 ENCOUNTER — Telehealth (HOSPITAL_COMMUNITY): Payer: Self-pay

## 2024-01-30 ENCOUNTER — Other Ambulatory Visit: Payer: Self-pay

## 2024-01-30 ENCOUNTER — Ambulatory Visit (HOSPITAL_COMMUNITY)
Admission: EM | Admit: 2024-01-30 | Discharge: 2024-01-30 | Disposition: A | Discharge status: Home / Self Care | Attending: Family Medicine | Admitting: Family Medicine

## 2024-01-30 ENCOUNTER — Encounter (HOSPITAL_COMMUNITY): Payer: Self-pay

## 2024-01-30 ENCOUNTER — Ambulatory Visit: Payer: Self-pay

## 2024-01-30 DIAGNOSIS — I10 Essential (primary) hypertension: Secondary | ICD-10-CM

## 2024-01-30 DIAGNOSIS — J019 Acute sinusitis, unspecified: Secondary | ICD-10-CM

## 2024-01-30 DIAGNOSIS — J4521 Mild intermittent asthma with (acute) exacerbation: Secondary | ICD-10-CM

## 2024-01-30 MED ORDER — BENZONATATE 100 MG PO CAPS
100.0000 mg | ORAL_CAPSULE | Freq: Three times a day (TID) | ORAL | 0 refills | Status: AC | PRN
  Filled 2024-01-30: qty 21, 7d supply, fill #0

## 2024-01-30 MED ORDER — PREDNISONE 20 MG PO TABS: 40.0000 mg | ORAL_TABLET | Freq: Every day | ORAL | 0 refills | Status: DC

## 2024-01-30 MED ORDER — CEFDINIR 300 MG PO CAPS: 600.0000 mg | ORAL_CAPSULE | Freq: Every day | ORAL | 0 refills | Status: DC

## 2024-01-30 MED ORDER — BENZONATATE 100 MG PO CAPS: 100.0000 mg | ORAL_CAPSULE | Freq: Three times a day (TID) | ORAL | 0 refills | Status: DC | PRN

## 2024-01-30 MED ORDER — PREDNISONE 20 MG PO TABS
40.0000 mg | ORAL_TABLET | Freq: Every day | ORAL | 0 refills | Status: AC
  Filled 2024-01-30: qty 10, 5d supply, fill #0

## 2024-01-30 MED ORDER — CEFDINIR 300 MG PO CAPS
600.0000 mg | ORAL_CAPSULE | Freq: Every day | ORAL | 0 refills | Status: AC
  Filled 2024-01-30: qty 14, 7d supply, fill #0

## 2024-01-30 NOTE — Telephone Encounter (Signed)
 Patient requesting pharmacy change.

## 2024-01-30 NOTE — Discharge Instructions (Signed)
 Take cefdinir  300 mg--2 capsules together daily for 7 days  Take prednisone  20 mg--2 daily for 5 days; this medication.  Your sugars elevate some.  Drink plenty of fluids and be extra good on your diet  Take benzonatate  100 mg, 1 tab every 8 hours as needed for cough.  Your blood pressure here was 149/86 and on your cuff on the other arm was 151/112.  It does seem that your cuff that you have for home use is not accurate.  Follow-up with your primary care about this issue

## 2024-01-30 NOTE — Telephone Encounter (Signed)
 FYI  Patient states that she is going to urgent care.

## 2024-01-30 NOTE — Telephone Encounter (Signed)
 FYI Only or Action Required?: FYI only for provider.  Patient was last seen in primary care on 08/18/2023 by Vicci Barnie NOVAK, MD.  Called Nurse Triage reporting Hypertension. Also had HA yesterday. Pt is recovering from a cold.   Symptoms began yesterday.  Interventions attempted: Prescription medications: As prescribed.  Symptoms are: unchanged.  Triage Disposition: See Physician Within 24 Hours  Patient/caregiver understands and will follow disposition?: Yes - pt will go to UC. She is unsure if her BP machine is working properly.                  Copied from CRM [id number]. Topic: Clinical - Red Word Triage >> Jan 30, 2024 11:42 AM Edsel HERO wrote: Headache, high blood pressure 151/118. Reason for Disposition  Systolic BP >= 180 OR Diastolic >= 110  Answer Assessment - Initial Assessment Questions 1. BLOOD PRESSURE: What is your blood pressure? Did you take at least two measurements 5 minutes apart?     151/118 just now. Last night 151/114, 161/116,  2. ONSET: When did you take your blood pressure?     A few minutes ago 3. HOW: How did you take your blood pressure? (e.g., automatic home BP monitor, visiting nurse)     automatic 4. HISTORY: Do you have a history of high blood pressure?     yes 5. MEDICINES: Are you taking any medicines for blood pressure? Have you missed any doses recently?     Yes - no has not missed 6. OTHER SYMPTOMS: Do you have any symptoms? (e.g., blurred vision, chest pain, difficulty breathing, headache, weakness)     Headache - feeling sluggish yesterday 7. PREGNANCY: Is there any chance you are pregnant? When was your last menstrual period?     no  Protocols used: Blood Pressure - High-A-AH

## 2024-01-30 NOTE — ED Triage Notes (Signed)
 Patient reports that she  she began having  a headache yesterday. Patient states he took her BP last night and it was 151/116 and after resting it increased to 161/118.  Today, the patient reports that she continues to have a headache and BP was 140/93 and a retake BP of 161/118. Patient states she takes Losartan  during the day and hydrochlorothiazide  at night. BP this AM was 151/116 and retake was 161/118.  Patient also c/o a productive cough with yellow and green sputum, fatigue, and nasal congestion x 1 week. Patient added that her ears feels like a balloon  in my ears.  Patient states she has been taking Alka Seltzer Cold, Tylenol , Ibuprofen , Albuterol  inhaler, and BC Powder.

## 2024-01-30 NOTE — ED Provider Notes (Signed)
 [facility]    CSN: [id number] Arrival date & time: 01/30/24  1255      History   Chief Complaint Chief Complaint  Patient presents with   Cough   Headache   Hypertension   Fatigue    HPI Crystal Burns is a 43 y.o. female.    Cough Associated symptoms: headaches   Headache Associated symptoms: cough   Hypertension Associated symptoms include headaches.   Here for 1 week h/o cough, nasal congestion, postnasal dc, and h/a. No f/c. Her asthma has been bothering her more. No n/v/d.  Last night her h/a was worse, and BP on her cuff at home was very elevated, 161/118. She takes hydrochlorothiazide  every evening and losartan  in the evening. After she took her losartan  this AM, her bp was still up by that bp cuff.   NKDA except for shellfish.  She does have diabetes and control has been very good. Past Medical History:  Diagnosis Date   Asthma    2010-only bothers pt during bad cold   Depression    2010-had when mother passed away   History of gonorrhea    Hypertension    2004   Obesity    Pre-diabetes    Preeclampsia complicating hypertension 01/21/2015    Patient Active Problem List   Diagnosis Date Noted   Bilateral primary osteoarthritis of knee 02/28/2020   Precordial chest pain 05/03/2019   Educated about COVID-19 virus infection 05/03/2019   Hoarseness of voice 03/17/2018   Gastroesophageal reflux disease without esophagitis 03/17/2018   Supervision of normal pregnancy, antepartum 12/15/2017   Acute medial meniscus tear of right knee 02/10/2017   Vitamin D  deficiency 07/10/2015   Anxiety 06/20/2015   Costochondritis 05/18/2015   Primary osteoarthritis of right knee 04/22/2014   Essential hypertension 07/18/2012   Sprain of ligament of lumbosacral joint 07/05/2008   Morbid obesity (HCC) 02/24/2008   Asthma with exacerbation 02/24/2008    Past Surgical History:  Procedure Laterality Date   CHOLECYSTECTOMY     DILATION AND  CURETTAGE OF UTERUS     WISDOM TOOTH EXTRACTION      OB History     Gravida  11   Para  7   Term  7   Preterm  0   AB  4   Living  6      SAB  4   IAB  0   Ectopic  0   Multiple  0   Live Births  7            Home Medications    Prior to Admission medications   Medication Sig Start Date End Date Taking? Authorizing Provider  benzonatate  (TESSALON ) 100 MG capsule Take 1 capsule (100 mg total) by mouth 3 (three) times daily as needed for cough. 01/30/24  Yes Vonna Sharlet POUR, MD  cefdinir  (OMNICEF ) 300 MG capsule Take 2 capsules (600 mg total) by mouth daily for 7 days. 01/30/24 02/06/24 Yes Vonna Sharlet POUR, MD  predniSONE  (DELTASONE ) 20 MG tablet Take 2 tablets (40 mg total) by mouth daily with breakfast for 5 days. 01/30/24 02/04/24 Yes Vonna Sharlet POUR, MD  albuterol  (PROVENTIL ) (2.5 MG/3ML) 0.083% nebulizer solution Take 3 mLs (2.5 mg total) by nebulization every 6 (six) hours as needed for wheezing or shortness of breath. 02/06/23   Newlin, Enobong, MD  albuterol  (PROVENTIL ) (2.5 MG/3ML) 0.083% nebulizer solution Take 3 mLs (2.5 mg total) by nebulization every 4 (four) hours as needed for wheezing or  shortness of breath. 05/12/23   Vonna Sharlet POUR, MD  albuterol  (VENTOLIN  HFA) 108 [phone] Base) MCG/ACT inhaler Inhale 2 puffs into the lungs every 4 (four) hours as needed for wheezing or shortness of breath (Cough). 11/19/22   Van Knee, MD  fluticasone  (FLONASE ) 50 MCG/ACT nasal spray Place 1 spray into both nostrils daily. 01/17/22   Raspet, Erin K, PA-C  hydrochlorothiazide  (HYDRODIURIL ) 25 MG tablet Take 1 tablet (25 mg total) by mouth daily. 09/02/23   Newlin, Enobong, MD  Iron , Ferrous Sulfate , 325 (65 Fe) MG TABS Take 1 tablet by mouth daily. 10/31/21   Newlin, Enobong, MD  losartan  (COZAAR ) 50 MG tablet Take 1 tablet (50 mg total) by mouth daily. 09/02/23 09/01/24  Newlin, Enobong, MD  metFORMIN  (GLUCOPHAGE ) 500 MG tablet Take 2 tablets (1,000 mg  total) by mouth 2 (two) times daily with a meal. 02/21/23   Delbert Clam, MD  pravastatin  (PRAVACHOL ) 20 MG tablet Take 1 tablet (20 mg total) by mouth daily. 10/08/23   Newlin, Enobong, MD  Spacer/Aero-Holding Chambers (AEROCHAMBER MV) inhaler Use as instructed 11/19/22   Van Knee, MD  traMADol  (ULTRAM ) 50 MG tablet Take 1-2 tablets (50-100 mg total) by mouth every 12 (twelve) hours as needed. 05/22/23   Jule Ronal CROME, PA-C    Family History Family History  Problem Relation Age of Onset   Breast cancer Mother    Hypertension Mother    Diabetes Mother    Cancer Mother        Breast    Heart attack Mother 17   Hypertension Father    Diabetes Father    Hyperlipidemia Father    Arthritis Other    Asthma Other    Breast cancer Other    Heart failure Other    Congenital heart disease Other    Depression Other    Heart attack Other     Social History Social History   Tobacco Use   Smoking status: Former    Current packs/day: 0.00    Types: Cigarettes    Quit date: 10/31/2010    Years since quitting: 13.2   Smokeless tobacco: Never  Vaping Use   Vaping status: Never Used  Substance Use Topics   Alcohol use: No    Alcohol/week: 0.0 standard drinks of alcohol   Drug use: No     Allergies   Bee venom, Lisinopril , and Shellfish allergy   Review of Systems Review of Systems  Respiratory:  Positive for cough.   Neurological:  Positive for headaches.     Physical Exam Triage Vital Signs ED Triage Vitals  Encounter Vitals Group     BP 01/30/24 1412 (!) 149/86     Girls Systolic BP Percentile --      Girls Diastolic BP Percentile --      Boys Systolic BP Percentile --      Boys Diastolic BP Percentile --      Pulse Rate 01/30/24 1412 80     Resp 01/30/24 1412 16     Temp 01/30/24 1412 98.1 F (36.7 C)     Temp Source 01/30/24 1412 Oral     SpO2 01/30/24 1412 98 %     Weight --      Height --      Head Circumference --      Peak Flow --      Pain  Score 01/30/24 1411 6     Pain Loc --      Pain Education --  Exclude from Growth Chart --    No data found.  Updated Vital Signs BP (!) 149/86 (BP Location: Left Arm)   Pulse 80   Temp 98.1 F (36.7 C) (Oral)   Resp 16   SpO2 98%   Visual Acuity Right Eye Distance:   Left Eye Distance:   Bilateral Distance:    Right Eye Near:   Left Eye Near:    Bilateral Near:     Physical Exam Vitals reviewed.  Constitutional:      General: She is not in acute distress.    Appearance: She is not toxic-appearing.  HENT:     Right Ear: Tympanic membrane and ear canal normal.     Left Ear: Tympanic membrane and ear canal normal.     Nose: Congestion present.     Mouth/Throat:     Mouth: Mucous membranes are moist.     Comments: There is mild erythema of the posterior oropharynx and clear exudate is draining in the posterior oropharynx. Eyes:     Extraocular Movements: Extraocular movements intact.     Conjunctiva/sclera: Conjunctivae normal.     Pupils: Pupils are equal, round, and reactive to light.  Cardiovascular:     Rate and Rhythm: Normal rate and regular rhythm.     Heart sounds: No murmur heard. Pulmonary:     Effort: No respiratory distress.     Breath sounds: No stridor. No wheezing, rhonchi or rales.  Musculoskeletal:     Cervical back: Neck supple.  Lymphadenopathy:     Cervical: No cervical adenopathy.  Skin:    Capillary Refill: Capillary refill takes less than 2 seconds.     Coloration: Skin is not jaundiced or pale.  Neurological:     General: No focal deficit present.     Mental Status: She is alert and oriented to person, place, and time.  Psychiatric:        Behavior: Behavior normal.      UC Treatments / Results  Labs (all labs ordered are listed, but only abnormal results are displayed) Labs Reviewed - No data to display  EKG   Radiology No results found.  Procedures Procedures (including critical care time)  Medications Ordered in  UC Medications - No data to display  Initial Impression / Assessment and Plan / UC Course  I have reviewed the triage vital signs and the nursing notes.  Pertinent labs & imaging results that were available during my care of the patient were reviewed by me and considered in my medical decision making (see chart for details).     Blood pressure on our cuff here was 149/86, and on her cuff at the same time on another arm it was 156/112.  The patient I have discussed and we think that her cuff is not accurate.  Prednisone  is sent in for the asthma exacerbation and she is warned that might make her sugars elevate some. Omnicef  is sent in for sinus infection. Tessalon  Perles are sent in for cough   Final Clinical Impressions(s) / UC Diagnoses   Final diagnoses:  Mild intermittent asthma with (acute) exacerbation  Acute sinusitis, recurrence not specified, unspecified location  Essential hypertension     Discharge Instructions      Take cefdinir  300 mg--2 capsules together daily for 7 days  Take prednisone  20 mg--2 daily for 5 days; this medication.  Your sugars elevate some.  Drink plenty of fluids and be extra good on your diet  Take benzonatate  100 mg, 1  tab every 8 hours as needed for cough.  Your blood pressure here was 149/86 and on your cuff on the other arm was 151/112.  It does seem that your cuff that you have for home use is not accurate.  Follow-up with your primary care about this issue     ED Prescriptions     Medication Sig Dispense Auth. Provider   predniSONE  (DELTASONE ) 20 MG tablet Take 2 tablets (40 mg total) by mouth daily with breakfast for 5 days. 10 tablet Vonna Sharlet POUR, MD   cefdinir  (OMNICEF ) 300 MG capsule Take 2 capsules (600 mg total) by mouth daily for 7 days. 14 capsule Rogen Porte K, MD   benzonatate  (TESSALON ) 100 MG capsule Take 1 capsule (100 mg total) by mouth 3 (three) times daily as needed for cough. 21 capsule Orenthal Debski  K, MD      PDMP not reviewed this encounter.   Vonna Sharlet POUR, MD 01/30/24 [phone]

## 2024-02-05 ENCOUNTER — Ambulatory Visit: Payer: Self-pay

## 2024-02-05 NOTE — Telephone Encounter (Signed)
 FYI Only or Action Required?: Action required by provider: clinical question for provider and update on patient condition.  Patient was last seen in primary care on 08/18/2023 by Vicci Barnie NOVAK, MD.  Called Nurse Triage reporting Hypertension.  Symptoms began a week ago.  Interventions attempted: Prescription medications: taking blood pressure medication.  Symptoms are: unchanged.  Triage Disposition: See Physician Within 24 Hours  Patient/caregiver understands and will follow disposition?: Yes      Copied from CRM #[id number]. Topic: Clinical - Red Word Triage >> Feb 05, 2024  9:30 AM Darshell M wrote: Red Word that prompted transfer to Nurse Triage: Patient BP 148/113, yesterday it was 154/113.         Reason for Disposition  Systolic BP >= 180 OR Diastolic >= 110  Answer Assessment - Initial Assessment Questions Patient has an appointment tomorrow at an alternative office due to no appointments available in the office until 12/08. Patient wanted to know if Dr. Delbert would like to make any adjustments to her blood pressure medication as well. Please advise.     1. BLOOD PRESSURE: What is your blood pressure? Did you take at least two measurements 5 minutes apart?     148/111 today, 154/113 yesterday  2. ONSET: When did you take your blood pressure?      Has been high for a week 3. HOW: How did you take your blood pressure? (e.g., automatic home BP monitor, visiting nurse)     Automatic BP cuff  4. HISTORY: Do you have a history of high blood pressure?     Yes 5. MEDICINES: Are you taking any medicines for blood pressure? Have you missed any doses recently?     Has not missed any doses of medication  6. OTHER SYMPTOMS: Do you have any symptoms? (e.g., blurred vision, chest pain, difficulty breathing, headache, weakness)     Headache and dizziness yesterday  Protocols used: Blood Pressure - High-A-AH

## 2024-02-05 NOTE — Telephone Encounter (Signed)
 Noted

## 2024-02-06 ENCOUNTER — Ambulatory Visit: Payer: Self-pay | Admitting: Nurse Practitioner

## 2024-02-06 ENCOUNTER — Encounter: Payer: Self-pay | Admitting: Nurse Practitioner

## 2024-02-06 ENCOUNTER — Other Ambulatory Visit: Payer: Self-pay

## 2024-02-06 VITALS — BP 132/89 | HR 72 | Wt 313.0 lb

## 2024-02-06 DIAGNOSIS — J019 Acute sinusitis, unspecified: Secondary | ICD-10-CM | POA: Diagnosis not present

## 2024-02-06 DIAGNOSIS — I10 Essential (primary) hypertension: Secondary | ICD-10-CM | POA: Diagnosis not present

## 2024-02-06 DIAGNOSIS — J45901 Unspecified asthma with (acute) exacerbation: Secondary | ICD-10-CM

## 2024-02-06 DIAGNOSIS — R7309 Other abnormal glucose: Secondary | ICD-10-CM | POA: Diagnosis not present

## 2024-02-06 MED ORDER — LOSARTAN POTASSIUM 100 MG PO TABS
100.0000 mg | ORAL_TABLET | Freq: Every day | ORAL | 1 refills | Status: AC
  Filled 2024-02-06: qty 60, 60d supply, fill #0
  Filled 2024-04-09 (×2): qty 60, 60d supply, fill #1

## 2024-02-06 NOTE — Patient Instructions (Addendum)
 Around 3 times per week, check your blood pressure 2 times per day. once in the morning and once in the evening. The readings should be at least one minute apart. Write down these values and bring them to your next nurse visit/appointment.  When you check your BP, make sure you have been doing something calm/relaxing 5 minutes prior to checking. Both feet should be flat on the floor and you should be sitting. Use your left arm and make sure it is in a relaxed position (on a table), and that the cuff is at the approximate level/height of your heart. Blood pressure gaol is less than 130/80  1. Essential hypertension (Primary)  - losartan  (COZAAR ) 100 MG tablet; Take 1 tablet (100 mg total) by mouth daily.  Dispense: 60 tablet; Refill: 1 - Basic Metabolic Panel; Future    It is important that you exercise regularly at least 30 minutes 5 times a week as tolerated  Think about what you will eat, plan ahead. Choose  clean, green, fresh or frozen over canned, processed or packaged foods which are more sugary, salty and fatty. 70 to 75% of food eaten should be vegetables and fruit. Three meals at set times with snacks allowed between meals, but they must be fruit or vegetables. Aim to eat over a 12 hour period , example 7 am to 7 pm, and STOP after  your last meal of the day. Drink water,generally about 64 ounces per day, no other drink is as healthy. Fruit juice is best enjoyed in a healthy way, by EATING the fruit.  Thanks for choosing [facility] we consider it a privelige to serve you.

## 2024-02-06 NOTE — Assessment & Plan Note (Addendum)
 Start losartan  100 mg daily continue hydrochlorothiazide  12.5 mg daily Blood pressure goal is less than 130/80, encouraged to continue to monitor blood pressure at home BMP in 2 weeks Follow-up with PCP in 6 weeks DASH diet and commitment to daily physical activity for a minimum of 30 minutes discussed and encouraged, as a part of hypertension management. The importance of attaining a healthy weight is also discussed.     02/06/2024   12:08 PM 02/06/2024   11:38 AM 01/30/2024    2:12 PM 08/18/2023    4:15 PM 05/12/2023   11:33 AM 03/07/2023    3:28 PM 02/06/2023    9:44 AM  BP/Weight  Systolic BP 132 136 149 125 134 132 124  Diastolic BP 89 88 86 86 94 82 85  Wt. (Lbs)  313  321   318.8  BMI  55.45 kg/m2  56.86 kg/m2   56.47 kg/m2

## 2024-02-06 NOTE — Progress Notes (Signed)
 Acute Office Visit  Subjective:     Patient ID: Crystal Burns, female    DOB: 1980-11-25, 43 y.o.   MRN: [id number]  Chief Complaint  Patient presents with   Hypertension    HPI   Discussed the use of AI scribe software for clinical note transcription with the patient, who gave verbal consent to proceed.  History of Present Illness Crystal Burns is a 43 year old female with past medical history of obesity, hypertension, type 2 diabetes, hyperlipidemia, asthma who presents with concerns about elevated blood pressure readings.  She has been experiencing elevated blood pressure readings at home, with her machine showing 154/111, while readings at urgent care were 149/80. She suspects her home machine may be inaccurate due to its age and possibly needing a battery change. She attributes some of the elevation to stress and a recent sinus infection, for which she was treated with antibiotics. She is concerned that over-the-counter medications may have contributed to the elevated readings.  She is currently taking hydrochlorothiazide  25 mg daily and losartan  50 mg daily for hypertension. She usually takes losartan  around 11 or 12 o'clock in the day and hydrochlorothiazide  at night around 7 or 8 o'clock. She has not taken her medications today as she has not eaten yet.  She has a history of diabetes and is taking metformin  1000 mg twice a day and Ozempic 0.25 mg weekly, which she started seven weeks ago. She recently had her A1c checked at [facility], but she cannot recall the exact number. She visits [facility] for weight loss and pain management.  Stated that she had labs done at [facility] 7 weeks ago      Assessment & Plan      Review of Systems  Constitutional:  Negative for appetite change, chills, fatigue and fever.  HENT:  Negative for congestion, postnasal drip, rhinorrhea and sneezing.   Respiratory:  Negative for cough, shortness of breath and  wheezing.   Cardiovascular:  Negative for chest pain, palpitations and leg swelling.  Gastrointestinal:  Negative for abdominal pain, constipation, nausea and vomiting.  Genitourinary:  Negative for difficulty urinating, dysuria, flank pain and frequency.  Musculoskeletal:  Negative for arthralgias, back pain, joint swelling and myalgias.  Skin:  Negative for color change, pallor, rash and wound.  Neurological:  Negative for dizziness, facial asymmetry, weakness, numbness and headaches.  Psychiatric/Behavioral:  Negative for behavioral problems, confusion, self-injury and suicidal ideas.         Objective:    BP 132/89   Pulse 72   Wt (!) 313 lb (142 kg)   SpO2 100%   BMI 55.45 kg/m    Physical Exam Vitals and nursing note reviewed.  Constitutional:      General: She is not in acute distress.    Appearance: Normal appearance. She is obese. She is not ill-appearing, toxic-appearing or diaphoretic.  Eyes:     General: No scleral icterus.       Right eye: No discharge.        Left eye: No discharge.     Extraocular Movements: Extraocular movements intact.     Conjunctiva/sclera: Conjunctivae normal.  Cardiovascular:     Rate and Rhythm: Normal rate and regular rhythm.     Pulses: Normal pulses.     Heart sounds: Normal heart sounds. No murmur heard.    No friction rub. No gallop.  Pulmonary:     Effort: Pulmonary effort is normal. No respiratory distress.  Breath sounds: Normal breath sounds. No stridor. No wheezing, rhonchi or rales.  Chest:     Chest wall: No tenderness.  Abdominal:     General: There is no distension.     Palpations: Abdomen is soft.     Tenderness: There is no abdominal tenderness. There is no right CVA tenderness, left CVA tenderness or guarding.  Musculoskeletal:        General: No swelling, tenderness, deformity or signs of injury.     Right lower leg: No edema.     Left lower leg: No edema.  Skin:    General: Skin is warm and dry.      Capillary Refill: Capillary refill takes less than 2 seconds.     Coloration: Skin is not jaundiced or pale.     Findings: No bruising, erythema or lesion.  Neurological:     Mental Status: She is alert and oriented to person, place, and time.     Motor: No weakness.     Gait: Gait normal.  Psychiatric:        Mood and Affect: Mood normal.        Behavior: Behavior normal.        Thought Content: Thought content normal.        Judgment: Judgment normal.     No results found for any visits on 02/06/24.      Assessment & Plan:   Problem List Items Addressed This Visit       Cardiovascular and Mediastinum   Essential hypertension - Primary   Start losartan  100 mg daily continue hydrochlorothiazide  12.5 mg daily Blood pressure goal is less than 130/80, encouraged to continue to monitor blood pressure at home BMP in 2 weeks Follow-up with PCP in 6 weeks DASH diet and commitment to daily physical activity for a minimum of 30 minutes discussed and encouraged, as a part of hypertension management. The importance of attaining a healthy weight is also discussed.     02/06/2024   12:08 PM 02/06/2024   11:38 AM 01/30/2024    2:12 PM 08/18/2023    4:15 PM 05/12/2023   11:33 AM 03/07/2023    3:28 PM 02/06/2023    9:44 AM  BP/Weight  Systolic BP 132 136 149 125 134 132 124  Diastolic BP 89 88 86 86 94 82 85  Wt. (Lbs)  313  321   318.8  BMI  55.45 kg/m2  56.86 kg/m2   56.47 kg/m2           Relevant Medications   losartan  (COZAAR ) 100 MG tablet   Other Relevant Orders   Basic Metabolic Panel     Respiratory   Asthma with exacerbation   Recently treated with prednisone  Continue albuterol  inhaler 2 puffs every 4 hours as needed, albuterol  nebulizer 2.5 mg every 4 hours as needed       Acute sinusitis   Doing well today  Has completed full course of antibiotics ordered          Other   Hemoglobin A1c less than 7.0%   Managed with metformin  1000 mg twice daily and  Ozempic.  0.25 mg once weekly A1c checked at [facility]; value unknown. - Request A1c and other lab results from [facility] to be faxed to primary care office. - Consider increasing Ozempic dose for weight loss if appropriate.         Meds ordered this encounter  Medications   losartan  (COZAAR ) 100 MG tablet  Sig: Take 1 tablet (100 mg total) by mouth daily.    Dispense:  60 tablet    Refill:  1    No follow-ups on file.  Benyamin Jeff R Emery Dupuy, FNP

## 2024-02-06 NOTE — Assessment & Plan Note (Addendum)
 Recently treated with prednisone  Continue albuterol  inhaler 2 puffs every 4 hours as needed, albuterol  nebulizer 2.5 mg every 4 hours as needed

## 2024-02-06 NOTE — Assessment & Plan Note (Signed)
 Doing well today  Has completed full course of antibiotics ordered

## 2024-02-06 NOTE — Assessment & Plan Note (Signed)
 Managed with metformin  1000 mg twice daily and Ozempic.  0.25 mg once weekly A1c checked at [facility]; value unknown. - Request A1c and other lab results from [facility] to be faxed to primary care office. - Consider increasing Ozempic dose for weight loss if appropriate.

## 2024-02-09 ENCOUNTER — Other Ambulatory Visit: Payer: Self-pay

## 2024-02-09 ENCOUNTER — Encounter: Payer: Self-pay | Admitting: Radiology

## 2024-02-17 ENCOUNTER — Other Ambulatory Visit: Payer: Self-pay

## 2024-02-17 DIAGNOSIS — I10 Essential (primary) hypertension: Secondary | ICD-10-CM

## 2024-02-17 MED ORDER — BLOOD PRESSURE KIT: 1.0000 | PACK | 0 refills | Status: AC

## 2024-02-20 ENCOUNTER — Other Ambulatory Visit: Payer: Self-pay

## 2024-02-20 ENCOUNTER — Other Ambulatory Visit: Payer: Self-pay | Admitting: Family Medicine

## 2024-02-20 DIAGNOSIS — I10 Essential (primary) hypertension: Secondary | ICD-10-CM

## 2024-02-20 MED FILL — Metformin HCl Tab 500 MG: ORAL | 30 days supply | Qty: 120 | Fill #5 | Status: AC

## 2024-02-22 MED ORDER — HYDROCHLOROTHIAZIDE 25 MG PO TABS
25.0000 mg | ORAL_TABLET | Freq: Every day | ORAL | 0 refills | Status: AC
  Filled 2024-02-22 – 2024-04-27 (×4): qty 30, 30d supply, fill #0

## 2024-02-23 ENCOUNTER — Ambulatory Visit

## 2024-02-23 ENCOUNTER — Other Ambulatory Visit: Payer: Self-pay

## 2024-02-25 DIAGNOSIS — I10 Essential (primary) hypertension: Secondary | ICD-10-CM | POA: Diagnosis not present

## 2024-03-02 ENCOUNTER — Other Ambulatory Visit: Payer: Self-pay

## 2024-03-03 ENCOUNTER — Other Ambulatory Visit: Payer: Self-pay

## 2024-03-11 DIAGNOSIS — M25562 Pain in left knee: Secondary | ICD-10-CM | POA: Diagnosis not present

## 2024-03-11 DIAGNOSIS — G8929 Other chronic pain: Secondary | ICD-10-CM | POA: Diagnosis not present

## 2024-03-11 DIAGNOSIS — Z6841 Body Mass Index (BMI) 40.0 and over, adult: Secondary | ICD-10-CM | POA: Diagnosis not present

## 2024-03-11 DIAGNOSIS — Z79899 Other long term (current) drug therapy: Secondary | ICD-10-CM | POA: Diagnosis not present

## 2024-03-11 DIAGNOSIS — M25561 Pain in right knee: Secondary | ICD-10-CM | POA: Diagnosis not present

## 2024-03-11 DIAGNOSIS — N914 Secondary oligomenorrhea: Secondary | ICD-10-CM | POA: Diagnosis not present

## 2024-03-15 DIAGNOSIS — Z79899 Other long term (current) drug therapy: Secondary | ICD-10-CM | POA: Diagnosis not present

## 2024-03-27 DIAGNOSIS — Z6841 Body Mass Index (BMI) 40.0 and over, adult: Secondary | ICD-10-CM | POA: Diagnosis not present

## 2024-03-27 DIAGNOSIS — R5383 Other fatigue: Secondary | ICD-10-CM | POA: Diagnosis not present

## 2024-03-27 DIAGNOSIS — Z3200 Encounter for pregnancy test, result unknown: Secondary | ICD-10-CM | POA: Diagnosis not present

## 2024-03-27 DIAGNOSIS — E119 Type 2 diabetes mellitus without complications: Secondary | ICD-10-CM | POA: Diagnosis not present

## 2024-04-09 ENCOUNTER — Other Ambulatory Visit: Payer: Self-pay

## 2024-04-09 ENCOUNTER — Other Ambulatory Visit: Payer: Self-pay | Admitting: Family Medicine

## 2024-04-09 MED ORDER — METFORMIN HCL 500 MG PO TABS
1000.0000 mg | ORAL_TABLET | Freq: Two times a day (BID) | ORAL | 0 refills | Status: AC
  Filled 2024-04-09 – 2024-04-27 (×2): qty 120, 30d supply, fill #0

## 2024-04-12 ENCOUNTER — Ambulatory Visit: Admitting: Physician Assistant

## 2024-04-20 ENCOUNTER — Other Ambulatory Visit: Payer: Self-pay

## 2024-04-27 ENCOUNTER — Other Ambulatory Visit: Payer: Self-pay
